# Patient Record
Sex: Female | Born: 1940 | Race: White | Hispanic: No | Smoking: Never smoker
Health system: Southern US, Community
[De-identification: ages and names within clinical notes are randomized; demographics above are authoritative.]

## PROBLEM LIST (undated history)

## (undated) DIAGNOSIS — E785 Hyperlipidemia, unspecified: Secondary | ICD-10-CM

## (undated) DIAGNOSIS — N952 Postmenopausal atrophic vaginitis: Secondary | ICD-10-CM

## (undated) DIAGNOSIS — Z86010 Personal history of colonic polyps: Secondary | ICD-10-CM

## (undated) DIAGNOSIS — D049 Carcinoma in situ of skin, unspecified: Secondary | ICD-10-CM

## (undated) DIAGNOSIS — F419 Anxiety disorder, unspecified: Secondary | ICD-10-CM

## (undated) DIAGNOSIS — M1990 Unspecified osteoarthritis, unspecified site: Secondary | ICD-10-CM

## (undated) DIAGNOSIS — M545 Low back pain: Secondary | ICD-10-CM

## (undated) DIAGNOSIS — J309 Allergic rhinitis, unspecified: Secondary | ICD-10-CM

## (undated) DIAGNOSIS — D039 Melanoma in situ, unspecified: Secondary | ICD-10-CM

## (undated) DIAGNOSIS — I10 Essential (primary) hypertension: Secondary | ICD-10-CM

## (undated) DIAGNOSIS — I639 Cerebral infarction, unspecified: Secondary | ICD-10-CM

## (undated) DIAGNOSIS — E559 Vitamin D deficiency, unspecified: Secondary | ICD-10-CM

## (undated) HISTORY — PX: BACK SURGERY: SHX140

## (undated) HISTORY — DX: Melanoma in situ, unspecified: D03.9

## (undated) HISTORY — DX: Postmenopausal atrophic vaginitis: N95.2

## (undated) HISTORY — DX: Vitamin D deficiency, unspecified: E55.9

## (undated) HISTORY — DX: Personal history of colonic polyps: Z86.010

## (undated) HISTORY — DX: Cerebral infarction, unspecified: I63.9

## (undated) HISTORY — DX: Anxiety disorder, unspecified: F41.9

## (undated) HISTORY — DX: Allergic rhinitis, unspecified: J30.9

## (undated) HISTORY — DX: Low back pain: M54.5

## (undated) HISTORY — DX: Hyperlipidemia, unspecified: E78.5

## (undated) HISTORY — DX: Carcinoma in situ of skin, unspecified: D04.9

---

## 1988-01-13 HISTORY — PX: OTHER SURGICAL HISTORY: SHX169

## 2004-01-13 HISTORY — PX: CHOLECYSTECTOMY: SHX55

## 2004-09-22 ENCOUNTER — Ambulatory Visit: Payer: Self-pay | Admitting: Family Medicine

## 2004-09-30 ENCOUNTER — Ambulatory Visit: Payer: Self-pay | Admitting: Family Medicine

## 2004-10-06 ENCOUNTER — Ambulatory Visit: Payer: Self-pay | Admitting: Internal Medicine

## 2004-11-07 ENCOUNTER — Ambulatory Visit: Payer: Self-pay | Admitting: Internal Medicine

## 2004-11-07 ENCOUNTER — Encounter (INDEPENDENT_AMBULATORY_CARE_PROVIDER_SITE_OTHER): Payer: Self-pay | Admitting: Specialist

## 2004-11-20 ENCOUNTER — Emergency Department (HOSPITAL_COMMUNITY): Admission: EM | Admit: 2004-11-20 | Discharge: 2004-11-20 | Payer: Self-pay | Admitting: Emergency Medicine

## 2004-11-24 ENCOUNTER — Ambulatory Visit: Payer: Self-pay | Admitting: Family Medicine

## 2004-12-26 ENCOUNTER — Ambulatory Visit (HOSPITAL_COMMUNITY): Admission: RE | Admit: 2004-12-26 | Discharge: 2004-12-27 | Payer: Self-pay | Admitting: General Surgery

## 2004-12-26 ENCOUNTER — Encounter (INDEPENDENT_AMBULATORY_CARE_PROVIDER_SITE_OTHER): Payer: Self-pay | Admitting: Specialist

## 2007-05-25 ENCOUNTER — Ambulatory Visit: Payer: Self-pay | Admitting: Family Medicine

## 2007-05-25 LAB — CONVERTED CEMR LAB
ALT: 23 units/L (ref 0–35)
AST: 21 units/L (ref 0–37)
Albumin: 4.6 g/dL (ref 3.5–5.2)
Alkaline Phosphatase: 92 units/L (ref 39–117)
BUN: 19 mg/dL (ref 6–23)
Basophils Absolute: 0 10*3/uL (ref 0.0–0.1)
Basophils Relative: 0.4 % (ref 0.0–1.0)
Bilirubin Urine: NEGATIVE
Bilirubin, Direct: 0.1 mg/dL (ref 0.0–0.3)
Blood in Urine, dipstick: NEGATIVE
CO2: 30 meq/L (ref 19–32)
Calcium: 9.7 mg/dL (ref 8.4–10.5)
Chloride: 109 meq/L (ref 96–112)
Cholesterol: 223 mg/dL (ref 0–200)
Creatinine, Ser: 0.7 mg/dL (ref 0.4–1.2)
Direct LDL: 150.7 mg/dL
Eosinophils Absolute: 0 10*3/uL (ref 0.0–0.7)
Eosinophils Relative: 1 % (ref 0.0–5.0)
GFR calc Af Amer: 108 mL/min
GFR calc non Af Amer: 90 mL/min
Glucose, Bld: 105 mg/dL — ABNORMAL HIGH (ref 70–99)
Glucose, Urine, Semiquant: NEGATIVE
HCT: 43.1 % (ref 36.0–46.0)
HDL: 39.5 mg/dL (ref >39.0)
Hemoglobin: 14.7 g/dL (ref 12.0–15.0)
Ketones, urine, test strip: NEGATIVE
Lymphocytes Relative: 36.4 % (ref 12.0–46.0)
MCHC: 34 g/dL (ref 30.0–36.0)
MCV: 93.7 fL (ref 78.0–100.0)
Monocytes Absolute: 0.4 10*3/uL (ref 0.1–1.0)
Monocytes Relative: 8.9 % (ref 3.0–12.0)
Neutro Abs: 2.8 10*3/uL (ref 1.4–7.7)
Neutrophils Relative %: 53.3 % (ref 43.0–77.0)
Nitrite: NEGATIVE
Platelets: 302 10*3/uL (ref 150–400)
Potassium: 4.5 meq/L (ref 3.5–5.1)
RBC: 4.6 M/uL (ref 3.87–5.11)
RDW: 12.2 % (ref 11.5–14.6)
Sodium: 144 meq/L (ref 135–145)
Specific Gravity, Urine: 1.02
TSH: 3.55 microintl units/mL (ref 0.35–5.50)
Total Bilirubin: 1.1 mg/dL (ref 0.3–1.2)
Total CHOL/HDL Ratio: 5.6
Total Protein: 8 g/dL (ref 6.0–8.3)
Triglycerides: 86 mg/dL (ref 0–149)
Urobilinogen, UA: 0.2
VLDL: 17 mg/dL (ref 0–40)
WBC: 5 10*3/uL (ref 4.5–10.5)
pH: 6.5

## 2007-06-03 ENCOUNTER — Ambulatory Visit: Payer: Self-pay | Admitting: Family Medicine

## 2007-06-03 DIAGNOSIS — J309 Allergic rhinitis, unspecified: Secondary | ICD-10-CM

## 2007-06-03 DIAGNOSIS — E782 Mixed hyperlipidemia: Secondary | ICD-10-CM

## 2007-06-03 DIAGNOSIS — N952 Postmenopausal atrophic vaginitis: Secondary | ICD-10-CM | POA: Insufficient documentation

## 2007-06-03 DIAGNOSIS — L258 Unspecified contact dermatitis due to other agents: Secondary | ICD-10-CM

## 2007-06-03 DIAGNOSIS — Z86010 Personal history of colonic polyps: Secondary | ICD-10-CM

## 2007-06-03 HISTORY — DX: Allergic rhinitis, unspecified: J30.9

## 2009-07-04 ENCOUNTER — Ambulatory Visit: Payer: Self-pay | Admitting: Family Medicine

## 2009-07-04 DIAGNOSIS — T148 Other injury of unspecified body region: Secondary | ICD-10-CM

## 2009-07-04 DIAGNOSIS — W57XXXA Bitten or stung by nonvenomous insect and other nonvenomous arthropods, initial encounter: Secondary | ICD-10-CM

## 2009-08-23 ENCOUNTER — Ambulatory Visit: Payer: Self-pay | Admitting: Family Medicine

## 2009-08-23 LAB — CONVERTED CEMR LAB
Bilirubin Urine: NEGATIVE
Blood in Urine, dipstick: NEGATIVE
Glucose, Urine, Semiquant: NEGATIVE
Nitrite: NEGATIVE
Protein, U semiquant: NEGATIVE
Specific Gravity, Urine: 1.025
Urobilinogen, UA: 0.2
WBC Urine, dipstick: NEGATIVE
pH: 6

## 2009-08-27 LAB — CONVERTED CEMR LAB
ALT: 21 units/L (ref 0–35)
AST: 23 units/L (ref 0–37)
Albumin: 4.4 g/dL (ref 3.5–5.2)
Alkaline Phosphatase: 83 units/L (ref 39–117)
BUN: 22 mg/dL (ref 6–23)
Basophils Absolute: 0 10*3/uL (ref 0.0–0.1)
Basophils Relative: 0.2 % (ref 0.0–3.0)
Bilirubin, Direct: 0.1 mg/dL (ref 0.0–0.3)
CO2: 28 meq/L (ref 19–32)
Calcium: 9.3 mg/dL (ref 8.4–10.5)
Chloride: 106 meq/L (ref 96–112)
Cholesterol: 221 mg/dL — ABNORMAL HIGH (ref 0–200)
Creatinine, Ser: 0.6 mg/dL (ref 0.4–1.2)
Direct LDL: 155.5 mg/dL
Eosinophils Absolute: 0 10*3/uL (ref 0.0–0.7)
Eosinophils Relative: 0.3 % (ref 0.0–5.0)
GFR calc non Af Amer: 98.4 mL/min (ref >60)
Glucose, Bld: 100 mg/dL — ABNORMAL HIGH (ref 70–99)
HCT: 41.6 % (ref 36.0–46.0)
HDL: 53.2 mg/dL (ref >39.00)
Hemoglobin: 14.5 g/dL (ref 12.0–15.0)
Lymphocytes Relative: 17.3 % (ref 12.0–46.0)
Lymphs Abs: 1.1 10*3/uL (ref 0.7–4.0)
MCHC: 34.9 g/dL (ref 30.0–36.0)
MCV: 94.2 fL (ref 78.0–100.0)
Monocytes Absolute: 0.4 10*3/uL (ref 0.1–1.0)
Monocytes Relative: 6.9 % (ref 3.0–12.0)
Neutro Abs: 4.7 10*3/uL (ref 1.4–7.7)
Neutrophils Relative %: 75.3 % (ref 43.0–77.0)
Platelets: 251 10*3/uL (ref 150.0–400.0)
Potassium: 4.1 meq/L (ref 3.5–5.1)
RBC: 4.41 M/uL (ref 3.87–5.11)
RDW: 13.3 % (ref 11.5–14.6)
Sodium: 143 meq/L (ref 135–145)
TSH: 3.28 microintl units/mL (ref 0.35–5.50)
Total Bilirubin: 0.9 mg/dL (ref 0.3–1.2)
Total CHOL/HDL Ratio: 4
Total Protein: 7.2 g/dL (ref 6.0–8.3)
Triglycerides: 74 mg/dL (ref 0.0–149.0)
VLDL: 14.8 mg/dL (ref 0.0–40.0)
WBC: 6.3 10*3/uL (ref 4.5–10.5)

## 2009-08-29 ENCOUNTER — Encounter: Admission: RE | Admit: 2009-08-29 | Discharge: 2009-08-29 | Payer: Self-pay | Admitting: Family Medicine

## 2009-09-09 ENCOUNTER — Ambulatory Visit: Payer: Self-pay | Admitting: Family Medicine

## 2009-09-09 DIAGNOSIS — H900 Conductive hearing loss, bilateral: Secondary | ICD-10-CM

## 2010-02-11 NOTE — Assessment & Plan Note (Signed)
Summary: Insect bite/cb   Vital Signs:  Patient profile:   70 year old female Weight:      132 pounds Temp:     98.1 degrees F oral BP sitting:   180 / 90  (left arm) Cuff size:   regular  Vitals Entered By: Kathrynn Speed CMA (July 04, 2009 1:48 PM) CC: Spider bite yester day on her chest   CC:  Spider bite yester day on her chest.  History of Present Illness: Kainat is a 70 year old female, who comes in today for evaluation of a bug bite.  She states that yesterday she was walking at her back door and noticed a area of erythema and right mid chest.  Painless.  Current Medications (verified): 1)  Premarin 0.625 Mg/gm  Crea (Estrogens, Conjugated) .... Apply  2 X Week  Allergies (verified): No Known Drug Allergies  Social History: Reviewed history from 06/03/2007 and no changes required. 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  No secondary infection   Impression & Recommendations:  Problem # 1:  INSECT BITE (ICD-919.4) Assessment New  Complete Medication List: 1)  Premarin 0.625 Mg/gm Crea (Estrogens, conjugated) .... Apply  2 x week  Patient Instructions: 1)  Please schedule a follow-up appointment as needed.

## 2010-02-11 NOTE — Assessment & Plan Note (Signed)
Summary: CPX - pt will come in fasting/cb   Vital Signs:  Patient profile:   70 year old female Height:      58.75 inches Weight:      132 pounds BMI:     26.99 Temp:     97.9 degrees F oral BP sitting:   140 / 90  (left arm) Cuff size:   regular  Vitals Entered By: Kathrynn Speed CMA (August 23, 2009 8:23 AM) CC: cpx, fasting, pap, src Is Patient Diabetic? No   CC:  cpx, fasting, pap, and src.  History of Present Illness: [name] is a 70 year old female, nonsmoker, who comes in today for general physical examination  She is always been in excellent health, except she said, chronic vaginal dryness.  She supposed to be using Premarinvaginal cream twice weekly, but she is not.  She had a skin cancer......... basal cell carcinoma.........Marland Kitchen removed from her anterior chest wall.  Last year by Dr. Irene Limbo.she gets routine eye care not to dental care.  She does not check her breasts monthly.  Last mammogram was two years ago.  Tetanus 2009 declined seasonal flu shot.  Colonoscopy 3 years ago, showed a polyp.  She is on the recall list.  Here for Medicare AWV:  1.   Risk factors based on Past M, S, F history:.Marland KitchenMarland KitchenMarland Kitchenreviewed.  No change except for skin cancer as noted above 2.   Physical Activities: walks daily 3.   Depression/mood: good mood.  No depression 4.   Hearing: normal 5.   ADL's: reviewed.  No change 6.   Fall Risk: reviewed.  No change 7.   Home Safety: reviewed.  No change 8.   Height, weight, &visual acuity:height weight same annual eye exam by ophthalmologist 9.   Counseling: continued walking and sunscreens SPF 50 10.   Labs ordered based on risk factors: done today 11.           Referral Coordination,,,,,,,none indicated 12.           Care Plan,,,,,,,,,reviewed care plan 13.            Cognitive Assessment .......normal mentation  Preventive Screening-Counseling & Management  Alcohol-Tobacco     Smoking Status: never  Current Medications (verified): 1)  Premarin  0.625 Mg/gm  Crea (Estrogens, Conjugated) .... Apply  2 X Week  Allergies (verified): No Known Drug Allergies  Past History:  Past medical, surgical, family and social histories (including risk factors) reviewed, and no changes noted (except as noted below).  Past Medical History: Reviewed history from 06/03/2007 and no changes required. Allergic rhinitis Hyperlipidemia eczema TAH for prolapse.  No cancer childbirth x 2cholecystectomy Colonic polyps, hx of  Family History: Reviewed history from 06/03/2007 and no changes required. father died of a stroke at age 51 mother died of stroke, diabetes, hypertension brother in good health Miss sisters  Social History: Reviewed history from 06/03/2007 and no changes required. 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 No apparent alopecia or balding. Eyes:  No corneal or conjunctival inflammation noted. EOMI. Perrla. Funduscopic exam benign, without hemorrhages, exudates or papilledema. Vision grossly normal. Ears:  External ear exam shows no significant lesions or deformities.  Otoscopic examination reveals clear canals, tympanic membranes are  intact bilaterally without bulging, retraction, inflammation or discharge. Hearing is grossly normal bilaterally. Nose:  External nasal examination shows no deformity or inflammation. Nasal mucosa are pink and moist without lesions or exudates. Mouth:  Oral mucosa and oropharynx without lesions or exudates.  Teeth in good repair. Neck:  No deformities, masses, or tenderness noted. Chest Wall:  No deformities, masses, or tenderness noted. Breasts:  No mass,  nodules, thickening, tenderness, bulging, retraction, inflamation, nipple discharge or skin changes noted.   Lungs:  Normal respiratory effort, chest expands symmetrically. Lungs are clear to auscultation, no crackles or wheezes. Heart:  Normal rate and regular rhythm. S1 and S2 normal without gallop, murmur, click, rub or other extra sounds. Abdomen:  Bowel sounds positive,abdomen soft and non-tender without masses, organomegaly or hernias noted. Rectal:  No external abnormalities noted. Normal sphincter tone. No rectal masses or tenderness. Genitalia:  Pelvic Exam:        External: normal female genitalia without lesions or masses        Vagina: normal without lesions or masses        Cervix: normal without lesions or masses        Adnexa: normal bimanual exam without masses or fullness        Uterus: normal by palpation        Pap smear: not performed Msk:  No deformity or scoliosis noted of thoracic or lumbar spine.   Pulses:  R and L carotid,radial,femoral,dorsalis pedis and posterior tibial pulses are full and equal bilaterally Extremities:  No clubbing, cyanosis, edema, or deformity noted with normal full range of motion of all joints.   Neurologic:  No cranial nerve deficits noted. Station and gait are normal. Plantar reflexes are down-going bilaterally. DTRs are symmetrical throughout. Sensory, motor and coordinative functions appear intact. Skin:  total body skin exam normal except for a healing scar anterior chest wall Cervical Nodes:  No lymphadenopathy noted Axillary Nodes:  No palpable lymphadenopathy Inguinal Nodes:  No significant adenopathy Psych:  Cognition and judgment appear intact. Alert and cooperative with normal attention span and concentration. No apparent delusions, illusions, hallucinations   Impression & Recommendations:  Problem # 1:  VAGINITIS, ATROPHIC (ICD-627.3) Assessment Deteriorated  Her updated medication list for this problem includes:    Premarin  0.625 Mg/gm Crea (Estrogens, conjugated) .Marland Kitchen... Apply  2 x week  Orders: Venipuncture (44010) TLB-Lipid Panel (80061-LIPID) TLB-BMP (Basic Metabolic Panel-BMET) (80048-METABOL) TLB-CBC Platelet - w/Differential (85025-CBCD) TLB-Hepatic/Liver Function Pnl (80076-HEPATIC) TLB-TSH (Thyroid Stimulating Hormone) (27253-GUY) Prescription Created Electronically [phone]) Medicare -1st Annual Wellness Visit [phone]) Urinalysis-dipstick only (Medicare patient) (63875IE)  Problem # 2:  Preventive Health Care (ICD-V70.0) Assessment: Unchanged  Complete Medication List: 1)  Premarin 0.625 Mg/gm Crea (Estrogens, conjugated) .... Apply  2 x week  Other Orders: Specimen Handling (33295) EKG w/ Interpretation (93000)  Patient Instructions: 1)  Schedule your mammogram. 2)  Schedule a colonoscopy/sigmoidoscopy to help detect colon cancer. 3)  Take calcium +Vitamin D daily. 4)  Take an Aspirin every day. 5)  remembered to check your breasts monthly given.  Annual mammogram and a flu shot.  We will give you the information on the shingles.  Vaccine. 6)  Please schedule a follow-up appointment in 1 year. 7)  be sure to wear sunscreens SPF 50+ in a baseball cap Prescriptions: PREMARIN 0.625 MG/GM  CREA (ESTROGENS, CONJUGATED) apply  2 x week  #3 tubes x 6   Entered and Authorized by:   Roderick Pee MD   Signed by:  Roderick Pee MD on 08/23/2009   Method used:   Electronically to        [facility]  [phone]* (retail)       [address] [place], [place]  [address]       Ph: [phone] or [phone]       Fax: [phone]   RxID:   [id number]    Laboratory Results   Urine Tests    Routine Urinalysis   Color: yellow Appearance: Clear Glucose: negative   (Normal Range: Negative) Bilirubin: negative   (Normal Range: Negative) Ketone: trace (5)   (Normal Range: Negative) Spec. Gravity: 1.025   (Normal Range: 1.003-1.035) Blood: negative   (Normal Range:  Negative) pH: 6.0   (Normal Range: 5.0-8.0) Protein: negative   (Normal Range: Negative) Urobilinogen: 0.2   (Normal Range: 0-1) Nitrite: negative   (Normal Range: Negative) Leukocyte Esterace: negative   (Normal Range: Negative)    Comments: Rita Ohara  August 23, 2009 11:02 AM

## 2010-02-11 NOTE — Assessment & Plan Note (Signed)
Summary: EAR CHECK//CCM   Vital Signs:  Patient profile:   70 year old female Weight:      134 pounds Temp:     98.3 degrees F oral BP sitting:   140 / 90  (left arm) Cuff size:   regular  Vitals Entered By: Kern Reap CMA Duncan Dull) (September 09, 2009 9:58 AM) CC: clogged ears Flu Vaccine Consent Questions     Do you have a history of severe allergic reactions to this vaccine? no    Any prior history of allergic reactions to egg and/or gelatin? no    Do you have a sensitivity to the preservative Thimersol? no    Do you have a past history of Guillan-Barre Syndrome? no    Do you currently have an acute febrile illness? no    Have you ever had a severe reaction to latex? no    Vaccine information given and explained to patient? yes    Are you currently pregnant? no    Lot Number:[id number]   Exp Date:07/12/2010   Site Given  Left Deltoid IM   CC:  clogged ears.  History of Present Illness: [name] is a 70 year old female, who comes in today for evaluation of bilateral hearing loss.  She's got a history of wax build up.  Allergies: No Known Drug Allergies  Past History:  Past medical, surgical, family and social histories (including risk factors) reviewed for relevance to current acute and chronic problems.  Past Medical History: Reviewed history from 06/03/2007 and no changes required. Allergic rhinitis Hyperlipidemia eczema TAH for prolapse.  No cancer childbirth x 2cholecystectomy Colonic polyps, hx of  Family History: Reviewed history from 06/03/2007 and no changes required. father died of a stroke at age 54 mother died of stroke, diabetes, hypertension brother in good health Miss sisters  Social History: Reviewed history from 06/03/2007 and no changes required. 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 Apply  2 x week  Other Orders: Flu Vaccine 59yrs + (04540) Administration Flu vaccine - MCR (J8119)  Patient Instructions: 1)  Please schedule a follow-up appointment as needed.

## 2010-05-19 ENCOUNTER — Other Ambulatory Visit: Payer: Self-pay | Admitting: Dermatology

## 2010-05-30 NOTE — Op Note (Signed)
[name], [name]                 ACCOUNT NO.:  [id number]   MEDICAL RECORD NO.:  [id number]          PATIENT TYPE:  OIB   LOCATION:  1607                         FACILITY[facility]  [facility]   PHYSICIAN:  Angelia Mould. Derrell Lolling, M.D.DATE OF BIRTH:  10/01/1940   DATE OF PROCEDURE:  DATE OF DISCHARGE:  12/27/2004                                 OPERATIVE REPORT   PREOPERATIVE DIAGNOSIS:  Chronic cholecystitis with cholelithiasis.   POSTOPERATIVE DIAGNOSIS:  Chronic cholecystitis with cholelithiasis.   OPERATION PERFORMED:  Laparoscopic cholecystectomy with intraoperative  cholangiogram.   SURGEON:  Angelia Mould. Derrell Lolling, M.D.   FIRST ASSISTANT:  Wilmon Arms. Tsuei, M.D.   OPERATIVE INDICATIONS:  This is a 70 year old white female who has had an  episode of biliary colic 6 weeks ago. She has not had a recurrence. One set  of lab work showed a mild elevation of alkaline phosphatase and SGPT. That  has normalized. Abdominal ultrasound showed multiple gallstones, common bile  duct was 9-10 mm but no stones were visualized in the common bile duct. She  had been evaluated as an outpatient. She is brought to operating room  electively.   OPERATIVE FINDINGS:  The gallbladder was chronically inflamed and had  moderately severe adhesions to it but these were chronic adhesions, nothing  acute. The common bile duct was a significantly dilated but there was no  filling defect on cholangiography. A cholangiogram showed normal  intrahepatic and extrahepatic biliary anatomy other than the extrahepatic  dilatation, no filling defect, no obstruction with good flow of contrast in  the duodenum. The small bowel, large bowel, stomach, duodenum and liver  otherwise looked normal.   OPERATIVE TECHNIQUE:  Following the induction of general endotracheal  anesthesia, the patient's abdomen was prepped and draped in a sterile  fashion. 0.5% Marcaine with epinephrine was used as a local infiltration  anesthetic. A vertically  oriented incision was made at the lower rim of the  umbilicus. The fascia was incised in the midline and the abdominal cavity  entered under direct vision. A 10 mm Hasson trocar was inserted and secured  with a pursestring suture of #0 Vicryl. Pneumoperitoneum was created. The  video camera was inserted with visualization and findings as described  above. A 10 mm trocar was placed in the subxiphoid region and two 5 mm  trocars placed in the right abdomen. The gallbladder fundus was identified.  This was identified and elevated. Adhesions were taken down until we had  completely dissected out the infundibulum. The common bile duct was  noticeably large. The hepatic artery was very prominent and seemed enlarged  as well but was in a normal location. We very carefully dissected out the  peritoneum around the infundibulum of the gallbladder. We isolated the  cystic artery, secured it with metal clips and divided it. I very carefully  dissected behind the cystic duct and created a large window so that I could  see very clearly the anatomy. I then placed a cholangiogram catheter into  the cystic duct and performed a cholangiogram using the C-arm. The  cholangiogram showed a dilated  common bile duct but otherwise normal  extrahepatic and intrahepatic biliary anatomy. The cholangiogram also showed  no filling defects and good flow of contrast into the duodenum. The  cholangiogram catheter was removed. The cystic duct was secured with  multiple metal clips and divided. The gallbladder was dissected from its bed  with electrocautery and removed through the umbilical port. The operative  field was copiously irrigated. At the completion of the case, there was no  bleeding and no bile leak whatsoever and the irrigation fluid was completely  clear. The trocars were removed under direct vision and there was no  bleeding from trocar sites. The pneumoperitoneum was released. The fascia at  the umbilicus was  closed with #0 Vicryl sutures. The skin incisions were  closed with subcuticular sutures of 4-0 Monocryl and Steri-Strips. Clean  bandages were placed and the patient taken to the recovery room in stable  condition. Estimated blood loss was about 10 mL. Complications none. Sponge,  needle and instrument counts were correct.      Angelia Mould. Derrell Lolling, M.D.  Electronically Signed     HMI/MEDQ  D:  12/26/2004  T:  12/29/2004  Job:  [id number]   cc:   Celene Kras, MD  Fax: [phone]   Eugenio Hoes. Tawanna Cooler, M.D. [facility]  [address] [place]  [place] [address]

## 2010-09-29 ENCOUNTER — Other Ambulatory Visit: Payer: Self-pay | Admitting: Family Medicine

## 2010-09-29 DIAGNOSIS — Z1231 Encounter for screening mammogram for malignant neoplasm of breast: Secondary | ICD-10-CM

## 2010-10-24 ENCOUNTER — Ambulatory Visit: Payer: Self-pay

## 2010-10-27 ENCOUNTER — Ambulatory Visit
Admission: RE | Admit: 2010-10-27 | Discharge: 2010-10-27 | Disposition: A | Discharge status: Home / Self Care | Payer: Medicare Other | Source: Ambulatory Visit | Attending: Family Medicine | Admitting: Family Medicine

## 2010-10-27 DIAGNOSIS — Z1231 Encounter for screening mammogram for malignant neoplasm of breast: Secondary | ICD-10-CM

## 2010-11-24 ENCOUNTER — Other Ambulatory Visit: Payer: Self-pay | Admitting: Dermatology

## 2011-10-16 ENCOUNTER — Other Ambulatory Visit: Payer: Self-pay | Admitting: Family Medicine

## 2011-10-16 DIAGNOSIS — Z1231 Encounter for screening mammogram for malignant neoplasm of breast: Secondary | ICD-10-CM

## 2011-11-20 ENCOUNTER — Ambulatory Visit
Admission: RE | Admit: 2011-11-20 | Discharge: 2011-11-20 | Disposition: A | Discharge status: Home / Self Care | Payer: Medicare Other | Source: Ambulatory Visit | Attending: Family Medicine | Admitting: Family Medicine

## 2011-11-20 DIAGNOSIS — Z1231 Encounter for screening mammogram for malignant neoplasm of breast: Secondary | ICD-10-CM

## 2011-11-23 ENCOUNTER — Other Ambulatory Visit: Payer: Self-pay | Admitting: Dermatology

## 2011-12-02 ENCOUNTER — Other Ambulatory Visit: Payer: Self-pay | Admitting: Dermatology

## 2012-04-18 ENCOUNTER — Other Ambulatory Visit: Payer: Self-pay | Admitting: Dermatology

## 2012-10-31 ENCOUNTER — Other Ambulatory Visit: Payer: Self-pay

## 2012-10-31 DIAGNOSIS — Z1231 Encounter for screening mammogram for malignant neoplasm of breast: Secondary | ICD-10-CM

## 2012-11-28 ENCOUNTER — Ambulatory Visit
Admission: RE | Admit: 2012-11-28 | Discharge: 2012-11-28 | Disposition: A | Discharge status: Home / Self Care | Payer: Medicare Other | Source: Ambulatory Visit

## 2012-11-28 DIAGNOSIS — Z1231 Encounter for screening mammogram for malignant neoplasm of breast: Secondary | ICD-10-CM

## 2013-11-06 ENCOUNTER — Other Ambulatory Visit: Payer: Self-pay

## 2013-11-06 DIAGNOSIS — Z1239 Encounter for other screening for malignant neoplasm of breast: Secondary | ICD-10-CM

## 2013-11-20 ENCOUNTER — Other Ambulatory Visit: Payer: Self-pay | Admitting: Dermatology

## 2013-11-30 ENCOUNTER — Other Ambulatory Visit: Payer: Self-pay

## 2013-11-30 DIAGNOSIS — Z1231 Encounter for screening mammogram for malignant neoplasm of breast: Secondary | ICD-10-CM

## 2013-12-01 ENCOUNTER — Ambulatory Visit
Admission: RE | Admit: 2013-12-01 | Discharge: 2013-12-01 | Disposition: A | Discharge status: Home / Self Care | Payer: Medicare Other | Source: Ambulatory Visit

## 2013-12-01 DIAGNOSIS — Z1231 Encounter for screening mammogram for malignant neoplasm of breast: Secondary | ICD-10-CM

## 2014-04-02 ENCOUNTER — Telehealth: Payer: Self-pay | Admitting: Family Medicine

## 2014-04-02 NOTE — Telephone Encounter (Signed)
Pt has not seen dr todd since around 2011. Pt is having pressure in vaginal area and shoulder pain. Pt is aware md out of office for 2 wks. Pt would like to re-est with md. Can I sch?

## 2014-04-02 NOTE — Telephone Encounter (Signed)
Pt will check with some one else to see what she can do

## 2014-11-09 ENCOUNTER — Other Ambulatory Visit: Payer: Self-pay

## 2014-11-09 DIAGNOSIS — Z1231 Encounter for screening mammogram for malignant neoplasm of breast: Secondary | ICD-10-CM

## 2014-12-12 ENCOUNTER — Ambulatory Visit
Admission: RE | Admit: 2014-12-12 | Discharge: 2014-12-12 | Disposition: A | Discharge status: Home / Self Care | Payer: Medicare Other | Source: Ambulatory Visit

## 2014-12-12 DIAGNOSIS — Z1231 Encounter for screening mammogram for malignant neoplasm of breast: Secondary | ICD-10-CM

## 2015-05-02 LAB — HM DEXA SCAN

## 2016-03-26 LAB — PULMONARY FUNCTION TEST

## 2016-05-15 ENCOUNTER — Other Ambulatory Visit: Payer: Self-pay | Admitting: Neurosurgery

## 2016-05-25 ENCOUNTER — Encounter (HOSPITAL_COMMUNITY): Payer: Self-pay

## 2016-05-25 ENCOUNTER — Encounter (HOSPITAL_COMMUNITY)
Admission: RE | Admit: 2016-05-25 | Discharge: 2016-05-25 | Disposition: A | Discharge status: Home / Self Care | Payer: Medicare Other | Source: Ambulatory Visit | Attending: Neurosurgery | Admitting: Neurosurgery

## 2016-05-25 DIAGNOSIS — Z0183 Encounter for blood typing: Secondary | ICD-10-CM | POA: Insufficient documentation

## 2016-05-25 DIAGNOSIS — Z01812 Encounter for preprocedural laboratory examination: Secondary | ICD-10-CM | POA: Insufficient documentation

## 2016-05-25 DIAGNOSIS — M4316 Spondylolisthesis, lumbar region: Secondary | ICD-10-CM | POA: Diagnosis not present

## 2016-05-25 HISTORY — DX: Essential (primary) hypertension: I10

## 2016-05-25 HISTORY — DX: Unspecified osteoarthritis, unspecified site: M19.90

## 2016-05-25 LAB — CBC WITH DIFFERENTIAL/PLATELET
Basophils Absolute: 0 10*3/uL (ref 0.0–0.1)
Basophils Relative: 0 %
Eosinophils Absolute: 0.1 10*3/uL (ref 0.0–0.7)
Eosinophils Relative: 1 %
HEMATOCRIT: 42.4 % (ref 36.0–46.0)
HEMOGLOBIN: 14.3 g/dL (ref 12.0–15.0)
LYMPHS ABS: 2.1 10*3/uL (ref 0.7–4.0)
Lymphocytes Relative: 26 %
MCH: 31 pg (ref 26.0–34.0)
MCHC: 33.7 g/dL (ref 30.0–36.0)
MCV: 91.8 fL (ref 78.0–100.0)
MONOS PCT: 6 %
Monocytes Absolute: 0.5 10*3/uL (ref 0.1–1.0)
NEUTROS ABS: 5.4 10*3/uL (ref 1.7–7.7)
NEUTROS PCT: 67 %
Platelets: 282 10*3/uL (ref 150–400)
RBC: 4.62 MIL/uL (ref 3.87–5.11)
RDW: 13.4 % (ref 11.5–15.5)
WBC: 8.1 10*3/uL (ref 4.0–10.5)

## 2016-05-25 LAB — BASIC METABOLIC PANEL
ANION GAP: 10 (ref 5–15)
BUN: 23 mg/dL — ABNORMAL HIGH (ref 6–20)
CHLORIDE: 102 mmol/L (ref 101–111)
CO2: 26 mmol/L (ref 22–32)
Calcium: 10 mg/dL (ref 8.9–10.3)
Creatinine, Ser: 0.82 mg/dL (ref 0.44–1.00)
GFR calc non Af Amer: >60 mL/min (ref >60)
GLUCOSE: 102 mg/dL — AB (ref 65–99)
Potassium: 3.8 mmol/L (ref 3.5–5.1)
Sodium: 138 mmol/L (ref 135–145)

## 2016-05-25 LAB — SURGICAL PCR SCREEN
MRSA, PCR: NEGATIVE
Staphylococcus aureus: NEGATIVE

## 2016-05-25 LAB — ABO/RH: ABO/RH(D): A POS

## 2016-05-25 LAB — TYPE AND SCREEN
ABO/RH(D): A POS
Antibody Screen: NEGATIVE

## 2016-05-25 NOTE — Pre-Procedure Instructions (Addendum)
Nolon Nationsnna K Slinker  05/25/2016      [facility] [phone] [place] - [address] [facility] [address] [address] & [facility] [address] [place] [id number] Phone: [phone] Fax: [phone]    Your procedure is scheduled on 06/01/16  [facility] at 600 A.M.  Call this number if you have problems the morning of surgery:  [phone]   Remember:  Do not eat food or drink liquids after midnight.  Take these medicines the morning of surgery with A SIP OF WATER      Tramadol if needed for pain  STOP all herbel meds, nsaids (aleve,naproxen,advil,ibuprofen) 7 days prior to surgery starting today 05/25/16 including all vitamins/supplements,aspirin,   Do not wear jewelry, make-up or nail polish.  Do not wear lotions, powders, or perfumes, or deoderant.  Do not shave 48 hours prior to surgery.  Men may shave face and neck.  Do not bring valuables to the hospital.  [facility] is not responsible for any belongings or valuables.  Contacts, dentures or bridgework may not be worn into surgery.  Leave your suitcase in the car.  After surgery it may be brought to your room.  For patients admitted to the hospital, discharge time will be determined by your treatment team.  Patients discharged the day of surgery will not be allowed to drive home.   Special instructions:  * Special Instructions: [place] - Preparing for Surgery  Before surgery, you can play an important role.  Because skin is not sterile, your skin needs to be as free of germs as possible.  You can reduce the number of germs on you skin by washing with CHG (chlorahexidine gluconate) soap before surgery.  CHG is an antiseptic cleaner which kills germs and bonds with the skin to continue killing germs even after washing.  Please DO NOT use if you have an allergy to CHG or antibacterial soaps.  If your skin becomes reddened/irritated stop using the CHG and inform your nurse  when you arrive at Short Stay.  Do not shave (including legs and underarms) for at least 48 hours prior to the first CHG shower.  You may shave your face.  Please follow these instructions carefully:   1.  Shower with CHG Soap the night before surgery and the morning of Surgery.  2.  If you choose to wash your hair, wash your hair first as usual with your normal shampoo.  3.  After you shampoo, rinse your hair and body thoroughly to remove the Shampoo.  4.  Use CHG as you would any other liquid soap.  You can apply chg directly  to the skin and wash gently with scrungie or a clean washcloth.  5.  Apply the CHG Soap to your body ONLY FROM THE NECK DOWN.  Do not use on open wounds or open sores.  Avoid contact with your eyes ears, mouth and genitals (private parts).  Wash genitals (private parts)       with your normal soap.  6.  Wash thoroughly, paying special attention to the area where your surgery will be performed.  7.  Thoroughly rinse your body with warm water from the neck down.  8.  DO NOT shower/wash with your normal soap after using and rinsing off the CHG Soap.  9.  Pat yourself dry with a clean towel.            10.  Wear clean  pajamas.            11.  Place clean sheets on your bed the night of your first shower and do not sleep with pets.  Day of Surgery  Do not apply any lotions/deodorants the morning of surgery.  Please wear clean clothes to the hospital/surgery center.  Please read over the  fact sheets that you were given.

## 2016-05-26 NOTE — Progress Notes (Signed)
[facility]

## 2016-06-01 ENCOUNTER — Inpatient Hospital Stay (HOSPITAL_COMMUNITY)
Admission: RE | Admit: 2016-06-01 | Discharge: 2016-06-03 | DRG: 455 | Disposition: A | Discharge status: Home / Self Care | Payer: Medicare Other | Source: Ambulatory Visit | Attending: Neurosurgery | Admitting: Neurosurgery

## 2016-06-01 ENCOUNTER — Inpatient Hospital Stay (HOSPITAL_COMMUNITY)
Admission: RE | Disposition: A | Discharge status: Home / Self Care | Payer: Self-pay | Source: Ambulatory Visit | Attending: Neurosurgery

## 2016-06-01 ENCOUNTER — Inpatient Hospital Stay (HOSPITAL_COMMUNITY): Payer: Medicare Other | Admitting: Anesthesiology

## 2016-06-01 ENCOUNTER — Inpatient Hospital Stay (HOSPITAL_COMMUNITY): Payer: Medicare Other

## 2016-06-01 ENCOUNTER — Encounter (HOSPITAL_COMMUNITY): Payer: Self-pay | Admitting: Surgery

## 2016-06-01 DIAGNOSIS — Z888 Allergy status to other drugs, medicaments and biological substances status: Secondary | ICD-10-CM

## 2016-06-01 DIAGNOSIS — M4310 Spondylolisthesis, site unspecified: Secondary | ICD-10-CM | POA: Diagnosis present

## 2016-06-01 DIAGNOSIS — M4316 Spondylolisthesis, lumbar region: Secondary | ICD-10-CM | POA: Diagnosis present

## 2016-06-01 DIAGNOSIS — M1990 Unspecified osteoarthritis, unspecified site: Secondary | ICD-10-CM | POA: Diagnosis present

## 2016-06-01 DIAGNOSIS — M5136 Other intervertebral disc degeneration, lumbar region: Secondary | ICD-10-CM | POA: Diagnosis present

## 2016-06-01 DIAGNOSIS — M4186 Other forms of scoliosis, lumbar region: Secondary | ICD-10-CM | POA: Diagnosis present

## 2016-06-01 DIAGNOSIS — I10 Essential (primary) hypertension: Secondary | ICD-10-CM | POA: Diagnosis present

## 2016-06-01 DIAGNOSIS — Z419 Encounter for procedure for purposes other than remedying health state, unspecified: Secondary | ICD-10-CM

## 2016-06-01 DIAGNOSIS — M48062 Spinal stenosis, lumbar region with neurogenic claudication: Principal | ICD-10-CM | POA: Diagnosis present

## 2016-06-01 SURGERY — POSTERIOR LUMBAR FUSION 3 LEVEL
Anesthesia: General | Site: Back

## 2016-06-01 MED ORDER — SERTRALINE HCL 50 MG PO TABS
50.0000 mg | ORAL_TABLET | Freq: Every evening | ORAL | Status: DC
  Administered 2016-06-01 – 2016-06-02 (×2): 50 mg via ORAL
  Filled 2016-06-01 (×2): qty 1

## 2016-06-01 MED ORDER — ONDANSETRON HCL 4 MG/2ML IJ SOLN
INTRAMUSCULAR | Status: DC | PRN
  Administered 2016-06-01: 4 mg via INTRAVENOUS

## 2016-06-01 MED ORDER — PHENOL 1.4 % MT LIQD: 1.0000 | OROMUCOSAL | Status: DC | PRN

## 2016-06-01 MED ORDER — PHENYLEPHRINE HCL 10 MG/ML IJ SOLN
INTRAMUSCULAR | Status: DC | PRN
  Administered 2016-06-01: 160 ug via INTRAVENOUS
  Administered 2016-06-01: 80 ug via INTRAVENOUS
  Administered 2016-06-01: 120 ug via INTRAVENOUS

## 2016-06-01 MED ORDER — CEFAZOLIN SODIUM-DEXTROSE 1-4 GM/50ML-% IV SOLN
1.0000 g | Freq: Three times a day (TID) | INTRAVENOUS | Status: AC
  Administered 2016-06-01 (×2): 1 g via INTRAVENOUS
  Filled 2016-06-01 (×2): qty 50

## 2016-06-01 MED ORDER — LACTATED RINGERS IV SOLN
INTRAVENOUS | Status: DC
  Administered 2016-06-01 (×3): via INTRAVENOUS

## 2016-06-01 MED ORDER — SODIUM CHLORIDE 0.9 % IV SOLN: 250.0000 mL | INTRAVENOUS | Status: DC

## 2016-06-01 MED ORDER — DEXAMETHASONE SODIUM PHOSPHATE 10 MG/ML IJ SOLN
10.0000 mg | INTRAMUSCULAR | Status: AC
  Administered 2016-06-01: 10 mg via INTRAVENOUS
  Filled 2016-06-01: qty 1

## 2016-06-01 MED ORDER — HYDROCODONE-ACETAMINOPHEN 10-325 MG PO TABS
1.0000 | ORAL_TABLET | ORAL | Status: DC | PRN
  Administered 2016-06-01 – 2016-06-03 (×9): 2 via ORAL
  Filled 2016-06-01 (×9): qty 2

## 2016-06-01 MED ORDER — METOCLOPRAMIDE HCL 5 MG/ML IJ SOLN: 10.0000 mg | Freq: Once | INTRAMUSCULAR | Status: DC | PRN

## 2016-06-01 MED ORDER — ROCURONIUM BROMIDE 100 MG/10ML IV SOLN
INTRAVENOUS | Status: DC | PRN
  Administered 2016-06-01: 20 mg via INTRAVENOUS
  Administered 2016-06-01: 50 mg via INTRAVENOUS

## 2016-06-01 MED ORDER — SODIUM CHLORIDE 0.9% FLUSH: 3.0000 mL | INTRAVENOUS | Status: DC | PRN

## 2016-06-01 MED ORDER — ROCURONIUM BROMIDE 10 MG/ML (PF) SYRINGE
PREFILLED_SYRINGE | INTRAVENOUS | Status: AC
  Filled 2016-06-01: qty 5

## 2016-06-01 MED ORDER — THROMBIN 20000 UNITS EX SOLR
CUTANEOUS | Status: AC
  Filled 2016-06-01: qty 20000

## 2016-06-01 MED ORDER — EPHEDRINE 5 MG/ML INJ
INTRAVENOUS | Status: AC
  Filled 2016-06-01: qty 10

## 2016-06-01 MED ORDER — VANCOMYCIN HCL 1000 MG IV SOLR
INTRAVENOUS | Status: AC
  Filled 2016-06-01: qty 1000

## 2016-06-01 MED ORDER — BUPIVACAINE HCL (PF) 0.25 % IJ SOLN
INTRAMUSCULAR | Status: DC | PRN
  Administered 2016-06-01: 20 mL

## 2016-06-01 MED ORDER — EPHEDRINE SULFATE 50 MG/ML IJ SOLN
INTRAMUSCULAR | Status: DC | PRN
  Administered 2016-06-01: 10 mg via INTRAVENOUS

## 2016-06-01 MED ORDER — MENTHOL 3 MG MT LOZG: 1.0000 | LOZENGE | OROMUCOSAL | Status: DC | PRN

## 2016-06-01 MED ORDER — SODIUM CHLORIDE 0.9 % IV SOLN
INTRAVENOUS | Status: DC | PRN
  Administered 2016-06-01: 12:00:00 via INTRAVENOUS

## 2016-06-01 MED ORDER — VITAMIN D 1000 UNITS PO TABS
5000.0000 [IU] | ORAL_TABLET | Freq: Every day | ORAL | Status: DC
  Administered 2016-06-02 – 2016-06-03 (×2): 5000 [IU] via ORAL
  Filled 2016-06-01 (×2): qty 5

## 2016-06-01 MED ORDER — SUGAMMADEX SODIUM 200 MG/2ML IV SOLN
INTRAVENOUS | Status: DC | PRN
  Administered 2016-06-01: 130 mg via INTRAVENOUS

## 2016-06-01 MED ORDER — LOSARTAN POTASSIUM 50 MG PO TABS
100.0000 mg | ORAL_TABLET | Freq: Every day | ORAL | Status: DC
  Administered 2016-06-01: 100 mg via ORAL
  Filled 2016-06-01: qty 2

## 2016-06-01 MED ORDER — CEFAZOLIN SODIUM-DEXTROSE 2-4 GM/100ML-% IV SOLN
2.0000 g | INTRAVENOUS | Status: AC
  Administered 2016-06-01: 2 g via INTRAVENOUS
  Filled 2016-06-01: qty 100

## 2016-06-01 MED ORDER — 0.9 % SODIUM CHLORIDE (POUR BTL) OPTIME
TOPICAL | Status: DC | PRN
  Administered 2016-06-01: 1000 mL

## 2016-06-01 MED ORDER — MEPERIDINE HCL 25 MG/ML IJ SOLN: 6.2500 mg | INTRAMUSCULAR | Status: DC | PRN

## 2016-06-01 MED ORDER — VITAMIN D3 125 MCG (5000 UT) PO CAPS: 5000.0000 [IU] | ORAL_CAPSULE | Freq: Every day | ORAL | Status: DC

## 2016-06-01 MED ORDER — FENTANYL CITRATE (PF) 250 MCG/5ML IJ SOLN
INTRAMUSCULAR | Status: AC
  Filled 2016-06-01: qty 5

## 2016-06-01 MED ORDER — ADULT MULTIVITAMIN W/MINERALS CH
1.0000 | ORAL_TABLET | Freq: Every day | ORAL | Status: DC
  Administered 2016-06-02 – 2016-06-03 (×2): 1 via ORAL
  Filled 2016-06-01 (×2): qty 1

## 2016-06-01 MED ORDER — FENTANYL CITRATE (PF) 100 MCG/2ML IJ SOLN
INTRAMUSCULAR | Status: AC
  Administered 2016-06-01: 25 ug via INTRAVENOUS
  Filled 2016-06-01: qty 2

## 2016-06-01 MED ORDER — ALBUMIN HUMAN 5 % IV SOLN
INTRAVENOUS | Status: DC | PRN
  Administered 2016-06-01: 10:00:00 via INTRAVENOUS

## 2016-06-01 MED ORDER — BACITRACIN 50000 UNITS IM SOLR
INTRAMUSCULAR | Status: DC | PRN
  Administered 2016-06-01: 500 mL

## 2016-06-01 MED ORDER — THROMBIN 20000 UNITS EX SOLR
CUTANEOUS | Status: DC | PRN
  Administered 2016-06-01 (×2): 20 mL via TOPICAL

## 2016-06-01 MED ORDER — PHENYLEPHRINE 40 MCG/ML (10ML) SYRINGE FOR IV PUSH (FOR BLOOD PRESSURE SUPPORT)
PREFILLED_SYRINGE | INTRAVENOUS | Status: AC
  Filled 2016-06-01: qty 10

## 2016-06-01 MED ORDER — HYDROMORPHONE HCL 1 MG/ML IJ SOLN
0.2500 mg | INTRAMUSCULAR | Status: DC | PRN
  Administered 2016-06-01 (×2): 0.25 mg via INTRAVENOUS
  Administered 2016-06-01: 0.5 mg via INTRAVENOUS

## 2016-06-01 MED ORDER — ONDANSETRON HCL 4 MG PO TABS: 4.0000 mg | ORAL_TABLET | Freq: Four times a day (QID) | ORAL | Status: DC | PRN

## 2016-06-01 MED ORDER — PROPOFOL 10 MG/ML IV BOLUS
INTRAVENOUS | Status: DC | PRN
  Administered 2016-06-01: 100 mg via INTRAVENOUS

## 2016-06-01 MED ORDER — SODIUM CHLORIDE 0.9% FLUSH
3.0000 mL | Freq: Two times a day (BID) | INTRAVENOUS | Status: DC
  Administered 2016-06-01: 3 mL via INTRAVENOUS

## 2016-06-01 MED ORDER — STERILE WATER FOR IRRIGATION IR SOLN: Status: DC | PRN

## 2016-06-01 MED ORDER — FENTANYL CITRATE (PF) 250 MCG/5ML IJ SOLN
INTRAMUSCULAR | Status: DC | PRN
Start: 2016-06-01 — End: 2016-06-01
  Administered 2016-06-01 (×2): 50 ug via INTRAVENOUS
  Administered 2016-06-01: 100 ug via INTRAVENOUS
  Administered 2016-06-01 (×6): 50 ug via INTRAVENOUS

## 2016-06-01 MED ORDER — FENTANYL CITRATE (PF) 100 MCG/2ML IJ SOLN
INTRAMUSCULAR | Status: AC
  Administered 2016-06-01: 50 ug via INTRAVENOUS
  Filled 2016-06-01: qty 2

## 2016-06-01 MED ORDER — DIAZEPAM 5 MG PO TABS
5.0000 mg | ORAL_TABLET | Freq: Four times a day (QID) | ORAL | Status: DC | PRN
  Administered 2016-06-01 – 2016-06-03 (×7): 5 mg via ORAL
  Filled 2016-06-01 (×7): qty 1

## 2016-06-01 MED ORDER — CHLORHEXIDINE GLUCONATE CLOTH 2 % EX PADS: 6.0000 | MEDICATED_PAD | Freq: Once | CUTANEOUS | Status: DC

## 2016-06-01 MED ORDER — LIDOCAINE 2% (20 MG/ML) 5 ML SYRINGE
INTRAMUSCULAR | Status: AC
  Filled 2016-06-01: qty 5

## 2016-06-01 MED ORDER — PROPOFOL 10 MG/ML IV BOLUS
INTRAVENOUS | Status: AC
  Filled 2016-06-01: qty 20

## 2016-06-01 MED ORDER — BUPIVACAINE HCL (PF) 0.25 % IJ SOLN
INTRAMUSCULAR | Status: AC
Start: 2016-06-01 — End: 2016-06-01
  Filled 2016-06-01: qty 30

## 2016-06-01 MED ORDER — HYDROMORPHONE HCL 1 MG/ML IJ SOLN
INTRAMUSCULAR | Status: AC
Start: 2016-06-01 — End: 2016-06-01
  Administered 2016-06-01: 0.25 mg via INTRAVENOUS
  Filled 2016-06-01: qty 1

## 2016-06-01 MED ORDER — HYDROMORPHONE HCL 1 MG/ML IJ SOLN: 0.5000 mg | INTRAMUSCULAR | Status: DC | PRN

## 2016-06-01 MED ORDER — FENTANYL CITRATE (PF) 100 MCG/2ML IJ SOLN
25.0000 ug | INTRAMUSCULAR | Status: DC | PRN
  Administered 2016-06-01 (×4): 25 ug via INTRAVENOUS
  Administered 2016-06-01: 50 ug via INTRAVENOUS

## 2016-06-01 MED ORDER — PHENYLEPHRINE HCL 10 MG/ML IJ SOLN
INTRAVENOUS | Status: DC | PRN
  Administered 2016-06-01: 30 ug/min via INTRAVENOUS

## 2016-06-01 MED ORDER — LOSARTAN POTASSIUM-HCTZ 100-12.5 MG PO TABS: 1.0000 | ORAL_TABLET | Freq: Every day | ORAL | Status: DC

## 2016-06-01 MED ORDER — HYDROCHLOROTHIAZIDE 12.5 MG PO CAPS
12.5000 mg | ORAL_CAPSULE | Freq: Every day | ORAL | Status: DC
  Administered 2016-06-01: 12.5 mg via ORAL
  Filled 2016-06-01: qty 1

## 2016-06-01 MED ORDER — ONDANSETRON HCL 4 MG/2ML IJ SOLN: 4.0000 mg | Freq: Four times a day (QID) | INTRAMUSCULAR | Status: DC | PRN

## 2016-06-01 MED ORDER — VANCOMYCIN HCL 1000 MG IV SOLR
INTRAVENOUS | Status: DC | PRN
  Administered 2016-06-01: 1000 mg via TOPICAL

## 2016-06-01 SURGICAL SUPPLY — 72 items
ADH SKN CLS APL DERMABOND .7 (GAUZE/BANDAGES/DRESSINGS) ×1
ADH SKN CLS LQ APL DERMABOND (GAUZE/BANDAGES/DRESSINGS) ×1
APL SKNCLS STERI-STRIP NONHPOA (GAUZE/BANDAGES/DRESSINGS) ×1
BAG DECANTER FOR FLEXI CONT (MISCELLANEOUS) ×3 IMPLANT
BENZOIN TINCTURE PRP APPL 2/3 (GAUZE/BANDAGES/DRESSINGS) ×3 IMPLANT
BLADE CLIPPER SURG (BLADE) ×2 IMPLANT
BUR CUTTER 7.0 ROUND (BURR) IMPLANT
BUR MATCHSTICK NEURO 3.0 LAGG (BURR) ×3 IMPLANT
CANISTER SUCT 3000ML PPV (MISCELLANEOUS) ×3 IMPLANT
CAP LCK SPNE (Orthopedic Implant) ×8 IMPLANT
CAP LOCK SPINE RADIUS (Orthopedic Implant) IMPLANT
CAP LOCKING (Orthopedic Implant) ×24 IMPLANT
CARTRIDGE OIL MAESTRO DRILL (MISCELLANEOUS) ×1 IMPLANT
CLOSURE WOUND 1/2 X4 (GAUZE/BANDAGES/DRESSINGS) ×1
CONT SPEC 4OZ CLIKSEAL STRL BL (MISCELLANEOUS) ×3 IMPLANT
COVER BACK TABLE 60X90IN (DRAPES) ×3 IMPLANT
DECANTER SPIKE VIAL GLASS SM (MISCELLANEOUS) ×1 IMPLANT
DERMABOND ADHESIVE PROPEN (GAUZE/BANDAGES/DRESSINGS) ×2
DERMABOND ADVANCED (GAUZE/BANDAGES/DRESSINGS) ×2
DERMABOND ADVANCED .7 DNX12 (GAUZE/BANDAGES/DRESSINGS) ×1 IMPLANT
DERMABOND ADVANCED .7 DNX6 (GAUZE/BANDAGES/DRESSINGS) IMPLANT
DEVICE INTERBODY ELEVATE 23X7 (Cage) ×8 IMPLANT
DEVICE INTERBODY ELEVATE 23X8 (Cage) ×6 IMPLANT
DIFFUSER DRILL AIR PNEUMATIC (MISCELLANEOUS) ×3 IMPLANT
DRAPE C-ARM 42X72 X-RAY (DRAPES) ×6 IMPLANT
DRAPE HALF SHEET 40X57 (DRAPES) IMPLANT
DRAPE LAPAROTOMY 100X72X124 (DRAPES) ×3 IMPLANT
DRAPE POUCH INSTRU U-SHP 10X18 (DRAPES) ×3 IMPLANT
DRAPE SURG 17X23 STRL (DRAPES) ×12 IMPLANT
DURAPREP 26ML APPLICATOR (WOUND CARE) ×3 IMPLANT
ELECT REM PT RETURN 9FT ADLT (ELECTROSURGICAL) ×3
ELECTRODE REM PT RTRN 9FT ADLT (ELECTROSURGICAL) ×1 IMPLANT
EVACUATOR 1/8 PVC DRAIN (DRAIN) ×3 IMPLANT
GAUZE SPONGE 4X4 12PLY STRL (GAUZE/BANDAGES/DRESSINGS) ×3 IMPLANT
GAUZE SPONGE 4X4 16PLY XRAY LF (GAUZE/BANDAGES/DRESSINGS) ×2 IMPLANT
GLOVE BIO SURGEON STRL SZ8 (GLOVE) ×2 IMPLANT
GLOVE BIO SURGEON STRL SZ8.5 (GLOVE) ×2 IMPLANT
GLOVE BIOGEL PI IND STRL 7.0 (GLOVE) IMPLANT
GLOVE BIOGEL PI INDICATOR 7.0 (GLOVE) ×6
GLOVE ECLIPSE 7.0 STRL STRAW (GLOVE) ×4 IMPLANT
GLOVE ECLIPSE 9.0 STRL (GLOVE) ×6 IMPLANT
GLOVE EXAM NITRILE LRG STRL (GLOVE) IMPLANT
GLOVE EXAM NITRILE XL STR (GLOVE) IMPLANT
GLOVE EXAM NITRILE XS STR PU (GLOVE) ×2 IMPLANT
GLOVE SURG SS PI 6.5 STRL IVOR (GLOVE) ×6 IMPLANT
GOWN STRL REUS W/ TWL LRG LVL3 (GOWN DISPOSABLE) IMPLANT
GOWN STRL REUS W/ TWL XL LVL3 (GOWN DISPOSABLE) ×2 IMPLANT
GOWN STRL REUS W/TWL 2XL LVL3 (GOWN DISPOSABLE) IMPLANT
GOWN STRL REUS W/TWL LRG LVL3 (GOWN DISPOSABLE)
GOWN STRL REUS W/TWL XL LVL3 (GOWN DISPOSABLE) ×15
KIT BASIN OR (CUSTOM PROCEDURE TRAY) ×3 IMPLANT
KIT ROOM TURNOVER OR (KITS) ×3 IMPLANT
NEEDLE HYPO 22GX1.5 SAFETY (NEEDLE) ×3 IMPLANT
NS IRRIG 1000ML POUR BTL (IV SOLUTION) ×3 IMPLANT
OIL CARTRIDGE MAESTRO DRILL (MISCELLANEOUS) ×3
PACK LAMINECTOMY NEURO (CUSTOM PROCEDURE TRAY) ×3 IMPLANT
ROD MAX 90MM (Rod) ×4 IMPLANT
SCREW 5.75X40M (Screw) ×16 IMPLANT
SPACER SPNL STD 23X8XSTRL (Cage) IMPLANT
SPCR SPNL STD 23X8XSTRL (Cage) ×2 IMPLANT
SPONGE LAP 4X18 X RAY DECT (DISPOSABLE) ×2 IMPLANT
SPONGE SURGIFOAM ABS GEL 100 (HEMOSTASIS) ×3 IMPLANT
STRIP CLOSURE SKIN 1/2X4 (GAUZE/BANDAGES/DRESSINGS) ×3 IMPLANT
SUT VIC AB 0 CT1 18XCR BRD8 (SUTURE) ×2 IMPLANT
SUT VIC AB 0 CT1 8-18 (SUTURE) ×6
SUT VIC AB 2-0 CT1 18 (SUTURE) ×6 IMPLANT
SUT VIC AB 3-0 SH 8-18 (SUTURE) ×6 IMPLANT
SYR BULB IRRIGATION 50ML (SYRINGE) ×2 IMPLANT
TOWEL GREEN STERILE (TOWEL DISPOSABLE) ×2 IMPLANT
TOWEL GREEN STERILE FF (TOWEL DISPOSABLE) ×3 IMPLANT
TRAY FOLEY W/METER SILVER 16FR (SET/KITS/TRAYS/PACK) ×3 IMPLANT
WATER STERILE IRR 1000ML POUR (IV SOLUTION) ×3 IMPLANT

## 2016-06-01 NOTE — Op Note (Signed)
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  L2-3, L3-4, L4-5 posterior lumbar interbody fusion utilizing interbody expandable cages and local autograft.  L2-3 45 posterior lateral arthrodesis utilizing segmental pedicle screw fixation and local autograft.  Surgeon:Lanetta Figuero A.Raphaella Larkin, M.D.  Asst. Surgeon: Lovell SheehanJenkins  Anesthesia: General  Indication: 76 year old female with severe back and bilateral lower extremity pain paresthesias and weakness consistent with severe neurogenic claudication. Workup demonstrates evidence of multilevel disc degeneration with near complete disc space collapse and associated degenerative scoliosis and degenerative lateral listhesis of L2-3 and L3-4. She has anterolisthesis of L4-5 with critical stenosis. Patient presents now for three-level lumbar decompression and fusion.  Operative note: After induction of anesthesia, patient position prone onto Wilson frame and a properly padded. Lumbar region prepped and draped sterilely. Incision overlying L2-L5 was made. Dissection performed bilaterally. Retractor placed. Fluoroscopy used. Levels confirmed. Decompressive laminotomies and foraminotomies were used using the high-speed drill, Kerrison rongeurs and Leksell rongeurs to remove the inferior two thirds of the lamina above the entire pars and inferior facet and the majority the superior facet of the level below as was the superior rim of the lamina below. Ligament flavum elevated and resected all 3 levels. Foraminotomies completed on the course the exiting nerve roots. Bilateral discectomies performed at L2-3,  L3-4 and L4-5. Disc spaces prepared for interbody fusion. With distractor placement patient's contralateral side disc spaces further scraped and cleaned. A [employer] expandable cage which was 8 mm standard lordosis was packed with locally harvested autograft and impacted in place at L4-5. This is expanded to its full extent. Distractor was removed patient's right side. Disc space prepared on the right side. Morselized autograft packed in the interspace. A second cage packed with autograft was then impacted in place and expanded to its full extent. Procedure then repeated at L2-3 and L3-4 again without complication. 7 mm extra lordotic cages were used at these levels. Pedicles of L2, L3-L4 and L5 were then verified using surface landmarks and intraoperative fluoroscopy. The pedicle on the left L2-3 was known to be hypoplastic and plans for an extra pedicular approach was made. Pilot holes were performed using high-speed drill. Each pilot was probed using pedicle awl each pedicle awl was then probed and found to be solidly within the bone. Each pedicle awl track was then tapped. Each screw tap hole was probed and found to be solidly within the bone. 5.75 mm x 40 mm radius brand screws from [employer] were placed bilaterally at L2 L3 L4 and L5. Images revealed good position of the cages and the screws at the proper upper level with normal lamina spine. Wounds and irrigated one final time with and bike solution. Gelfoam was placed topically for hemostasis. Lateral transverse processes were decorticated. Morselize autograft was packed posterior laterally. Short segment titanium rod placed over the screw heads from L2-L5. Locking caps and placed over the screws. Locking caps were then engaged with the construct under compression. Negative Meissen powder was left in the deep wound space. Wounds and close in layers of Vicryl sutures. Steri-Strips and sterile dressing were applied. No apparent complications. Patient  tolerated the procedure well and she returns to the recovery room postop.

## 2016-06-01 NOTE — Brief Op Note (Signed)
06/01/2016  12:04 PM  PATIENT:  Nolon NationsAnna K Heidler  76 y.o. female  PRE-OPERATIVE DIAGNOSIS:  Spondylolisthesis  POST-OPERATIVE DIAGNOSIS:  Spondylolisthesis  PROCEDURE:  Procedure(s): Posterior Lumbar Two-Three/Three-Four/Four-Five Interbody and Fusion (N/A)  SURGEON:  Surgeon(s) and Role:    * Julio SicksPool, Matisse Roskelley, MD - Primary    * Tressie StalkerJenkins, Jeffrey, MD - Assisting  PHYSICIAN ASSISTANT:   ASSISTANTS:    ANESTHESIA:   general  EBL:  Total I/O In: 2510 [I.V.:2100; Blood:160; IV Piggyback:250] Out: 1100 [Urine:500; Blood:600]  BLOOD ADMINISTERED:none  DRAINS: none   LOCAL MEDICATIONS USED:  MARCAINE     SPECIMEN:  No Specimen  DISPOSITION OF SPECIMEN:  N/A  COUNTS:  YES  TOURNIQUET:  * No tourniquets in log *  DICTATION: .Dragon Dictation  PLAN OF CARE: Admit to inpatient   PATIENT DISPOSITION:  PACU - hemodynamically stable.   Delay start of Pharmacological VTE agent (>24hrs) due to surgical blood loss or risk of bleeding: yes

## 2016-06-01 NOTE — Anesthesia Preprocedure Evaluation (Addendum)
Anesthesia Evaluation  Patient identified by MRN, date of birth, ID band Patient awake    Reviewed: Allergy & Precautions, NPO status , Patient's Chart, lab work & pertinent test results  Airway Mallampati: II  TM Distance: >3 FB Neck ROM: Full    Dental no notable dental hx.    Pulmonary neg pulmonary ROS,    Pulmonary exam normal breath sounds clear to auscultation       Cardiovascular hypertension, Pt. on medications Normal cardiovascular exam Rhythm:Regular Rate:Normal     Neuro/Psych negative neurological ROS  negative psych ROS   GI/Hepatic negative GI ROS, Neg liver ROS,   Endo/Other  negative endocrine ROS  Renal/GU negative Renal ROS  negative genitourinary   Musculoskeletal negative musculoskeletal ROS (+)   Abdominal   Peds negative pediatric ROS (+)  Hematology negative hematology ROS (+)   Anesthesia Other Findings   Reproductive/Obstetrics negative OB ROS                            Anesthesia Physical Anesthesia Plan  ASA: II  Anesthesia Plan: General   Post-op Pain Management:    Induction: Intravenous  Airway Management Planned: Oral ETT  Additional Equipment:   Intra-op Plan:   Post-operative Plan: Extubation in OR  Informed Consent: I have reviewed the patients History and Physical, chart, labs and discussed the procedure including the risks, benefits and alternatives for the proposed anesthesia with the patient or authorized representative who has indicated his/her understanding and acceptance.   Dental advisory given  Plan Discussed with: CRNA  Anesthesia Plan Comments:         Anesthesia Quick Evaluation  

## 2016-06-01 NOTE — Anesthesia Procedure Notes (Signed)
Procedure Name: Intubation Date/Time: 06/01/2016 8:08 AM Performed by: Rosiland OzMEYERS, [name] Pre-anesthesia Checklist: Patient identified, Emergency Drugs available, Suction available, Patient being monitored and Timeout performed Patient Re-evaluated:Patient Re-evaluated prior to inductionOxygen Delivery Method: Circle system utilized Preoxygenation: Pre-oxygenation with 100% oxygen Intubation Type: IV induction Ventilation: Mask ventilation without difficulty Laryngoscope Size: Miller and 3 Grade View: Grade I Tube type: Oral Tube size: 7.0 mm Number of attempts: 1 Airway Equipment and Method: Stylet Placement Confirmation: ETT inserted through vocal cords under direct vision,  positive ETCO2 and breath sounds checked- equal and bilateral Secured at: 21 cm Tube secured with: Tape Dental Injury: Teeth and Oropharynx as per pre-operative assessment

## 2016-06-01 NOTE — Evaluation (Signed)
Physical Therapy Evaluation Patient Details Name: [name] MRN: [id number] DOB: 03/31/1940 Today's Date: 06/01/2016   History of Present Illness  Pt is a 76 y/o female who presents s/p L2-L5 PLIF on 06/01/16.  Clinical Impression  Pt admitted with above diagnosis. Pt currently with functional limitations due to the deficits listed below (see PT Problem List). At the time of PT eval pt was able to perform transfers to/from EOB with min assist for trunk support and proper log roll technique. Pt reports extreme pain, lightheadedness, and diaphoresis upon sitting EOB. Pt was returned to supine and positioned with pillow under L side of back for pressure relief and pain control. Anticipate pt will progress well with therapy. If does not show improvement next session may benefit from HHPT. Pt will benefit from skilled PT to increase their independence and safety with mobility to allow discharge to the venue listed below.       Follow Up Recommendations No PT follow up;Supervision/Assistance - 24 hour    Equipment Recommendations  Rolling walker with 5" wheels;3in1 (PT)    Recommendations for Other Services       Precautions / Restrictions Precautions Precautions: Fall;Back Precaution Booklet Issued: Yes (comment) Precaution Comments: Reviewed handout in detail. Pt was cued for precautions during functional mobility.  Required Braces or Orthoses: Spinal Brace Spinal Brace: Lumbar corset;Applied in sitting position Restrictions Weight Bearing Restrictions: No      Mobility  Bed Mobility Overal bed mobility: Needs Assistance Bed Mobility: Rolling;Sidelying to Sit Rolling: Supervision Sidelying to sit: Min assist       General bed mobility comments: VC's for log roll technique. Pt was able to transition to EOB with support at the shoulders for trunk elevation. She demonstrated poor technique and quick transition back to sidelying due to pain.   Transfers                  General transfer comment: Pt became lightheaded and diaphoretic upon sitting EOB ~5 minutes. Upon return to supine reports feeling much better almost instantly.   Ambulation/Gait                Stairs            Wheelchair Mobility    Modified Rankin (Stroke Patients Only)       Balance Overall balance assessment:  (Unable to assess)                                           Pertinent Vitals/Pain Pain Assessment: 0-10 Pain Score: 10-Worst pain ever Pain Location: Incision site/back Pain Descriptors / Indicators: Operative site guarding;Discomfort Pain Intervention(s): Limited activity within patient's tolerance;Monitored during session;Repositioned    Home Living Family/patient expects to be discharged to:: Private residence Living Arrangements: Alone Available Help at Discharge: Family;Available 24 hours/day (Son in town for 1 week) Type of Home: House Home Access: Level entry     Home Layout: One level Home Equipment: Production assistant, radio - single point      Prior Function Level of Independence: Independent (Used AD for 1 week prior to surgery)               Hand Dominance        Extremity/Trunk Assessment   Upper Extremity Assessment Upper Extremity Assessment: Defer to OT evaluation    Lower Extremity Assessment Lower Extremity Assessment: Generalized weakness  Cervical / Trunk Assessment Cervical / Trunk Assessment: Kyphotic;Other exceptions Cervical / Trunk Exceptions: forward head/rounded shoulders posture  Communication   Communication: No difficulties  Cognition Arousal/Alertness: Awake/alert Behavior During Therapy: WFL for tasks assessed/performed Overall Cognitive Status: Within Functional Limits for tasks assessed                                        General Comments      Exercises     Assessment/Plan    PT Assessment Patient needs continued PT services  PT Problem List  Decreased strength;Decreased range of motion;Decreased activity tolerance;Decreased balance;Decreased mobility;Decreased knowledge of use of DME;Decreased safety awareness;Decreased knowledge of precautions;Pain       PT Treatment Interventions DME instruction;Gait training;Stair training;Functional mobility training;Therapeutic activities;Therapeutic exercise;Neuromuscular re-education;Patient/family education    PT Goals (Current goals can be found in the Care Plan section)  Acute Rehab PT Goals Patient Stated Goal: Feel better PT Goal Formulation: With patient Time For Goal Achievement: 06/08/16 Potential to Achieve Goals: Good    Frequency Min 5X/week   Barriers to discharge        Co-evaluation               AM-PAC PT "6 Clicks" Daily Activity  Outcome Measure Difficulty turning over in bed (including adjusting bedclothes, sheets and blankets)?: Total Difficulty moving from lying on back to sitting on the side of the bed? : Total Difficulty sitting down on and standing up from a chair with arms (e.g., wheelchair, bedside commode, etc,.)?: Total Help needed moving to and from a bed to chair (including a wheelchair)?: A Lot Help needed walking in hospital room?: A Lot Help needed climbing 3-5 steps with a railing? : Total 6 Click Score: 8    End of Session Equipment Utilized During Treatment: Oxygen Activity Tolerance: Treatment limited secondary to medical complications (Comment) (Lightheadedness and diaphoresis) Patient left: in bed;with call bell/phone within reach Nurse Communication: Mobility status PT Visit Diagnosis: Unsteadiness on feet (R26.81);Pain;Muscle weakness (generalized) (M62.81) Pain - Right/Left: Left Pain - part of body:  (back)    Time: [id number] PT Time Calculation (min) (ACUTE ONLY): 19 min   Charges:   PT Evaluation $PT Eval Moderate Complexity: 1 Procedure     PT G Codes:        Conni SlipperLaura Aunika Kirsten, PT, DPT Acute Rehabilitation  Services Pager: [phone]   Marylynn PearsonLaura D Talal Fritchman 06/01/2016, 3:02 PM

## 2016-06-01 NOTE — Transfer of Care (Signed)
Immediate Anesthesia Transfer of Care Note  Patient: [name]  Procedure(s) Performed: Procedure(s): Posterior Lumbar Two-Three/Three-Four/Four-Five Interbody and Fusion (N/A)  Patient Location: PACU  Anesthesia Type:General  Level of Consciousness: awake and patient cooperative  Airway & Oxygen Therapy: Patient Spontanous Breathing  Post-op Assessment: Report given to RN and Post -op Vital signs reviewed and stable  Post vital signs: Reviewed and stable  Last Vitals:  Vitals:   06/01/16 0634  BP: (!) 155/92  Pulse: (!) 104  Resp: 20  Temp: 36.8 C    Last Pain:  Vitals:   06/01/16 0634  TempSrc: Oral      Patients Stated Pain Goal: 6 (06/01/16 0640)  Complications: No apparent anesthesia complications

## 2016-06-01 NOTE — Progress Notes (Signed)
Orthopedic Tech Progress Note Patient Details:  [name] 02/28/1940 [id number] Patient has brace. Patient ID: [name], female   DOB: 09/20/1940, 76 y.o.   MRN: [id number]   [name], [name] 06/01/2016, 3:00 PM

## 2016-06-01 NOTE — H&P (Signed)
  [name] is an 76 y.o. female.   Chief Complaint: Pain HPI: 76 year old female with severe back pain and bilateral lower extremity symptoms consistent with neurogenic claudication. Workup demonstrates evidence of severe multilevel disc degeneration with significant lateral listhesis and stenosis at L2-3 and L3-4 and anterior listhesis of L4 and L5 with critical stenosis. Patient presents now for multilevel decompression infusion in hopes of improving her symptoms.  Past Medical History:  Diagnosis Date  . Arthritis   . Hypertension     Past Surgical History:  Procedure Laterality Date  . CHOLECYSTECTOMY  2006    History reviewed. No pertinent family history. Social History:  reports that she has never smoked. She has never used smokeless tobacco. She reports that she does not drink alcohol or use drugs.  Allergies:  Allergies  Allergen Reactions  . Lisinopril Other (See Comments)    Dizziness and felt odd    Medications Prior to Admission  Medication Sig Dispense Refill  . Cholecalciferol (VITAMIN D3) 5000 units CAPS Take 5,000 Units by mouth daily.    Marland Kitchen. losartan-hydrochlorothiazide (HYZAAR) 100-12.5 MG tablet Take 1 tablet by mouth daily.  11  . Multiple Vitamin (MULTIVITAMIN WITH MINERALS) TABS tablet Take 1 tablet by mouth daily.    . naproxen sodium (ANAPROX) 220 MG tablet Take 220 mg by mouth daily as needed (pain).    Marland Kitchen. sertraline (ZOLOFT) 50 MG tablet Take 50 mg by mouth every evening.  8  . traMADol (ULTRAM) 50 MG tablet Take 50 mg by mouth 3 (three) times daily as needed for pain.  1    No results found for this or any previous visit (from the past 48 hour(s)). No results found.  Pertinent items noted in HPI and remainder of comprehensive ROS otherwise negative.  Blood pressure (!) 155/92, pulse (!) 104, temperature 98.3 F (36.8 C), temperature source Oral, resp. rate 20, SpO2 98 %.  Patient is awake and alert. She is oriented and appropriate. Her speech is  fluent. Judgment and insight are intact. Cranial nerve function normal bilaterally. Motor and sensory function of the extremities reveals mild weakness of dorsiflexion bilaterally. She has decreased sensation to pinprick and light touch from L4 distally. Reflexes are hypoactive but symmetric. Gait is antalgic. Posture is flexed. Examination head ears eyes nose throat is unremarkable. Chest and abdomen are benign. Extremities are free from injury or deformity. 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 Plan bilateral L2-3, L3-4, L4-5 decompressive laminotomies and foraminotomies on the posterior lumbar fusion utilizing interbody cages, locally harvested autograft, and augmented with posterior lateral arthrodesis utilizing segmental pedicle screw fixation and local autografting. Risks and benefits been explained. Patient wishes to proceed.  Marinus Eicher A 06/01/2016, 7:38 AM

## 2016-06-02 NOTE — Progress Notes (Signed)
Postoperative day 1. Patient reports moderate lumbar pain. No radicular pain. Standing and walking without difficulty.  Afebrile. Blood pressure good. Heart rate elevated. Urine output good however. No orthostasis. Examination in general finds her to appear nontoxic. Color is normal. Abdomen is soft. Motor sensory examination intact.  Progressing well following three-level lumbar decompression and fusion. Slowly mobilize with therapy. Given good urine output do not think the patient has significant hypovolemic at present. Will watch for signs of symptomatology although I suspect she'll begin to mobilize thirds placed fluid and the symptoms will resolve on their own.

## 2016-06-02 NOTE — Progress Notes (Signed)
Occupational Therapy Evaluation Patient Details Name: [name] MRN: [id number] DOB: 12-Feb-1940 Today's Date: 06/02/2016    History of Present Illness Pt is a 76 y/o female who presents s/p L2-L5 PLIF on 06/01/16.   Clinical Impression   PTA, pt lived alone and was independent with ADL and mobility. Pt plans to DC home and her son will help as long as needed. Began education regarding compensatory techniques for ADL and functional mobility while adhering to back precautions. Pt complained of feeling "light headed" after walking to bathroom. BP 101/63. HR 131. Nsg made aware. Will follow acutely to maximize functional level of independence and facilitate safe DC home with assistance from son.     Follow Up Recommendations  No OT follow up;Supervision - Intermittent    Equipment Recommendations  None recommended by OT    Recommendations for Other Services       Precautions / Restrictions Precautions Precautions: Fall;Back Precaution Booklet Issued: Yes (comment) Precaution Comments: Reviewed handout .  Required Braces or Orthoses: Spinal Brace Spinal Brace: Lumbar corset;Applied in sitting position Restrictions Weight Bearing Restrictions: No      Mobility Bed Mobility  OOB in chair     Transfers Overall transfer level: Needs assistance Equipment used: Rolling walker (2 wheeled) Transfers: Sit to/from Stand Sit to Stand: Min guard         General transfer comment: cues for back precautionsn.     Balance Overall balance assessment: Needs assistance Sitting-balance support: Feet supported;No upper extremity supported Sitting balance-Leahy Scale: Fair     Standing balance support: No upper extremity supported;During functional activity Standing balance-Leahy Scale: Poor Standing balance comment: dynamic                           ADL either performed or assessed with clinical judgement   ADL Overall ADL's : Needs assistance/impaired     Grooming:  Supervision/safety;Set up;Standing   Upper Body Bathing: Set up;Supervision/ safety;Sitting   Lower Body Bathing: Minimal assistance;Sit to/from stand   Upper Body Dressing : Set up;Supervision/safety;Sitting   Lower Body Dressing: Minimal assistance;Sit to/from stand   Toilet Transfer: Min guard (HHA)           Functional mobility during ADLs: Min guard General ADL Comments: Began education regarding compensatory techniques and use of DME for ADL.      Vision         Perception     Praxis      Pertinent Vitals/Pain Pain Assessment: 0-10 Pain Score: 5  Faces Pain Scale: Hurts little more Pain Location: Incision site/back Pain Descriptors / Indicators: Operative site guarding;Discomfort Pain Intervention(s): Limited activity within patient's tolerance     Hand Dominance     Extremity/Trunk Assessment Upper Extremity Assessment Upper Extremity Assessment: Overall WFL for tasks assessed   Lower Extremity Assessment Lower Extremity Assessment: Defer to PT evaluation   Cervical / Trunk Assessment Cervical / Trunk Exceptions: back sx   Communication Communication Communication: No difficulties   Cognition Arousal/Alertness: Awake/alert Behavior During Therapy: WFL for tasks assessed/performed Overall Cognitive Status: Within Functional Limits for tasks assessed                                     General Comments       Exercises     Shoulder Instructions      Home Living Family/patient expects to be discharged  to:: Private residence Living Arrangements: Alone Available Help at Discharge: Family;Available 24 hours/day (Son in town for 1 week) Type of Home: House Home Access: Level entry     Home Layout: One level     Bathroom Shower/Tub: Tub/shower unit;Walk-in shower   Bathroom Toilet: Standard Bathroom Accessibility: Yes How Accessible: Accessible via walker Home Equipment: Shower seat;Cane - single point;Walker - 2 wheels  (has equipment she plans to borrow)   Additional Comments: son is staying with her as long as needed      Prior Functioning/Environment Level of Independence: Independent (Used AD for 1 week prior to surgery)                 OT Problem List: Decreased activity tolerance;Decreased knowledge of use of DME or AE;Decreased knowledge of precautions;Pain      OT Treatment/Interventions: Self-care/ADL training;DME and/or AE instruction;Therapeutic activities;Patient/family education    OT Goals(Current goals can be found in the care plan section) Acute Rehab OT Goals Patient Stated Goal: be independent OT Goal Formulation: With patient Time For Goal Achievement: 06/16/16 Potential to Achieve Goals: Good ADL Goals Pt Will Perform Lower Body Bathing: with supervision;sit to/from stand Pt Will Perform Lower Body Dressing: with supervision;sit to/from stand Pt Will Perform Tub/Shower Transfer: with supervision;ambulating;with caregiver independent in assisting Additional ADL Goal #1: Pt will independently demonstrate understadning of 3/3 back precautions for ADL  OT Frequency: Min 2X/week   Barriers to D/C:            Co-evaluation              AM-PAC PT "6 Clicks" Daily Activity     Outcome Measure Help from another person eating meals?: None Help from another person taking care of personal grooming?: A Little Help from another person toileting, which includes using toliet, bedpan, or urinal?: A Little Help from another person bathing (including washing, rinsing, drying)?: A Little Help from another person to put on and taking off regular upper body clothing?: None Help from another person to put on and taking off regular lower body clothing?: A Little 6 Click Score: 20   End of Session Equipment Utilized During Treatment: Back brace Nurse Communication: Other (comment) (to assist pt in bathroom)  Activity Tolerance: Patient tolerated treatment well Patient left: Other  (comment) (in bathroom. nsg aware)  OT Visit Diagnosis: Unsteadiness on feet (R26.81);Pain Pain - part of body:  (back)                Time: 0981-19140905-0928 OT Time Calculation (min): 23 min Charges:  OT General Charges $OT Visit: 1 Procedure OT Evaluation $OT Eval Moderate Complexity: 1 Procedure OT Treatments $Self Care/Home Management : 8-22 mins G-Codes:     [facility] Jacky Hartung, OT/L  [phone] 06/02/2016  Naveh Rickles,HILLARY 06/02/2016, 1:33 PM

## 2016-06-02 NOTE — Anesthesia Postprocedure Evaluation (Signed)
Anesthesia Post Note  Patient: [name]  Procedure(s) Performed: Procedure(s) (LRB): Posterior Lumbar Two-Three/Three-Four/Four-Five Interbody and Fusion (N/A)  Patient location during evaluation: PACU Anesthesia Type: General Level of consciousness: awake and alert Pain management: pain level controlled Vital Signs Assessment: post-procedure vital signs reviewed and stable Respiratory status: spontaneous breathing, nonlabored ventilation, respiratory function stable and patient connected to nasal cannula oxygen Cardiovascular status: blood pressure returned to baseline and stable Postop Assessment: no signs of nausea or vomiting Anesthetic complications: no       Last Vitals:  Vitals:   06/01/16 2345 06/02/16 0408  BP: 128/65 109/63  Pulse: (!) 113 (!) 125  Resp: 18 18  Temp: 37.1 C 37.1 C    Last Pain:  Vitals:   06/02/16 0419  TempSrc:   PainSc: 6                  Phillips Groutarignan, Liat Mayol

## 2016-06-02 NOTE — Progress Notes (Signed)
Physical Therapy Treatment Patient Details Name: [name] MRN: [id number] DOB: [birth year] Today's Date: 06/02/2016    History of Present Illness Pt is a 76 y/o female who presents s/p L2-L5 PLIF on 06/01/16.    PT Comments    Pt progressing towards physical therapy goals. Was able to perform transfers and ambulation with min guard to min assist for balance support and safety. Pt appeared more comfortable with RW use. Throughout session, BP soft (80's/50's) and HR increased (max 147 bpm after gait training). See Vitals Flowsheet for details. Pt reports minimal lightheadedness throughout session but did not appear to be limited with mobility by her symptoms. Will continue to follow and progress as able per POC.   Follow Up Recommendations  No PT follow up;Supervision/Assistance - 24 hour     Equipment Recommendations  Rolling walker with 5" wheels;3in1 (PT)    Recommendations for Other Services       Precautions / Restrictions Precautions Precautions: Fall;Back Precaution Booklet Issued: Yes (comment) Precaution Comments: Reviewed handout in detail. Pt was cued for precautions during functional mobility.  Required Braces or Orthoses: Spinal Brace Spinal Brace: Lumbar corset;Applied in sitting position Restrictions Weight Bearing Restrictions: No    Mobility  Bed Mobility Overal bed mobility: Needs Assistance Bed Mobility: Rolling;Sidelying to Sit Rolling: Supervision Sidelying to sit: Supervision       General bed mobility comments: VC's for log roll technique. Pt was able to transition to EOB with no assist from therapist.   Transfers Overall transfer level: Needs assistance Equipment used: Rolling walker (2 wheeled) Transfers: Sit to/from Stand Sit to Stand: Min guard         General transfer comment: Close guard for safety as pt powered-up to full standing position.   Ambulation/Gait Ambulation/Gait assistance: Min assist Ambulation Distance (Feet): 300  Feet Assistive device: Rolling walker (2 wheeled) Gait Pattern/deviations: Step-through pattern;Decreased stride length;Trunk flexed Gait velocity: Decreased Gait velocity interpretation: Below normal speed for age/gender General Gait Details: VC's for improved posture. Pt was able to ambulate with assist from therapist, however appeared more stable with RW. Noted pt with increased HR during session, with an increase to 147 bpm during gait training.    Stairs            Wheelchair Mobility    Modified Rankin (Stroke Patients Only)       Balance Overall balance assessment: Needs assistance Sitting-balance support: Feet supported;No upper extremity supported Sitting balance-Leahy Scale: Fair     Standing balance support: No upper extremity supported;During functional activity Standing balance-Leahy Scale: Poor Standing balance comment: dynamic                            Cognition Arousal/Alertness: Awake/alert Behavior During Therapy: WFL for tasks assessed/performed Overall Cognitive Status: Within Functional Limits for tasks assessed                                        Exercises      General Comments        Pertinent Vitals/Pain Pain Assessment: Faces Faces Pain Scale: Hurts little more Pain Location: Incision site/back Pain Descriptors / Indicators: Operative site guarding;Discomfort Pain Intervention(s): Limited activity within patient's tolerance;Monitored during session;Repositioned    Home Living  Prior Function            PT Goals (current goals can now be found in the care plan section) Acute Rehab PT Goals Patient Stated Goal: Feel better PT Goal Formulation: With patient Time For Goal Achievement: 06/08/16 Potential to Achieve Goals: Good Progress towards PT goals: Progressing toward goals    Frequency    Min 5X/week      PT Plan Current plan remains appropriate     Co-evaluation              AM-PAC PT "6 Clicks" Daily Activity  Outcome Measure  Difficulty turning over in bed (including adjusting bedclothes, sheets and blankets)?: Total Difficulty moving from lying on back to sitting on the side of the bed? : Total Difficulty sitting down on and standing up from a chair with arms (e.g., wheelchair, bedside commode, etc,.)?: Total Help needed moving to and from a bed to chair (including a wheelchair)?: A Lot Help needed walking in hospital room?: A Lot Help needed climbing 3-5 steps with a railing? : Total 6 Click Score: 8    End of Session Equipment Utilized During Treatment: Oxygen Activity Tolerance: Patient tolerated treatment well Patient left: in bed;with call bell/phone within reach Nurse Communication: Mobility status PT Visit Diagnosis: Unsteadiness on feet (R26.81);Pain;Muscle weakness (generalized) (M62.81) Pain - Right/Left: Left Pain - part of body:  (back)     Time: [id number] PT Time Calculation (min) (ACUTE ONLY): 32 min  Charges:  $Gait Training: 23-37 mins                    G Codes:       Conni SlipperLaura Koltan Portocarrero, PT, DPT Acute Rehabilitation Services Pager: [phone]    Marylynn PearsonLaura D [place] Dalporto 06/02/2016, 1:04 PM

## 2016-06-03 MED ORDER — HYDROCODONE-ACETAMINOPHEN 10-325 MG PO TABS: 1.0000 | ORAL_TABLET | ORAL | 0 refills | Status: DC | PRN

## 2016-06-03 MED ORDER — DIAZEPAM 5 MG PO TABS: 5.0000 mg | ORAL_TABLET | Freq: Four times a day (QID) | ORAL | 0 refills | Status: DC | PRN

## 2016-06-03 MED FILL — Sodium Chloride IV Soln 0.9%: INTRAVENOUS | Qty: 2000 | Status: AC

## 2016-06-03 MED FILL — Heparin Sodium (Porcine) Inj 1000 Unit/ML: INTRAMUSCULAR | Qty: 30 | Status: AC

## 2016-06-03 NOTE — Discharge Summary (Signed)
Physician Discharge Summary  Patient ID: [name] MRN: [id number] DOB/AGE: 74/04/1940 74 y.o.  Admit date: 06/01/2016 Discharge date: 06/03/2016  Admission Diagnoses:  Discharge Diagnoses:  Active Problems:   Degenerative spondylolisthesis   Discharged Condition: good  Hospital Course: Patient admitted to hospital where she underwent uncomplicated three-level lumbar decompression and fusion. Postoperatively she is doing well. Preoperative back and lower extremity pain much improved. Standing and walking better. Ready for discharge home.  Consults:   Significant Diagnostic Studies:   Treatments:   Discharge Exam: Blood pressure 114/62, pulse (!) 113, temperature 98.9 F (37.2 C), temperature source Oral, resp. rate 18, SpO2 98 %. Awake and alert. Oriented and appropriate. Cranial nerve function intact. Motor and sensory function extremities normal. Wounds clean and dry. Chest and abdomen benign.  Disposition:    Allergies as of 06/03/2016      Reactions   Lisinopril Other (See Comments)   Dizziness and felt odd      Medication List    TAKE these medications   diazepam 5 MG tablet Commonly known as:  VALIUM Take 1-2 tablets (5-10 mg total) by mouth every 6 (six) hours as needed for muscle spasms.   HYDROcodone-acetaminophen 10-325 MG tablet Commonly known as:  NORCO Take 1-2 tablets by mouth every 4 (four) hours as needed (breakthrough pain).   losartan-hydrochlorothiazide 100-12.5 MG tablet Commonly known as:  HYZAAR Take 1 tablet by mouth daily.   multivitamin with minerals Tabs tablet Take 1 tablet by mouth daily.   naproxen sodium 220 MG tablet Commonly known as:  ANAPROX Take 220 mg by mouth daily as needed (pain).   sertraline 50 MG tablet Commonly known as:  ZOLOFT Take 50 mg by mouth every evening.   traMADol 50 MG tablet Commonly known as:  ULTRAM Take 50 mg by mouth 3 (three) times daily as needed for pain.   Vitamin D3 5000 units  Caps Take 5,000 Units by mouth daily.            Durable Medical Equipment        Start     Ordered   06/01/16 1329  DME Walker rolling  Once    Question:  Patient needs a walker to treat with the following condition  Answer:  Degenerative spondylolisthesis   06/01/16 1328   06/01/16 1329  DME 3 n 1  Once     06/01/16 1328       Signed: Curtis Cain A 06/03/2016, 7:56 AM

## 2016-06-03 NOTE — Progress Notes (Signed)
Pt received RW from [facility] per MD order. Hulan Szumski, RN  

## 2016-06-03 NOTE — Progress Notes (Addendum)
Occupational Therapy Treatment Patient Details Name: [name] MRN: [id number] DOB: 1940/07/04 Today's Date: 06/03/2016    History of present illness Pt is a 76 y/o female who presents s/p L2-L5 PLIF on 06/01/16.   OT comments  Completed education regarding compensatory strategies for ADL while adhering to back precautions, home safety and reducing risk of falls. Pt safe to DC home with intermittent S when medically stable.   Follow Up Recommendations  No OT follow up;Supervision - Intermittent    Equipment Recommendations  None recommended by OT    Recommendations for Other Services      Precautions / Restrictions Precautions Precautions: Fall;Back Required Braces or Orthoses: Spinal Brace Spinal Brace: Lumbar corset;Applied in sitting position       Mobility Bed Mobility               General bed mobility comments: OOB in chair. Reports getting up independently  Transfers                 General transfer comment: Pt reports walking to the bathroom independently    Balance                                           ADL either performed or assessed with clinical judgement   ADL                                       Functional mobility during ADLs: Supervision/safety General ADL Comments: Reviewed availability of AE to assist with ADL if needed. Reviewed back precautions for ADL and recommendations of use of showerchair. Pt able to return demonstrate.      Vision       Perception     Praxis      Cognition Arousal/Alertness: Awake/alert Behavior During Therapy: WFL for tasks assessed/performed Overall Cognitive Status: Within Functional Limits for tasks assessed                                          Exercises     Shoulder Instructions       General Comments      Pertinent Vitals/ Pain       Pain Assessment: 0-10 Pain Score: 4  Pain Location: Incision site/back Pain Descriptors  / Indicators: Operative site guarding;Discomfort Pain Intervention(s): Limited activity within patient's tolerance  Home Living                                          Prior Functioning/Environment              Frequency           Progress Toward Goals  OT Goals(current goals can now be found in the care plan section)  Progress towards OT goals: Goals met/education completed, patient discharged from OT  Acute Rehab OT Goals Patient Stated Goal: be independent OT Goal Formulation: With patient Time For Goal Achievement: 06/16/16 Potential to Achieve Goals: Good ADL Goals Pt Will Perform Lower Body Bathing: with supervision;sit to/from stand Pt Will Perform Lower Body Dressing: with supervision;sit to/from stand Pt Will  Perform Tub/Shower Transfer: with supervision;ambulating;with caregiver independent in assisting Additional ADL Goal #1: Pt will independently demonstrate understadning of 3/3 back precautions for ADL  Plan Discharge plan remains appropriate;All goals met and education completed, patient discharged from OT services    Co-evaluation                 AM-PAC PT "6 Clicks" Daily Activity     Outcome Measure   Help from another person eating meals?: None Help from another person taking care of personal grooming?: None Help from another person toileting, which includes using toliet, bedpan, or urinal?: None Help from another person bathing (including washing, rinsing, drying)?: A Little Help from another person to put on and taking off regular upper body clothing?: None Help from another person to put on and taking off regular lower body clothing?: A Little 6 Click Score: 22    End of Session Equipment Utilized During Treatment: Back brace  OT Visit Diagnosis: Unsteadiness on feet (R26.81);Pain Pain - part of body:  (back)   Activity Tolerance Patient tolerated treatment well   Patient Left in chair   Nurse Communication  Other (comment) (appropriate for DC home)        Time: 3014-9969 OT Time Calculation (min): 15 min  Charges: OT General Charges $OT Visit: 1 Procedure OT Treatments $Self Care/Home Management : 8-22 mins  [facility], OT/L  [phone] 06/03/2016   Takeru Bose,HILLARY 06/03/2016, 9:04 AM

## 2016-06-03 NOTE — Progress Notes (Signed)
Pt doing well. Pt and sons given D/C instructions with Rx's, verbal understanding was provided. Pt's incision is clean and dry with no sign of infection. Pt's IV was removed prior to D/C. Pt D/C'd home via wheelchair @ 1010 per MD order. Pt is stable @ D/C and has no other needs at this time. Rema FendtAshley Lorenz Donley, RN

## 2016-06-03 NOTE — Progress Notes (Signed)
Physical Therapy Treatment Patient Details Name: [name] MRN: [id number] DOB: 10/10/1940 Today's Date: 06/03/2016    History of Present Illness Pt is a 76 y/o female who presents s/p L2-L5 PLIF on 06/01/16.    PT Comments    Pt progressing towards physical therapy goals. Was able to perform transfers and ambulation with gross min guard assist for safety. Pt used RW in hall and held to furniture in room for support without RW when walking around the bed to chair. Pt anticipates d/c home today. Will continue to follow and progress as able per POC.   Follow Up Recommendations  No PT follow up;Supervision/Assistance - 24 hour     Equipment Recommendations  Rolling walker with 5" wheels;3in1 (PT)    Recommendations for Other Services       Precautions / Restrictions Precautions Precautions: Fall;Back Precaution Booklet Issued: Yes (comment) Precaution Comments: Reviewed handout in detail. Pt was cued for precautions during functional mobility.  Required Braces or Orthoses: Spinal Brace Spinal Brace: Lumbar corset;Applied in sitting position Restrictions Weight Bearing Restrictions: No    Mobility  Bed Mobility Overal bed mobility: Needs Assistance Bed Mobility: Rolling;Sidelying to Sit Rolling: Supervision Sidelying to sit: Supervision       General bed mobility comments: OOB in chair upon PT arrival.  Transfers Overall transfer level: Needs assistance Equipment used: Rolling walker (2 wheeled) Transfers: Sit to/from Stand Sit to Stand: Min guard         General transfer comment: Close guard for safety. No assist required.   Ambulation/Gait Ambulation/Gait assistance: Min guard Ambulation Distance (Feet): 300 Feet Assistive device: Rolling walker (2 wheeled) Gait Pattern/deviations: Step-through pattern;Decreased stride length;Trunk flexed Gait velocity: Decreased Gait velocity interpretation: Below normal speed for age/gender General Gait Details: VC's for  improved posture. Pt was able to ambulate with assist from therapist, however appeared more stable with RW. Noted pt with increased HR during session, with an increase to 147 bpm during gait training.    Stairs            Wheelchair Mobility    Modified Rankin (Stroke Patients Only)       Balance Overall balance assessment: Needs assistance Sitting-balance support: Feet supported;No upper extremity supported Sitting balance-Leahy Scale: Fair     Standing balance support: No upper extremity supported;During functional activity Standing balance-Leahy Scale: Poor Standing balance comment: dynamic                            Cognition Arousal/Alertness: Awake/alert Behavior During Therapy: WFL for tasks assessed/performed Overall Cognitive Status: Within Functional Limits for tasks assessed                                        Exercises      General Comments        Pertinent Vitals/Pain Pain Assessment: 0-10 Pain Score: 4  Faces Pain Scale: Hurts little more Pain Location: Incision site/back Pain Descriptors / Indicators: Operative site guarding;Discomfort Pain Intervention(s): Limited activity within patient's tolerance;Monitored during session;Repositioned    Home Living                      Prior Function            PT Goals (current goals can now be found in the care plan section) Acute Rehab PT Goals Patient Stated  Goal: be independent PT Goal Formulation: With patient Time For Goal Achievement: 06/08/16 Potential to Achieve Goals: Good Progress towards PT goals: Progressing toward goals    Frequency    Min 5X/week      PT Plan Current plan remains appropriate    Co-evaluation              AM-PAC PT "6 Clicks" Daily Activity  Outcome Measure  Difficulty turning over in bed (including adjusting bedclothes, sheets and blankets)?: A Little Difficulty moving from lying on back to sitting on the  side of the bed? : A Little Difficulty sitting down on and standing up from a chair with arms (e.g., wheelchair, bedside commode, etc,.)?: A Little Help needed moving to and from a bed to chair (including a wheelchair)?: A Little Help needed walking in hospital room?: A Little Help needed climbing 3-5 steps with a railing? : A Lot 6 Click Score: 17    End of Session Equipment Utilized During Treatment: Back brace Activity Tolerance: Patient tolerated treatment well Patient left: in bed;with call bell/phone within reach Nurse Communication: Mobility status PT Visit Diagnosis: Unsteadiness on feet (R26.81);Pain;Muscle weakness (generalized) (M62.81) Pain - Right/Left: Left Pain - part of body:  (back)     Time: 1610-9604 PT Time Calculation (min) (ACUTE ONLY): 18 min  Charges:  $Gait Training: 8-22 mins                    G Codes:       Conni Slipper, PT, DPT Acute Rehabilitation Services Pager: [phone]    [name] 06/03/2016, 11:48 AM

## 2016-06-03 NOTE — Discharge Instructions (Signed)

## 2016-10-28 ENCOUNTER — Emergency Department (HOSPITAL_COMMUNITY): Payer: Medicare Other

## 2016-10-28 ENCOUNTER — Inpatient Hospital Stay (HOSPITAL_COMMUNITY): Payer: Medicare Other

## 2016-10-28 ENCOUNTER — Encounter (HOSPITAL_COMMUNITY): Payer: Self-pay | Admitting: *Deleted

## 2016-10-28 ENCOUNTER — Inpatient Hospital Stay (HOSPITAL_COMMUNITY)
Admission: EM | Admit: 2016-10-28 | Discharge: 2016-11-03 | DRG: 064 | Disposition: A | Discharge status: Rehabilitation Facility | Payer: Medicare Other | Attending: Internal Medicine | Admitting: Internal Medicine

## 2016-10-28 DIAGNOSIS — R109 Unspecified abdominal pain: Secondary | ICD-10-CM

## 2016-10-28 DIAGNOSIS — E876 Hypokalemia: Secondary | ICD-10-CM | POA: Diagnosis present

## 2016-10-28 DIAGNOSIS — I63112 Cerebral infarction due to embolism of left vertebral artery: Secondary | ICD-10-CM | POA: Diagnosis not present

## 2016-10-28 DIAGNOSIS — Z9889 Other specified postprocedural states: Secondary | ICD-10-CM | POA: Diagnosis not present

## 2016-10-28 DIAGNOSIS — I6330 Cerebral infarction due to thrombosis of unspecified cerebral artery: Secondary | ICD-10-CM | POA: Insufficient documentation

## 2016-10-28 DIAGNOSIS — I69398 Other sequelae of cerebral infarction: Secondary | ICD-10-CM | POA: Diagnosis not present

## 2016-10-28 DIAGNOSIS — R1032 Left lower quadrant pain: Secondary | ICD-10-CM | POA: Diagnosis not present

## 2016-10-28 DIAGNOSIS — R0689 Other abnormalities of breathing: Secondary | ICD-10-CM | POA: Diagnosis present

## 2016-10-28 DIAGNOSIS — D62 Acute posthemorrhagic anemia: Secondary | ICD-10-CM | POA: Diagnosis present

## 2016-10-28 DIAGNOSIS — G9340 Encephalopathy, unspecified: Secondary | ICD-10-CM | POA: Diagnosis present

## 2016-10-28 DIAGNOSIS — I639 Cerebral infarction, unspecified: Secondary | ICD-10-CM | POA: Diagnosis present

## 2016-10-28 DIAGNOSIS — J9601 Acute respiratory failure with hypoxia: Secondary | ICD-10-CM | POA: Diagnosis present

## 2016-10-28 DIAGNOSIS — E785 Hyperlipidemia, unspecified: Secondary | ICD-10-CM | POA: Diagnosis present

## 2016-10-28 DIAGNOSIS — K5900 Constipation, unspecified: Secondary | ICD-10-CM | POA: Diagnosis present

## 2016-10-28 DIAGNOSIS — I6502 Occlusion and stenosis of left vertebral artery: Secondary | ICD-10-CM | POA: Diagnosis present

## 2016-10-28 DIAGNOSIS — R471 Dysarthria and anarthria: Secondary | ICD-10-CM | POA: Diagnosis present

## 2016-10-28 DIAGNOSIS — I6501 Occlusion and stenosis of right vertebral artery: Secondary | ICD-10-CM | POA: Diagnosis not present

## 2016-10-28 DIAGNOSIS — I10 Essential (primary) hypertension: Secondary | ICD-10-CM | POA: Diagnosis present

## 2016-10-28 DIAGNOSIS — D72829 Elevated white blood cell count, unspecified: Secondary | ICD-10-CM

## 2016-10-28 DIAGNOSIS — J9600 Acute respiratory failure, unspecified whether with hypoxia or hypercapnia: Secondary | ICD-10-CM | POA: Diagnosis not present

## 2016-10-28 DIAGNOSIS — R1314 Dysphagia, pharyngoesophageal phase: Secondary | ICD-10-CM | POA: Diagnosis present

## 2016-10-28 DIAGNOSIS — R2689 Other abnormalities of gait and mobility: Secondary | ICD-10-CM | POA: Diagnosis not present

## 2016-10-28 DIAGNOSIS — R739 Hyperglycemia, unspecified: Secondary | ICD-10-CM | POA: Diagnosis present

## 2016-10-28 DIAGNOSIS — R1312 Dysphagia, oropharyngeal phase: Secondary | ICD-10-CM | POA: Diagnosis not present

## 2016-10-28 DIAGNOSIS — I69322 Dysarthria following cerebral infarction: Secondary | ICD-10-CM

## 2016-10-28 DIAGNOSIS — R269 Unspecified abnormalities of gait and mobility: Secondary | ICD-10-CM | POA: Diagnosis not present

## 2016-10-28 DIAGNOSIS — I700 Atherosclerosis of aorta: Secondary | ICD-10-CM | POA: Diagnosis present

## 2016-10-28 DIAGNOSIS — I69391 Dysphagia following cerebral infarction: Secondary | ICD-10-CM | POA: Diagnosis not present

## 2016-10-28 DIAGNOSIS — R4182 Altered mental status, unspecified: Secondary | ICD-10-CM | POA: Diagnosis not present

## 2016-10-28 LAB — COMPREHENSIVE METABOLIC PANEL
ALT: 21 U/L (ref 14–54)
AST: 32 U/L (ref 15–41)
Albumin: 4.4 g/dL (ref 3.5–5.0)
Alkaline Phosphatase: 116 U/L (ref 38–126)
Anion gap: 16 — ABNORMAL HIGH (ref 5–15)
BILIRUBIN TOTAL: 0.8 mg/dL (ref 0.3–1.2)
BUN: 24 mg/dL — AB (ref 6–20)
CO2: 18 mmol/L — ABNORMAL LOW (ref 22–32)
CREATININE: 0.94 mg/dL (ref 0.44–1.00)
Calcium: 9.6 mg/dL (ref 8.9–10.3)
Chloride: 101 mmol/L (ref 101–111)
GFR, EST NON AFRICAN AMERICAN: 58 mL/min — AB (ref >60)
Glucose, Bld: 166 mg/dL — ABNORMAL HIGH (ref 65–99)
POTASSIUM: 2.6 mmol/L — AB (ref 3.5–5.1)
Sodium: 135 mmol/L (ref 135–145)
TOTAL PROTEIN: 7.8 g/dL (ref 6.5–8.1)

## 2016-10-28 LAB — I-STAT CHEM 8, ED
BUN: 23 mg/dL — AB (ref 6–20)
CALCIUM ION: 1.11 mmol/L — AB (ref 1.15–1.40)
CHLORIDE: 106 mmol/L (ref 101–111)
Creatinine, Ser: 0.8 mg/dL (ref 0.44–1.00)
Glucose, Bld: 164 mg/dL — ABNORMAL HIGH (ref 65–99)
HEMATOCRIT: 33 % — AB (ref 36.0–46.0)
Hemoglobin: 11.2 g/dL — ABNORMAL LOW (ref 12.0–15.0)
POTASSIUM: 2.6 mmol/L — AB (ref 3.5–5.1)
SODIUM: 141 mmol/L (ref 135–145)
TCO2: 19 mmol/L — ABNORMAL LOW (ref 22–32)

## 2016-10-28 LAB — DIFFERENTIAL
BASOS PCT: 0 %
Basophils Absolute: 0 10*3/uL (ref 0.0–0.1)
EOS ABS: 0 10*3/uL (ref 0.0–0.7)
EOS PCT: 0 %
Lymphocytes Relative: 44 %
Lymphs Abs: 5.4 10*3/uL — ABNORMAL HIGH (ref 0.7–4.0)
MONO ABS: 0.9 10*3/uL (ref 0.1–1.0)
MONOS PCT: 7 %
Neutro Abs: 6 10*3/uL (ref 1.7–7.7)
Neutrophils Relative %: 49 %

## 2016-10-28 LAB — CBC
HEMATOCRIT: 36.7 % (ref 36.0–46.0)
Hemoglobin: 12.7 g/dL (ref 12.0–15.0)
MCH: 30.1 pg (ref 26.0–34.0)
MCHC: 34.6 g/dL (ref 30.0–36.0)
MCV: 87 fL (ref 78.0–100.0)
Platelets: 344 10*3/uL (ref 150–400)
RBC: 4.22 MIL/uL (ref 3.87–5.11)
RDW: 14.8 % (ref 11.5–15.5)
WBC: 12.3 10*3/uL — ABNORMAL HIGH (ref 4.0–10.5)

## 2016-10-28 LAB — I-STAT ARTERIAL BLOOD GAS, ED
Acid-Base Excess: 2 mmol/L (ref 0.0–2.0)
BICARBONATE: 28.3 mmol/L — AB (ref 20.0–28.0)
O2 Saturation: 100 %
PCO2 ART: 48.4 mmHg — AB (ref 32.0–48.0)
PH ART: 7.37 (ref 7.350–7.450)
PO2 ART: 204 mmHg — AB (ref 83.0–108.0)
Patient temperature: 96.4
TCO2: 30 mmol/L (ref 22–32)

## 2016-10-28 LAB — PROTIME-INR
INR: 0.97
PROTHROMBIN TIME: 12.8 s (ref 11.4–15.2)

## 2016-10-28 LAB — CBG MONITORING, ED: GLUCOSE-CAPILLARY: 187 mg/dL — AB (ref 65–99)

## 2016-10-28 LAB — APTT: aPTT: 25 seconds (ref 24–36)

## 2016-10-28 LAB — I-STAT TROPONIN, ED: Troponin i, poc: 0 ng/mL (ref 0.00–0.08)

## 2016-10-28 MED ORDER — FENTANYL CITRATE (PF) 100 MCG/2ML IJ SOLN
INTRAMUSCULAR | Status: AC
  Filled 2016-10-28: qty 2

## 2016-10-28 MED ORDER — LABETALOL HCL 5 MG/ML IV SOLN: 10.0000 mg | Freq: Once | INTRAVENOUS | Status: DC

## 2016-10-28 MED ORDER — PROPOFOL 1000 MG/100ML IV EMUL
INTRAVENOUS | Status: DC | PRN
  Administered 2016-10-28: 10 ug/kg/min via INTRAVENOUS

## 2016-10-28 MED ORDER — SODIUM CHLORIDE 0.9 % IV SOLN
1.0000 g | Freq: Once | INTRAVENOUS | Status: AC
  Administered 2016-10-29: 1 g via INTRAVENOUS
  Filled 2016-10-28: qty 10

## 2016-10-28 MED ORDER — SODIUM CHLORIDE 0.9 % IV SOLN
INTRAVENOUS | Status: DC
  Administered 2016-10-28 – 2016-10-30 (×4): via INTRAVENOUS

## 2016-10-28 MED ORDER — FENTANYL CITRATE (PF) 100 MCG/2ML IJ SOLN: 50.0000 ug | INTRAMUSCULAR | Status: DC | PRN

## 2016-10-28 MED ORDER — FENTANYL CITRATE (PF) 100 MCG/2ML IJ SOLN
INTRAMUSCULAR | Status: DC | PRN
  Administered 2016-10-28: 50 ug via INTRAVENOUS

## 2016-10-28 MED ORDER — INSULIN ASPART 100 UNIT/ML ~~LOC~~ SOLN
0.0000 [IU] | SUBCUTANEOUS | Status: DC
  Administered 2016-10-29 – 2016-11-03 (×9): 2 [IU] via SUBCUTANEOUS
  Administered 2016-11-03: 3 [IU] via SUBCUTANEOUS
  Administered 2016-11-03: 2 [IU] via SUBCUTANEOUS

## 2016-10-28 MED ORDER — ROCURONIUM BROMIDE 50 MG/5ML IV SOLN
INTRAVENOUS | Status: DC | PRN
  Administered 2016-10-28: 70 mg via INTRAVENOUS

## 2016-10-28 MED ORDER — SODIUM CHLORIDE 0.9 % IV SOLN: 250.0000 mL | INTRAVENOUS | Status: DC | PRN

## 2016-10-28 MED ORDER — LORAZEPAM 2 MG/ML IJ SOLN
INTRAMUSCULAR | Status: AC
  Filled 2016-10-28: qty 1

## 2016-10-28 MED ORDER — PANTOPRAZOLE SODIUM 40 MG IV SOLR: 40.0000 mg | Freq: Every day | INTRAVENOUS | Status: DC

## 2016-10-28 MED ORDER — POTASSIUM CHLORIDE 20 MEQ/15ML (10%) PO SOLN: 40.0000 meq | Freq: Once | ORAL | Status: DC

## 2016-10-28 MED ORDER — PROPOFOL 1000 MG/100ML IV EMUL
0.0000 ug/kg/min | INTRAVENOUS | Status: DC
  Administered 2016-10-29: 40 ug/kg/min via INTRAVENOUS
  Administered 2016-10-29: 50 ug/kg/min via INTRAVENOUS
  Filled 2016-10-28 (×2): qty 100

## 2016-10-28 MED ORDER — CLEVIDIPINE BUTYRATE 0.5 MG/ML IV EMUL
INTRAVENOUS | Status: AC
  Filled 2016-10-28: qty 50

## 2016-10-28 MED ORDER — HEPARIN SODIUM (PORCINE) 5000 UNIT/ML IJ SOLN
5000.0000 [IU] | Freq: Three times a day (TID) | INTRAMUSCULAR | Status: DC
  Administered 2016-10-29 – 2016-11-03 (×17): 5000 [IU] via SUBCUTANEOUS
  Filled 2016-10-28 (×20): qty 1

## 2016-10-28 MED ORDER — POTASSIUM CHLORIDE 20 MEQ/15ML (10%) PO SOLN
40.0000 meq | ORAL | Status: AC
  Administered 2016-10-29: 40 meq
  Filled 2016-10-28: qty 30

## 2016-10-28 MED ORDER — LORAZEPAM 2 MG/ML IJ SOLN
1.0000 mg | Freq: Once | INTRAMUSCULAR | Status: AC
  Administered 2016-10-28: 1 mg via INTRAVENOUS

## 2016-10-28 MED ORDER — PROPOFOL 1000 MG/100ML IV EMUL
INTRAVENOUS | Status: AC
  Administered 2016-10-29: 50 ug/kg/min via INTRAVENOUS
  Filled 2016-10-28: qty 100

## 2016-10-28 MED ORDER — CLEVIDIPINE BUTYRATE 0.5 MG/ML IV EMUL: 0.0000 mg/h | INTRAVENOUS | Status: DC

## 2016-10-28 MED ORDER — ETOMIDATE 2 MG/ML IV SOLN
INTRAVENOUS | Status: DC | PRN
  Administered 2016-10-28: 20 mg via INTRAVENOUS

## 2016-10-28 MED ORDER — LIDOCAINE HCL (PF) 1 % IJ SOLN: 5.0000 mL | Freq: Once | INTRAMUSCULAR | Status: DC

## 2016-10-28 MED ORDER — CLEVIDIPINE BUTYRATE 0.5 MG/ML IV EMUL
0.0000 mg/h | INTRAVENOUS | Status: DC
  Administered 2016-10-28: 1 mg/h via INTRAVENOUS

## 2016-10-28 MED ORDER — IOPAMIDOL (ISOVUE-370) INJECTION 76%
INTRAVENOUS | Status: AC
  Administered 2016-10-28: 50 mL
  Filled 2016-10-28: qty 50

## 2016-10-28 MED ORDER — LIDOCAINE HCL (PF) 1 % IJ SOLN
INTRAMUSCULAR | Status: AC
  Filled 2016-10-28: qty 5

## 2016-10-28 NOTE — ED Notes (Signed)
CCM at bedside 

## 2016-10-28 NOTE — Progress Notes (Signed)
Patient intubated at 2025 by ED physician. 7.5 ETT, 24 at lip.

## 2016-10-28 NOTE — ED Notes (Signed)
Pt difficult to perform NIH, has good strength in bilateral arms, although drifts to bed immediately, unable to hold legs up from bed

## 2016-10-28 NOTE — Progress Notes (Signed)
Pharmacy Antibiotic Note  [name] is a 76 y.o. female admitted on 10/28/2016 with suspected meningitis.  Pharmacy has been consulted for vancomycin, ceftriaxone, ampicillin, and acyclovir dosing. Original consult to wait for LP before starting abx, however, unable to get LP at this time due to previous back surgery. Per EDP/Dr. Otelia LimesLindzen, to begin abx now.   Temp 96.6 and WBC 12.3. SCr 0.8 for estimated CrCl ~ 50-55 mL/min.   Plan: Vancomycin 1250 mg IV x1, then 500mg  IV q12hr  Ceftriaxone 2g IV q12hr Ampicillin 2g IV q4hr Acyclovir 10 mg/kg IV q8hr Vancomycin trough at SS and as needed (goal 15-20 mcg/mL) Ensure adequate hydration while receiving acyclovir Monitor renal function closely and adjust abx as needed F/u culture data and clinical picture, de-escalate as needed   Weight: 147 lb 14.9 oz (67.1 kg)  Temp (24hrs), Avg:96.6 F (35.9 C), Min:96.3 F (35.7 C), Max:97.5 F (36.4 C)   Recent Labs Lab 10/28/16 1930 10/28/16 2003  WBC 12.3*  --   CREATININE 0.94 0.80    CrCl cannot be calculated (Unknown ideal weight.).    No Known Allergies  Antimicrobials this admission: 10/18 Vanc >>  10/18 Amp >> 10/18 CTX >> 10/18 Acyclovir >>   Microbiology results: pending   Einar CrowKatherine Mertis Mosher, PharmD Clinical Pharmacist 10/29/16 12:08 AM

## 2016-10-28 NOTE — ED Notes (Signed)
Dr.Tegeler made aware of hypertension

## 2016-10-28 NOTE — ED Provider Notes (Signed)
[facility] EMERGENCY DEPARTMENT Provider Note   CSN: [id number] Arrival date & time: 10/28/16  1907     History   Chief Complaint Chief Complaint  Patient presents with  . Altered Mental Status  . Code Stroke    HPI [name] is a 76 y.o. female.  The history is provided by the EMS personnel, the patient, a friend, a relative and medical records. The history is limited by the condition of the patient.  Altered Mental Status   This is a new problem. The current episode started 1 to 2 hours ago. The problem has been gradually worsening. Associated symptoms include confusion. Pertinent negatives include no seizures, no unresponsiveness, no weakness, no agitation and no delusions. Her past medical history is significant for hypertension. Her past medical history does not include diabetes, seizures, CVA, TIA, COPD, dementia or head trauma.  Neurologic Problem  This is a new problem. The current episode started 1 to 2 hours ago. The problem occurs constantly. The problem has been gradually worsening. Pertinent negatives include no chest pain and no shortness of breath. Nothing aggravates the symptoms. Nothing relieves the symptoms. She has tried nothing for the symptoms.    No past medical history on file.  Per fam, HTN   There are no active problems to display for this patient.   Past Surgical History:  Procedure Laterality Date  . BACK SURGERY      OB History    No data available       Home Medications    Prior to Admission medications   Not on File    Family History No family history on file.  Social History Social History  Substance Use Topics  . Smoking status: Not on file  . Smokeless tobacco: Not on file  . Alcohol use Not on file     Allergies   Patient has no allergy information on record.   Review of Systems Review of Systems  Unable to perform ROS: Mental status change  Constitutional: Negative for fever.    Respiratory: Negative for cough, chest tightness and shortness of breath.   Cardiovascular: Negative for chest pain.  Neurological: Negative for seizures and weakness.  Psychiatric/Behavioral: Positive for confusion. Negative for agitation.     Physical Exam Updated Vital Signs BP (!) 178/107   Pulse (!) 106   Temp (!) 97.5 F (36.4 C)   Resp 14   Wt 67.1 kg (147 lb 14.9 oz)   SpO2 96%   Physical Exam  Constitutional: She appears well-developed and well-nourished. She appears distressed.  HENT:  Head: Normocephalic and atraumatic.  Mouth/Throat: Oropharynx is clear and moist. No oropharyngeal exudate.  Eyes: Pupils are equal, round, and reactive to light. Conjunctivae and EOM are normal.  Neck: Normal range of motion.  Cardiovascular: Normal rate and intact distal pulses.   No murmur heard. Pulmonary/Chest: Effort normal. No stridor. No respiratory distress. She exhibits no tenderness.  Abdominal: Soft. Bowel sounds are normal. She exhibits no distension. There is no tenderness.  Musculoskeletal: She exhibits no edema or tenderness.  Neurological: She is alert. She is disoriented. No cranial nerve deficit or sensory deficit. She exhibits normal muscle tone. GCS eye subscore is 4. GCS verbal subscore is 4. GCS motor subscore is 5.  Coordination difficult to assess as patient is alert but not following commands. Patient is disoriented and perseverate on needing help. No dysarthria but partial aphasia. Patient had no tremor. Patient moving all extremities. Patient will  move all extremities with pain, suspect sensation intact.  Skin: Capillary refill takes less than 2 seconds. No rash noted. She is not diaphoretic. No erythema.  Nursing note and vitals reviewed.    ED Treatments / Results  Labs (all labs ordered are listed, but only abnormal results are displayed) Labs Reviewed  COMPREHENSIVE METABOLIC PANEL - Abnormal; Notable for the following:       Result Value   Potassium  2.6 (*)    CO2 18 (*)    Glucose, Bld 166 (*)    BUN 24 (*)    GFR calc non Af Amer 58 (*)    Anion gap 16 (*)    All other components within normal limits  CBC - Abnormal; Notable for the following:    WBC 12.3 (*)    All other components within normal limits  DIFFERENTIAL - Abnormal; Notable for the following:    Lymphs Abs 5.4 (*)    All other components within normal limits  CBC - Abnormal; Notable for the following:    WBC 15.6 (*)    All other components within normal limits  BASIC METABOLIC PANEL - Abnormal; Notable for the following:    Sodium 134 (*)    Potassium 3.3 (*)    Chloride 97 (*)    Glucose, Bld 164 (*)    BUN 23 (*)    All other components within normal limits  BLOOD GAS, ARTERIAL - Abnormal; Notable for the following:    pO2, Arterial 220 (*)    Bicarbonate 28.7 (*)    Acid-Base Excess 4.3 (*)    All other components within normal limits  TRIGLYCERIDES - Abnormal; Notable for the following:    Triglycerides 267 (*)    All other components within normal limits  HEMOGLOBIN A1C - Abnormal; Notable for the following:    Hgb A1c MFr Bld 5.8 (*)    All other components within normal limits  GLUCOSE, CAPILLARY - Abnormal; Notable for the following:    Glucose-Capillary 114 (*)    All other components within normal limits  GLUCOSE, CAPILLARY - Abnormal; Notable for the following:    Glucose-Capillary 100 (*)    All other components within normal limits  CBG MONITORING, ED - Abnormal; Notable for the following:    Glucose-Capillary 187 (*)    All other components within normal limits  I-STAT CHEM 8, ED - Abnormal; Notable for the following:    Potassium 2.6 (*)    BUN 23 (*)    Glucose, Bld 164 (*)    Calcium, Ion 1.11 (*)    TCO2 19 (*)    Hemoglobin 11.2 (*)    HCT 33.0 (*)    All other components within normal limits  I-STAT ARTERIAL BLOOD GAS, ED - Abnormal; Notable for the following:    pCO2 arterial 48.4 (*)    pO2, Arterial 204.0 (*)     Bicarbonate 28.3 (*)    All other components within normal limits  MRSA PCR SCREENING  CULTURE, BLOOD (ROUTINE X 2)  CULTURE, BLOOD (ROUTINE X 2)  PROTIME-INR  APTT  MAGNESIUM  PHOSPHORUS  PROCALCITONIN  I-STAT TROPONIN, ED  CBG MONITORING, ED    EKG  EKG Interpretation  Date/Time:  Wednesday October 28 2016 19:14:05 EDT Ventricular Rate:  96 PR Interval:    QRS Duration: 92 QT Interval:  403 QTC Calculation: 510 R Axis:   59 Text Interpretation:  Sinus rhythm Repolarization abnormality, prob rate related Prolonged QT interval No prior  ECG for comparison.  No STEMI Confirmed by Theda Belfast ([id number]) on 10/28/2016 7:17:49 PM       Radiology Ct Angio Head W Or Wo Contrast  Result Date: 10/28/2016 CLINICAL DATA:  Altered mental status.  Possible nystagmus. EXAM: CT ANGIOGRAPHY HEAD AND NECK TECHNIQUE: Multidetector CT imaging of the head and neck was performed using the standard protocol during bolus administration of intravenous contrast. Multiplanar CT image reconstructions and MIPs were obtained to evaluate the vascular anatomy. Carotid stenosis measurements (when applicable) are obtained utilizing NASCET criteria, using the distal internal carotid diameter as the denominator. CONTRAST:  50 mL Isovue 370 COMPARISON:  Head CT earlier the same day FINDINGS: CTA NECK FINDINGS Aortic arch: There is mild calcific atherosclerosis of the aortic arch. There is no aneurysm, dissection or hemodynamically significant stenosis of the visualized ascending aorta and aortic arch. Conventional 3 vessel aortic branching pattern. The visualized proximal subclavian arteries are widely patent. Right carotid system: The right common carotid origin is widely patent. There is no common carotid or internal carotid artery dissection or aneurysm. No hemodynamically significant stenosis. Left carotid system: The left common carotid origin is widely patent. There is no common carotid or internal carotid artery  dissection or aneurysm. No hemodynamically significant stenosis. Vertebral arteries: The vertebral system is right dominant. Both vertebral artery origins are patent. 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 There is no distal opacification. The entire right vertebral artery is normal. Skeleton: There is no bony spinal canal stenosis. No lytic or blastic lesions. Other neck: The nasopharynx is clear. The oropharynx and hypopharynx are normal. The epiglottis is normal. The supraglottic larynx, glottis and subglottic larynx are normal. No retropharyngeal collection. The parapharyngeal spaces are preserved. The parotid and submandibular glands are normal. No sialolithiasis or salivary ductal dilatation. The thyroid gland is normal. There is no cervical lymphadenopathy. Upper chest: Biapical septal thickening, possibly mild pulmonary edema. Review of the MIP images confirms the above findings CTA HEAD FINDINGS Anterior circulation: --Intracranial internal carotid arteries: Mild atherosclerotic calcification of the internal carotid arteries at the skullbase without significant stenosis. --Anterior cerebral arteries: Normal. --Middle cerebral arteries: Normal. --Posterior communicating arteries: Present on the left, absent on the right. Posterior circulation: --Posterior cerebral arteries: Normal. --Superior cerebellar arteries: Normal. --Basilar artery: Normal. --Anterior inferior cerebellar arteries: Normal. --Posterior inferior cerebellar arteries: Normal right PICA. Slight opacification of left PICA. Venous sinuses: As permitted by contrast timing, patent. Anatomic variants: None Delayed phase: Not performed. Review of the MIP images confirms the above findings IMPRESSION: 1. 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 Otherwise, no large vessel occlusion or high-grade stenosis. 2. Attenuated opacification of the left PICA. Otherwise, the intracranial posterior  circulation is normal. 3.  Aortic Atherosclerosis (ICD10-I70.0). These results were called by telephone at the time of interpretation on 10/28/2016 at 8:36 pm to Dr. Caryl Pina , who verbally acknowledged these results. Electronically Signed   By: Deatra Robinson M.D.   On: 10/28/2016 20:44   Ct Angio Neck W And/or Wo Contrast  Result Date: 10/28/2016 CLINICAL DATA:  Altered mental status.  Possible nystagmus. EXAM: CT ANGIOGRAPHY HEAD AND NECK TECHNIQUE: Multidetector CT imaging of the head and neck was performed using the standard protocol during bolus administration of intravenous contrast. Multiplanar CT image reconstructions and MIPs were obtained to evaluate the vascular anatomy. Carotid stenosis measurements (when applicable) are obtained utilizing NASCET criteria, using the distal internal carotid diameter as the denominator. CONTRAST:  50 mL Isovue 370 COMPARISON:  Head CT earlier the same day FINDINGS: CTA NECK FINDINGS Aortic arch: There is mild calcific atherosclerosis of the aortic arch. There is no aneurysm, dissection or hemodynamically significant stenosis of the visualized ascending aorta and aortic arch. Conventional 3 vessel aortic branching pattern. The visualized proximal subclavian arteries are widely patent. Right carotid system: The right common carotid origin is widely patent. There is no common carotid or internal carotid artery dissection or aneurysm. No hemodynamically significant stenosis. Left carotid system: The left common carotid origin is widely patent. There is no common carotid or internal carotid artery dissection or aneurysm. No hemodynamically significant stenosis. Vertebral arteries: The vertebral system is right dominant. Both vertebral artery origins are patent. 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 There is no distal opacification. The entire right vertebral artery is normal. Skeleton: There is no bony spinal canal stenosis. No lytic or blastic  lesions. Other neck: The nasopharynx is clear. The oropharynx and hypopharynx are normal. The epiglottis is normal. The supraglottic larynx, glottis and subglottic larynx are normal. No retropharyngeal collection. The parapharyngeal spaces are preserved. The parotid and submandibular glands are normal. No sialolithiasis or salivary ductal dilatation. The thyroid gland is normal. There is no cervical lymphadenopathy. Upper chest: Biapical septal thickening, possibly mild pulmonary edema. Review of the MIP images confirms the above findings CTA HEAD FINDINGS Anterior circulation: --Intracranial internal carotid arteries: Mild atherosclerotic calcification of the internal carotid arteries at the skullbase without significant stenosis. --Anterior cerebral arteries: Normal. --Middle cerebral arteries: Normal. --Posterior communicating arteries: Present on the left, absent on the right. Posterior circulation: --Posterior cerebral arteries: Normal. --Superior cerebellar arteries: Normal. --Basilar artery: Normal. --Anterior inferior cerebellar arteries: Normal. --Posterior inferior cerebellar arteries: Normal right PICA. Slight opacification of left PICA. Venous sinuses: As permitted by contrast timing, patent. Anatomic variants: None Delayed phase: Not performed. Review of the MIP images confirms the above findings IMPRESSION: 1. 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 Otherwise, no large vessel occlusion or high-grade stenosis. 2. Attenuated opacification of the left PICA. Otherwise, the intracranial posterior circulation is normal. 3.  Aortic Atherosclerosis (ICD10-I70.0). These results were called by telephone at the time of interpretation on 10/28/2016 at 8:36 pm to Dr. Caryl Pina , who verbally acknowledged these results. Electronically Signed   By: Deatra Robinson M.D.   On: 10/28/2016 20:44   Dg Chest Portable 1 View  Result Date: 10/28/2016 CLINICAL DATA:  Endotracheal  tube adjustment EXAM: PORTABLE CHEST 1 VIEW COMPARISON:  Chest radiograph 10/28/2016 at 9:01 p.m. FINDINGS: Endotracheal tube has been retracted and now ends 2 cm above the carina. The left lung is now aerated. Persistent atelectasis at the left lung base. The endotracheal tube could be retracted another 2 cm for optimal positioning. Orogastric tube side port overlies the stomach. IMPRESSION: Endotracheal tube tip 2 cm above the carina with aeration of the left lung. The tube could be retracted another 2 cm for optimal positioning. Electronically Signed   By: Deatra Robinson M.D.   On: 10/28/2016 21:33   Dg Chest Portable 1 View  Result Date: 10/28/2016 CLINICAL DATA:  Endotracheal and orogastric tube placement EXAM: PORTABLE CHEST 1 VIEW COMPARISON:  None. FINDINGS: There is whiteout of the left lung limiting assessment. An endotracheal tube tip appears project within the proximal right mainstem bronchus and withdrawal approximately 4.3 cm is suggested. Right lung remains clear. The patient is slightly tilted to the left and  rotated on current exam. A gastric tube is seen coiled in the expected location of the stomach. The tip is excluded. IMPRESSION: 1. The tip of an endotracheal tube appears to be in the proximal right mainstem bronchus. Pullback of the tube approximately 4.3 cm suggested. 2. Complete whiteout of the left hemithorax. 3. Gastric tube extends into the expected location the stomach. Electronically Signed   By: Tollie Eth M.D.   On: 10/28/2016 21:32   Dg Abd Portable 1 View  Result Date: 10/28/2016 CLINICAL DATA:  Gastric tube placement. EXAM: PORTABLE ABDOMEN - 1 VIEW COMPARISON:  None. FINDINGS: The tip of a gastric tube is seen projecting in the left lower quadrant of the abdomen with side-port in the left mid abdomen. The patient may have a J-shaped stomach accounting for this appearance, an anatomic variant. No free air. Posterior lumbar fusion hardware from L2 through L5. Surgical  clips are seen in the right upper quadrant. IMPRESSION: 1. The tip and side port of a gastric tube are seen in the left hemiabdomen. 2. L2 through L5 posterior lumbar fusion. Electronically Signed   By: Tollie Eth M.D.   On: 10/28/2016 21:36   Ct Head Code Stroke Wo Contrast  Result Date: 10/28/2016 CLINICAL DATA:  Code stroke.  Altered mental status EXAM: CT HEAD WITHOUT CONTRAST TECHNIQUE: Contiguous axial images were obtained from the base of the skull through the vertex without intravenous contrast. COMPARISON:  None. FINDINGS: Brain: No mass lesion or acute hemorrhage. No focal hypoattenuation of the basal ganglia or cortex to indicate infarcted tissue. No hydrocephalus or age advanced atrophy. There is periventricular hypoattenuation compatible with chronic microvascular disease. Vascular: No hyperdense vessel. No advanced atherosclerotic calcification of the arteries at the skull base. Skull: Normal visualized skull base, calvarium and extracranial soft tissues. Sinuses/Orbits: No sinus fluid levels or advanced mucosal thickening. No mastoid effusion. Normal orbits. ASPECTS [facility] Stroke Program Early CT Score) - Ganglionic level infarction (caudate, lentiform nuclei, internal capsule, insula, M1-M3 cortex): 7 - Supraganglionic infarction (M4-M6 cortex): 3 Total score (0-10 with 10 being normal): 10 IMPRESSION: 1. No acute hemorrhage or mass lesion. 2. Findings of chronic microvascular disease. 3. ASPECTS is 10. Dr. Caryl Pina was paged at 7:58 p.m. on 10/28/2016. Electronically Signed   By: Deatra Robinson M.D.   On: 10/28/2016 19:59    Procedures Procedure Name: Intubation Date/Time: 10/29/2016 11:34 AM Performed by: Heide Scales Pre-anesthesia Checklist: Emergency Drugs available, Patient identified, Patient being monitored and Timeout performed Oxygen Delivery Method: Ambu bag Preoxygenation: Pre-oxygenation with 100% oxygen Induction Type: Rapid sequence Laryngoscope Size:  Glidescope Grade View: Grade I Tube size: 7.5 mm Number of attempts: 1 (glidescope light out initially) Placement Confirmation: ETT inserted through vocal cords under direct vision,  Breath sounds checked- equal and bilateral and Positive ETCO2 Secured at: 24 cm Tube secured with: ETT holder Dental Injury: Teeth and Oropharynx as per pre-operative assessment  Comments: On CXR, Tube in in R mainstem, tube retracted and XR improved.       (including critical care time)  CRITICAL CARE Performed by: Canary Brim Mclane Arora Total critical care time: 60 minutes Critical care time was exclusive of separately billable procedures and treating other patients. Critical care was necessary to treat or prevent imminent or life-threatening deterioration. Critical care was time spent personally by me on the following activities: development of treatment plan with patient and/or surrogate as well as nursing, discussions with consultants, evaluation of patient's response to treatment, examination of patient, obtaining  history from patient or surrogate, ordering and performing treatments and interventions, ordering and review of laboratory studies, ordering and review of radiographic studies, pulse oximetry and re-evaluation of patient's condition.  Medications Ordered in ED Medications  propofol (DIPRIVAN) 1000 MG/100ML infusion (not administered)  propofol (DIPRIVAN) 1000 MG/100ML infusion (10 mcg/kg/min  67.1 kg Intravenous New Bag/Given 10/28/16 2034)  etomidate (AMIDATE) injection (20 mg Intravenous Given 10/28/16 2023)  rocuronium [facility]) injection (70 mg Intravenous Given 10/28/16 2023)  clevidipine (CLEVIPREX) infusion 0.5 mg/mL (not administered)  iopamidol (ISOVUE-370) 76 % injection (50 mLs  Contrast Given 10/28/16 2000)  LORazepam (ATIVAN) injection 1 mg (1 mg Intravenous Given 10/28/16 1957)     Initial Impression / Assessment and Plan / ED Course  I have reviewed the triage vital signs  and the nursing notes.  Pertinent labs & imaging results that were available during my care of the patient were reviewed by me and considered in my medical decision making (see chart for details).     [name] is a 76 y.o. female with a past medical history significant for back surgery 4 months ago presents with altered mental status and a syncopal episode. Patient is brought in by EMS. Patient's friends were at the bedside and they report that at 6 PM, patient was last normal. Patient was at church and then started feeling lightheaded. Patient then staking and hada syncopal episode. Patient was out for less than 1 minute. Patient then had EMS called and she was transported. Patient had some nausea and vomiting. Patient was perseverating asking for help and was disoriented. Patient was not answering questions properly or following commands appropriately. On initial evaluation, due to the patient's disorientation and altered mental status, a code stroke was called.  Patient quickly taken to CT scanner where she had a noncontrast CT. Neurology evaluated her the scanner and agreed with concern for neurologic cause of her symptoms. Patient was getting agitated and unable to obtain imaging. Patient was given 1 mg of Ativan which subsequently her to get the CT scan. Patient had a CTA which did not show evidence of acute large vessel occlusion or dissection.  Upon return from the scanner, patient had worsening mental status. Patient had gurgling with breathing and there is concern for developing aspiration with her worsening mental status. Patient also started having hypoxia. Decision made to intubate patient for airway protection and hypoxia.   Patient intubated with RSI. Post intubation x-ray showed whiteout of left lung. Tube was repositioned and aeration improved. Patient had equal breath sounds bilaterally both before and after retraction.   Neurology recommended patient be admitted for MRI,  EEG, and lumbar puncture. Was going to do a lumbar puncture in the emergency part however, all obtaining consent with family, they reported that patient had lumbar surgery several months ago and had hardware in place. With this discovered, do not feel comfortable doing lumbar puncture in the ED. Patient may need fluoroscopy guided LP if neurology feels this is necessary.  Neurology recommended antibiotics and MRI and admission to critical care service.   Patient will be admitted to critical care service for further management of her altered mental status, encephalopathy, and respiratory failure.  MRI was obtained showing acute strokes. Antibiotics will be discontinued, LP will be cancelled, and pt will be admitted for further management.    Final Clinical Impressions(s) / ED Diagnoses   Final diagnoses:  Altered mental status, unspecified altered mental status type  Acute respiratory failure with hypoxia (HCC)  Acute ischemic stroke (HCC)    Clinical Impression: 1. Altered mental status, unspecified altered mental status type   2. Acute respiratory failure with hypoxia (HCC)   3. Acute ischemic stroke [facility])     Disposition: Admit to Critical care    Laneice Meneely, Canary Brim, MD 10/29/16 1137

## 2016-10-28 NOTE — ED Notes (Signed)
Dr.Tegeler spoke with Barbara CowerJason, pt's son, regarding LP. Due to previous back surgery, LP to be completed under fluoroscopy

## 2016-10-28 NOTE — ED Notes (Signed)
ACTIVATED CODE STROKE WITH CARELINK-TARA  

## 2016-10-28 NOTE — ED Notes (Signed)
Pt in CT with RN very restless, attempting to climb out of bed, not following commands and has incomprehensible speech. Dr.Tegeler in CT with pt, verbal order for ativan obtained.

## 2016-10-28 NOTE — Consult Note (Addendum)
Referring Physician: Dr. Rush Landmark    Chief Complaint: Acute onset of dizziness, N/V and AMS  HPI: [name] is an 76 y.o. female presenting to the [facility] ED via EMS after a short syncopal spell at church, which was followed by gradually worsening confusion and agitation. She had been walking out of church, when the symptoms began. Prior to her syncopal episode, she stated that she felt dizzy. She was noted to be hot and clammy, then vomited. Zofran 4 mg was administered en route.    In the ED a Code Stroke was called for acute confusion. No facial droop or limb weakness was noted.   CT head: No acute hemorrhage or mass lesion. Findings of chronic microvascular disease. ASPECTS is 10.  CTA head and neck:  1. 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 Otherwise, no large vessel occlusion or high-grade stenosis. 2. Attenuated opacification of the left PICA. Otherwise, the intracranial posterior circulation is normal. 3.  Aortic Atherosclerosis  EKG: Sinus rhythm with prolonged QT interval  Labs: Potassium low at 2.6 Glucose 164 Calcium 9.6 BUN 23 Cr 0.8  LSN: 6:00 PM tPA Given: No: Exam findings most consistent with diffuse encephalopathy without lateralized weakness or sensory loss. Wide differential diagnosis for potential etiologies, including meningitis/encephalitis, seizure with postictal state, toxic/metabolic. Stroke felt to be significantly lower on the DDx. Risks of tPA administration outweigh benefits.   No past medical history on file.  Past Surgical History:  Procedure Laterality Date  . BACK SURGERY      No family history on file. Social History:  has no tobacco, alcohol, and drug history on file.  Allergies: Allergies not on file  Home Medications:    ROS: Unable to obtain due to AMS  Physical Examination: Blood pressure (!) 147/64, pulse (!) 104, temperature (!) 97.5 F (36.4 C), resp. rate 16, weight 67.1 kg (147  lb 14.9 oz), SpO2 100 %.  HEENT: Nuchal rigidity. [place]/AT Ext: Warm and well perfused Skin: No rash to face or arms.   Neurologic Examination: Mental Status: Agitated and confused. Not responding to questions except for short, unintelligible, severely dysarthric and hypophonic responses. Moving all 4 extremities with flailing of legs and arms noted at times. Grips rails of bed tightly with both arms. Appears anxious. Does not follow any commands.  Cranial Nerves: II:  No blink to threat. PERRL.  III,IV, VI: Eyes intermittently open. Frequent irregular blinking noted. Occasionally conjugately deviates eyes downwards and horizontally but does not appear to be fixating on visual stimuli and does not track. Eyes at midline, does not fixate or deviate eyes towards stimuli. Unable to overcome with doll's eye maneuver.  V,VII: Decreased tone lower quadrants of face bilaterally. Severe dysarthria. Reacts to brow ridge pressure bilaterally.   VIII: Responds to voice with incomprehensible speech IX,X: Hypophonia and occasional hiccups noted XI: Head turned slightly to right XII: Does not protrude tongue to command Motor: RUE: 5/5 strength while resisting examiner's attempts to passively move limb. Increased tone.  LUE: 5/5 strength while resisting examiner's attempts to passively move limb. Increased tone.  RLE: 5/5 strength while resisting examiner's attempts to passively move limb. Increased tone.  LLE: 5/5 strength while resisting examiner's attempts to passively move limb. Increased tone.  No jerking or twitching noted.  Sensory:  Moves upper extremities noxious.  Withdraws lower extremities briskly to noxious.  Deep Tendon Reflexes:  3+ bilateral upper and lower extremities. Toes upgoing bilaterally.  Cerebellar/Gait: Unable to assess  Results for orders placed or performed during the hospital encounter of 10/28/16 (from the past 48 hour(s))  CBC     Status: Abnormal   Collection Time:  10/28/16  7:30 PM  Result Value Ref Range   WBC 12.3 (H) 4.0 - 10.5 K/uL   RBC 4.22 3.87 - 5.11 MIL/uL   Hemoglobin 12.7 12.0 - 15.0 g/dL   HCT 16.1 09.6 - 04.5 %   MCV 87.0 78.0 - 100.0 fL   MCH 30.1 26.0 - 34.0 pg   MCHC 34.6 30.0 - 36.0 g/dL   RDW 40.9 81.1 - 91.4 %   Platelets 344 150 - 400 K/uL  Differential     Status: Abnormal   Collection Time: 10/28/16  7:30 PM  Result Value Ref Range   Neutrophils Relative % 49 %   Neutro Abs 6.0 1.7 - 7.7 K/uL   Lymphocytes Relative 44 %   Lymphs Abs 5.4 (H) 0.7 - 4.0 K/uL   Monocytes Relative 7 %   Monocytes Absolute 0.9 0.1 - 1.0 K/uL   Eosinophils Relative 0 %   Eosinophils Absolute 0.0 0.0 - 0.7 K/uL   Basophils Relative 0 %   Basophils Absolute 0.0 0.0 - 0.1 K/uL  I-Stat Chem 8, ED     Status: Abnormal   Collection Time: 10/28/16  8:03 PM  Result Value Ref Range   Sodium 141 135 - 145 mmol/L   Potassium 2.6 (LL) 3.5 - 5.1 mmol/L   Chloride 106 101 - 111 mmol/L   BUN 23 (H) 6 - 20 mg/dL   Creatinine, Ser 7.82 0.44 - 1.00 mg/dL   Glucose, Bld 956 (H) 65 - 99 mg/dL   Calcium, Ion 2.13 (L) 1.15 - 1.40 mmol/L   TCO2 19 (L) 22 - 32 mmol/L   Hemoglobin 11.2 (L) 12.0 - 15.0 g/dL   HCT 08.6 (L) 57.8 - 46.9 %   Comment NOTIFIED PHYSICIAN    Ct Head Code Stroke Wo Contrast  Result Date: 10/28/2016 CLINICAL DATA:  Code stroke.  Altered mental status EXAM: CT HEAD WITHOUT CONTRAST TECHNIQUE: Contiguous axial images were obtained from the base of the skull through the vertex without intravenous contrast. COMPARISON:  None. FINDINGS: Brain: No mass lesion or acute hemorrhage. No focal hypoattenuation of the basal ganglia or cortex to indicate infarcted tissue. No hydrocephalus or age advanced atrophy. There is periventricular hypoattenuation compatible with chronic microvascular disease. Vascular: No hyperdense vessel. No advanced atherosclerotic calcification of the arteries at the skull base. Skull: Normal visualized skull base, calvarium  and extracranial soft tissues. Sinuses/Orbits: No sinus fluid levels or advanced mucosal thickening. No mastoid effusion. Normal orbits. ASPECTS [facility] Stroke Program Early CT Score) - Ganglionic level infarction (caudate, lentiform nuclei, internal capsule, insula, M1-M3 cortex): 7 - Supraganglionic infarction (M4-M6 cortex): 3 Total score (0-10 with 10 being normal): 10 IMPRESSION: 1. No acute hemorrhage or mass lesion. 2. Findings of chronic microvascular disease. 3. ASPECTS is 10. Dr. Caryl Pina was paged at 7:58 p.m. on 10/28/2016. Electronically Signed   By: Deatra Robinson M.D.   On: 10/28/2016 19:59    Assessment: 76 y.o. female presenting with acute onset of dizziness, N/V and AMS 1. Exam findings most consistent with diffuse encephalopathy without lateralized weakness or sensory loss. Wide differential diagnosis for potential etiologies, including meningitis/encephalitis, seizure with postictal state, toxic/metabolic. Stroke felt to be significantly lower on the DDx. 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 2. Risks of tPA administration outweigh benefits.  3. Elevated BUN/Cr ratio, suggestive of volume depletion 4. Leukocytosis 5. Hypokalemia 6. Mild hyperglycemia 7. Stroke Risk Factors - None  Plan: 1. Given wide differential diagnosis for her presentation with several potential etiologies being of significantly higher likelihood than stroke, risks of tPA administration are felt to significantly outweigh benefits.  2. Lumbar puncture. Obtain cell count with differential, protein, glucose, HSV PCR, EBV PCR, VZV PCR, gram stain, fungal stain, cryptococcal antigen, bacterial culture, fungal culture. 3. MRI of the brain without contrast 4. EEG 5. Empiric antibiotics for possible meningitis/encephalitis: Vancomycin, ceftriaxone, ampicillin, acyclovir 6. Has been  intubated for airway protection 7. Telemetry monitoring 8. Frequent neuro checks 9. Discussed with Dr. Rush Landmarkegeler  50 minutes spent in the emergent Neurological evaluation and management of this critically ill patient  @Electronically  signed: Dr. Caryl PinaEric Jenise Iannelli@  10/28/2016, 8:09 PM

## 2016-10-28 NOTE — H&P (Addendum)
PULMONARY / CRITICAL CARE MEDICINE   Name: [name] MRN: [id number] DOB: 04/18/1940    ADMISSION DATE:  10/28/2016 CONSULTATION DATE:  10/28/16  REFERRING MD:  Tegeler  CHIEF COMPLAINT:  AMS  HISTORY OF PRESENT ILLNESS:  Pt is encephelopathic; therefore, this HPI is obtained from chart review. [name] is a 76 y.o. female with PMH of HTN.  She was brought to [facility] ED 10/17 after she had a syncopal episode after leaving church.  She apparently was in her usual state of health prior to and during church.  When she walked out, she felt dizzy so tried to sit in a chair.  She then had a vomiting episode followed by syncope and was subsequently sent to the ED for further evaluation.  In ED, code stroke was activated.  She was taken to CT where she became restless with gargled speech and inability to follow commands.  She was subsequently intubated.  CT of the head was negative for acute CVA.  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  PCCM was asked to assist with vent management.   PAST MEDICAL HISTORY :  She  has no past medical history on file.  PAST SURGICAL HISTORY: She  has a past surgical history that includes Back surgery.  Allergies not on file  No current facility-administered medications on file prior to encounter.    No current outpatient prescriptions on file prior to encounter.    FAMILY HISTORY:  Her has no family status information on file.    SOCIAL HISTORY: She    REVIEW OF SYSTEMS:   Unable to obtain as pt is encephalopathi  SUBJECTIVE:  On vent, not following commands.  VITAL SIGNS: BP (!) 169/89   Pulse 91   Temp (!) 96.6 F (35.9 C)   Resp 16   Wt 67.1 kg (147 lb 14.9 oz)   SpO2 100%   HEMODYNAMICS:    VENTILATOR SETTINGS: Vent Mode: PRVC FiO2 (%):  [100 %] 100 % Set Rate:  [14 bmp] 14 bmp Vt Set:  [400 mL] 400 mL PEEP:  [5  cmH20] 5 cmH20 Plateau Pressure:  [16 cmH20] 16 cmH20  INTAKE / OUTPUT: No intake/output data recorded.   PHYSICAL EXAMINATION: General: Elderly female, in NAD. Neuro:Sedated, not following commands. HEENT: [place]/AT. PERRL, sclerae anicteric.  No meningismus. Cardiovascular: RRR, no M/R/G.  Lungs: Respirations even and unlabored.  CTA bilaterally, No W/R/R.  Abdomen: BS x 4, soft, NT/ND.  Musculoskeletal: No gross deformities, no edema.  Skin: Intact, warm, no rashes.  LABS:  BMET  Recent Labs Lab 10/28/16 1930 10/28/16 2003  NA 135 141  K 2.6* 2.6*  CL 101 106  CO2 18*  --   BUN 24* 23*  CREATININE 0.94 0.80  GLUCOSE 166* 164*    Electrolytes  Recent Labs Lab 10/28/16 1930  CALCIUM 9.6    CBC  Recent Labs Lab 10/28/16 1930 10/28/16 2003  WBC 12.3*  --   HGB 12.7 11.2*  HCT 36.7 33.0*  PLT 344  --     Coag's  Recent Labs Lab 10/28/16 1930  APTT 25  INR 0.97    Sepsis Markers No results for input(s): LATICACIDVEN, PROCALCITON, O2SATVEN in the last 168 hours.  ABG  Recent Labs Lab 10/28/16 2135  PHART 7.370  PCO2ART 48.4*  PO2ART 204.0*    Liver Enzymes  Recent Labs Lab 10/28/16 1930  AST 32  ALT 21  ALKPHOS 116  BILITOT 0.8  ALBUMIN 4.4    Cardiac Enzymes No results for input(s): TROPONINI, PROBNP in the last 168 hours.  Glucose  Recent Labs Lab 10/28/16 2036  GLUCAP 187*    Imaging Ct Angio Head W Or Wo Contrast  Result Date: 10/28/2016 CLINICAL DATA:  Altered mental status.  Possible nystagmus. EXAM: CT ANGIOGRAPHY HEAD AND NECK TECHNIQUE: Multidetector CT imaging of the head and neck was performed using the standard protocol during bolus administration of intravenous contrast. Multiplanar CT image reconstructions and MIPs were obtained to evaluate the vascular anatomy. Carotid stenosis measurements (when applicable) are obtained utilizing NASCET criteria, using the distal internal carotid diameter as the  denominator. CONTRAST:  50 mL Isovue 370 COMPARISON:  Head CT earlier the same day FINDINGS: CTA NECK FINDINGS Aortic arch: There is mild calcific atherosclerosis of the aortic arch. There is no aneurysm, dissection or hemodynamically significant stenosis of the visualized ascending aorta and aortic arch. Conventional 3 vessel aortic branching pattern. The visualized proximal subclavian arteries are widely patent. Right carotid system: The right common carotid origin is widely patent. There is no common carotid or internal carotid artery dissection or aneurysm. No hemodynamically significant stenosis. Left carotid system: The left common carotid origin is widely patent. There is no common carotid or internal carotid artery dissection or aneurysm. No hemodynamically significant stenosis. Vertebral arteries: The vertebral system is right dominant. Both vertebral artery origins are patent. 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 There is no distal opacification. The entire right vertebral artery is normal. Skeleton: There is no bony spinal canal stenosis. No lytic or blastic lesions. Other neck: The nasopharynx is clear. The oropharynx and hypopharynx are normal. The epiglottis is normal. The supraglottic larynx, glottis and subglottic larynx are normal. No retropharyngeal collection. The parapharyngeal spaces are preserved. The parotid and submandibular glands are normal. No sialolithiasis or salivary ductal dilatation. The thyroid gland is normal. There is no cervical lymphadenopathy. Upper chest: Biapical septal thickening, possibly mild pulmonary edema. Review of the MIP images confirms the above findings CTA HEAD FINDINGS Anterior circulation: --Intracranial internal carotid arteries: Mild atherosclerotic calcification of the internal carotid arteries at the skullbase without significant stenosis. --Anterior cerebral arteries: Normal. --Middle cerebral arteries: Normal. --Posterior  communicating arteries: Present on the left, absent on the right. Posterior circulation: --Posterior cerebral arteries: Normal. --Superior cerebellar arteries: Normal. --Basilar artery: Normal. --Anterior inferior cerebellar arteries: Normal. --Posterior inferior cerebellar arteries: Normal right PICA. Slight opacification of left PICA. Venous sinuses: As permitted by contrast timing, patent. Anatomic variants: None Delayed phase: Not performed. Review of the MIP images confirms the above findings IMPRESSION: 1. 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 Otherwise, no large vessel occlusion or high-grade stenosis. 2. Attenuated opacification of the left PICA. Otherwise, the intracranial posterior circulation is normal. 3.  Aortic Atherosclerosis (ICD10-I70.0). These results were called by telephone at the time of interpretation on 10/28/2016 at 8:36 pm to Dr. Caryl Pina , who verbally acknowledged these results. Electronically Signed   By: Deatra Robinson M.D.   On: 10/28/2016 20:44   Ct Angio Neck W And/or Wo Contrast  Result Date: 10/28/2016 CLINICAL DATA:  Altered mental status.  Possible nystagmus. EXAM: CT ANGIOGRAPHY HEAD AND NECK TECHNIQUE: Multidetector CT imaging of the head and neck was performed using the standard protocol during bolus administration of intravenous contrast. Multiplanar CT image reconstructions and MIPs were obtained to evaluate the vascular anatomy.  Carotid stenosis measurements (when applicable) are obtained utilizing NASCET criteria, using the distal internal carotid diameter as the denominator. CONTRAST:  50 mL Isovue 370 COMPARISON:  Head CT earlier the same day FINDINGS: CTA NECK FINDINGS Aortic arch: There is mild calcific atherosclerosis of the aortic arch. There is no aneurysm, dissection or hemodynamically significant stenosis of the visualized ascending aorta and aortic arch. Conventional 3 vessel aortic branching pattern. The  visualized proximal subclavian arteries are widely patent. Right carotid system: The right common carotid origin is widely patent. There is no common carotid or internal carotid artery dissection or aneurysm. No hemodynamically significant stenosis. Left carotid system: The left common carotid origin is widely patent. There is no common carotid or internal carotid artery dissection or aneurysm. No hemodynamically significant stenosis. Vertebral arteries: The vertebral system is right dominant. Both vertebral artery origins are patent. 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 There is no distal opacification. The entire right vertebral artery is normal. Skeleton: There is no bony spinal canal stenosis. No lytic or blastic lesions. Other neck: The nasopharynx is clear. The oropharynx and hypopharynx are normal. The epiglottis is normal. The supraglottic larynx, glottis and subglottic larynx are normal. No retropharyngeal collection. The parapharyngeal spaces are preserved. The parotid and submandibular glands are normal. No sialolithiasis or salivary ductal dilatation. The thyroid gland is normal. There is no cervical lymphadenopathy. Upper chest: Biapical septal thickening, possibly mild pulmonary edema. Review of the MIP images confirms the above findings CTA HEAD FINDINGS Anterior circulation: --Intracranial internal carotid arteries: Mild atherosclerotic calcification of the internal carotid arteries at the skullbase without significant stenosis. --Anterior cerebral arteries: Normal. --Middle cerebral arteries: Normal. --Posterior communicating arteries: Present on the left, absent on the right. Posterior circulation: --Posterior cerebral arteries: Normal. --Superior cerebellar arteries: Normal. --Basilar artery: Normal. --Anterior inferior cerebellar arteries: Normal. --Posterior inferior cerebellar arteries: Normal right PICA. Slight opacification of left PICA. Venous sinuses: As permitted by  contrast timing, patent. Anatomic variants: None Delayed phase: Not performed. Review of the MIP images confirms the above findings IMPRESSION: 1. 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 Otherwise, no large vessel occlusion or high-grade stenosis. 2. Attenuated opacification of the left PICA. Otherwise, the intracranial posterior circulation is normal. 3.  Aortic Atherosclerosis (ICD10-I70.0). These results were called by telephone at the time of interpretation on 10/28/2016 at 8:36 pm to Dr. Caryl Pina , who verbally acknowledged these results. Electronically Signed   By: Deatra Robinson M.D.   On: 10/28/2016 20:44   Dg Chest Portable 1 View  Result Date: 10/28/2016 CLINICAL DATA:  Endotracheal tube adjustment EXAM: PORTABLE CHEST 1 VIEW COMPARISON:  Chest radiograph 10/28/2016 at 9:01 p.m. FINDINGS: Endotracheal tube has been retracted and now ends 2 cm above the carina. The left lung is now aerated. Persistent atelectasis at the left lung base. The endotracheal tube could be retracted another 2 cm for optimal positioning. Orogastric tube side port overlies the stomach. IMPRESSION: Endotracheal tube tip 2 cm above the carina with aeration of the left lung. The tube could be retracted another 2 cm for optimal positioning. Electronically Signed   By: Deatra Robinson M.D.   On: 10/28/2016 21:33   Dg Chest Portable 1 View  Result Date: 10/28/2016 CLINICAL DATA:  Endotracheal and orogastric tube placement EXAM: PORTABLE CHEST 1 VIEW COMPARISON:  None. FINDINGS: There is whiteout of the left lung limiting assessment. An endotracheal tube tip appears project within the proximal right mainstem  bronchus and withdrawal approximately 4.3 cm is suggested. Right lung remains clear. The patient is slightly tilted to the left and rotated on current exam. A gastric tube is seen coiled in the expected location of the stomach. The tip is excluded. IMPRESSION: 1. The tip of an  endotracheal tube appears to be in the proximal right mainstem bronchus. Pullback of the tube approximately 4.3 cm suggested. 2. Complete whiteout of the left hemithorax. 3. Gastric tube extends into the expected location the stomach. Electronically Signed   By: Tollie Eth M.D.   On: 10/28/2016 21:32   Dg Abd Portable 1 View  Result Date: 10/28/2016 CLINICAL DATA:  Gastric tube placement. EXAM: PORTABLE ABDOMEN - 1 VIEW COMPARISON:  None. FINDINGS: The tip of a gastric tube is seen projecting in the left lower quadrant of the abdomen with side-port in the left mid abdomen. The patient may have a J-shaped stomach accounting for this appearance, an anatomic variant. No free air. Posterior lumbar fusion hardware from L2 through L5. Surgical clips are seen in the right upper quadrant. IMPRESSION: 1. The tip and side port of a gastric tube are seen in the left hemiabdomen. 2. L2 through L5 posterior lumbar fusion. Electronically Signed   By: Tollie Eth M.D.   On: 10/28/2016 21:36   Ct Head Code Stroke Wo Contrast  Result Date: 10/28/2016 CLINICAL DATA:  Code stroke.  Altered mental status EXAM: CT HEAD WITHOUT CONTRAST TECHNIQUE: Contiguous axial images were obtained from the base of the skull through the vertex without intravenous contrast. COMPARISON:  None. FINDINGS: Brain: No mass lesion or acute hemorrhage. No focal hypoattenuation of the basal ganglia or cortex to indicate infarcted tissue. No hydrocephalus or age advanced atrophy. There is periventricular hypoattenuation compatible with chronic microvascular disease. Vascular: No hyperdense vessel. No advanced atherosclerotic calcification of the arteries at the skull base. Skull: Normal visualized skull base, calvarium and extracranial soft tissues. Sinuses/Orbits: No sinus fluid levels or advanced mucosal thickening. No mastoid effusion. Normal orbits. ASPECTS [facility] Stroke Program Early CT Score) - Ganglionic level infarction (caudate, lentiform  nuclei, internal capsule, insula, M1-M3 cortex): 7 - Supraganglionic infarction (M4-M6 cortex): 3 Total score (0-10 with 10 being normal): 10 IMPRESSION: 1. No acute hemorrhage or mass lesion. 2. Findings of chronic microvascular disease. 3. ASPECTS is 10. Dr. Caryl Pina was paged at 7:58 p.m. on 10/28/2016. Electronically Signed   By: Deatra Robinson M.D.   On: 10/28/2016 19:59     STUDIES:  CT head 10/17 > negative for acute CVA. 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 MRI brain 10/17 >   CULTURES: Blood 10/17 >  CSF 10/17 >   ANTIBIOTICS: Vanc 10/17 >  Ceftriaxone 10/17 >  Ampicillin 10/17 >  Acyclovir 10/17 >   SIGNIFICANT EVENTS: 10/17 > admit.  LINES/TUBES: ETT 10/18 >  DISCUSSION: 76 y.o. female admitted 10/17 after syncopal episode at church and stroke like symptoms.  Intubated in ED.  Initial head CT negative, MRI pending.  Neuro asked EDP for LP and empiric meningitis coverage.  ASSESSMENT / PLAN:  PULMONARY A: Respiratory insufficiency - s/p intubation in ED. P:   Full vent support. Wean as able. VAP prevention measures. SBT in AM if neuro status allows. CXR in AM.  CARDIOVASCULAR A:  Hx HTN. P:  Monitor hemodynamics. Cleviprex, goal SBP < 160. Hold preadmission hyzaar.  RENAL A:   Hypokalemia. Hypocalcemia. P:  K per tube. 1g Ca gluconate. NS @ 75. BMP in AM.  GASTROINTESTINAL A:   GI prophylaxis. Nutrition. P:   SUP: Pantoprazole. NPO.  HEMATOLOGIC A:   VTE Prophylaxis. P:  SCD's / heparin. CBC in AM.  INFECTIOUS A:   ? Meningitis - low suspicion given history. P:   Abx as above (vanc / ceftriaxone / ampicillin / acyclovir).  Follow cultures as above. PCT algorithm to limit abx exposure. Would favor early d/c abx if PCT normal / cultures remain neg given low suspicion for meningitis.  ENDOCRINE A:   Hyperglycemia  - no hx DM.   P:   SSI. Assess Hgb A1c.  NEUROLOGIC A:   Acute encephalopathy - concern for CVA.  Initial head CT negative, MRI pending.  Neuro considering meningitis, LP pending.  Low suspicion given no history to support. P:   Sedation:  Propofol gtt / Fentanyl PRN. RASS goal: 0 to -1. Daily WUA. F/u brain MRI. Neuro following. Hold preadmission valium, sertraline, norco.  Family updated: Son and daughter in law updated at bedside.  Interdisciplinary Family Meeting v Palliative Care Meeting:  Due by: 11/03/16.  CC time: 35 min.   Rutherford Guys, [place] - C  Pulmonary & Critical Care Medicine Pager: [phone]  or [phone] 10/28/2016, 10:16 PM  STAFF NOTE  I, Dr Newell Coral have personally reviewed patient's available data, including medical history, events of note, physical examination and test results as part of my evaluation. I have discussed with PA and other care providers such as pharmacist, RN and RRT.  In addition,  I personally evaluated patient  76 yr old female with a PMHx of HTN presented on 10/17 s/p syncopal event at church. 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 Otherwise, no large vessel occlusion or high-grade stenosis. Attenuated opacification of the left PICA. Otherwise, the intracranial posterior circulation is normal. Aortic Atherosclerosis. Pt is currently intubated on Clevipdex and Propofol ggt On my exam she is arousable pupils are reactive + gag and withdraws from pain. I discussed with family sitting at bedside regarding clinical condition, prognosis and answered any questions they may have. Plan: continue on sedation with RASS goal 0 to -1 f/u Neuro recs. Insulin sliding scale for glycemic control. Repletion of K+ per tube, unble to appreciate nuchal rigidity low suspicion for meningitis. Started on empiric  abx and awaiting LP. Cont Cleviprex with goal SBP<160. Assess in AM with sedation vacation and if ok with Neuro and pt meets requirements SBT.  Rest per PA whose note is outlined above and that I agree with  The patient is critically ill with multiple organ systems failure and requires high complexity decision making for assessment and support, frequent evaluation and titration of therapies, application of advanced monitoring technologies and extensive interpretation of multiple databases.  Critical Care Time devoted to patient care services described in this note is 35 Minutes. This time reflects time of care of this signee Dr Newell Coral. This critical care time does not reflect procedure time, or teaching time or supervisory time of PA/NP/Med student/Med Resident etc but could involve care discussion time   DISPOSITION: ICU CODE STATUS: FULL FAMILY: Son at bedside, had an extensive conversation with him PROGNOSIS: Guarded   Dr. Newell Coral Pulmonary Critical Care Medicine Locums  10/28/2016 11:16 PM

## 2016-10-28 NOTE — ED Notes (Signed)
Dr.Tegeler called to bedside

## 2016-10-28 NOTE — ED Triage Notes (Addendum)
Pt to ED with EMS after a syncopal episode at church. Pt was walking out of church this afternoon, felt dizzy, sat down in a chair, then had a syncopal episode. Pt was hot and clammy on EMS arrival, then vomited. 4mg  zofran given enroute.

## 2016-10-29 ENCOUNTER — Inpatient Hospital Stay (HOSPITAL_COMMUNITY): Payer: Medicare Other

## 2016-10-29 ENCOUNTER — Other Ambulatory Visit (HOSPITAL_COMMUNITY): Payer: Medicare Other

## 2016-10-29 DIAGNOSIS — I6330 Cerebral infarction due to thrombosis of unspecified cerebral artery: Secondary | ICD-10-CM | POA: Insufficient documentation

## 2016-10-29 DIAGNOSIS — I6502 Occlusion and stenosis of left vertebral artery: Secondary | ICD-10-CM

## 2016-10-29 DIAGNOSIS — J9600 Acute respiratory failure, unspecified whether with hypoxia or hypercapnia: Secondary | ICD-10-CM

## 2016-10-29 LAB — ECHOCARDIOGRAM COMPLETE
EERAT: 11.37
FS: 38 % (ref 28–44)
HEIGHTINCHES: 62 in
IVS/LV PW RATIO, ED: 0.89
LA diam end sys: 38 mm
LA diam index: 2.35 cm/m2
LA vol index: 27.8 mL/m2
LA vol: 45.1 mL
LASIZE: 38 mm
LAVOLA4C: 46.3 mL
LV E/e' medial: 11.37
LV TDI E'LATERAL: 8.18
LVEEAVG: 11.37
LVELAT: 8.18 cm/s
LVOT SV: 81 mL
LVOT VTI: 25.9 cm
LVOT area: 3.14 cm2
LVOT diameter: 20 mm
LVOT peak grad rest: 5 mmHg
LVOT peak vel: 112 cm/s
Lateral S' vel: 16.7 cm/s
MV pk E vel: 93 m/s
MVPG: 3 mmHg
MVPKAVEL: 129 m/s
PW: 9 mm — AB (ref 0.6–1.1)
RV sys press: 29 mmHg
Reg peak vel: 253 cm/s
TAPSE: 26.1 mm
TDI e' medial: 5.51
TR max vel: 253 cm/s
WEIGHTICAEL: 2169.33 [oz_av]

## 2016-10-29 LAB — BLOOD GAS, ARTERIAL
ACID-BASE EXCESS: 4.3 mmol/L — AB (ref 0.0–2.0)
Bicarbonate: 28.7 mmol/L — ABNORMAL HIGH (ref 20.0–28.0)
DRAWN BY: 414221
FIO2: 60
LHR: 14 {breaths}/min
MECHVT: 400 mL
O2 Saturation: 99.4 %
PATIENT TEMPERATURE: 98.6
PCO2 ART: 45.9 mmHg (ref 32.0–48.0)
PEEP/CPAP: 5 cmH2O
PH ART: 7.412 (ref 7.350–7.450)
pO2, Arterial: 220 mmHg — ABNORMAL HIGH (ref 83.0–108.0)

## 2016-10-29 LAB — GLUCOSE, CAPILLARY
GLUCOSE-CAPILLARY: 114 mg/dL — AB (ref 65–99)
GLUCOSE-CAPILLARY: 100 mg/dL — AB (ref 65–99)
Glucose-Capillary: 108 mg/dL — ABNORMAL HIGH (ref 65–99)
Glucose-Capillary: 114 mg/dL — ABNORMAL HIGH (ref 65–99)
Glucose-Capillary: 115 mg/dL — ABNORMAL HIGH (ref 65–99)
Glucose-Capillary: 122 mg/dL — ABNORMAL HIGH (ref 65–99)

## 2016-10-29 LAB — CBC
HEMATOCRIT: 38.1 % (ref 36.0–46.0)
Hemoglobin: 12.9 g/dL (ref 12.0–15.0)
MCH: 30.1 pg (ref 26.0–34.0)
MCHC: 33.9 g/dL (ref 30.0–36.0)
MCV: 89 fL (ref 78.0–100.0)
Platelets: 297 10*3/uL (ref 150–400)
RBC: 4.28 MIL/uL (ref 3.87–5.11)
RDW: 15 % (ref 11.5–15.5)
WBC: 15.6 10*3/uL — AB (ref 4.0–10.5)

## 2016-10-29 LAB — PROCALCITONIN: PROCALCITONIN: 0.14 ng/mL

## 2016-10-29 LAB — BASIC METABOLIC PANEL
ANION GAP: 12 (ref 5–15)
BUN: 23 mg/dL — ABNORMAL HIGH (ref 6–20)
CALCIUM: 9.2 mg/dL (ref 8.9–10.3)
CHLORIDE: 97 mmol/L — AB (ref 101–111)
CO2: 25 mmol/L (ref 22–32)
Creatinine, Ser: 0.86 mg/dL (ref 0.44–1.00)
GFR calc Af Amer: >60 mL/min (ref >60)
GFR calc non Af Amer: >60 mL/min (ref >60)
GLUCOSE: 164 mg/dL — AB (ref 65–99)
Potassium: 3.3 mmol/L — ABNORMAL LOW (ref 3.5–5.1)
Sodium: 134 mmol/L — ABNORMAL LOW (ref 135–145)

## 2016-10-29 LAB — LIPID PANEL
Cholesterol: 206 mg/dL — ABNORMAL HIGH (ref 0–200)
HDL: 49 mg/dL (ref >40)
LDL CALC: 134 mg/dL — AB (ref 0–99)
TRIGLYCERIDES: 117 mg/dL (ref <150)
Total CHOL/HDL Ratio: 4.2 RATIO
VLDL: 23 mg/dL (ref 0–40)

## 2016-10-29 LAB — TRIGLYCERIDES: Triglycerides: 267 mg/dL — ABNORMAL HIGH (ref <150)

## 2016-10-29 LAB — MRSA PCR SCREENING: MRSA BY PCR: NEGATIVE

## 2016-10-29 LAB — HEMOGLOBIN A1C
Hgb A1c MFr Bld: 5.8 % — ABNORMAL HIGH (ref 4.8–5.6)
Mean Plasma Glucose: 119.76 mg/dL

## 2016-10-29 LAB — PHOSPHORUS: Phosphorus: 4.4 mg/dL (ref 2.5–4.6)

## 2016-10-29 LAB — MAGNESIUM: MAGNESIUM: 1.9 mg/dL (ref 1.7–2.4)

## 2016-10-29 MED ORDER — ORAL CARE MOUTH RINSE
15.0000 mL | Freq: Four times a day (QID) | OROMUCOSAL | Status: DC
  Administered 2016-10-29: 15 mL via OROMUCOSAL

## 2016-10-29 MED ORDER — VANCOMYCIN HCL 10 G IV SOLR
1250.0000 mg | Freq: Once | INTRAVENOUS | Status: DC
  Filled 2016-10-29: qty 1250

## 2016-10-29 MED ORDER — ASPIRIN 300 MG RE SUPP
300.0000 mg | Freq: Every day | RECTAL | Status: DC
  Administered 2016-10-29 – 2016-11-03 (×6): 300 mg via RECTAL
  Filled 2016-10-29 (×7): qty 1

## 2016-10-29 MED ORDER — ASPIRIN 600 MG RE SUPP
600.0000 mg | Freq: Once | RECTAL | Status: AC
  Administered 2016-10-29: 600 mg via RECTAL
  Filled 2016-10-29: qty 1

## 2016-10-29 MED ORDER — VANCOMYCIN HCL 500 MG IV SOLR: 500.0000 mg | Freq: Two times a day (BID) | INTRAVENOUS | Status: DC

## 2016-10-29 MED ORDER — CLOPIDOGREL BISULFATE 75 MG PO TABS
75.0000 mg | ORAL_TABLET | Freq: Every day | ORAL | Status: DC
  Administered 2016-11-01 – 2016-11-03 (×3): 75 mg via ORAL
  Filled 2016-10-29 (×5): qty 1

## 2016-10-29 MED ORDER — CHLORHEXIDINE GLUCONATE 0.12% ORAL RINSE (MEDLINE KIT)
15.0000 mL | Freq: Two times a day (BID) | OROMUCOSAL | Status: DC
  Administered 2016-10-29: 15 mL via OROMUCOSAL

## 2016-10-29 MED ORDER — ASPIRIN EC 81 MG PO TBEC: 81.0000 mg | DELAYED_RELEASE_TABLET | Freq: Every day | ORAL | Status: DC

## 2016-10-29 MED ORDER — DEXTROSE 5 % IV SOLN: 2.0000 g | Freq: Two times a day (BID) | INTRAVENOUS | Status: DC

## 2016-10-29 MED ORDER — ONDANSETRON HCL 4 MG/2ML IJ SOLN
4.0000 mg | Freq: Once | INTRAMUSCULAR | Status: AC
  Administered 2016-10-29: 4 mg via INTRAVENOUS
  Filled 2016-10-29: qty 2

## 2016-10-29 MED ORDER — LABETALOL HCL 5 MG/ML IV SOLN: 10.0000 mg | INTRAVENOUS | Status: DC | PRN

## 2016-10-29 MED ORDER — DEXTROSE 5 % IV SOLN
10.0000 mg/kg | Freq: Three times a day (TID) | INTRAVENOUS | Status: DC
  Filled 2016-10-29 (×2): qty 13.4

## 2016-10-29 MED ORDER — AMPICILLIN SODIUM 2 G IJ SOLR
2.0000 g | INTRAMUSCULAR | Status: DC
  Filled 2016-10-29 (×3): qty 2000

## 2016-10-29 MED ORDER — ASPIRIN EC 81 MG PO TBEC
81.0000 mg | DELAYED_RELEASE_TABLET | Freq: Every day | ORAL | Status: DC
  Filled 2016-10-29: qty 1

## 2016-10-29 MED ORDER — ONDANSETRON HCL 4 MG/2ML IJ SOLN: 4.0000 mg | Freq: Four times a day (QID) | INTRAMUSCULAR | Status: DC | PRN

## 2016-10-29 MED ORDER — WHITE PETROLATUM EX OINT
TOPICAL_OINTMENT | CUTANEOUS | Status: DC | PRN
  Administered 2016-10-29: 1 via TOPICAL
  Filled 2016-10-29: qty 28.35

## 2016-10-29 MED ORDER — ASPIRIN 300 MG RE SUPP
300.0000 mg | Freq: Every day | RECTAL | Status: DC
  Filled 2016-10-29: qty 1

## 2016-10-29 MED ORDER — ORAL CARE MOUTH RINSE
15.0000 mL | Freq: Two times a day (BID) | OROMUCOSAL | Status: DC
  Administered 2016-10-29 – 2016-11-02 (×10): 15 mL via OROMUCOSAL

## 2016-10-29 NOTE — Evaluation (Signed)
Clinical/Bedside Swallow Evaluation Patient Details  Name: [name] MRN: [id number] Date of Birth: 03/31/1940  Today's Date: 10/29/2016 Time: SLP Start Time (ACUTE ONLY): 1616 SLP Stop Time (ACUTE ONLY): 1633 SLP Time Calculation (min) (ACUTE ONLY): 17 min  Past Medical History: No past medical history on file. Past Surgical History:  Past Surgical History:  Procedure Laterality Date  . BACK SURGERY     HPI:  Pt is a 76 y.o.femalewith PMH of HTN admitted 10/17 after syncopal episode at church. 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 She was intubated 10/17-10/18.   Assessment / Plan / Recommendation Clinical Impression  At baseline pt is coughing and expectorating her secretions. She said that she has been having trouble "getting them down" since extubation. SLP provided cueing for more effortful cough and use of oral suction. Pt attempted small, single ice chips with minimal hyolaryngeal movement noted upon palpation and strong coughing with expectoration. Pt was only briefly intubated, although she and her son both endorse vocal quality changes, which may indicate some level of acute, reversible dysphagia s/p extubation; however, given the location of her infarcts, I am also concerned about a neurologically-based dysphagia. Recommend that she remain NPO today with emphasis on frequent oral care and secretion management. Will f/u on next date to assess for PO readiness versus need for instrumental testing.  SLP Visit Diagnosis: Dysphagia, unspecified (R13.10)    Aspiration Risk  Severe aspiration risk    Diet Recommendation NPO   Medication Administration: Via alternative means    Other  Recommendations Oral Care Recommendations: Oral care QID Other Recommendations: Have oral suction available   Follow up Recommendations  (tba)      Frequency and Duration min 3x week   2 weeks       Prognosis Prognosis for Safe Diet Advancement: Good      Swallow Study   General HPI: Pt is a 76 y.o.femalewith PMH of HTN admitted 10/17 after syncopal episode at church. 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 She was intubated 10/17-10/18. Type of Study: Bedside Swallow Evaluation Previous Swallow Assessment: none in chart Diet Prior to this Study: NPO Temperature Spikes Noted: No Respiratory Status: Nasal cannula History of Recent Intubation: Yes Length of Intubations (days): 2 days Date extubated: 10/29/16 Behavior/Cognition: Alert;Cooperative;Pleasant mood Oral Cavity Assessment: Within Functional Limits Oral Care Completed by SLP: Recent completion by staff Oral Cavity - Dentition: Adequate natural dentition Vision: Functional for self-feeding Self-Feeding Abilities: Able to feed self Patient Positioning: Upright in bed Baseline Vocal Quality: Other (comment) (deep, raspy per pt/son) Volitional Cough: Strong Volitional Swallow: Unable to elicit    Oral/Motor/Sensory Function Overall Oral Motor/Sensory Function: Within functional limits   Ice Chips Ice chips: Impaired Presentation: Self Fed;Spoon Pharyngeal Phase Impairments: Decreased hyoid-laryngeal movement;Cough - Immediate;Other (comments) ("can't get it down")   Thin Liquid Thin Liquid: Not tested    Nectar Thick Nectar Thick Liquid: Not tested   Honey Thick Honey Thick Liquid: Not tested   Puree Puree: Not tested   Solid   GO   Solid: Not tested        Maxcine Hamaiewonsky, Katharina Jehle 10/29/2016,4:48 PM  Maxcine HamLaura Paiewonsky, M.A. CCC-SLP [phone]

## 2016-10-29 NOTE — Progress Notes (Signed)
SLP Cancellation Note  Patient Details Name: [name] MRN: [id number] DOB: 05/02/1940   Cancelled treatment:       Reason Eval/Treat Not Completed: Other (comment) Pt currently with MD. Other providers waiting to see her. Will f/u as able for swallow eval.   Maxcine Hamaiewonsky, Mccayla Shimada 10/29/2016, 3:30 PM  Maxcine HamLaura Paiewonsky, M.A. CCC-SLP [phone]

## 2016-10-29 NOTE — Progress Notes (Signed)
PULMONARY / CRITICAL CARE MEDICINE   Name: [name] MRN: [id number] DOB: 1940/08/25    ADMISSION DATE:  10/28/2016 CONSULTATION DATE:  10/28/16  REFERRING MD:  Tegeler  CHIEF COMPLAINT:  AMS  HISTORY OF PRESENT ILLNESS:   [name] is a 76 y.o. female with PMH of HTN.  She was brought to [facility] ED 10/17 after she had a syncopal episode after leaving church.  She apparently was in her usual state of health prior to and during church.  When she walked out, she felt dizzy so tried to sit in a chair.  She then had a vomiting episode followed by syncope and was subsequently sent to the ED for further evaluation. In ED, code stroke was activated.  She was taken to CT where she became restless with gargled speech and inability to follow commands.  She was subsequently intubated. CT of the head was negative for acute CVA.  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 PCCM was asked to assist with vent management.    SUBJECTIVE:  No acute events overnight  VITAL SIGNS: BP 130/68 (BP Location: Right Arm)   Pulse 80   Temp (!) 97.4 F (36.3 C) (Axillary)   Resp 14   Ht 5\' 2"  (1.575 m)   Wt 61.5 kg (135 lb 9.3 oz)   SpO2 100%   BMI 24.80 kg/m   HEMODYNAMICS:    VENTILATOR SETTINGS: Vent Mode: PRVC FiO2 (%):  [60 %-100 %] 60 % Set Rate:  [14 bmp] 14 bmp Vt Set:  [400 mL] 400 mL PEEP:  [5 cmH20] 5 cmH20 Plateau Pressure:  [12 cmH20-16 cmH20] 12 cmH20  INTAKE / OUTPUT: I/O last 3 completed shifts: In: 577 [I.V.:467; IV Piggyback:110] Out: 1290 [Urine:1290]   PHYSICAL EXAMINATION:   General: Elderly female of normal body habitus on vent in NAD Neuro: Sedated, does wake up and follow commands.  HEENT: [place]/AT. PERRL, no JVD Cardiovascular: RRR, no M/R/G.  Lungs: Clear bilateral breath sounds Abdomen: BS x 4, soft, NT/ND.  Musculoskeletal: No gross deformities, no edema.   Skin: Intact, warm, no rashes.  LABS:  BMET  Recent Labs Lab 10/28/16 1930 10/28/16 2003 10/29/16 0110  NA 135 141 134*  K 2.6* 2.6* 3.3*  CL 101 106 97*  CO2 18*  --  25  BUN 24* 23* 23*  CREATININE 0.94 0.80 0.86  GLUCOSE 166* 164* 164*    Electrolytes  Recent Labs Lab 10/28/16 1930 10/29/16 0110  CALCIUM 9.6 9.2  MG  --  1.9  PHOS  --  4.4    CBC  Recent Labs Lab 10/28/16 1930 10/28/16 2003 10/29/16 0110  WBC 12.3*  --  15.6*  HGB 12.7 11.2* 12.9  HCT 36.7 33.0* 38.1  PLT 344  --  297    Coag's  Recent Labs Lab 10/28/16 1930  APTT 25  INR 0.97    Sepsis Markers  Recent Labs Lab 10/29/16 0110  PROCALCITON 0.14    ABG  Recent Labs Lab 10/28/16 2135 10/29/16 0530  PHART 7.370 7.412  PCO2ART 48.4* 45.9  PO2ART 204.0* 220*    Liver Enzymes  Recent Labs Lab 10/28/16 1930  AST 32  ALT 21  ALKPHOS 116  BILITOT 0.8  ALBUMIN 4.4    Cardiac Enzymes No results for input(s): TROPONINI, PROBNP in the last 168 hours.  Glucose  Recent Labs Lab 10/28/16 2036 10/29/16 0245 10/29/16 0800  GLUCAP 187* 114* 100*    Imaging Ct Angio Head W Or Wo Contrast  Result Date: 10/28/2016 CLINICAL DATA:  Altered mental status.  Possible nystagmus. EXAM: CT ANGIOGRAPHY HEAD AND NECK TECHNIQUE: Multidetector CT imaging of the head and neck was performed using the standard protocol during bolus administration of intravenous contrast. Multiplanar CT image reconstructions and MIPs were obtained to evaluate the vascular anatomy. Carotid stenosis measurements (when applicable) are obtained utilizing NASCET criteria, using the distal internal carotid diameter as the denominator. CONTRAST:  50 mL Isovue 370 COMPARISON:  Head CT earlier the same day FINDINGS: CTA NECK FINDINGS Aortic arch: There is mild calcific atherosclerosis of the aortic arch. There is no aneurysm, dissection or hemodynamically significant stenosis of the visualized ascending aorta  and aortic arch. Conventional 3 vessel aortic branching pattern. The visualized proximal subclavian arteries are widely patent. Right carotid system: The right common carotid origin is widely patent. There is no common carotid or internal carotid artery dissection or aneurysm. No hemodynamically significant stenosis. Left carotid system: The left common carotid origin is widely patent. There is no common carotid or internal carotid artery dissection or aneurysm. No hemodynamically significant stenosis. Vertebral arteries: The vertebral system is right dominant. Both vertebral artery origins are patent. 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 There is no distal opacification. The entire right vertebral artery is normal. Skeleton: There is no bony spinal canal stenosis. No lytic or blastic lesions. Other neck: The nasopharynx is clear. The oropharynx and hypopharynx are normal. The epiglottis is normal. The supraglottic larynx, glottis and subglottic larynx are normal. No retropharyngeal collection. The parapharyngeal spaces are preserved. The parotid and submandibular glands are normal. No sialolithiasis or salivary ductal dilatation. The thyroid gland is normal. There is no cervical lymphadenopathy. Upper chest: Biapical septal thickening, possibly mild pulmonary edema. Review of the MIP images confirms the above findings CTA HEAD FINDINGS Anterior circulation: --Intracranial internal carotid arteries: Mild atherosclerotic calcification of the internal carotid arteries at the skullbase without significant stenosis. --Anterior cerebral arteries: Normal. --Middle cerebral arteries: Normal. --Posterior communicating arteries: Present on the left, absent on the right. Posterior circulation: --Posterior cerebral arteries: Normal. --Superior cerebellar arteries: Normal. --Basilar artery: Normal. --Anterior inferior cerebellar arteries: Normal. --Posterior inferior cerebellar arteries: Normal right  PICA. Slight opacification of left PICA. Venous sinuses: As permitted by contrast timing, patent. Anatomic variants: None Delayed phase: Not performed. Review of the MIP images confirms the above findings IMPRESSION: 1. 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 Otherwise, no large vessel occlusion or high-grade stenosis. 2. Attenuated opacification of the left PICA. Otherwise, the intracranial posterior circulation is normal. 3.  Aortic Atherosclerosis (ICD10-I70.0). These results were called by telephone at the time of interpretation on 10/28/2016 at 8:36 pm to Dr. Caryl Pina , who verbally acknowledged these results. Electronically Signed   By: Deatra Robinson M.D.   On: 10/28/2016 20:44   Ct Angio Neck W And/or Wo Contrast  Result Date: 10/28/2016 CLINICAL DATA:  Altered mental status.  Possible nystagmus. EXAM: CT ANGIOGRAPHY HEAD AND NECK TECHNIQUE: Multidetector CT imaging of the head and neck was performed using the standard protocol during bolus administration of intravenous contrast. Multiplanar CT image reconstructions and MIPs were obtained to evaluate the vascular anatomy. Carotid stenosis measurements (when applicable) are obtained utilizing NASCET criteria, using the distal internal carotid diameter as the denominator. CONTRAST:  50 mL Isovue 370 COMPARISON:  Head CT earlier the same day FINDINGS:  CTA NECK FINDINGS Aortic arch: There is mild calcific atherosclerosis of the aortic arch. There is no aneurysm, dissection or hemodynamically significant stenosis of the visualized ascending aorta and aortic arch. Conventional 3 vessel aortic branching pattern. The visualized proximal subclavian arteries are widely patent. Right carotid system: The right common carotid origin is widely patent. There is no common carotid or internal carotid artery dissection or aneurysm. No hemodynamically significant stenosis. Left carotid system: The left common carotid origin is  widely patent. There is no common carotid or internal carotid artery dissection or aneurysm. No hemodynamically significant stenosis. Vertebral arteries: The vertebral system is right dominant. Both vertebral artery origins are patent. 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 There is no distal opacification. The entire right vertebral artery is normal. Skeleton: There is no bony spinal canal stenosis. No lytic or blastic lesions. Other neck: The nasopharynx is clear. The oropharynx and hypopharynx are normal. The epiglottis is normal. The supraglottic larynx, glottis and subglottic larynx are normal. No retropharyngeal collection. The parapharyngeal spaces are preserved. The parotid and submandibular glands are normal. No sialolithiasis or salivary ductal dilatation. The thyroid gland is normal. There is no cervical lymphadenopathy. Upper chest: Biapical septal thickening, possibly mild pulmonary edema. Review of the MIP images confirms the above findings CTA HEAD FINDINGS Anterior circulation: --Intracranial internal carotid arteries: Mild atherosclerotic calcification of the internal carotid arteries at the skullbase without significant stenosis. --Anterior cerebral arteries: Normal. --Middle cerebral arteries: Normal. --Posterior communicating arteries: Present on the left, absent on the right. Posterior circulation: --Posterior cerebral arteries: Normal. --Superior cerebellar arteries: Normal. --Basilar artery: Normal. --Anterior inferior cerebellar arteries: Normal. --Posterior inferior cerebellar arteries: Normal right PICA. Slight opacification of left PICA. Venous sinuses: As permitted by contrast timing, patent. Anatomic variants: None Delayed phase: Not performed. Review of the MIP images confirms the above findings IMPRESSION: 1. 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 Otherwise, no large vessel occlusion or high-grade stenosis. 2. Attenuated  opacification of the left PICA. Otherwise, the intracranial posterior circulation is normal. 3.  Aortic Atherosclerosis (ICD10-I70.0). These results were called by telephone at the time of interpretation on 10/28/2016 at 8:36 pm to Dr. Caryl Pina , who verbally acknowledged these results. Electronically Signed   By: Deatra Robinson M.D.   On: 10/28/2016 20:44   Mr Brain Wo Contrast  Result Date: 10/29/2016 CLINICAL DATA:  76 y/o  F; stroke. EXAM: MRI HEAD WITHOUT CONTRAST TECHNIQUE: Multiplanar, multiecho pulse sequences of the brain and surrounding structures were obtained without intravenous contrast. COMPARISON:  10/28/2016 CT angiogram of head and neck. FINDINGS: Brain: Reduced diffusion within the left aspect of medulla and throughout the left greater than right mid to inferior cerebellar hemispheres and vermis compatible with acute/ early subacute infarction. No abnormal susceptibility hypointensity to indicate hemorrhage. No significant mass effect. No hydrocephalus, extra-axial collection, effacement of basilar cisterns, or focal mass effect of the brain. Mild last T2 FLAIR hyperintense signal abnormality in periventricular white matter likely represents chronic microvascular ischemic changes. Mild brain parenchymal volume loss. Vascular: Abnormal flow void within the left intracranial vertebral artery corresponding to occlusion on prior CT angiogram. Skull and upper cervical spine: Normal marrow signal. Sinuses/Orbits: Negative. Other: Fluid within the nasopharynx. IMPRESSION: 1. Several small areas of acute/early subacute infarction involving left medulla and scattered throughout the left-greater-than-right mid to inferior cerebellar hemispheres as well as vermis. No acute hemorrhage or significant mass effect. 2. 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 3. Mild chronic microvascular ischemic changes and mild parenchymal volume loss of the brain. These  results were called by telephone at the time of interpretation on 10/29/2016 at 1:01 am to Dr. Caryl Pina , who verbally acknowledged these results. Electronically Signed   By: Mitzi Hansen M.D.   On: 10/29/2016 01:09   Dg Chest Port 1 View  Result Date: 10/29/2016 CLINICAL DATA:  Respiratory failure short of breath EXAM: PORTABLE CHEST 1 VIEW COMPARISON:  10/28/2016 FINDINGS: Endotracheal tube remains in good position. Gastric tube in the stomach. Left lower lobe airspace disease is improved. Minimal airspace disease in the right lower lobe. Negative for heart failure and edema or effusion IMPRESSION: Endotracheal tube remains in good position. Improved aeration left lung base. Electronically Signed   By: Marlan Palau M.D.   On: 10/29/2016 06:57   Dg Chest Portable 1 View  Result Date: 10/28/2016 CLINICAL DATA:  Endotracheal tube adjustment EXAM: PORTABLE CHEST 1 VIEW COMPARISON:  Chest radiograph 10/28/2016 at 9:01 p.m. FINDINGS: Endotracheal tube has been retracted and now ends 2 cm above the carina. The left lung is now aerated. Persistent atelectasis at the left lung base. The endotracheal tube could be retracted another 2 cm for optimal positioning. Orogastric tube side port overlies the stomach. IMPRESSION: Endotracheal tube tip 2 cm above the carina with aeration of the left lung. The tube could be retracted another 2 cm for optimal positioning. Electronically Signed   By: Deatra Robinson M.D.   On: 10/28/2016 21:33   Dg Chest Portable 1 View  Result Date: 10/28/2016 CLINICAL DATA:  Endotracheal and orogastric tube placement EXAM: PORTABLE CHEST 1 VIEW COMPARISON:  None. FINDINGS: There is whiteout of the left lung limiting assessment. An endotracheal tube tip appears project within the proximal right mainstem bronchus and withdrawal approximately 4.3 cm is suggested. Right lung remains clear. The patient is slightly tilted to the left and rotated on current exam. A gastric tube  is seen coiled in the expected location of the stomach. The tip is excluded. IMPRESSION: 1. The tip of an endotracheal tube appears to be in the proximal right mainstem bronchus. Pullback of the tube approximately 4.3 cm suggested. 2. Complete whiteout of the left hemithorax. 3. Gastric tube extends into the expected location the stomach. Electronically Signed   By: Tollie Eth M.D.   On: 10/28/2016 21:32   Dg Abd Portable 1 View  Result Date: 10/28/2016 CLINICAL DATA:  Gastric tube placement. EXAM: PORTABLE ABDOMEN - 1 VIEW COMPARISON:  None. FINDINGS: The tip of a gastric tube is seen projecting in the left lower quadrant of the abdomen with side-port in the left mid abdomen. The patient may have a J-shaped stomach accounting for this appearance, an anatomic variant. No free air. Posterior lumbar fusion hardware from L2 through L5. Surgical clips are seen in the right upper quadrant. IMPRESSION: 1. The tip and side port of a gastric tube are seen in the left hemiabdomen. 2. L2 through L5 posterior lumbar fusion. Electronically Signed   By: Tollie Eth M.D.   On: 10/28/2016 21:36   Ct Head Code Stroke Wo Contrast  Result Date: 10/28/2016 CLINICAL DATA:  Code stroke.  Altered mental status EXAM: CT HEAD WITHOUT CONTRAST TECHNIQUE: Contiguous axial images were obtained from the base of the skull through the vertex without intravenous contrast. COMPARISON:  None. FINDINGS: Brain: No mass lesion or acute hemorrhage. No focal hypoattenuation of the basal ganglia or cortex to indicate  infarcted tissue. No hydrocephalus or age advanced atrophy. There is periventricular hypoattenuation compatible with chronic microvascular disease. Vascular: No hyperdense vessel. No advanced atherosclerotic calcification of the arteries at the skull base. Skull: Normal visualized skull base, calvarium and extracranial soft tissues. Sinuses/Orbits: No sinus fluid levels or advanced mucosal thickening. No mastoid effusion. Normal  orbits. ASPECTS [facility]([place] Stroke Program Early CT Score) - Ganglionic level infarction (caudate, lentiform nuclei, internal capsule, insula, M1-M3 cortex): 7 - Supraganglionic infarction (M4-M6 cortex): 3 Total score (0-10 with 10 being normal): 10 IMPRESSION: 1. No acute hemorrhage or mass lesion. 2. Findings of chronic microvascular disease. 3. ASPECTS is 10. Dr. Caryl PinaEric Lindzen was paged at 7:58 p.m. on 10/28/2016. Electronically Signed   By: Deatra RobinsonKevin  Herman M.D.   On: 10/28/2016 19:59     STUDIES:  CT head 10/17 > negative for acute CVA. 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   CULTURES: Blood 10/17 >  CSF 10/17 >   ANTIBIOTICS: Vanc 10/17 > 10/18 Ceftriaxone 10/17 > 10/18 Ampicillin 10/17 > 10/18 Acyclovir 10/17 > 10/18  SIGNIFICANT EVENTS: 10/17 > admit.  LINES/TUBES: ETT 10/18 >  DISCUSSION: 76 y.o. female admitted 10/17 after syncopal episode at church and stroke like symptoms.  Intubated in ED.  Initial head CT negative, MRI demonstrated cerebellar strokes and medullary infarct.   ASSESSMENT / PLAN:  PULMONARY A: Respiratory insufficiency - s/p intubation in ED. P:   Full vent support. Will wean this AM with hopes for extubation.  VAP prevention measures.  CARDIOVASCULAR A:  Hx HTN. P:  Monitor hemodynamics. Cleviprex, goal SBP < 160. Hold preadmission hyzaar.  RENAL A:   Hypokalemia. Hypocalcemia. P:   K replaced by ELINK NS @ 75. BMP in AM.  GASTROINTESTINAL A:   GI prophylaxis. Nutrition. P:   SUP: Pantoprazole. NPO.- SLP eval once extubated  HEMATOLOGIC A:   VTE Prophylaxis. P:  SCD's / heparin. CBC in AM.  INFECTIOUS A:   ? Meningitis - low suspicion given history - less likely given new findings of CVA P:   Follow cultures PCT algorithm to limit abx  exposure. ABX/antivirals DC'd  ENDOCRINE A:   Hyperglycemia - no hx DM.   P:   SSI.  NEUROLOGIC A:   Acute encephalopathy - concern for CVA.  Initial head CT negative, MRI pending.  Neuro considering meningitis, LP pending.  Low suspicion given no history to support. P:   Sedation:  Propofol gtt / Fentanyl PRN. RASS goal: 0 to -1. Neuro following. Hold preadmission valium, sertraline, norco.  Family updated: Family updated bedside in ICU  Interdisciplinary Family Meeting v Palliative Care Meeting:  Due by: 11/03/16.  Joneen RoachPaul Dearion Huot, AGACNP-BC [facility] Pager [phone] or [phone]  10/29/2016 9:22 AM

## 2016-10-29 NOTE — Progress Notes (Signed)
Discussed MRI with Radiology in conjunction with CTA findings. MRI reveals that the indeterminate age left vertebral artery occlusion is most likely acute given increased luminal signal on DWI and T2 weighted images. Multifocal acute cerebellar infarctions, left greater than right are noted. There is an acute left lateral medullary infarction.   Official Radiology report:  1. Several small areas of acute/early subacute infarction involving left medulla and scattered throughout the left-greater-than-right mid to inferior cerebellar hemispheres as well as vermis. No acute hemorrhage or significant mass effect. 2. 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 3. Mild chronic microvascular ischemic changes and mild parenchymal volume loss of the brain.  Assessment/Recommendations: 1. I have discussed with family the MRI findings of acute cerebellar strokes and left lateral medullary infarction and have reviewed the images with them as well. Given the completed infarctions, I have expressed my medical opinion that endovascular treatment risks would outweigh potential benefits. Risks include vertebral artery perforation with hemorrhagic complications, luxury perfusion with hemorrhagic conversion of the acute infarcts, and distal embolization from perturbation of the clot with possible additional strokes. Discussed that there is risk of further infarction without endovascular treatment, but that this is felt to be exceeded by the risks of the procedure described above. Family expressed understanding and agreement with the plan.  2. IV Heparin to decrease risk of stump embolization has also been considered, but risks of hemorrhagic conversion of the acute strokes with potential for life threatening mass effect are felt to outweigh benefits of emergent anticoagulaton. Continue with ASA 300 mg rectally qd until able to take PO.  3. Switch BP management to permissive HTN.  Would treat if SBP > 180 given patient age/fragility and increased risk of hemorrhagic conversion of posterior circulation infarcts if SBP exceeds 180.  4. Wean off ventilator and attempt extubation in the morning. 5. Repeat CT head in 12 hours 6. Discussed with Dr. Rush Landmarkegeler and CCM.   Electronically signed: Dr. Caryl PinaEric Kawena Lyday

## 2016-10-29 NOTE — ED Notes (Signed)
Per Dr.Tegeler, LP to be completed then to MRI.

## 2016-10-29 NOTE — ED Notes (Signed)
Oxygen sats 70s% in CT; Nasal Cannula applied; pt drooling, mouth suctioned

## 2016-10-29 NOTE — Progress Notes (Signed)
RT found patient on PS/CPAP.

## 2016-10-29 NOTE — ED Notes (Signed)
Patient transported to MRI 

## 2016-10-29 NOTE — Procedures (Signed)
Extubation Procedure Note  Patient Details:   Name: [name] DOB: 06/04/1940 MRN: [id number]   Airway Documentation:  Airway 7.5 mm (Active)  Secured at (cm) 20 cm 10/29/2016  9:35 AM  Measured From Lips 10/29/2016  9:35 AM  Secured Location Left 10/29/2016  9:35 AM  Secured By [employer] Tube Holder 10/29/2016  9:35 AM  Tube Holder Repositioned Yes 10/29/2016  9:35 AM  Cuff Pressure (cm H2O) 28 cm H2O 10/29/2016  9:35 AM  Site Condition Dry 10/29/2016  9:35 AM    Evaluation  O2 sats: stable throughout Complications: No apparent complications Patient did tolerate procedure well. Bilateral Breath Sounds: Clear, Diminished   Yes   Patient extubated per order to 4L [place] with no complications. Cuff leak was noted prior to extubation. Patient is able to speak and is alert and oriented. Vitals are stable and sats are 100%. RT will continue to monitor.   [name] 10/29/2016, 10:40 AM

## 2016-10-29 NOTE — Progress Notes (Signed)
MRI brain completed. Images reviewed prior to official Radiology report show multifocal bilateral cerebellar strokes and a left lateral medullary ischemic infarction.  A/R 1. Load with 600 mg rectal ASA (ordered) 2. TTE to assess for possible mural thrombosis 3. Cardiac telemetry 4. Will discuss images with Radiology 5. Cancel LP and hold antibiotics 6. NPO with formal swallow evaluation after extubation  Electronically signed: Dr. Caryl PinaEric Wrenly Lauritsen

## 2016-10-29 NOTE — Progress Notes (Signed)
  Echocardiogram 2D Echocardiogram has been performed.  Delcie RochENNINGTON, Johnnye Sandford 10/29/2016, 4:36 PM

## 2016-10-29 NOTE — Progress Notes (Signed)
eLink Physician-Brief Progress Note Patient Name: [name] Bia DOB: 04/10/1940 MRN: [id number]   Date of Service  10/29/2016  HPI/Events of Note  D/w neuro; cerebellar stroke.   eICU Interventions  DC abx, anti-htn meds for SBP>180, rectal asa.         Shane Crutchradeep Saw Mendenhall 10/29/2016, 3:57 AM

## 2016-10-29 NOTE — ED Notes (Signed)
Dr.Lindzen at bedside updating family

## 2016-10-29 NOTE — Progress Notes (Signed)
STROKE TEAM PROGRESS NOTE   HISTORY OF PRESENT ILLNESS (per record) [name] is an 76 y.o. female presenting to the [facility] ED via EMS after a short syncopal spell at church, which was followed by gradually worsening confusion and agitation. She had been walking out of church, when the symptoms began. Prior to her syncopal episode, she stated that she felt dizzy. She was noted to be hot and clammy, then vomited. Zofran 4 mg was administered en route.    In the ED a Code Stroke was called for acute confusion. No facial droop or limb weakness was noted.   Patient was not administered IV t-PA secondary to initial presentation was more concerning for encephalopathy than acute infarct.   SUBJECTIVE (INTERVAL HISTORY) Her daughter, son, and granddaughter are at the bedside. She is awake and interactive seen while breathing spontaneously on ventilator. She has continued vomiting. Initial antibiotics were discontinued. Family was updated on the nature of her infarction and possible residual deficits. Discussion also indicated she has been suffering pain in the neck at least a few days to weeks. She denies any recent trauma or manipulation.   OBJECTIVE CBC:   Recent Labs Lab 10/28/16 1930 10/28/16 2003 10/29/16 0110  WBC 12.3*  --  15.6*  NEUTROABS 6.0  --   --   HGB 12.7 11.2* 12.9  HCT 36.7 33.0* 38.1  MCV 87.0  --  89.0  PLT 344  --  297    Basic Metabolic Panel:   Recent Labs Lab 10/28/16 1930 10/28/16 2003 10/29/16 0110  NA 135 141 134*  K 2.6* 2.6* 3.3*  CL 101 106 97*  CO2 18*  --  25  GLUCOSE 166* 164* 164*  BUN 24* 23* 23*  CREATININE 0.94 0.80 0.86  CALCIUM 9.6  --  9.2  MG  --   --  1.9  PHOS  --   --  4.4    Lipid Panel:     Component Value Date/Time   TRIG 267 (H) 10/29/2016 0110   HgbA1c:  Lab Results  Component Value Date   HGBA1C 5.8 (H) 10/29/2016   Urine Drug Screen: No results found for: LABOPIA, COCAINSCRNUR, LABBENZ, AMPHETMU, THCU,  LABBARB  Alcohol Level No results found for: ETH  IMAGING Ct Head Code Stroke Wo Contrast 10/28/2016 1. No acute hemorrhage or mass lesion. 2. Findings of chronic microvascular disease. 3. ASPECTS is 10.  Ct Angio Head W Or Wo Contrast Ct Angio Neck W And/or Wo Contrast 10/28/2016 1. 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 Otherwise, no large vessel occlusion or high-grade stenosis. 2. Attenuated opacification of the left PICA. Otherwise, the intracranial posterior circulation is normal. 3.  Aortic Atherosclerosis (ICD10-I70.0).  Mr Brain Wo Contrast 10/29/2016 1. Several small areas of acute/early subacute infarction involving left medulla and scattered throughout the left-greater-than-right mid to inferior cerebellar hemispheres as well as vermis. No acute hemorrhage or significant mass effect. 2. 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 3. Mild chronic microvascular ischemic changes and mild parenchymal volume loss of the brain.   Dg Chest Portable 1 View 10/28/2016 1. The tip of an endotracheal tube appears to be in the proximal right mainstem bronchus. Pullback of the tube approximately 4.3 cm suggested. 2. Complete whiteout of the left hemithorax. 3. Gastric tube extends into the expected location the stomach. 10/28/2016 Endotracheal tube tip 2 cm above the carina with aeration of the left lung. The  tube could be retracted another 2 cm for optimal positioning. 10/29/2016 Endotracheal tube remains in good position. Improved aeration left lung base.  Dg Abd Portable 1 View 10/28/2016 1. The tip and side port of a gastric tube are seen in the left hemiabdomen. 2. L2 through L5 posterior lumbar fusion.   PHYSICAL EXAM  Temp:  [96.3 F (35.7 C)-97.5 F (36.4 C)] 97.4 F (36.3 C) (10/18 0405) Pulse Rate:  [65-107] 79 (10/18 1100) Resp:  [12-24] 14 (10/18 1100) BP: (93-197)/(52-118)  113/91 (10/18 1100) SpO2:  [92 %-100 %] 100 % (10/18 1100) FiO2 (%):  [36 %-100 %] 36 % (10/18 1039) Weight:  [135 lb 9.3 oz (61.5 kg)-147 lb 14.9 oz (67.1 kg)] 135 lb 9.3 oz (61.5 kg) (10/18 0207)  General - Intubated, alert, communicating nonverbally.  Cardiovascular - Regular rate and rhythm.  Mental Status -  Language assessment limited by intubation  Cranial Nerves II - XII - II - Visual field intact OU. III, IV, VI - Extraocular movements intact. V - Facial sensation intact bilaterally. VII - Facial movement intact bilaterally. VIII - Hearing & vestibular intact bilaterally. X - Palate elevates symmetrically. XI - Chin turning & shoulder shrug intact bilaterally. XII - Tongue protrusion intact.  Motor Strength - The patient's strength was normal in all extremities and pronator drift was absent.  .   Motor Tone - Muscle tone was normal throughout  Reflexes - The patient's reflexes were 1+ in all extremities and she had no pathological reflexes.  Sensory - Light touch was symmetrical.    Coordination - The patient had normal movements in the hands and feet with no ataxia or dysmetria.  Tremor was absent.  Gait and Station - Not assessed   ASSESSMENT/PLAN [name] is a 76 y.o. female with history of hypertension presenting with dizziness, vomiting, syncope. She did not receive IV t-PA due to initial presentation not focal indicating obvious CVA, indeterminate age lesion on initial imaging.   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 Possibly secondary to dissection.  Resultant   Dizziness and gait ataxia  CT head No acute lesion  MRI head Several small areas of acute/early subacute infarction involving left medulla and scattered throughout the left-greater-than-right mid to inferior cerebellar hemispheres as well as vermis  CTA Neck Age-indeterminate occlusion of the left vertebral artery at the  V1 V2 junction  2D Echo  Pending  LDL pending  HgbA1c 5.8%  Heparin for VTE prophylaxis Diet NPO time specified  No antithrombotic prior to admission, now on aspirin 81 mg and clopidogrel 75mg  daily  Patient counseled to be compliant with her antithrombotic medications  Ongoing aggressive stroke risk factor management  Therapy recommendations:  Pending extubation  Disposition:  Pending  Hypertension  Stable Permissive hypertension (OK if < 220/120) but gradually normalize in 5-7 days Long-term BP goal normotensive  Hyperlipidemia  Home meds:  none  LDL pending, goal < 70  Diabetes  HgbA1c 5.8%, goal < 7.0  Controlled  Other Stroke Risk Factors  Advanced age  Other Active Problems  Intubation for respiratory insufficiency appropriate for extubation, per [facility] day # 1  I have personally examined this patient, reviewed notes, independently viewed imaging studies, participated in medical decision making and plan of care.ROS completed by me personally and pertinent positives fully documented  I have made any additions or clarifications directly to the above note.  She presented with syncopal episode followed by  dizziness and ataxia secondary to left vertebral artery occlusion etiology unclear as to dissection versus atherosclerotic occlusion. Middle, and extubate as tolerated. Aspirin and Plavix dual antiplatelet therapy for 3 months followed by aspirin alone. Continue ongoing stroke workup. Long discussion with the patient, son and multiple family members as well as pulmonary critical care team and answered questions. The patient may also consider possible participation in the stroke atrial fibrillation trial if interested. They will be given information to review and decide.I spent 35  minutes in total face-to-face time with the patient, more than 50% of which was spent in counseling and coordination of care, reviewing test results, reviewing medication and  discussing or reviewing the diagnosis of vertebral artery occlusion, brainstem and cerebellar strokes   , the prognosis and treatment options.   Delia HeadyPramod Sethi, MD Medical Director [facility] Pager: [phone] 10/29/2016 3:37 PM   To contact Stroke Continuity provider, please refer to [email or web address]. After hours, contact General Neurology

## 2016-10-29 NOTE — ED Notes (Signed)
Attempted report x1. 

## 2016-10-30 ENCOUNTER — Inpatient Hospital Stay (HOSPITAL_COMMUNITY): Payer: Medicare Other

## 2016-10-30 DIAGNOSIS — I639 Cerebral infarction, unspecified: Principal | ICD-10-CM

## 2016-10-30 DIAGNOSIS — J9601 Acute respiratory failure with hypoxia: Secondary | ICD-10-CM

## 2016-10-30 DIAGNOSIS — R4182 Altered mental status, unspecified: Secondary | ICD-10-CM

## 2016-10-30 LAB — BASIC METABOLIC PANEL
Anion gap: 8 (ref 5–15)
BUN: 20 mg/dL (ref 6–20)
CO2: 26 mmol/L (ref 22–32)
Calcium: 8.8 mg/dL — ABNORMAL LOW (ref 8.9–10.3)
Chloride: 107 mmol/L (ref 101–111)
Creatinine, Ser: 0.75 mg/dL (ref 0.44–1.00)
GFR calc Af Amer: >60 mL/min (ref >60)
GLUCOSE: 102 mg/dL — AB (ref 65–99)
POTASSIUM: 3.8 mmol/L (ref 3.5–5.1)
Sodium: 141 mmol/L (ref 135–145)

## 2016-10-30 LAB — GLUCOSE, CAPILLARY
GLUCOSE-CAPILLARY: 99 mg/dL (ref 65–99)
Glucose-Capillary: 101 mg/dL — ABNORMAL HIGH (ref 65–99)
Glucose-Capillary: 93 mg/dL (ref 65–99)
Glucose-Capillary: 85 mg/dL (ref 65–99)
Glucose-Capillary: 74 mg/dL (ref 65–99)
Glucose-Capillary: 122 mg/dL — ABNORMAL HIGH (ref 65–99)
Glucose-Capillary: 91 mg/dL (ref 65–99)

## 2016-10-30 LAB — CBC
HEMATOCRIT: 33.5 % — AB (ref 36.0–46.0)
Hemoglobin: 10.9 g/dL — ABNORMAL LOW (ref 12.0–15.0)
MCH: 29.8 pg (ref 26.0–34.0)
MCHC: 32.5 g/dL (ref 30.0–36.0)
MCV: 91.5 fL (ref 78.0–100.0)
Platelets: 249 10*3/uL (ref 150–400)
RBC: 3.66 MIL/uL — ABNORMAL LOW (ref 3.87–5.11)
RDW: 15.4 % (ref 11.5–15.5)
WBC: 11 10*3/uL — ABNORMAL HIGH (ref 4.0–10.5)

## 2016-10-30 LAB — PROCALCITONIN: PROCALCITONIN: 0.16 ng/mL

## 2016-10-30 MED ORDER — DEXTROSE-NACL 5-0.45 % IV SOLN
INTRAVENOUS | Status: DC
  Administered 2016-10-30 – 2016-10-31 (×2): via INTRAVENOUS

## 2016-10-30 MED ORDER — DEXTROSE 50 % IV SOLN
25.0000 mL | Freq: Once | INTRAVENOUS | Status: AC
  Administered 2016-10-30: 25 mL via INTRAVENOUS
  Filled 2016-10-30: qty 50

## 2016-10-30 MED ORDER — DEXTROSE 50 % IV SOLN
INTRAVENOUS | Status: AC
  Filled 2016-10-30: qty 50

## 2016-10-30 NOTE — Progress Notes (Addendum)
PROGRESS NOTE  [name] ZOX:[id number] DOB: 1940-07-05 DOA: 10/28/2016 PCP: Patient, No Pcp Per  HPI/Recap of past 24 hours: [name] is a 76 year old female medical history significant for hypertension presented on October 28, 2016 with syncopal episode followed by increased confusion and disorientation.  Code stroke was called however CT scan and CTA showed no evidence of any acute large vessel occlusion or dissection.  However patient was intubated for worsening mental status/somnolence and inability to protect airway in addition to hypoxia. Initiation of empiric IV vancomycin, ceftriaxone, ampicillin, and acyclovir was considered for concerns of meningitis.  Per chart review LP was unable to be obtained due to patient's recent back surgery. However antibiotics were discontinued when MRI brain showed multifocal bilateral cerebellar strokes and a left lateral medullary ischemic infarction.  MRI revealed that the indeterminate age left vertebral artery occlusion found on CTA was most likely acute given increased luminal signal on DWI and T2 weighted images.  (Per neurology plus) Given the completed nature of the infarction it was expressed to the family that endovascular treatment risk outweigh potential benefitsBy neurology. Additionally IV heparin was not started given concern for possible hemorrhagic conversion of the acute strokes patient was continued on aspirin Assessment/Plan: Active Problems:   Acute encephalopathy   Cerebral thrombosis with cerebral infarction Acute respiratory failure, likely related to syncope and acute strokes, resolved Status post extubation on 10/18.  Currently on room air  Left medullary and bilateral cerebellar infarct,present on admission No cardiac source of emboli on TTE.  LDL 134.  Completed infarction on imaging with brain MRI.  Neurologic medical team's discussion with family decided on no intravascular intervention given likely negative risk in  comparison to any potential benefit -Aspirin 81 mg, clopidogrel 75 mg to continue for 3 months followed by aspirin alone -Neurology following -PT  Dysphagia, moderate to severe pharyngeal and cervical esophageal Status post modified barium swallow.  Likely secondary effect of recent CVA. - Maintain n.p.o., ice chips as needed -IV fluids timed for now, we will add D5 half-normal saline  Hypertension, currently at goal Allow permissive hypertension in setting of recent stroke - continue to monitor, labetalol as needed  Hyperlipidemia LDL 134 -Statin once able to swallow       Code Status: Full code  Family Communication: will discuss with son by phone  Disposition Plan: Working on obtaining nutrition, PT recommendations   Consultants:  Neurology  Procedures:  TTE on October 17:EF 55-60%.  Wall motion normal.  Grade 1 diastolic dysfunction.  Pulmonary artery systolic pressure mildly increased (PA peak pressure 34 mmHg)  Antimicrobials:  None  DVT prophylaxis:  SCDs   Objective: Vitals:   10/30/16 1000 10/30/16 1100 10/30/16 1126 10/30/16 1200  BP: 134/73 133/70    Pulse: 85 77  80  Resp: (!) 24 19  15   Temp:   97.6 F (36.4 C)   TempSrc:   Oral   SpO2: 98% 96%  99%  Weight:      Height:        Intake/Output Summary (Last 24 hours) at 10/30/16 1536 Last data filed at 10/30/16 1200  Gross per 24 hour  Intake             1575 ml  Output              575 ml  Net             1000 ml   Filed Weights   10/28/16 2007 10/29/16  0207 10/30/16 0500  Weight: 67.1 kg (147 lb 14.9 oz) 61.5 kg (135 lb 9.3 oz) 62.3 kg (137 lb 5.6 oz)    Exam:   General: Lying in bed, in no apparent distress, pleasant in conversation  Cardiovascular: Regular rate and rhythm, no murmurs rubs or gallops, no JVD, no peripheral edema  Respiratory: Lungs clear to auscultation, no rales, no wheezing, no rhonchi  Abdomen: Soft, nondistended, nontender normoactive bowel  sounds  Neurologic: Alert and oriented x4 able to follow commands, 5 out of 5 strength in upper and lower extremities bilaterally,Normal tone throughout  Skin: Dry and intact  Psychiatry: Normal affect and mood   Data Reviewed: CBC:  Recent Labs Lab 10/28/16 1930 10/28/16 2003 10/29/16 0110 10/30/16 0420  WBC 12.3*  --  15.6* 11.0*  NEUTROABS 6.0  --   --   --   HGB 12.7 11.2* 12.9 10.9*  HCT 36.7 33.0* 38.1 33.5*  MCV 87.0  --  89.0 91.5  PLT 344  --  297 249   Basic Metabolic Panel:  Recent Labs Lab 10/28/16 1930 10/28/16 2003 10/29/16 0110 10/30/16 0420  NA 135 141 134* 141  K 2.6* 2.6* 3.3* 3.8  CL 101 106 97* 107  CO2 18*  --  25 26  GLUCOSE 166* 164* 164* 102*  BUN 24* 23* 23* 20  CREATININE 0.94 0.80 0.86 0.75  CALCIUM 9.6  --  9.2 8.8*  MG  --   --  1.9  --   PHOS  --   --  4.4  --    GFR: Estimated Creatinine Clearance: 53.6 mL/min (by C-G formula based on SCr of 0.75 mg/dL). Liver Function Tests:  Recent Labs Lab 10/28/16 1930  AST 32  ALT 21  ALKPHOS 116  BILITOT 0.8  PROT 7.8  ALBUMIN 4.4   No results for input(s): LIPASE, AMYLASE in the last 168 hours. No results for input(s): AMMONIA in the last 168 hours. Coagulation Profile:  Recent Labs Lab 10/28/16 1930  INR 0.97   Cardiac Enzymes: No results for input(s): CKTOTAL, CKMB, CKMBINDEX, TROPONINI in the last 168 hours. BNP (last 3 results) No results for input(s): PROBNP in the last 8760 hours. HbA1C:  Recent Labs  10/29/16 0106  HGBA1C 5.8*   CBG:  Recent Labs Lab 10/29/16 2016 10/29/16 2346 10/30/16 0317 10/30/16 0806 10/30/16 1128  GLUCAP 114* 108* 101* 93 85   Lipid Profile:  Recent Labs  10/29/16 0110 10/29/16 1206  CHOL  --  206*  HDL  --  49  LDLCALC  --  161134*  TRIG 267* 117  CHOLHDL  --  4.2   Thyroid Function Tests: No results for input(s): TSH, T4TOTAL, FREET4, T3FREE, THYROIDAB in the last 72 hours. Anemia Panel: No results for input(s):  VITAMINB12, FOLATE, FERRITIN, TIBC, IRON, RETICCTPCT in the last 72 hours. Urine analysis: No results found for: COLORURINE, APPEARANCEUR, LABSPEC, PHURINE, GLUCOSEU, HGBUR, BILIRUBINUR, KETONESUR, PROTEINUR, UROBILINOGEN, NITRITE, LEUKOCYTESUR Sepsis Labs: @LABRCNTIP (procalcitonin:4,lacticidven:4)  ) Recent Results (from the past 240 hour(s))  MRSA PCR Screening     Status: None   Collection Time: 10/29/16  2:11 AM  Result Value Ref Range Status   MRSA by PCR NEGATIVE NEGATIVE Final    Comment:        The GeneXpert MRSA Assay (FDA approved for NASAL specimens only), is one component of a comprehensive MRSA colonization surveillance program. It is not intended to diagnose MRSA infection nor to guide or monitor treatment for MRSA infections.  Studies: Ct Head Wo Contrast  Result Date: 10/29/2016 CLINICAL DATA:  Followup stroke. EXAM: CT HEAD WITHOUT CONTRAST TECHNIQUE: Contiguous axial images were obtained from the base of the skull through the vertex without intravenous contrast. COMPARISON:  CT HEAD October 28, 2016 and MRI of the head October 29, 2016 at 0006 hours FINDINGS: BRAIN: Multiple wedge-like hypodensities LEFT cerebellum corresponding to known infarcts similar in distribution given differences in imaging technique. Acute small RIGHT cerebellar infarcts. No intraparenchymal hemorrhage, mass effect nor midline shift. Narrowed though patent fourth ventricle. The ventricles and sulci are normal for age. No acute large vascular territory infarcts. No abnormal extra-axial fluid collections. Basal cisterns are patent. VASCULAR: Moderate calcific atherosclerosis of the carotid siphons. SKULL: No skull fracture. No significant scalp soft tissue swelling. SINUSES/ORBITS: The mastoid air-cells and included paranasal sinuses are well-aerated.The included ocular globes and orbital contents are non-suspicious. OTHER: None. IMPRESSION: 1. Evolving acute cerebellar infarcts.  No  hemorrhagic conversion. 2. Mild mass effect on fourth ventricle.  No hydrocephalus. Electronically Signed   By: Awilda Metro M.D.   On: 10/29/2016 18:24   Dg Swallowing Func-speech Pathology  Result Date: 10/30/2016 Please refer to "Notes" tab for Speech Pathology notes.   Scheduled Meds: . aspirin  300 mg Rectal Daily  . clopidogrel  75 mg Oral Daily  . heparin  5,000 Units Subcutaneous Q8H  . insulin aspart  0-15 Units Subcutaneous Q4H  . lidocaine (PF)  5 mL Infiltration Once  . mouth rinse  15 mL Mouth Rinse BID    Continuous Infusions: . sodium chloride 75 mL/hr at 10/30/16 0952  . sodium chloride       LOS: 2 days     Laverna Peace, MD [facility] Pager [phone]  If 7PM-7AM, please contact night-coverage [email or web address] Password [email or web address] 10/30/2016, 3:36 PM

## 2016-10-30 NOTE — Progress Notes (Signed)
Modified Barium Swallow Progress Note  Patient Details  Name: [name] MRN: [id number] Date of Birth: 07/24/1940  Today's Date: 10/30/2016  Modified Barium Swallow completed.  Full report located under Chart Review in the Imaging Section.  Brief recommendations include the following:  Clinical Impression  Pt has a moderate-severe pharyngeal and cervical esophageal dysphagia, with minimal passive of barium into the esophagus. [facility] and her swallow trigger is timely. Unfortunately she has limited anterior excursion of her hyoid, minimal to no epiglottic inversion, and reduced pharyngeal and base of tongue movement, all of which allows for incomplete airway closure and ineffective clearance of barium through the pharynx. Thin and nectar thick liquids spill into the laryngeal vestibule before, during, and even after the swallow. She has spontaneous coughing but needs Min-Mod cues from SLP to completely clear her larynx/pharynx. A chin tuck was attempted but what seemed more helpful was cues for effortful swallows and/or swallow holds, which allowed for very small amounts of liquids - especially nectar thick liquids - to enter into the esophagus. Unfortunately, this is not enough to maintain her nutritional status and she still remains at risk for aspiration with any consistency. Suspected osteophytes most prominent at C5-C6 could potentialy be further contributing to reduced UES opening, although I think that her pharyngeal deficits are playing a bigger role right now. Recommend to remain NPO except for a few ice chips after oral care to help facilitate use of the hyolaryngeal musculature. Recommend intensive SLP f/u for swallowing exercises and therapeutic trials. With approval from MD given acute CVA, pt would benefit from EMST as well.   Swallow Evaluation Recommendations       SLP Diet Recommendations: NPO;Ice chips PRN after oral care;Alternative means - temporary        Medication Administration: Via alternative means                   Other Recommendations: Have oral suction available    [name], Mort Smelser 10/30/2016,2:22 PM   [name], M.A. CCC-SLP [phone]

## 2016-10-30 NOTE — Research (Signed)
STROKE-AF Research study protocol reviewed with patient. Questions encouraged and answered. ICF given to patient for review. She would like to talk with son about study seems interested. Contact information left with ICF.

## 2016-10-30 NOTE — Progress Notes (Signed)
  Speech Language Pathology Treatment: Dysphagia  Patient Details Name: [name] MRN: [id number] DOB: 02/21/1940 Today's Date: 10/30/2016 Time: 0902-0919 SLP Time Calculation (min) (ACUTE ONLY): 17 min  Assessment / Plan / Recommendation Clinical Impression  [name] continues to have immediate coughing and audible wetness with single ice chips, with coughing to orally expectorate secretions. Min cues were provided to attempt postural strategies with subtle decrease in coughing noted with chin tuck. Suspect a more neurologically-based dysphagia. Recommend to remain NPO but to proceed with MBS to gather more diagnostic information.   HPI HPI: [name] is a 76 y.o.femalewith PMH of HTN admitted 10/17 after syncopal episode at church. 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 She was intubated 10/17-10/18.      SLP Plan  MBS       Recommendations  Diet recommendations: NPO Medication Administration: Via alternative means                Oral Care Recommendations: Oral care QID Follow up Recommendations:  (tba) SLP Visit Diagnosis: Dysphagia, unspecified (R13.10) Plan: MBS       GO                Maxcine Hamaiewonsky, Darek Eifler 10/30/2016, 9:26 AM  Maxcine HamLaura Paiewonsky, M.A. CCC-SLP [phone]

## 2016-10-30 NOTE — Progress Notes (Signed)
STROKE TEAM PROGRESS NOTE   HISTORY OF PRESENT ILLNESS (per record) [name] is an 76 y.o. female presenting to the [facility] ED via EMS after a short syncopal spell at church, which was followed by gradually worsening confusion and agitation. She had been walking out of church, when the symptoms began. Prior to her syncopal episode, she stated that she felt dizzy. She was noted to be hot and clammy, then vomited. Zofran 4 mg was administered en route.    In the ED a Code Stroke was called for acute confusion. No facial droop or limb weakness was noted.   Patient was not administered IV t-PA secondary to initial presentation was more concerning for encephalopathy than acute infarct.   SUBJECTIVE (INTERVAL HISTORY) No events overnight   OBJECTIVE   CBC:   Recent Labs Lab 10/28/16 1930  10/29/16 0110 10/30/16 0420  WBC 12.3*  --  15.6* 11.0*  NEUTROABS 6.0  --   --   --   HGB 12.7  < > 12.9 10.9*  HCT 36.7  < > 38.1 33.5*  MCV 87.0  --  89.0 91.5  PLT 344  --  297 249  < > = values in this interval not displayed.  Basic Metabolic Panel:   Recent Labs Lab 10/29/16 0110 10/30/16 0420  NA 134* 141  K 3.3* 3.8  CL 97* 107  CO2 25 26  GLUCOSE 164* 102*  BUN 23* 20  CREATININE 0.86 0.75  CALCIUM 9.2 8.8*  MG 1.9  --   PHOS 4.4  --     Lipid Panel:     Component Value Date/Time   CHOL 206 (H) 10/29/2016 1206   TRIG 117 10/29/2016 1206   HDL 49 10/29/2016 1206   CHOLHDL 4.2 10/29/2016 1206   VLDL 23 10/29/2016 1206   LDLCALC 134 (H) 10/29/2016 1206   HgbA1c:  Lab Results  Component Value Date   HGBA1C 5.8 (H) 10/29/2016   Urine Drug Screen: No results found for: LABOPIA, COCAINSCRNUR, LABBENZ, AMPHETMU, THCU, LABBARB  Alcohol Level No results found for: ETH   IMAGING  Ct Head Code Stroke Wo Contrast 10/28/2016 1. No acute hemorrhage or mass lesion. 2. Findings of chronic microvascular disease. 3. ASPECTS is 10.  Ct Angio Head W Or Wo  Contrast Ct Angio Neck W And/or Wo Contrast 10/28/2016 1. 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 Otherwise, no large vessel occlusion or high-grade stenosis. 2. Attenuated opacification of the left PICA. Otherwise, the intracranial posterior circulation is normal. 3.  Aortic Atherosclerosis (ICD10-I70.0).  Mr Brain Wo Contrast 10/29/2016 1. Several small areas of acute/early subacute infarction involving left medulla and scattered throughout the left-greater-than-right mid to inferior cerebellar hemispheres as well as vermis. No acute hemorrhage or significant mass effect. 2. 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 3. Mild chronic microvascular ischemic changes and mild parenchymal volume loss of the brain.   Dg Chest Portable 1 View 10/28/2016 1. The tip of an endotracheal tube appears to be in the proximal right mainstem bronchus. Pullback of the tube approximately 4.3 cm suggested. 2. Complete whiteout of the left hemithorax. 3. Gastric tube extends into the expected location the stomach. 10/28/2016 Endotracheal tube tip 2 cm above the carina with aeration of the left lung. The tube could be retracted another 2 cm for optimal positioning. 10/29/2016 Endotracheal tube remains in good position. Improved aeration left lung base.  Dg Abd Portable 1  View 10/28/2016 1. The tip and side port of a gastric tube are seen in the left hemiabdomen. 2. L2 through L5 posterior lumbar fusion.    Transthoracic Echocardiogram  10/29/2016 Study Conclusions - Left ventricle: The cavity size was normal. Systolic function was   normal. The estimated ejection fraction was in the range of 55%   to 60%. Wall motion was normal; there were no regional wall   motion abnormalities. Doppler parameters are consistent with   abnormal left ventricular relaxation (grade 1 diastolic   dysfunction). - Pulmonary arteries:  Systolic pressure was mildly increased. PA   peak pressure: 34 mm Hg (S). Impressions: - No cardiac source of emboli was indentified.     PHYSICAL EXAM  Temp:  [98.2 F (36.8 C)-98.8 F (37.1 C)] 98.8 F (37.1 C) (10/19 0343) Pulse Rate:  [66-89] 84 (10/19 0700) Resp:  [12-20] 15 (10/19 0700) BP: (98-145)/(52-96) 126/63 (10/19 0700) SpO2:  [93 %-100 %] 94 % (10/19 0700) FiO2 (%):  [36 %-60 %] 36 % (10/18 1039) Weight:  [137 lb 5.6 oz (62.3 kg)] 137 lb 5.6 oz (62.3 kg) (10/19 0500)   Vitals:   10/30/16 0500 10/30/16 0600 10/30/16 0700 10/30/16 0804  BP: 119/65 116/69 126/63   Pulse: 73 86 84   Resp: 14 18 15    Temp:    98.2 F (36.8 C)  TempSrc:    Oral  SpO2: 94% 97% 94%   Weight: 137 lb 5.6 oz (62.3 kg)     Height:         General - Intubated, alert, communicating nonverbally.  Cardiovascular - Regular rate and rhythm.  Mental Status -  Language assessment limited by intubation  Cranial Nerves II - XII - II - Visual field intact OU. III, IV, VI - Extraocular movements intact. V - Facial sensation intact bilaterally. VII - Facial movement intact bilaterally. VIII - Hearing & vestibular intact bilaterally. X - Palate elevates symmetrically. XI - Chin turning & shoulder shrug intact bilaterally. XII - Tongue protrusion intact.  Motor Strength - The patient's strength was normal in all extremities and pronator drift was absent.  .   Motor Tone - Muscle tone was normal throughout  Reflexes - The patient's reflexes were 1+ in all extremities and she had no pathological reflexes.  Sensory - Light touch was symmetrical.    Coordination - The patient had normal movements in the hands and feet with no ataxia or dysmetria.  Tremor was absent.  Gait and Station - Not assessed   ASSESSMENT/PLAN [name] is a 76 y.o. female with history of hypertension presenting with dizziness, vomiting, syncope. She did not receive IV t-PA due to initial  presentation not focal indicating obvious CVA, indeterminate age lesion on initial imaging.   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 Possibly secondary to dissection.  Resultant   Dizziness and gait ataxia  CT head No acute lesion  MRI head Several small areas of acute/early subacute infarction involving left medulla and scattered throughout the left-greater-than-right mid to inferior cerebellar hemispheres as well as vermis  CTA Neck Age-indeterminate occlusion of the left vertebral artery at the V1 V2 junction  2D Echo - EF 55% to 60%. No cardiac source of emboli was indentified.  LDL - 134  HgbA1c 5.8%  Heparin for VTE prophylaxis Diet NPO time specified  No antithrombotic prior to admission, now on aspirin 81 mg and clopidogrel 75mg  daily  Patient counseled  to be compliant with her antithrombotic medications  Ongoing aggressive stroke risk factor management  Therapy recommendations:  Pending   Disposition:  Pending  Hypertension  Stable Permissive hypertension (OK if < 220/120) but gradually normalize in 5-7 days Long-term BP goal normotensive  Hyperlipidemia  Home meds:  none  LDL 134, goal < 70  Start Lipitor 40 mg daily when swallowing has been cleared.  Diabetes  HgbA1c 5.8%, goal < 7.0  Controlled  Other Stroke Risk Factors  Advanced age  Other Active Problems  Intubation for respiratory insufficiency -> Extubated 10/29/2016  Left vertebral artery occlusion etiology unclear as to dissection versus atherosclerotic occlusion.  Anemia  Prolonged QT interval by ECG   PLAN  Aspirin and Plavix dual antiplatelet therapy for 3 months followed by aspirin alone.   Consider possible participation in the stroke atrial fibrillation trial if interested. Pt and family to receive information.   Hospital day # 2    Personally examined patient and images, and have participated in and made  any corrections needed to history, physical, neuro exam,assessment and plan as stated above.  I have personally obtained the history, evaluated lab date, reviewed imaging studies and agree with radiology interpretations.    Naomie Dean, MD Stroke Neurology     To contact Stroke Continuity provider, please refer to [email or web address]. After hours, contact General Neurology

## 2016-10-30 NOTE — Progress Notes (Signed)
Attempt to call report. Nurse unavailable will call back. Huntley EstelleLewis, Bryker Fletchall E, RN 10/30/2016 4:09 PM

## 2016-10-30 NOTE — Progress Notes (Deleted)
PULMONARY / CRITICAL CARE MEDICINE   Name: [name] MRN: [id number] DOB: 26-Jul-1940    ADMISSION DATE:  10/28/2016 CONSULTATION DATE:  10/28/16  REFERRING MD:  Tegeler  CHIEF COMPLAINT:  AMS  HISTORY OF PRESENT ILLNESS:   [name] is a 76 y.o. female with PMH of HTN.  She was brought to [facility] ED 10/17 after she had a syncopal episode after leaving church.  She apparently was in her usual state of health prior to and during church.  When she walked out, she felt dizzy so tried to sit in a chair.  She then had a vomiting episode followed by syncope and was subsequently sent to the ED for further evaluation. In ED, code stroke was activated.  She was taken to CT where she became restless with gargled speech and inability to follow commands.  She was subsequently intubated. CT of the head was negative for acute CVA.  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 PCCM was asked to assist with vent management.    SUBJECTIVE:   Coughing last night. Says has "a lot of phlegm"  No distress.  No acute issues over-night.   VITAL SIGNS: BP 126/63   Pulse 84   Temp 98.2 F (36.8 C) (Oral)   Resp 15   Ht 5\' 2"  (1.575 m)   Wt 137 lb 5.6 oz (62.3 kg)   SpO2 94%   BMI 25.12 kg/m  Room air     INTAKE / OUTPUT: I/O last 3 completed shifts: In: 1508.6 [I.V.:1398.6; IV Piggyback:110] Out: 1802 [Urine:1800; Emesis/NG output:2]   PHYSICAL EXAMINATION:   General: elderly white female sitting up in the bed. She is in no distress. Denies discomfort Neuro:she is awake, oriented 3, moves all extremities without focal deficit HEENT: normocephalic atraumatic, mucous membranes are moist Cardiovascular: regular rate and rhythm no murmur rub or gallop noted.  Lungs: clear to auscultation, no accessory muscle use Abdomen: the abdomen is soft, nontender, no organomegaly  Musculoskeletal: no  gross deformities, no edema, equal strength grossly. Skin: warm and dry LABS:  BMET  Recent Labs Lab 10/28/16 1930 10/28/16 2003 10/29/16 0110 10/30/16 0420  NA 135 141 134* 141  K 2.6* 2.6* 3.3* 3.8  CL 101 106 97* 107  CO2 18*  --  25 26  BUN 24* 23* 23* 20  CREATININE 0.94 0.80 0.86 0.75  GLUCOSE 166* 164* 164* 102*    Electrolytes  Recent Labs Lab 10/28/16 1930 10/29/16 0110 10/30/16 0420  CALCIUM 9.6 9.2 8.8*  MG  --  1.9  --   PHOS  --  4.4  --     CBC  Recent Labs Lab 10/28/16 1930 10/28/16 2003 10/29/16 0110 10/30/16 0420  WBC 12.3*  --  15.6* 11.0*  HGB 12.7 11.2* 12.9 10.9*  HCT 36.7 33.0* 38.1 33.5*  PLT 344  --  297 249    Coag's  Recent Labs Lab 10/28/16 1930  APTT 25  INR 0.97    Sepsis Markers  Recent Labs Lab 10/29/16 0110 10/30/16 0420  PROCALCITON 0.14 0.16    ABG  Recent Labs Lab 10/28/16 2135 10/29/16 0530  PHART 7.370 7.412  PCO2ART 48.4* 45.9  PO2ART 204.0* 220*    Liver Enzymes  Recent Labs Lab 10/28/16 1930  AST 32  ALT 21  ALKPHOS 116  BILITOT 0.8  ALBUMIN 4.4    Cardiac Enzymes No results for input(s):  TROPONINI, PROBNP in the last 168 hours.  Glucose  Recent Labs Lab 10/29/16 1203 10/29/16 1546 10/29/16 2016 10/29/16 2346 10/30/16 0317 10/30/16 0806  GLUCAP 115* 122* 114* 108* 101* 93    Imaging Ct Head Wo Contrast  Result Date: 10/29/2016 CLINICAL DATA:  Followup stroke. EXAM: CT HEAD WITHOUT CONTRAST TECHNIQUE: Contiguous axial images were obtained from the base of the skull through the vertex without intravenous contrast. COMPARISON:  CT HEAD October 28, 2016 and MRI of the head October 29, 2016 at 0006 hours FINDINGS: BRAIN: Multiple wedge-like hypodensities LEFT cerebellum corresponding to known infarcts similar in distribution given differences in imaging technique. Acute small RIGHT cerebellar infarcts. No intraparenchymal hemorrhage, mass effect nor midline shift. Narrowed  though patent fourth ventricle. The ventricles and sulci are normal for age. No acute large vascular territory infarcts. No abnormal extra-axial fluid collections. Basal cisterns are patent. VASCULAR: Moderate calcific atherosclerosis of the carotid siphons. SKULL: No skull fracture. No significant scalp soft tissue swelling. SINUSES/ORBITS: The mastoid air-cells and included paranasal sinuses are well-aerated.The included ocular globes and orbital contents are non-suspicious. OTHER: None. IMPRESSION: 1. Evolving acute cerebellar infarcts.  No hemorrhagic conversion. 2. Mild mass effect on fourth ventricle.  No hydrocephalus. Electronically Signed   By: Awilda Metroourtnay  Bloomer M.D.   On: 10/29/2016 18:24     STUDIES:  CT head 10/17 > negative for acute CVA. 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   CULTURES: Blood 10/17 >  CSF 10/17 >   ANTIBIOTICS: Vanc 10/17 > 10/18 Ceftriaxone 10/17 > 10/18 Ampicillin 10/17 > 10/18 Acyclovir 10/17 > 10/18  SIGNIFICANT EVENTS: 10/17 > admit.  LINES/TUBES: ETT 10/18 >10/18  DISCUSSION: 76 y.o. female admitted 10/17 after syncopal episode at church and stroke like symptoms.  Intubated in ED.  Initial head CT negative, MRI demonstrated cerebellar strokes and medullary infarct.  Hemodynamically stable, she can move out of the intensive care. Will discuss with stroke team, decide on either assume primarily by stroke service or medical service. Critical care will sign off effective 10/20.  ASSESSMENT / PLAN:  Acute left medullary and bilateral cerebellar infarct in the setting of proximal vertebral artery occlusion Stroke team following, has resultant dizziness and gait ataxia. Echocardiogram: LVEF 55-60%.Doppler parameters consistent with grade 1 diastolic dysfunction. There is mild  elevated peak pulmonary artery pressures at 34 mm per mercury Plan: Continue aspirin and Plavix per stroke team Continue further stroke risk assessment management Mobilize Speech therapy and physical therapy consults pending  Rule out dysphasia Plan SLPs study. May need modified barium swallow for some objective study given issue with cough last night  Hx HTN. Plan: Permissive hypertension, goal blood pressure less than 220 systolic and 120 diastolic over the next 6-7 days, long-term goal will be normotension Holding antihypertensives at this point.   At risk for fluid and electrolyte balance Plan Follow-up a.m. chemistry  Mild anemia, no evidence of bleeding Plan Follow up CBC   Family updated: Family updated bedside in ICU  Interdisciplinary Family Meeting v Palliative Care Meeting:  Due by: 11/03/16.  Simonne MartinetPeter E Babcock ACNP-BC [facility] Pulmonary/Critical Care Pager # [phone] OR # [phone] if no answer   10/30/2016 8:50 AM

## 2016-10-31 DIAGNOSIS — I63112 Cerebral infarction due to embolism of left vertebral artery: Secondary | ICD-10-CM

## 2016-10-31 DIAGNOSIS — R1312 Dysphagia, oropharyngeal phase: Secondary | ICD-10-CM

## 2016-10-31 DIAGNOSIS — I6501 Occlusion and stenosis of right vertebral artery: Secondary | ICD-10-CM

## 2016-10-31 DIAGNOSIS — E785 Hyperlipidemia, unspecified: Secondary | ICD-10-CM

## 2016-10-31 LAB — GLUCOSE, CAPILLARY
GLUCOSE-CAPILLARY: 123 mg/dL — AB (ref 65–99)
GLUCOSE-CAPILLARY: 87 mg/dL (ref 65–99)
Glucose-Capillary: 97 mg/dL (ref 65–99)
Glucose-Capillary: 110 mg/dL — ABNORMAL HIGH (ref 65–99)

## 2016-10-31 MED ORDER — FREE WATER
200.0000 mL | Freq: Three times a day (TID) | Status: DC
  Administered 2016-10-31 – 2016-11-03 (×8): 200 mL

## 2016-10-31 MED ORDER — JEVITY 1.2 CAL PO LIQD
1000.0000 mL | ORAL | Status: DC
  Administered 2016-10-31: 1000 mL
  Administered 2016-11-01: 15:00:00
  Administered 2016-11-02 – 2016-11-03 (×2): 1000 mL
  Filled 2016-10-31 (×6): qty 1000

## 2016-10-31 MED ORDER — DEXTROSE-NACL 5-0.9 % IV SOLN
INTRAVENOUS | Status: DC
  Administered 2016-10-31 – 2016-11-01 (×2): via INTRAVENOUS

## 2016-10-31 MED ORDER — ATORVASTATIN CALCIUM 40 MG PO TABS
40.0000 mg | ORAL_TABLET | Freq: Every day | ORAL | Status: DC
  Administered 2016-10-31 – 2016-11-02 (×3): 40 mg via ORAL
  Filled 2016-10-31 (×3): qty 1

## 2016-10-31 NOTE — Progress Notes (Signed)
PROGRESS NOTE  Shaqueena Mauceri ZOX:[id number] DOB: 1940/07/29 DOA: 10/28/2016 PCP: Patient, No Pcp Per  HPI/Recap of past 24 hours: Ms. Devine is a 76 year old female medical history significant for hypertension presented on October 28, 2016 with syncopal episode followed by increased confusion and disorientation.  Code stroke was called however CT scan and CTA showed no evidence of any acute large vessel occlusion or dissection.  However patient was intubated for worsening mental status/somnolence and inability to protect airway in addition to hypoxia. Initiation of empiric IV vancomycin, ceftriaxone, ampicillin, and acyclovir was considered for concerns of meningitis.  Per chart review LP was unable to be obtained due to patient's recent back surgery. However antibiotics were discontinued when MRI brain showed multifocal bilateral cerebellar strokes and a left lateral medullary ischemic infarction.  MRI revealed that the indeterminate age left vertebral artery occlusion found on CTA was most likely acute given increased luminal signal on DWI and T2 weighted images.  (Per neurology plus) Given the completed nature of the infarction it was expressed to the family that endovascular treatment risk outweigh potential benefitsBy neurology. Additionally IV heparin was not started given concern for possible hemorrhagic conversion of the acute strokes patient was continued on aspirin   Patient doing well. Would like to eat but understand she has lots of secretions making it difficult for her to swallow correctly and safely. Otherwie denies fevers, chills, abdominal pain, nausea, vomiting, diarrhea.    Assessment/Plan: Active Problems:   Acute encephalopathy   Cerebral thrombosis with cerebral infarction Acute respiratory failure, likely related to syncope and acute strokes, resolved Status post extubation on 10/18.  Currently on room air  Left medullary and bilateral cerebellar infarct,present on  admission No cardiac source of emboli on TTE.  LDL 134.  Completed infarction on imaging with brain MRI.  Neurologic medical team's discussion with family decided on no intravascular intervention given likely negative risk in comparison to any potential benefit -Aspirin 81 mg, clopidogrel 75 mg to continue for 3 months followed by plavix alone -Neurology following -- 30 day event monitoring as outpatient recommended by neurology to rule out Afib -PT  Dysphagia, moderate to severe pharyngeal and cervical esophageal Status post modified barium swallow.  Likely secondary effect of recent CVA. - Maintain n.p.o., ice chips as needed -IV fluids timed for now, we will add D5 half-normal saline - Speech therapy--> temporary nutrition with NGT  Hypertension, currently at goal Allow permissive hypertension in setting of recent stroke - continue to monitor, labetalol as needed  Hyperlipidemia LDL 134 -Statin once able to swallow       Code Status: Full code  Family Communication: will discuss with son by phone  Disposition Plan: Working on obtaining nutrition, PT recommendations   Consultants:  Neurology  Procedures:  TTE on October 17:EF 55-60%.  Wall motion normal.  Grade 1 diastolic dysfunction.  Pulmonary artery systolic pressure mildly increased (PA peak pressure 34 mmHg)  Antimicrobials:  None  DVT prophylaxis:  SCDs   Objective: Vitals:   10/30/16 1736 10/30/16 2112 10/31/16 0105 10/31/16 0540  BP: (!) 147/71 125/76 121/63 138/74  Pulse: 88 87 82 84  Resp: 16 16 16 16   Temp: 98.2 F (36.8 C) 98.5 F (36.9 C) 98.4 F (36.9 C) 98.4 F (36.9 C)  TempSrc: Oral Oral Oral Oral  SpO2: 99% 95% 95% 97%  Weight:      Height:        Intake/Output Summary (Last 24 hours) at 10/31/16 0743 Last  data filed at 10/31/16 0453  Gross per 24 hour  Intake             2275 ml  Output              200 ml  Net             2075 ml   Filed Weights   10/28/16 2007 10/29/16  0207 10/30/16 0500  Weight: 67.1 kg (147 lb 14.9 oz) 61.5 kg (135 lb 9.3 oz) 62.3 kg (137 lb 5.6 oz)    Exam:   General: Lying in bed, in no apparent distress, pleasant in conversation  Cardiovascular: Regular rate and rhythm, no murmurs rubs or gallops, no JVD, no peripheral edema  Respiratory: Lungs clear to auscultation, no rales, no wheezing, no rhonchi  Abdomen: Soft, nondistended, nontender normoactive bowel sounds  Neurologic: Alert and oriented x4 able to follow commands, 5 out of 5 strength in upper and lower extremities bilaterally,Normal tone throughout  Skin: Dry and intact  Psychiatry: Normal affect and mood   Data Reviewed: CBC:  Recent Labs Lab 10/28/16 1930 10/28/16 2003 10/29/16 0110 10/30/16 0420  WBC 12.3*  --  15.6* 11.0*  NEUTROABS 6.0  --   --   --   HGB 12.7 11.2* 12.9 10.9*  HCT 36.7 33.0* 38.1 33.5*  MCV 87.0  --  89.0 91.5  PLT 344  --  297 249   Basic Metabolic Panel:  Recent Labs Lab 10/28/16 1930 10/28/16 2003 10/29/16 0110 10/30/16 0420  NA 135 141 134* 141  K 2.6* 2.6* 3.3* 3.8  CL 101 106 97* 107  CO2 18*  --  25 26  GLUCOSE 166* 164* 164* 102*  BUN 24* 23* 23* 20  CREATININE 0.94 0.80 0.86 0.75  CALCIUM 9.6  --  9.2 8.8*  MG  --   --  1.9  --   PHOS  --   --  4.4  --    GFR: Estimated Creatinine Clearance: 53.6 mL/min (by C-G formula based on SCr of 0.75 mg/dL). Liver Function Tests:  Recent Labs Lab 10/28/16 1930  AST 32  ALT 21  ALKPHOS 116  BILITOT 0.8  PROT 7.8  ALBUMIN 4.4   No results for input(s): LIPASE, AMYLASE in the last 168 hours. No results for input(s): AMMONIA in the last 168 hours. Coagulation Profile:  Recent Labs Lab 10/28/16 1930  INR 0.97   Cardiac Enzymes: No results for input(s): CKTOTAL, CKMB, CKMBINDEX, TROPONINI in the last 168 hours. BNP (last 3 results) No results for input(s): PROBNP in the last 8760 hours. HbA1C:  Recent Labs  10/29/16 0106  HGBA1C 5.8*    CBG:  Recent Labs Lab 10/30/16 1615 10/30/16 1649 10/30/16 1955 10/30/16 2346 10/31/16 0403  GLUCAP 74 122* 91 99 97   Lipid Profile:  Recent Labs  10/29/16 0110 10/29/16 1206  CHOL  --  206*  HDL  --  49  LDLCALC  --  098134*  TRIG 267* 117  CHOLHDL  --  4.2   Thyroid Function Tests: No results for input(s): TSH, T4TOTAL, FREET4, T3FREE, THYROIDAB in the last 72 hours. Anemia Panel: No results for input(s): VITAMINB12, FOLATE, FERRITIN, TIBC, IRON, RETICCTPCT in the last 72 hours. Urine analysis: No results found for: COLORURINE, APPEARANCEUR, LABSPEC, PHURINE, GLUCOSEU, HGBUR, BILIRUBINUR, KETONESUR, PROTEINUR, UROBILINOGEN, NITRITE, LEUKOCYTESUR Sepsis Labs: @LABRCNTIP (procalcitonin:4,lacticidven:4)  ) Recent Results (from the past 240 hour(s))  Culture, blood (Routine X 2) w Reflex to ID Panel  Status: None (Preliminary result)   Collection Time: 10/29/16 12:57 AM  Result Value Ref Range Status   Specimen Description BLOOD RIGHT ANTECUBITAL  Final   Special Requests   Final    BOTTLES DRAWN AEROBIC AND ANAEROBIC Blood Culture adequate volume   Culture NO GROWTH 1 DAY  Final   Report Status PENDING  Incomplete  Culture, blood (Routine X 2) w Reflex to ID Panel     Status: None (Preliminary result)   Collection Time: 10/29/16  1:05 AM  Result Value Ref Range Status   Specimen Description BLOOD RIGHT HAND  Final   Special Requests   Final    BOTTLES DRAWN AEROBIC ONLY Blood Culture adequate volume   Culture NO GROWTH 1 DAY  Final   Report Status PENDING  Incomplete  MRSA PCR Screening     Status: None   Collection Time: 10/29/16  2:11 AM  Result Value Ref Range Status   MRSA by PCR NEGATIVE NEGATIVE Final    Comment:        The GeneXpert MRSA Assay (FDA approved for NASAL specimens only), is one component of a comprehensive MRSA colonization surveillance program. It is not intended to diagnose MRSA infection nor to guide or monitor treatment  for MRSA infections.       Studies: Dg Swallowing Func-speech Pathology  Result Date: 10/30/2016 Please refer to "Notes" tab for Speech Pathology notes.   Scheduled Meds: . aspirin  300 mg Rectal Daily  . clopidogrel  75 mg Oral Daily  . heparin  5,000 Units Subcutaneous Q8H  . insulin aspart  0-15 Units Subcutaneous Q4H  . lidocaine (PF)  5 mL Infiltration Once  . mouth rinse  15 mL Mouth Rinse BID    Continuous Infusions: . dextrose 5 % and 0.9% NaCl       LOS: 3 days     Laverna Peace, MD [facility] Pager [phone]  If 7PM-7AM, please contact night-coverage [email or web address] Password [email or web address] 10/31/2016, 7:43 AM

## 2016-10-31 NOTE — Progress Notes (Signed)
Physical Therapy Evaluation Patient Details Name: [name] MRN: [id number] DOB: Mar 15, 1940 Today's Date: 10/31/2016   History of Present Illness  Pt is a 76 y.o.femalewith PMH of HTN. She was brought to [facility] ED 10/17 with AMS after she had a syncopal episode after leaving church. MRI shows: Several small areas of acute/early subacute infarction involving left medulla and scattered throughout the left-greater-than-right mid to inferior cerebellar hemispheres as well as vermis.   Clinical Impression  Patient presents with the above. Evaluated patient and noted findings below (See PT Problem List). Prior to admission, patient was independent with functional mobility and ADLs. At this time she is needing some increased assistance for mobility. Patient will benefit from HHPT to address impairments and maximize functional independence after d/c. PT will continue to follow acutely and progress as tolerated.    Follow Up Recommendations Home health PT;Supervision/Assistance - 24 hour    Equipment Recommendations   (TBD)    Recommendations for Other Services OT consult     Precautions / Restrictions Precautions Precautions: Fall Restrictions Weight Bearing Restrictions: No      Mobility  Bed Mobility Overal bed mobility: Modified Independent Bed Mobility: Supine to Sit     Supine to sit: Modified independent (Device/Increase time)     General bed mobility comments: required increased time and effort, use of bed rails to elevate trunk.  Transfers Overall transfer level: Needs assistance Equipment used: Rolling walker (2 wheeled) Transfers: Sit to/from Stand Sit to Stand: Min guard         General transfer comment: Min guard to steady in standing  Ambulation/Gait Ambulation/Gait assistance: Min guard;Min assist Ambulation Distance (Feet): 110 Feet Assistive device: Rolling walker (2 wheeled) Gait Pattern/deviations: Step-through pattern;Decreased step length -  right;Decreased step length - left;Decreased stride length;Decreased dorsiflexion - right;Drifts right/left;Narrow base of support Gait velocity: decreased Gait velocity interpretation: Below normal speed for age/gender General Gait Details: trial without RW, pt required min A with wrap around support with gait belt for stability. She demonstrated several LOB and resumed use of RW. With RW min guard for safety. Pt demonstrated several LOB also needing min A to correct. Also demonstrates mild R foot drop and decreased DF with ambulation; pt states has been her norm since back surgery  Stairs            Wheelchair Mobility    Modified Rankin (Stroke Patients Only) Modified Rankin (Stroke Patients Only) Pre-Morbid Rankin Score: No symptoms Modified Rankin: Moderate disability     Balance Overall balance assessment: Needs assistance Sitting-balance support: Feet supported;No upper extremity supported Sitting balance-Leahy Scale: Good     Standing balance support: During functional activity;No upper extremity supported Standing balance-Leahy Scale: Fair           Rhomberg - Eyes Opened: 1 Rhomberg - Eyes Closed: 1 (incr sway needing min guard for safety) High level balance activites: Head turns;Turns;Direction changes High Level Balance Comments: pt demonstrates instability with these activities, needing min A to correct for LOBs. Turns are slow and require several small steps to maintain stability             Pertinent Vitals/Pain Pain Assessment: No/denies pain    Home Living Family/patient expects to be discharged to:: Private residence Living Arrangements: Alone Available Help at Discharge: Family;Friend(s);Available PRN/intermittently Type of Home: House Home Access: Level entry     Home Layout: One level Home Equipment: Shower seat      Prior Function Level of Independence: Independent  Comments: Pt reports she was independent without AD and  able to complete all ADLs.     Hand Dominance        Extremity/Trunk Assessment   Upper Extremity Assessment Upper Extremity Assessment: Defer to OT evaluation    Lower Extremity Assessment Lower Extremity Assessment: RLE deficits/detail;LLE deficits/detail RLE Deficits / Details: R DF 4-/5; sensation intact to LT; heel to shin negative LLE Deficits / Details: gross strength 5/5; sensation intact to LT; heel to shin negative       Communication   Communication: No difficulties  Cognition Arousal/Alertness: Awake/alert Behavior During Therapy: WFL for tasks assessed/performed Overall Cognitive Status: Within Functional Limits for tasks assessed                                 General Comments: Pt was A & Ox4. Needed minimal VCs throughout session; otherwise [facility] for tasks assessed/performed      General Comments      Exercises     Assessment/Plan    PT Assessment Patient needs continued PT services  PT Problem List Decreased balance;Decreased mobility;Decreased knowledge of use of DME;Decreased safety awareness       PT Treatment Interventions DME instruction;Gait training;Stair training;Functional mobility training;Therapeutic activities;Therapeutic exercise;Balance training;Neuromuscular re-education;Patient/family education    PT Goals (Current goals can be found in the Care Plan section)  Acute Rehab PT Goals Patient Stated Goal: to go home PT Goal Formulation: With patient Time For Goal Achievement: 11/14/16 Potential to Achieve Goals: Good    Frequency Min 4X/week   Barriers to discharge        Co-evaluation               AM-PAC PT "6 Clicks" Daily Activity  Outcome Measure Difficulty turning over in bed (including adjusting bedclothes, sheets and blankets)?: None Difficulty moving from lying on back to sitting on the side of the bed? : A Little Difficulty sitting down on and standing up from a chair with arms (e.g., wheelchair,  bedside commode, etc,.)?: A Little Help needed moving to and from a bed to chair (including a wheelchair)?: A Little Help needed walking in hospital room?: A Little Help needed climbing 3-5 steps with a railing? : Total 6 Click Score: 17    End of Session Equipment Utilized During Treatment: Gait belt Activity Tolerance: Patient tolerated treatment well Patient left: in chair;with chair alarm set;with call bell/phone within reach Nurse Communication: Mobility status PT Visit Diagnosis: Unsteadiness on feet (R26.81)    Time: [id number] PT Time Calculation (min) (ACUTE ONLY): 28 min   Charges:   PT Evaluation $PT Eval Moderate Complexity: 1 Mod     PT G CodesMckinley Jewel:        A. Jamila Kariann Wecker, SPT [phone] office   Fonnie BirkenheadAndria J  Jered Heiny 10/31/2016, 11:31 AM

## 2016-10-31 NOTE — Progress Notes (Signed)
Initial Nutrition Assessment  DOCUMENTATION CODES:  Not applicable  INTERVENTION:  Initiate TF via NGT with Jevity 1.2 at goal rate of 55 ml/h (1320 ml per day) to provide 1584 kcals, 73 gm protein, 1065  ml free water daily.  If pt will not be on IVF support, recommend additional 200 cc q 8 hrs for additional 600 cc free water  NUTRITION DIAGNOSIS:  Swallowing difficulty related to CVA as evidenced by  SLP report and reccomendation for feeding tube  GOAL:  Patient will meet greater than or equal to 90% of their needs  MONITOR:  Diet advancement, Labs, Weight trends, TF tolerance, I & O's  REASON FOR ASSESSMENT:  Consult Enteral/tube feeding initiation and management  ASSESSMENT:  76 y/o female PMHx Chronic back pain, spinal decompression, HTN. Brought to ED 10/17 after syncopal episode. Intubated due to garbled speech and inability to follow commands. MRI revealed multifocal bilateral cerebellar strokes and L lateral medullary ischemic infarction. These thought related to L vertebral artery occulusion. Extubated 10/18, failed swallow eval. RD consulted for TF.   [facility] at her baseline. She ate "too well" and had no issues w/ n/v/c/d. She said her UBW was ~125 lbs, but she had gained weight due to inactivity following a relatively recent back surgery. The last time she had any nutrition was Wednesday.   NFPE: WDL  She was agreeable to a short term NGT and tolerated the procedure exceptionally well. Tube in L nare at 75 marking. Per cortrak monitor, tip postpyloric.   Labs: BG 97-123, WBC:11.0,  BGS: 85-125, LDL:134,  Meds: IVF   Recent Labs Lab 10/28/16 1930 10/28/16 2003 10/29/16 0110 10/30/16 0420  NA 135 141 134* 141  K 2.6* 2.6* 3.3* 3.8  CL 101 106 97* 107  CO2 18*  --  25 26  BUN 24* 23* 23* 20  CREATININE 0.94 0.80 0.86 0.75  CALCIUM 9.6  --  9.2 8.8*  MG  --   --  1.9  --   PHOS  --   --  4.4  --    GLUCOSE 166* 164* 164* 102*   Diet Order:  Diet NPO time specified  Skin:  Reviewed, no issues  Last BM:  10/19  Height:  Ht Readings from Last 1 Encounters:  10/29/16 5\' 2"  (1.575 m)   Weight:  Wt Readings from Last 1 Encounters:  10/30/16 137 lb 5.6 oz (62.3 kg)   Ideal Body Weight:  50 kg  BMI:  Body mass index is 25.12 kg/m.  Estimated Nutritional Needs:  Kcal:  1500-1700 (24-27kcal/kg bw) Protein:  62-75g Pro (1-1.2 g/kg bw) Fluid:  1.5-1.7 L fluid  EDUCATION NEEDS:  No education needs identified at this time  Christophe LouisNathan Sadiya Durand RD, LDN, CNSC Clinical Nutrition Pager: [phone] 10/31/2016 2:59 PM

## 2016-10-31 NOTE — Progress Notes (Signed)
STROKE TEAM PROGRESS NOTE   SUBJECTIVE (INTERVAL HISTORY) No events overnight.  No family at bedside.  Patient no complaints except failed swallow again.  Likely need NG tube for temporary nutrition support.   OBJECTIVE   CBC:   Recent Labs Lab 10/28/16 1930  10/29/16 0110 10/30/16 0420  WBC 12.3*  --  15.6* 11.0*  NEUTROABS 6.0  --   --   --   HGB 12.7  < > 12.9 10.9*  HCT 36.7  < > 38.1 33.5*  MCV 87.0  --  89.0 91.5  PLT 344  --  297 249  < > = values in this interval not displayed.  Basic Metabolic Panel:   Recent Labs Lab 10/29/16 0110 10/30/16 0420  NA 134* 141  K 3.3* 3.8  CL 97* 107  CO2 25 26  GLUCOSE 164* 102*  BUN 23* 20  CREATININE 0.86 0.75  CALCIUM 9.2 8.8*  MG 1.9  --   PHOS 4.4  --     Lipid Panel:     Component Value Date/Time   CHOL 206 (H) 10/29/2016 1206   TRIG 117 10/29/2016 1206   HDL 49 10/29/2016 1206   CHOLHDL 4.2 10/29/2016 1206   VLDL 23 10/29/2016 1206   LDLCALC 134 (H) 10/29/2016 1206   HgbA1c:  Lab Results  Component Value Date   HGBA1C 5.8 (H) 10/29/2016   Urine Drug Screen: No results found for: LABOPIA, COCAINSCRNUR, LABBENZ, AMPHETMU, THCU, LABBARB  Alcohol Level No results found for: ETH   IMAGING I have personally reviewed the radiological images below and agree with the radiology interpretations.  Ct Head Code Stroke Wo Contrast 10/28/2016 1. No acute hemorrhage or mass lesion. 2. Findings of chronic microvascular disease. 3. ASPECTS is 10.  Ct Angio Head W Or Wo Contrast Ct Angio Neck W And/or Wo Contrast 10/28/2016 1. 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 Otherwise, no large vessel occlusion or high-grade stenosis. 2. Attenuated opacification of the left PICA. Otherwise, the intracranial posterior circulation is normal. 3.  Aortic Atherosclerosis (ICD10-I70.0).  Mr Brain Wo Contrast 10/29/2016 1. Several small areas of acute/early subacute infarction  involving left medulla and scattered throughout the left-greater-than-right mid to inferior cerebellar hemispheres as well as vermis. No acute hemorrhage or significant mass effect. 2. 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 3. Mild chronic microvascular ischemic changes and mild parenchymal volume loss of the brain.   Transthoracic Echocardiogram  10/29/2016 Study Conclusions - Left ventricle: The cavity size was normal. Systolic function was   normal. The estimated ejection fraction was in the range of 55%   to 60%. Wall motion was normal; there were no regional wall   motion abnormalities. Doppler parameters are consistent with   abnormal left ventricular relaxation (grade 1 diastolic   dysfunction). - Pulmonary arteries: Systolic pressure was mildly increased. PA   peak pressure: 34 mm Hg (S). Impressions: - No cardiac source of emboli was indentified.   PHYSICAL EXAM  Temp:  [98.2 F (36.8 C)-98.5 F (36.9 C)] 98.3 F (36.8 C) (10/20 1106) Pulse Rate:  [79-89] 89 (10/20 1106) Resp:  [16-20] 18 (10/20 1106) BP: (121-149)/(63-77) 149/68 (10/20 1106) SpO2:  [95 %-100 %] 95 % (10/20 1106)   Vitals:   10/30/16 0500 10/31/16 0105 10/31/16 0540 10/31/16 1106  BP:  121/63 138/74 (!) 149/68  Pulse:  82 84 89  Resp:  16 16 18   Temp:  98.4 F (36.9  C) 98.4 F (36.9 C) 98.3 F (36.8 C)  TempSrc:  Oral Oral Oral  SpO2:  95% 97% 95%  Weight: 137 lb 5.6 oz (62.3 kg)     Height:        General -awake, alert, not in acute distress.  Cardiovascular - Regular rate and rhythm.  Mental Status -  Awake alert, orientated, no aphasia, normal naming and repetition, no neglect  Cranial Nerves II - XII - II - Visual field intact OU. III, IV, VI - Extraocular movements intact. V - Facial sensation intact bilaterally. VII - Facial movement intact bilaterally. VIII - Hearing & vestibular intact bilaterally. X - Palate elevates  symmetrically, slight hoarseness. XI - Chin turning & shoulder shrug intact bilaterally. XII - Tongue protrusion intact.  Motor Strength - The patient's strength was normal in all extremities and pronator drift was absent.  .   Motor Tone - Muscle tone was normal throughout  Reflexes - The patient's reflexes were 1+ in all extremities and she had no pathological reflexes.  Sensory - Light touch was symmetrical.    Coordination - The patient had left hand FTN mild dysmetria, coordination intact RUE and BLEs. tremor was absent.  Gait and Station - Not assessed   ASSESSMENT/PLAN Ms. Madison Direnzo is a 76 y.o. female with history of hypertension presenting with dizziness, vomiting, syncope. She did not receive IV t-PA due to initial presentation not focal indicating obvious CVA, indeterminate age lesion on initial imaging.   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  Resultant dysphagia, left UE mild dysmetria  CT head No acute lesion  MRI head Several small areas of acute/early subacute infarction involving left medulla and scattered throughout the left-greater-than-right mid to inferior cerebellar hemispheres as well as vermis  CTA head and neck Age-indeterminate occlusion of the left vertebral artery at the V1 V2 junction  2D Echo - EF 55% to 60%.  Recommend 30-day CardioNet monitoring as outpatient to rule out A. fib  LDL - 134  HgbA1c 5.8%  Heparin subcu for VTE prophylaxis Diet NPO time specified  No antithrombotic prior to admission, now on aspirin 81 mg and clopidogrel 75mg  daily.  Continue DAPT for 3 months and then Plavix alone.  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 access.  Other Stroke Risk Factors  Advanced age  Other Active Problems  Prolonged QT interval by ECG   Hospital day # 3  Neurology will sign off. Please call with questions. Pt will follow up with Dr. Pearlean Brownie at [facility] in about 6 weeks. Thanks for the consult.  Marvel Plan, MD PhD Stroke Neurology 10/31/2016 3:29 PM  To contact Stroke Continuity provider, please refer to [email or web address]. After hours, contact General Neurology

## 2016-10-31 NOTE — Progress Notes (Signed)
Cortrak Tube Team Note:  Consult received to place a Cortrak feeding tube.   A 10 F Cortrak tube was placed in the L nare and secured with a nasal bridle at 75 cm. Per the Cortrak monitor reading the tube tip is postpyloric, approximately d/1d2.   No x-ray is required. RN may begin using tube.   If the tube becomes dislodged please keep the tube and contact the Cortrak team at [email or web address] ([email or web address]) for replacement.  If after hours and replacement cannot be delayed, place a NG tube and confirm placement with an abdominal x-ray.   Donna LouisNathan [name] RD, LDN, CNSC Clinical Nutrition Pager: [phone] 10/31/2016 2:39 PM

## 2016-11-01 ENCOUNTER — Encounter (HOSPITAL_COMMUNITY): Payer: Self-pay

## 2016-11-01 LAB — GLUCOSE, CAPILLARY
GLUCOSE-CAPILLARY: 104 mg/dL — AB (ref 65–99)
GLUCOSE-CAPILLARY: 105 mg/dL — AB (ref 65–99)
GLUCOSE-CAPILLARY: 105 mg/dL — AB (ref 65–99)
GLUCOSE-CAPILLARY: 112 mg/dL — AB (ref 65–99)
Glucose-Capillary: 124 mg/dL — ABNORMAL HIGH (ref 65–99)
Glucose-Capillary: 126 mg/dL — ABNORMAL HIGH (ref 65–99)

## 2016-11-01 MED ORDER — ACETAMINOPHEN 325 MG PO TABS
650.0000 mg | ORAL_TABLET | Freq: Four times a day (QID) | ORAL | Status: DC | PRN
  Administered 2016-11-02 – 2016-11-03 (×2): 650 mg via ORAL
  Filled 2016-11-01 (×4): qty 2

## 2016-11-01 NOTE — Progress Notes (Signed)
Physical Therapy Treatment Patient Details Name: [name] MRN: [id number] DOB: Oct 16, 1940 Today's Date: 11/01/2016    History of Present Illness Pt is a 76 y.o.femalewith PMH of HTN. She was brought to [facility] ED 10/17 with AMS after she had a syncopal episode after leaving [place]. MRI shows: Several small areas of acute/early subacute infarction involving left medulla and scattered throughout the left-greater-than-right mid to inferior cerebellar hemispheres as well as vermis.     PT Comments    Patient is not progressing toward mobility goals and feel at this time CIR would be most appropriate next venue of care for her. Pt is a good candidate for CIR and will be able to tolerate intensive therapies to address deficits and maximize function. She is requiring increased assistance with aspects of mobility. Treatment focus today on quality gait mechanics. Pt unable to complete more than 6 steps before LOB needing min A to correct. Demonstrates significant instability. At this time feel patient is not ready to d/c to home alone. PT will continue to follow acutely and progress as tolerated.   Follow Up Recommendations  CIR     Equipment Recommendations   (TBD)    Recommendations for Other Services OT consult;Rehab consult     Precautions / Restrictions Precautions Precautions: Fall Restrictions Weight Bearing Restrictions: No    Mobility  Bed Mobility               General bed mobility comments: Pt OOB in recliner upon arrival  Transfers Overall transfer level: Needs assistance Equipment used: None Transfers: Sit to/from Stand Sit to Stand: Min guard         General transfer comment: Min guard to steady in standing  Ambulation/Gait Ambulation/Gait assistance: Min guard;Min assist Ambulation Distance (Feet): 20 Feet (to hallway with 5 bouts of 10') Assistive device: None   Gait velocity: decreased Gait velocity interpretation: <1.8 ft/sec, indicative of  risk for recurrent falls General Gait Details: patient ambulated without AD but still demonstrating significant instability with walking. VCs for pacing and focus on taking 10 quality steps at one time. Some trials unable to complete more than 6 steps before LOB. Needing min A to correct for instability. R foot advance better today and with cues for increased DF   Stairs            Wheelchair Mobility    Modified Rankin (Stroke Patients Only) Modified Rankin (Stroke Patients Only) Pre-Morbid Rankin Score: No symptoms Modified Rankin: Moderate disability     Balance Overall balance assessment: Needs assistance Sitting-balance support: Feet supported;No upper extremity supported Sitting balance-Leahy Scale: Good     Standing balance support: During functional activity;No upper extremity supported Standing balance-Leahy Scale: Fair                              Cognition Arousal/Alertness: Awake/alert Behavior During Therapy: WFL for tasks assessed/performed Overall Cognitive Status: Within Functional Limits for tasks assessed                                        Exercises      General Comments        Pertinent Vitals/Pain Pain Assessment: No/denies pain    Home Living                      Prior  Function            PT Goals (current goals can now be found in the care plan section) Acute Rehab PT Goals Patient Stated Goal: to go home PT Goal Formulation: With patient Time For Goal Achievement: 11/14/16 Potential to Achieve Goals: Fair Progress towards PT goals: Not progressing toward goals - comment (still requiring increased awareness )    Frequency    Min 4X/week      PT Plan Discharge plan needs to be updated    Co-evaluation              AM-PAC PT "6 Clicks" Daily Activity  Outcome Measure  Difficulty turning over in bed (including adjusting bedclothes, sheets and blankets)?: None Difficulty  moving from lying on back to sitting on the side of the bed? : A Little Difficulty sitting down on and standing up from a chair with arms (e.g., wheelchair, bedside commode, etc,.)?: A Little Help needed moving to and from a bed to chair (including a wheelchair)?: A Lot Help needed walking in hospital room?: A Lot Help needed climbing 3-5 steps with a railing? : Total 6 Click Score: 15    End of Session Equipment Utilized During Treatment: Gait belt Activity Tolerance: Patient tolerated treatment well Patient left: in chair;with call bell/phone within reach;with nursing/sitter in room Nurse Communication: Mobility status PT Visit Diagnosis: Unsteadiness on feet (R26.81)     Time: 1035-1050 PT Time Calculation (min) (ACUTE ONLY): 15 min  Charges:  $Gait Training: 8-22 mins                    G CodesMckinley [name]:       [name], SPT [phone] office    Donna BirkenheadAndria J  Uziel [name] 11/01/2016, 11:06 AM

## 2016-11-01 NOTE — Progress Notes (Signed)
PROGRESS NOTE  [name]:[id number] DOB: 1940-06-22 DOA: 10/28/2016 PCP: Patient, No Pcp Per  HPI/Recap of past 24 hours: [name] is a 76 year old female medical history significant for hypertension presented on October 28, 2016 with syncopal episode followed by increased confusion and disorientation.  Code stroke was called however CT scan and CTA showed no evidence of any acute large vessel occlusion or dissection.  However patient was intubated for worsening mental status/somnolence and inability to protect airway in addition to hypoxia. Initiation of empiric IV vancomycin, ceftriaxone, ampicillin, and acyclovir was considered for concerns of meningitis.  Per chart review LP was unable to be obtained due to patient's recent back surgery. However antibiotics were discontinued when MRI brain showed multifocal bilateral cerebellar strokes and a left lateral medullary ischemic infarction.  MRI revealed that the indeterminate age left vertebral artery occlusion found on CTA was most likely acute given increased luminal signal on DWI and T2 weighted images.  (Per neurology plus) Given the completed nature of the infarction it was expressed to the family that endovascular treatment risk outweigh potential benefitsBy neurology. Additionally IV heparin was not started given concern for possible hemorrhagic conversion of the acute strokes patient was continued on aspirin  No complaints this morning.  Patient tolerated core tract placement yesterday.  Other than some mild pain patient reports no chest pain, abdominal pain, nausea or vomiting.   Assessment/Plan: Active Problems:   Acute encephalopathy   Cerebral thrombosis with cerebral infarction Acute respiratory failure, likely related to syncope and acute strokes, resolved Status post extubation on 10/18.  Currently on room air  Left medullary and bilateral cerebellar infarct,present on admission No cardiac source of emboli on TTE.   LDL 134.  Completed infarction on imaging with brain MRI.  Neurologic medical team's discussion with family decided on no intravascular intervention given likely negative risk in comparison to any potential benefit -Aspirin 81 mg, clopidogrel 75 mg to continue for 3 months followed by plavix alone -Neurology following -- 30 day event monitoring as outpatient recommended by neurology to rule out Afib -Awaiting PT evaluation for dispo --encouraged out of bed to chair.   Dysphagia, moderate to severe pharyngeal and cervical esophageal Status post modified barium swallow.  Likely secondary effect of recent CVA. - Maintain n.p.o., ice chips as needed -d/c D5 half-normal saline --Cortrak placed  Speech therapy continuing therapy  Hypertension, currently at goal Allow permissive hypertension in setting of recent stroke - continue to monitor, labetalol as needed  Hyperlipidemia LDL 134 -Statin once able to swallow     Code Status: Full code  Family Communication: will discuss with son by phone  Disposition Plan: COrtrak placed, continuing speech therapy,  PT recommendations   Consultants:  Neurology  Procedures:  TTE on October 17:EF 55-60%.  Wall motion normal.  Grade 1 diastolic dysfunction.  Pulmonary artery systolic pressure mildly increased (PA peak pressure 34 mmHg)  Antimicrobials:  None  DVT prophylaxis:  SCDs   Objective: Vitals:   11/01/16 0452 11/01/16 0500 11/01/16 1116 11/01/16 1505  BP: (!) 158/81  (!) 145/77 (!) 162/75  Pulse: 92  87 71  Resp: 18     Temp: 98.5 F (36.9 C)  98.5 F (36.9 C) (!) 97.5 F (36.4 C)  TempSrc: Oral  Oral Oral  SpO2: 98%  100% 98%  Weight:  62.8 kg (138 lb 8 oz)    Height:  5' (1.524 m)      Intake/Output Summary (Last 24 hours) at  11/01/16 1618 Last data filed at 11/01/16 1310  Gross per 24 hour  Intake               55 ml  Output                1 ml  Net               54 ml   Filed Weights   10/31/16 0500  11/01/16 0040 11/01/16 0500  Weight: 63.5 kg (139 lb 15.9 oz) 63 kg (138 lb 14.2 oz) 62.8 kg (138 lb 8 oz)    Exam:   General: Lying in bed, in no apparent distress, pleasant in conversation  HEENT: Cortrak in place  Abdomen: Soft, nondistended, nontender normoactive bowel sounds  Neurologic: Alert and oriented x4 able to follow commands  Skin: Dry and intact  Psychiatry: Normal affect and mood   Data Reviewed: CBC:  Recent Labs Lab 10/28/16 1930 10/28/16 2003 10/29/16 0110 10/30/16 0420  WBC 12.3*  --  15.6* 11.0*  NEUTROABS 6.0  --   --   --   HGB 12.7 11.2* 12.9 10.9*  HCT 36.7 33.0* 38.1 33.5*  MCV 87.0  --  89.0 91.5  PLT 344  --  297 249   Basic Metabolic Panel:  Recent Labs Lab 10/28/16 1930 10/28/16 2003 10/29/16 0110 10/30/16 0420  NA 135 141 134* 141  K 2.6* 2.6* 3.3* 3.8  CL 101 106 97* 107  CO2 18*  --  25 26  GLUCOSE 166* 164* 164* 102*  BUN 24* 23* 23* 20  CREATININE 0.94 0.80 0.86 0.75  CALCIUM 9.6  --  9.2 8.8*  MG  --   --  1.9  --   PHOS  --   --  4.4  --    GFR: Estimated Creatinine Clearance: 51 mL/min (by C-G formula based on SCr of 0.75 mg/dL). Liver Function Tests:  Recent Labs Lab 10/28/16 1930  AST 32  ALT 21  ALKPHOS 116  BILITOT 0.8  PROT 7.8  ALBUMIN 4.4   No results for input(s): LIPASE, AMYLASE in the last 168 hours. No results for input(s): AMMONIA in the last 168 hours. Coagulation Profile:  Recent Labs Lab 10/28/16 1930  INR 0.97   Cardiac Enzymes: No results for input(s): CKTOTAL, CKMB, CKMBINDEX, TROPONINI in the last 168 hours. BNP (last 3 results) No results for input(s): PROBNP in the last 8760 hours. HbA1C: No results for input(s): HGBA1C in the last 72 hours. CBG:  Recent Labs Lab 11/01/16 0036 11/01/16 0353 11/01/16 0904 11/01/16 1133 11/01/16 1556  GLUCAP 105* 112* 124* 126* 105*   Lipid Profile: No results for input(s): CHOL, HDL, LDLCALC, TRIG, CHOLHDL, LDLDIRECT in the last 72  hours. Thyroid Function Tests: No results for input(s): TSH, T4TOTAL, FREET4, T3FREE, THYROIDAB in the last 72 hours. Anemia Panel: No results for input(s): VITAMINB12, FOLATE, FERRITIN, TIBC, IRON, RETICCTPCT in the last 72 hours. Urine analysis: No results found for: COLORURINE, APPEARANCEUR, LABSPEC, PHURINE, GLUCOSEU, HGBUR, BILIRUBINUR, KETONESUR, PROTEINUR, UROBILINOGEN, NITRITE, LEUKOCYTESUR Sepsis Labs: @LABRCNTIP (procalcitonin:4,lacticidven:4)  ) Recent Results (from the past 240 hour(s))  Culture, blood (Routine X 2) w Reflex to ID Panel     Status: None (Preliminary result)   Collection Time: 10/29/16 12:57 AM  Result Value Ref Range Status   Specimen Description BLOOD RIGHT ANTECUBITAL  Final   Special Requests   Final    BOTTLES DRAWN AEROBIC AND ANAEROBIC Blood Culture adequate volume   Culture NO  GROWTH 3 DAYS  Final   Report Status PENDING  Incomplete  Culture, blood (Routine X 2) w Reflex to ID Panel     Status: None (Preliminary result)   Collection Time: 10/29/16  1:05 AM  Result Value Ref Range Status   Specimen Description BLOOD RIGHT HAND  Final   Special Requests   Final    BOTTLES DRAWN AEROBIC ONLY Blood Culture adequate volume   Culture NO GROWTH 3 DAYS  Final   Report Status PENDING  Incomplete  MRSA PCR Screening     Status: None   Collection Time: 10/29/16  2:11 AM  Result Value Ref Range Status   MRSA by PCR NEGATIVE NEGATIVE Final    Comment:        The GeneXpert MRSA Assay (FDA approved for NASAL specimens only), is one component of a comprehensive MRSA colonization surveillance program. It is not intended to diagnose MRSA infection nor to guide or monitor treatment for MRSA infections.       Studies: No results found.  Scheduled Meds: . aspirin  300 mg Rectal Daily  . atorvastatin  40 mg Oral q1800  . clopidogrel  75 mg Oral Daily  . free water  200 mL Per Tube Q8H  . heparin  5,000 Units Subcutaneous Q8H  . insulin aspart  0-15  Units Subcutaneous Q4H  . lidocaine (PF)  5 mL Infiltration Once  . mouth rinse  15 mL Mouth Rinse BID    Continuous Infusions: . dextrose 5 % and 0.9% NaCl 100 mL/hr at 11/01/16 0932  . feeding supplement (JEVITY 1.2 CAL) 55 mL/hr at 11/01/16 1525     LOS: 4 days     Laverna Peace, MD [facility] Pager [phone]  If 7PM-7AM, please contact night-coverage [email or web address] Password [email or web address] 11/01/2016, 4:18 PM

## 2016-11-01 NOTE — Progress Notes (Signed)
  Speech Language Pathology Treatment: Dysphagia  Patient Details Name: [name] MRN: [id number] DOB: 07/01/1940 Today's Date: 11/01/2016 Time: [id number] SLP Time Calculation (min) (ACUTE ONLY): 27 min  Assessment / Plan / Recommendation Clinical Impression  Dysphagia treatment provided for PO trials and therapeutic exercises. Unfortunately pt continues to have an immediate cough following 3/4 trials of ice chips. Pt also c/o the sensation of secretions sitting in throat. Encouraged pt to continue coughing as needed and use oral suction, which she is using independently. Provided handout for exercises- effortful swallow and Mendelsohn maneuver. Pt able to complete these following instruction, with some difficulty initiating dry swallow for these exercises. Pt emphasized her motivation to make improvements with swallow function. Answered questions regarding treatment plan. Will continue to follow for therapeutic exercise and PO trials. Pt should remain NPO but allow small ice chips following oral care.   HPI HPI: Pt is a 76 y.o.femalewith PMH of HTN admitted 10/17 after syncopal episode at church. 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 She was intubated 10/17-10/18.      SLP Plan  Continue with current plan of care       Recommendations  Diet recommendations: NPO Medication Administration: Via alternative means Postural Changes and/or Swallow Maneuvers: Seated upright 90 degrees                Oral Care Recommendations: Oral care QID;Oral care prior to ice chip/H20 Follow up Recommendations: Inpatient Rehab SLP Visit Diagnosis: Dysphagia, pharyngoesophageal phase (R13.14) Plan: Continue with current plan of care       GO                [place] Kungmy K Oleksiak, MA, CCC-SLP 11/01/2016, 3:07 PM [phone]

## 2016-11-02 ENCOUNTER — Inpatient Hospital Stay (HOSPITAL_COMMUNITY): Payer: Medicare Other

## 2016-11-02 ENCOUNTER — Encounter (HOSPITAL_COMMUNITY): Payer: Self-pay | Admitting: *Deleted

## 2016-11-02 DIAGNOSIS — R1032 Left lower quadrant pain: Secondary | ICD-10-CM

## 2016-11-02 LAB — GLUCOSE, CAPILLARY
GLUCOSE-CAPILLARY: 125 mg/dL — AB (ref 65–99)
GLUCOSE-CAPILLARY: 141 mg/dL — AB (ref 65–99)
GLUCOSE-CAPILLARY: 99 mg/dL (ref 65–99)
Glucose-Capillary: 107 mg/dL — ABNORMAL HIGH (ref 65–99)
Glucose-Capillary: 118 mg/dL — ABNORMAL HIGH (ref 65–99)
Glucose-Capillary: 125 mg/dL — ABNORMAL HIGH (ref 65–99)

## 2016-11-02 MED ORDER — POLYETHYLENE GLYCOL 3350 17 G PO PACK
17.0000 g | PACK | Freq: Every day | ORAL | Status: DC
  Administered 2016-11-02 – 2016-11-03 (×2): 17 g via ORAL
  Filled 2016-11-02 (×2): qty 1

## 2016-11-02 MED ORDER — MORPHINE SULFATE (PF) 2 MG/ML IV SOLN: 2.0000 mg | Freq: Once | INTRAVENOUS | Status: DC

## 2016-11-02 MED ORDER — SENNOSIDES-DOCUSATE SODIUM 8.6-50 MG PO TABS
2.0000 | ORAL_TABLET | Freq: Two times a day (BID) | ORAL | Status: DC
  Administered 2016-11-02 – 2016-11-03 (×3): 2 via ORAL
  Filled 2016-11-02 (×3): qty 2

## 2016-11-02 NOTE — Progress Notes (Signed)
Physical Therapy Treatment Patient Details Name: [name] MRN: [id number] DOB: July 11, 1940 Today's Date: 11/02/2016    History of Present Illness Pt is a 76 y.o.femalewith PMH of HTN. She was brought to [facility] ED 10/17 with AMS after she had a syncopal episode after leaving church. MRI shows: Several small areas of acute/early subacute infarction involving left medulla and scattered throughout the left-greater-than-right mid to inferior cerebellar hemispheres as well as vermis.     PT Comments    Patient is making limited progress toward mobility goals. Treatment focus on improving gait mechanics and dynamic balance. Still demonstrating significant balance deficits and needing increased assistance needs during ambulation. Current POC remains appropriate. PT will continue to follow acutely and progress as tolerated.   Follow Up Recommendations  CIR     Equipment Recommendations   (TBD)    Recommendations for Other Services OT consult;Rehab consult     Precautions / Restrictions Precautions Precautions: Fall Restrictions Weight Bearing Restrictions: No    Mobility  Bed Mobility               General bed mobility comments: Pt OOB in recliner upon arrival  Transfers Overall transfer level: Needs assistance Equipment used: None Transfers: Sit to/from Stand Sit to Stand: Min guard         General transfer comment: Min guard to steady in standing  Ambulation/Gait Ambulation/Gait assistance: Min guard;Min assist Ambulation Distance (Feet): 15 Feet (to the hallway, followed by 6 bouts of 10 feet) Assistive device: None Gait Pattern/deviations: Step-through pattern;Decreased step length - right;Decreased step length - left;Decreased stride length;Decreased dorsiflexion - right;Drifts right/left;Narrow base of support Gait velocity: decreased Gait velocity interpretation: <1.8 ft/sec, indicative of risk for recurrent falls General Gait Details: patient  continues to demonstrate significant instability during ambulation without AD. Mulitmodal cues for pacing and focus on taking 10 quality steps at one time. with cues step length and cadence seem to improve but quickly loses balance after about 5 steps. She nees min A to correct for LOB and also reaches for other external support in the hallway.   Stairs            Wheelchair Mobility    Modified Rankin (Stroke Patients Only) Modified Rankin (Stroke Patients Only) Pre-Morbid Rankin Score: No symptoms Modified Rankin: Moderate disability     Balance Overall balance assessment: Needs assistance Sitting-balance support: Feet supported;No upper extremity supported Sitting balance-Leahy Scale: Normal Sitting balance - Comments: able to lean forward and adjust socks, return to midline without difficulty   Standing balance support: During functional activity;No upper extremity supported Standing balance-Leahy Scale: Fair Standing balance comment: able to maintain static standing for brief moment before needing support                            Cognition Arousal/Alertness: Awake/alert Behavior During Therapy: WFL for tasks assessed/performed Overall Cognitive Status: Within Functional Limits for tasks assessed                                        Exercises      General Comments        Pertinent Vitals/Pain Pain Assessment: No/denies pain    Home Living                      Prior Function  PT Goals (current goals can now be found in the care plan section) Acute Rehab PT Goals Patient Stated Goal: to go home PT Goal Formulation: With patient Time For Goal Achievement: 11/14/16 Potential to Achieve Goals: Fair Progress towards PT goals: Progressing toward goals    Frequency    Min 4X/week      PT Plan Current plan remains appropriate    Co-evaluation              AM-PAC PT "6 Clicks" Daily Activity   Outcome Measure  Difficulty turning over in bed (including adjusting bedclothes, sheets and blankets)?: None Difficulty moving from lying on back to sitting on the side of the bed? : None Difficulty sitting down on and standing up from a chair with arms (e.g., wheelchair, bedside commode, etc,.)?: A Little Help needed moving to and from a bed to chair (including a wheelchair)?: A Lot Help needed walking in hospital room?: A Lot Help needed climbing 3-5 steps with a railing? : Total 6 Click Score: 16    End of Session Equipment Utilized During Treatment: Gait belt Activity Tolerance: Patient tolerated treatment well Patient left: in chair;with call bell/phone within reach;with nursing/sitter in room Nurse Communication: Mobility status PT Visit Diagnosis: Unsteadiness on feet (R26.81)     Time: [id number] PT Time Calculation (min) (ACUTE ONLY): 20 min  Charges:  $Therapeutic Activity: 8-22 mins                    G CodesMckinley Jewel:       A. Jamila Tyrion Glaude, SPT [phone] office    Fonnie BirkenheadAndria J  Per Beagley 11/02/2016, 1:06 PM

## 2016-11-02 NOTE — Progress Notes (Signed)
  Speech Language Pathology Treatment: Dysphagia  Patient Details Name: [name] Effertz MRN: [id number] DOB: 02/20/1940 Today's Date: 11/02/2016 Time: [id number] SLP Time Calculation (min) (ACUTE ONLY): 21 min  Assessment / Plan / Recommendation Clinical Impression  Pt seen to f/u with therapeutic exercises targeting hyolaryngeal excursion. Pt reports poor tolerance of ice chips with lots of coughing; she has not been completing effortful swallow or Mendelsohn maneuver due to no further intake of ice. SLP Initiated EMST with [employer] trainer set at maximum 20cm H20. Pt able to complete 30 repetitions with minimal effort (EMST 150 not available, equipment for manometry not available). Also instructed pt in chin tuck against resistance; pt again demonstrated 10 successful CTAR exercises. Shaker also offered to try when she is in bed. Offered written instructions on exercise schedule. We discussed potential for minimal improvement after only a few days since her stroke and possible need for weeks of therapy prior to result. Will follow to retest with MBS prior to d/c.   HPI HPI: Pt is a 76 y.o.femalewith PMH of HTN admitted 10/17 after syncopal episode at church. 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 She was intubated 10/17-10/18.      SLP Plan  Continue with current plan of care       Recommendations  Diet recommendations: NPO Medication Administration: Via alternative means                Oral Care Recommendations: Oral care QID;Oral care prior to ice chip/H20 Follow up Recommendations: Inpatient Rehab SLP Visit Diagnosis: Dysphagia, pharyngoesophageal phase (R13.14) Plan: Continue with current plan of care       GO              Allegheny General HospitalBonnie Chellie Vanlue, MA CCC-SLP [phone]  Claudine MoutonDeBlois, Freida Nebel Caroline 11/02/2016, 2:21 PM

## 2016-11-02 NOTE — Care Management Important Message (Signed)
Important Message  Patient Details  Name: [name] Corvin MRN: [id number] Date of Birth: 05/25/1940   Medicare Important Message Given:  Yes    Kyla BalzarineShealy, Gerilynn Mccullars Abena 11/02/2016, 11:16 AM

## 2016-11-02 NOTE — Progress Notes (Addendum)
PROGRESS NOTE  [name] [name]:[id number]:[id number] DOB: 01/18/1940 DOA: 10/28/2016 PCP: Patient, No Pcp Per  HPI/Recap of past 24 hours: [name] is a 76 year old female medical history significant for hypertension presented on October 28, 2016 with syncopal episode followed by increased confusion and disorientation.  Code stroke was called however CT scan and CTA showed no evidence of any acute large vessel occlusion or dissection.  However patient was intubated for worsening mental status/somnolence and inability to protect airway in addition to hypoxia. Initiation of empiric IV vancomycin, ceftriaxone, ampicillin, and acyclovir was considered for concerns of meningitis.  Per chart review LP was unable to be obtained due to patient's recent back surgery. However antibiotics were discontinued when MRI brain showed multifocal bilateral cerebellar strokes and a left lateral medullary ischemic infarction.  MRI revealed that the indeterminate age left vertebral artery occlusion found on CTA was most likely acute given increased luminal signal on DWI and T2 weighted images.  (Per neurology plus) Given the completed nature of the infarction it was expressed to the family that endovascular treatment risk outweigh potential benefitsBy neurology. Additionally IV heparin was not started given concern for possible hemorrhagic conversion of the acute strokes patient was continued on aspirin  Patient endorses some right lower quadrant abdominal pain.  Patient received abdominal x-ray overnight which shows moderate amount of stool.  She denies any nausea, vomiting, fevers, chills, shortness of breath, or chest pain.   Assessment/Plan: Active Problems:   Acute encephalopathy   Cerebral thrombosis with cerebral infarction Left lower quadrant abdominal pain, likely secondary to constipation Abdominal x-ray with no acute abnormalities other than moderate stool burden physical exam with no worrisome  abnormalities -Optimize bowel regimen: Scheduled MiraLAX, senna docusate twice a day -Continue to monitor  Acute respiratory failure, likely related to syncope and acute strokes, resolved Status post extubation on 10/18.  Currently on room air  Left medullary and bilateral cerebellar infarct,present on admission No cardiac source of emboli on TTE.  LDL 134.  Completed infarction on imaging with brain MRI.  Neurologic medical team's discussion with family decided on no intravascular intervention given likely negative risk in comparison to any potential benefit -Aspirin 81 mg, clopidogrel 75 mg to continue for 3 months followed by plavix alone -Neurology following -- 30 day event monitoring as outpatient recommended by neurology to rule out Afib -PT [facility], OT consulted --encouraged out of bed to chair.   Dysphagia, moderate to severe pharyngeal and cervical esophageal Status post modified barium swallow.  Likely secondary effect of recent CVA. - Maintain n.p.o., ice chips as needed --Cortrak placed  Speech therapy continuing therapy  Hypertension, currently at goal Allow permissive hypertension in setting of recent stroke - continue to monitor, labetalol as needed  Hyperlipidemia LDL 134 -Statin once able to swallow     Code Status: Full code  Family Communication: No family at bedside, plan updated with patient   Disposition Plan: COrtrak placed, continuing speech therapy,  OT recommendations   Consultants:  Neurology  Procedures:  TTE on October 17:EF 55-60%.  Wall motion normal.  Grade 1 diastolic dysfunction.  Pulmonary artery systolic pressure mildly increased (PA peak pressure 34 mmHg)  Antimicrobials:  None  DVT prophylaxis:  SCDs   Objective: Vitals:   11/02/16 0406 11/02/16 0552 11/02/16 1010 11/02/16 1353  BP: (!) 154/78 134/67 (!) 149/82 (!) 149/70  Pulse: 88 85 86 92  Resp: 20 18 18 17   Temp: 98.2 F (36.8 C) 98.4 F  (  36.9 C) 98.7 F (37.1 C) 98.5 F (36.9 C)  TempSrc: Oral Oral Oral Oral  SpO2: 97% 94% 96% 98%  Weight:  63.8 kg (140 lb 11.2 oz)    Height:        Intake/Output Summary (Last 24 hours) at 11/02/16 1523 Last data filed at 11/02/16 0900  Gross per 24 hour  Intake             1930 ml  Output                0 ml  Net             1930 ml   Filed Weights   11/01/16 0040 11/01/16 0500 11/02/16 0552  Weight: 63 kg (138 lb 14.2 oz) 62.8 kg (138 lb 8 oz) 63.8 kg (140 lb 11.2 oz)    Exam:   General: Lying in bed, in no apparent distress, pleasant in conversation  HEENT: Cortrak in place  Abdomen: Soft, nondistended, slightly tender in left lower quadrant, no rebound tenderness or guarding  Neurologic: Alert and oriented x4 able to follow commands  Skin: Dry and intact  Psychiatry: Normal affect and mood   Data Reviewed: CBC:  Recent Labs Lab 10/28/16 1930 10/28/16 2003 10/29/16 0110 10/30/16 0420  WBC 12.3*  --  15.6* 11.0*  NEUTROABS 6.0  --   --   --   HGB 12.7 11.2* 12.9 10.9*  HCT 36.7 33.0* 38.1 33.5*  MCV 87.0  --  89.0 91.5  PLT 344  --  297 249   Basic Metabolic Panel:  Recent Labs Lab 10/28/16 1930 10/28/16 2003 10/29/16 0110 10/30/16 0420  NA 135 141 134* 141  K 2.6* 2.6* 3.3* 3.8  CL 101 106 97* 107  CO2 18*  --  25 26  GLUCOSE 166* 164* 164* 102*  BUN 24* 23* 23* 20  CREATININE 0.94 0.80 0.86 0.75  CALCIUM 9.6  --  9.2 8.8*  MG  --   --  1.9  --   PHOS  --   --  4.4  --    GFR: Estimated Creatinine Clearance: 51.4 mL/min (by C-G formula based on SCr of 0.75 mg/dL). Liver Function Tests:  Recent Labs Lab 10/28/16 1930  AST 32  ALT 21  ALKPHOS 116  BILITOT 0.8  PROT 7.8  ALBUMIN 4.4   No results for input(s): LIPASE, AMYLASE in the last 168 hours. No results for input(s): AMMONIA in the last 168 hours. Coagulation Profile:  Recent Labs Lab 10/28/16 1930  INR 0.97   Cardiac Enzymes: No results for input(s): CKTOTAL,  CKMB, CKMBINDEX, TROPONINI in the last 168 hours. BNP (last 3 results) No results for input(s): PROBNP in the last 8760 hours. HbA1C: No results for input(s): HGBA1C in the last 72 hours. CBG:  Recent Labs Lab 11/01/16 2009 11/02/16 0007 11/02/16 0422 11/02/16 0744 11/02/16 1122  GLUCAP 104* 125* 125* 141* 99   Lipid Profile: No results for input(s): CHOL, HDL, LDLCALC, TRIG, CHOLHDL, LDLDIRECT in the last 72 hours. Thyroid Function Tests: No results for input(s): TSH, T4TOTAL, FREET4, T3FREE, THYROIDAB in the last 72 hours. Anemia Panel: No results for input(s): VITAMINB12, FOLATE, FERRITIN, TIBC, IRON, RETICCTPCT in the last 72 hours. Urine analysis: No results found for: COLORURINE, APPEARANCEUR, LABSPEC, PHURINE, GLUCOSEU, HGBUR, BILIRUBINUR, KETONESUR, PROTEINUR, UROBILINOGEN, NITRITE, LEUKOCYTESUR Sepsis Labs: @LABRCNTIP (procalcitonin:4,lacticidven:4)  ) Recent Results (from the past 240 hour(s))  Culture, blood (Routine X 2) w Reflex to ID Panel  Status: None (Preliminary result)   Collection Time: 10/29/16 12:57 AM  Result Value Ref Range Status   Specimen Description BLOOD RIGHT ANTECUBITAL  Final   Special Requests   Final    BOTTLES DRAWN AEROBIC AND ANAEROBIC Blood Culture adequate volume   Culture NO GROWTH 4 DAYS  Final   Report Status PENDING  Incomplete  Culture, blood (Routine X 2) w Reflex to ID Panel     Status: None (Preliminary result)   Collection Time: 10/29/16  1:05 AM  Result Value Ref Range Status   Specimen Description BLOOD RIGHT HAND  Final   Special Requests   Final    BOTTLES DRAWN AEROBIC ONLY Blood Culture adequate volume   Culture NO GROWTH 4 DAYS  Final   Report Status PENDING  Incomplete  MRSA PCR Screening     Status: None   Collection Time: 10/29/16  2:11 AM  Result Value Ref Range Status   MRSA by PCR NEGATIVE NEGATIVE Final    Comment:        The GeneXpert MRSA Assay (FDA approved for NASAL specimens only), is one  component of a comprehensive MRSA colonization surveillance program. It is not intended to diagnose MRSA infection nor to guide or monitor treatment for MRSA infections.       Studies: Dg Abd 2 Views  Result Date: 11/02/2016 CLINICAL DATA:  Acute onset of left lower quadrant abdominal pain. Initial encounter. EXAM: ABDOMEN - 2 VIEW COMPARISON:  Abdominal radiograph performed 10/28/2016, and chest radiograph performed 10/29/2016 FINDINGS: The lungs are well-aerated and clear. There is no evidence of focal opacification, pleural effusion or pneumothorax. The lung apices are incompletely imaged on this study. The cardiomediastinal silhouette is within normal limits. The visualized bowel gas pattern is unremarkable. Scattered stool and air are seen within the colon; there is no evidence of small bowel dilatation to suggest obstruction. No free intra-abdominal air is identified on the provided upright view. The patient's enteric tube is noted ending overlying the third segment of the duodenum. Clips are noted within the right upper quadrant, reflecting prior cholecystectomy. No acute osseous abnormalities are seen; the sacroiliac joints are unremarkable in appearance. Lumbar spinal fusion hardware is noted. IMPRESSION: 1. Unremarkable bowel gas pattern; no free intra-abdominal air seen. Moderate amount of stool noted in the colon. 2. Enteric tube noted ending overlying the third segment of the duodenum. 3. No acute cardiopulmonary process seen. Electronically Signed   By: Roanna Raider M.D.   On: 11/02/2016 06:41    Scheduled Meds: . aspirin  300 mg Rectal Daily  . atorvastatin  40 mg Oral q1800  . clopidogrel  75 mg Oral Daily  . free water  200 mL Per Tube Q8H  . heparin  5,000 Units Subcutaneous Q8H  . insulin aspart  0-15 Units Subcutaneous Q4H  . lidocaine (PF)  5 mL Infiltration Once  . mouth rinse  15 mL Mouth Rinse BID  .  morphine injection  2 mg Intravenous Once  . polyethylene  glycol  17 g Oral Daily  . senna-docusate  2 tablet Oral BID    Continuous Infusions: . feeding supplement (JEVITY 1.2 CAL) 1,000 mL (11/02/16 1157)     LOS: 5 days     Laverna Peace, MD [facility] Pager [phone]  If 7PM-7AM, please contact night-coverage [email or web address] Password [facility] 11/02/2016, 3:23 PM

## 2016-11-02 NOTE — Progress Notes (Addendum)
Occupational Therapy Evaluation Patient Details Name: [name] MRN: [id number] DOB: Apr 28, 1940 Today's Date: 11/02/2016    History of Present Illness Pt is a 76 y.o.femalewith PMH of HTN. She was brought to [facility] ED 10/17 with AMS after she had a syncopal episode after leaving church. MRI shows: Several small areas of acute/early subacute infarction involving left medulla and scattered throughout the left-greater-than-right mid to inferior cerebellar hemispheres as well as vermis. Intubated 10/17. Extubated 10/18.    Clinical Impression   PTA, pt lived alone and was very active with [place] and community activities. Pt currently requires min A with mobility and ADL and would benefit from rehab at [facility] to reach  modified independent level goals to facilitate safe DC home with son. Will follow acutely to address established goals and update POC.     Follow Up Recommendations  CIR;Supervision - Intermittent    Equipment Recommendations  3 in 1 bedside commode    Recommendations for Other Services Rehab consult     Precautions / Restrictions Precautions Precautions: Fall Restrictions Weight Bearing Restrictions: No      Mobility Bed Mobility               General bed mobility comments: Pt OOB in recliner upon arrival  Transfers Overall transfer level: Needs assistance Equipment used: None Transfers: Sit to/from Stand Sit to Stand: Min assist              Balance Overall balance assessment: Needs assistance Sitting-balance support: Feet supported;No upper extremity supported Sitting balance-Leahy Scale: Good Sitting balance - Comments: able to lean forward and adjust socks, return to midline without difficulty   Standing balance support: During functional activity;No upper extremity supported Standing balance-Leahy Scale: Poor Standing balance comment: Pt requires physical assist when involving head turns                      ADL either  performed or assessed with clinical judgement   ADL Overall ADL's : Needs assistance/impaired Eating/Feeding: NPO (NG tube)   Grooming: Standing;Minimal assistance Grooming Details (indicate cue type and reason): A for balance. Unsteady when turning head Upper Body Bathing: Set up;Supervision/ safety;Sitting   Lower Body Bathing: Minimal assistance;Sit to/from stand   Upper Body Dressing : Set up;Supervision/safety;Sitting   Lower Body Dressing: Minimal assistance;Sit to/from stand   Toilet Transfer: Minimal assistance;Ambulation   Toileting- Clothing Manipulation and Hygiene: Minimal assistance;Sit to/from stand       Functional mobility during ADLs: Moderate assistance General ADL Comments: when mobilizing, pt required Mod A at times to regain balance when head turns are involved.      Vision Baseline Vision/History: Wears glasses Wears Glasses: At all times Patient Visual Report: No change from baseline Additional Comments: Appears to be intact. Will further assess     Perception Perception Comments: intact   Praxis Praxis Praxis tested?: Within functional limits    Pertinent Vitals/Pain Pain Assessment: No/denies pain Faces Pain Scale: No hurt     Hand Dominance Right   Extremity/Trunk Assessment Upper Extremity Assessment Upper Extremity Assessment: Overall WFL for tasks assessed   Lower Extremity Assessment Lower Extremity Assessment: Defer to PT evaluation   Cervical / Trunk Assessment Cervical / Trunk Assessment: Normal   Communication Communication Communication: No difficulties   Cognition Arousal/Alertness: Awake/alert Behavior During Therapy: WFL for tasks assessed/performed Overall Cognitive Status: Within Functional Limits for tasks assessed  General Comments: Appears intact. will further assess   General Comments       Exercises     Shoulder Instructions      Home Living Family/patient  expects to be discharged to:: Inpatient rehab Living Arrangements: Alone Available Help at Discharge: Family;Friend(s);Available PRN/intermittently Type of Home: House Home Access: Level entry     Home Layout: One level     Bathroom Shower/Tub: Chief Strategy OfficerTub/shower unit   Bathroom Toilet: Standard Bathroom Accessibility: Yes How Accessible: Accessible via walker Home Equipment: Shower seat          Prior Functioning/Environment Level of Independence: Independent        Comments: Pt reports she was independent without AD and able to complete all ADLs. Very active with Church and working out regularly.        OT Problem List: Decreased strength;Decreased activity tolerance;Impaired balance (sitting and/or standing)      OT Treatment/Interventions: Self-care/ADL training;Neuromuscular education;DME and/or AE instruction;Therapeutic activities;Patient/family education;Balance training    OT Goals(Current goals can be found in the care plan section) Acute Rehab OT Goals Patient Stated Goal: to go home and be independent OT Goal Formulation: With patient Time For Goal Achievement: 11/16/16 Potential to Achieve Goals: Good ADL Goals Pt Will Perform Lower Body Bathing: with modified independence;sit to/from stand Pt Will Perform Lower Body Dressing: with modified independence;sit to/from stand Pt Will Transfer to Toilet: with modified independence;bedside commode;ambulating Pt Will Perform Toileting - Clothing Manipulation and hygiene: with modified independence;sit to/from stand Pt Will Perform Tub/Shower Transfer: with modified independence;3 in 1;ambulating;rolling walker  OT Frequency: Min 2X/week   Barriers to D/C:            Co-evaluation              AM-PAC PT "6 Clicks" Daily Activity     Outcome Measure Help from another person eating meals?: Total (NPO) Help from another person taking care of personal grooming?: A Little Help from another person toileting,  which includes using toliet, bedpan, or urinal?: A Little Help from another person bathing (including washing, rinsing, drying)?: A Little Help from another person to put on and taking off regular upper body clothing?: None Help from another person to put on and taking off regular lower body clothing?: A Little 6 Click Score: 17   End of Session Equipment Utilized During Treatment: Gait belt Nurse Communication: Mobility status  Activity Tolerance: Patient tolerated treatment well Patient left: in chair;with call bell/phone within reach  OT Visit Diagnosis: Unsteadiness on feet (R26.81);Muscle weakness (generalized) (M62.81);Feeding difficulties (R63.3)                Time: [id number] OT Time Calculation (min): 19 min Charges:  OT General Charges $OT Visit: 1 Visit OT Evaluation $OT Eval Moderate Complexity: 1 Mod G-Codes:     [facility] Ousmane Seeman, OT/L  [phone] 11/02/2016  Emi Lymon,HILLARY 11/02/2016, 5:04 PM

## 2016-11-03 ENCOUNTER — Inpatient Hospital Stay (HOSPITAL_COMMUNITY)
Admission: RE | Admit: 2016-11-03 | Payer: Medicare Other | Source: Intra-hospital | Admitting: Physical Medicine & Rehabilitation

## 2016-11-03 ENCOUNTER — Inpatient Hospital Stay (HOSPITAL_COMMUNITY)
Admission: RE | Admit: 2016-11-03 | Discharge: 2016-11-19 | DRG: 093 | Disposition: A | Discharge status: Home Health | Payer: Medicare Other | Source: Intra-hospital | Attending: Physical Medicine & Rehabilitation | Admitting: Physical Medicine & Rehabilitation

## 2016-11-03 ENCOUNTER — Encounter (HOSPITAL_COMMUNITY): Payer: Self-pay | Admitting: Surgery

## 2016-11-03 ENCOUNTER — Encounter (HOSPITAL_COMMUNITY): Payer: Self-pay

## 2016-11-03 DIAGNOSIS — Z79899 Other long term (current) drug therapy: Secondary | ICD-10-CM | POA: Diagnosis not present

## 2016-11-03 DIAGNOSIS — I10 Essential (primary) hypertension: Secondary | ICD-10-CM | POA: Diagnosis present

## 2016-11-03 DIAGNOSIS — E876 Hypokalemia: Secondary | ICD-10-CM | POA: Diagnosis present

## 2016-11-03 DIAGNOSIS — I639 Cerebral infarction, unspecified: Secondary | ICD-10-CM | POA: Diagnosis not present

## 2016-11-03 DIAGNOSIS — I69391 Dysphagia following cerebral infarction: Secondary | ICD-10-CM

## 2016-11-03 DIAGNOSIS — Z79891 Long term (current) use of opiate analgesic: Secondary | ICD-10-CM | POA: Diagnosis not present

## 2016-11-03 DIAGNOSIS — R2689 Other abnormalities of gait and mobility: Secondary | ICD-10-CM | POA: Diagnosis present

## 2016-11-03 DIAGNOSIS — R1310 Dysphagia, unspecified: Secondary | ICD-10-CM | POA: Diagnosis present

## 2016-11-03 DIAGNOSIS — Z888 Allergy status to other drugs, medicaments and biological substances status: Secondary | ICD-10-CM | POA: Diagnosis not present

## 2016-11-03 DIAGNOSIS — I69322 Dysarthria following cerebral infarction: Secondary | ICD-10-CM

## 2016-11-03 DIAGNOSIS — Z9049 Acquired absence of other specified parts of digestive tract: Secondary | ICD-10-CM | POA: Diagnosis not present

## 2016-11-03 DIAGNOSIS — F329 Major depressive disorder, single episode, unspecified: Secondary | ICD-10-CM | POA: Diagnosis present

## 2016-11-03 DIAGNOSIS — R269 Unspecified abnormalities of gait and mobility: Secondary | ICD-10-CM | POA: Diagnosis not present

## 2016-11-03 DIAGNOSIS — K5900 Constipation, unspecified: Secondary | ICD-10-CM | POA: Diagnosis present

## 2016-11-03 DIAGNOSIS — I69398 Other sequelae of cerebral infarction: Secondary | ICD-10-CM | POA: Diagnosis not present

## 2016-11-03 DIAGNOSIS — E785 Hyperlipidemia, unspecified: Secondary | ICD-10-CM | POA: Diagnosis present

## 2016-11-03 DIAGNOSIS — D72829 Elevated white blood cell count, unspecified: Secondary | ICD-10-CM

## 2016-11-03 DIAGNOSIS — R1312 Dysphagia, oropharyngeal phase: Secondary | ICD-10-CM | POA: Diagnosis not present

## 2016-11-03 DIAGNOSIS — D62 Acute posthemorrhagic anemia: Secondary | ICD-10-CM

## 2016-11-03 LAB — CULTURE, BLOOD (ROUTINE X 2)
Culture: NO GROWTH
Culture: NO GROWTH
Special Requests: ADEQUATE
Special Requests: ADEQUATE

## 2016-11-03 LAB — CREATININE, SERUM
Creatinine, Ser: 0.68 mg/dL (ref 0.44–1.00)
GFR calc non Af Amer: >60 mL/min (ref >60)

## 2016-11-03 LAB — CBC
HEMATOCRIT: 32.3 % — AB (ref 36.0–46.0)
Hemoglobin: 10.6 g/dL — ABNORMAL LOW (ref 12.0–15.0)
MCH: 29.2 pg (ref 26.0–34.0)
MCHC: 32.8 g/dL (ref 30.0–36.0)
MCV: 89 fL (ref 78.0–100.0)
PLATELETS: 268 10*3/uL (ref 150–400)
RBC: 3.63 MIL/uL — ABNORMAL LOW (ref 3.87–5.11)
RDW: 14.7 % (ref 11.5–15.5)
WBC: 12.8 10*3/uL — AB (ref 4.0–10.5)

## 2016-11-03 LAB — GLUCOSE, CAPILLARY
GLUCOSE-CAPILLARY: 153 mg/dL — AB (ref 65–99)
GLUCOSE-CAPILLARY: 114 mg/dL — AB (ref 65–99)
Glucose-Capillary: 103 mg/dL — ABNORMAL HIGH (ref 65–99)
Glucose-Capillary: 103 mg/dL — ABNORMAL HIGH (ref 65–99)
Glucose-Capillary: 123 mg/dL — ABNORMAL HIGH (ref 65–99)
Glucose-Capillary: 130 mg/dL — ABNORMAL HIGH (ref 65–99)
Glucose-Capillary: 136 mg/dL — ABNORMAL HIGH (ref 65–99)

## 2016-11-03 MED ORDER — ATORVASTATIN CALCIUM 40 MG PO TABS: 40.0000 mg | ORAL_TABLET | Freq: Every day | ORAL | Status: DC

## 2016-11-03 MED ORDER — SENNOSIDES-DOCUSATE SODIUM 8.6-50 MG PO TABS: 2.0000 | ORAL_TABLET | Freq: Two times a day (BID) | ORAL | Status: DC

## 2016-11-03 MED ORDER — FREE WATER
200.0000 mL | Freq: Three times a day (TID) | Status: DC
  Administered 2016-11-03 – 2016-11-19 (×38): 200 mL

## 2016-11-03 MED ORDER — HEPARIN SODIUM (PORCINE) 5000 UNIT/ML IJ SOLN: 5000.0000 [IU] | Freq: Three times a day (TID) | INTRAMUSCULAR | Status: DC

## 2016-11-03 MED ORDER — ORAL CARE MOUTH RINSE: 15.0000 mL | Freq: Two times a day (BID) | OROMUCOSAL | 0 refills | Status: DC

## 2016-11-03 MED ORDER — LOSARTAN POTASSIUM-HCTZ 100-12.5 MG PO TABS: ORAL_TABLET | ORAL | Status: DC

## 2016-11-03 MED ORDER — WHITE PETROLATUM EX OINT: TOPICAL_OINTMENT | CUTANEOUS | Status: DC | PRN

## 2016-11-03 MED ORDER — LABETALOL HCL 5 MG/ML IV SOLN: 10.0000 mg | INTRAVENOUS | Status: DC | PRN

## 2016-11-03 MED ORDER — WHITE PETROLATUM EX OINT: 1.0000 "application " | TOPICAL_OINTMENT | CUTANEOUS | 0 refills | Status: DC | PRN

## 2016-11-03 MED ORDER — ASPIRIN 300 MG RE SUPP: 300.0000 mg | Freq: Every day | RECTAL | 0 refills | Status: DC

## 2016-11-03 MED ORDER — ORAL CARE MOUTH RINSE
15.0000 mL | Freq: Two times a day (BID) | OROMUCOSAL | Status: DC
  Administered 2016-11-05 – 2016-11-19 (×21): 15 mL via OROMUCOSAL

## 2016-11-03 MED ORDER — FREE WATER: 200.0000 mL | Freq: Three times a day (TID) | Status: DC

## 2016-11-03 MED ORDER — ATORVASTATIN CALCIUM 40 MG PO TABS
40.0000 mg | ORAL_TABLET | Freq: Every day | ORAL | Status: DC
  Administered 2016-11-04 – 2016-11-18 (×14): 40 mg
  Filled 2016-11-03 (×16): qty 1

## 2016-11-03 MED ORDER — INSULIN ASPART 100 UNIT/ML ~~LOC~~ SOLN
0.0000 [IU] | SUBCUTANEOUS | Status: DC
  Administered 2016-11-04 – 2016-11-07 (×13): 2 [IU] via SUBCUTANEOUS
  Administered 2016-11-07: 3 [IU] via SUBCUTANEOUS
  Administered 2016-11-07: 2 [IU] via SUBCUTANEOUS
  Administered 2016-11-08 (×2): 3 [IU] via SUBCUTANEOUS
  Administered 2016-11-08 – 2016-11-09 (×2): 2 [IU] via SUBCUTANEOUS
  Administered 2016-11-09: 3 [IU] via SUBCUTANEOUS
  Administered 2016-11-09 – 2016-11-11 (×8): 2 [IU] via SUBCUTANEOUS
  Administered 2016-11-12: 1 [IU] via SUBCUTANEOUS
  Administered 2016-11-12 – 2016-11-13 (×4): 2 [IU] via SUBCUTANEOUS
  Administered 2016-11-13: 3 [IU] via SUBCUTANEOUS
  Administered 2016-11-14 (×2): 2 [IU] via SUBCUTANEOUS
  Administered 2016-11-14 – 2016-11-15 (×2): 3 [IU] via SUBCUTANEOUS
  Administered 2016-11-15 (×2): 2 [IU] via SUBCUTANEOUS
  Administered 2016-11-19: 3 [IU] via SUBCUTANEOUS

## 2016-11-03 MED ORDER — ASPIRIN 300 MG RE SUPP: 300.0000 mg | Freq: Every day | RECTAL | Status: DC

## 2016-11-03 MED ORDER — INSULIN ASPART 100 UNIT/ML ~~LOC~~ SOLN: 0.0000 [IU] | SUBCUTANEOUS | 11 refills | Status: DC

## 2016-11-03 MED ORDER — HEPARIN SODIUM (PORCINE) 5000 UNIT/ML IJ SOLN
5000.0000 [IU] | Freq: Three times a day (TID) | INTRAMUSCULAR | Status: DC
  Administered 2016-11-03 – 2016-11-06 (×7): 5000 [IU] via SUBCUTANEOUS
  Filled 2016-11-03 (×8): qty 1

## 2016-11-03 MED ORDER — CHLORHEXIDINE GLUCONATE 0.12 % MT SOLN
15.0000 mL | Freq: Two times a day (BID) | OROMUCOSAL | Status: DC
  Administered 2016-11-03 – 2016-11-19 (×27): 15 mL via OROMUCOSAL
  Filled 2016-11-03 (×25): qty 15

## 2016-11-03 MED ORDER — JEVITY 1.2 CAL PO LIQD
1000.0000 mL | ORAL | Status: DC
  Administered 2016-11-03: 1000 mL
  Filled 2016-11-03: qty 1000
  Filled 2016-11-03: qty 237
  Filled 2016-11-03 (×2): qty 1000

## 2016-11-03 MED ORDER — JEVITY 1.2 CAL PO LIQD: 1000.0000 mL | ORAL | 0 refills | Status: DC

## 2016-11-03 MED ORDER — ACETAMINOPHEN 325 MG PO TABS: 650.0000 mg | ORAL_TABLET | Freq: Four times a day (QID) | ORAL | Status: DC | PRN

## 2016-11-03 MED ORDER — POLYETHYLENE GLYCOL 3350 17 G PO PACK: 17.0000 g | PACK | Freq: Every day | ORAL | 0 refills | Status: DC

## 2016-11-03 MED ORDER — CLOPIDOGREL BISULFATE 75 MG PO TABS
75.0000 mg | ORAL_TABLET | Freq: Every day | ORAL | Status: DC
  Administered 2016-11-04 – 2016-11-11 (×8): 75 mg
  Filled 2016-11-03 (×8): qty 1

## 2016-11-03 MED ORDER — CLOPIDOGREL BISULFATE 75 MG PO TABS: 75.0000 mg | ORAL_TABLET | Freq: Every day | ORAL | Status: DC

## 2016-11-03 MED ORDER — TRAMADOL HCL 50 MG PO TABS: ORAL_TABLET | ORAL | Status: DC

## 2016-11-03 MED ORDER — ACETAMINOPHEN 325 MG PO TABS
650.0000 mg | ORAL_TABLET | Freq: Four times a day (QID) | ORAL | Status: DC | PRN
  Administered 2016-11-03 – 2016-11-14 (×4): 650 mg
  Filled 2016-11-03 (×5): qty 2

## 2016-11-03 NOTE — Consult Note (Signed)
Physical Medicine and Rehabilitation Consult Reason for Consult: Decreased functional mobility Referring Physician: [facility]   HPI: [name] is a 76 y.o. right handed female with history of hypertension. Per chart review patient lives alone in a 1 level home with no steps to entry and independent prior to admission. She has a son who is disabled and can provide good support. Presented 10/28/2016 after syncopal episode after leaving church. When she walked out of church she became dizzy had an episode of vomiting followed by syncope. Cranial CT scan negative. 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 No large vessel occlusion or high-grade stenosis. MRI of the brain reviewed, showing multiple brainstem and cerebellar infarcts. Per report, several small areas of acute early subacute infarction involving left medulla and scattered throughout the left greater than right mid to inferior cerebellar hemispheres as well as vermis. No acute hemorrhage noted. Troponin negative. Hypokalemia 2.6 with supplement added. Echocardiogram with ejection fraction of 60% grade 1 diastolic dysfunction. Patient did initially require intubation for worsening mental status, somnolence, and inability to protect airway in addition to hypoxia. She was extubated 10/29/2016. Neurology consulted maintained on aspirin and Plavix therapy for CVA prophylaxis. Subcutaneous heparin for DVT prophylaxis. Patient NPO and currently with a nasogastric tube for nutritional support and follow-up speech therapy for dysphagia. Physical occupational therapy ongoing with recommendations of physical medicine rehabilitation consult.   Review of Systems  Constitutional: Negative for chills and fever.  HENT: Negative for hearing loss.   Eyes: Positive for blurred vision. Negative for double vision.  Respiratory: Negative for cough and shortness of breath.     Cardiovascular: Negative for chest pain, palpitations and leg swelling.  Gastrointestinal: Positive for constipation. Negative for nausea and vomiting.  Genitourinary: Negative for dysuria, flank pain and hematuria.  Skin: Negative for rash.  Neurological: Positive for dizziness and focal weakness. Negative for seizures and weakness.  Psychiatric/Behavioral: Positive for depression.  All other systems reviewed and are negative.  Past medical history: HTN. Past Surgical History:  Procedure Laterality Date  . BACK SURGERY     No pertinent family history of premature CVA.  Social History:  reports that she has never smoked. She has never used smokeless tobacco. She reports that she does not drink alcohol or use drugs. Allergies:  Allergies  Allergen Reactions  . Lisinopril     Dizziness per merged chart    Medications Prior to Admission  Medication Sig Dispense Refill  . losartan-hydrochlorothiazide (HYZAAR) 100-12.5 MG tablet Take 1 tablet by mouth daily.    . sertraline (ZOLOFT) 50 MG tablet Take 50 mg by mouth daily.    . traMADol (ULTRAM) 50 MG tablet Take 50 mg by mouth every 6 (six) hours as needed for moderate pain.      Home: Home Living Family/patient expects to be discharged to:: Inpatient rehab Living Arrangements: Alone Available Help at Discharge: Family, Friend(s), Available PRN/intermittently Type of Home: House Home Access: Level entry Home Layout: One level Bathroom Shower/Tub: Engineer, manufacturing systemsTub/shower unit Bathroom Toilet: Standard Bathroom Accessibility: Yes Home Equipment: Bankerhower seat  Functional History: Prior Function Level of Independence: Independent Comments: Pt reports she was independent without AD and able to complete all ADLs. Very active with Church and working out regularly. Functional Status:  Mobility: Bed Mobility Overal bed mobility: Modified Independent Bed Mobility: Supine to Sit Supine to sit: Modified independent (Device/Increase  time) General bed mobility comments: Pt  OOB in recliner upon arrival Transfers Overall transfer level: Needs assistance Equipment used: None Transfers: Sit to/from Stand Sit to Stand: Min assist General transfer comment: Min guard to steady in standing Ambulation/Gait Ambulation/Gait assistance: Min guard, Min assist Ambulation Distance (Feet): 15 Feet (to the hallway, followed by 6 bouts of 10 feet) Assistive device: None Gait Pattern/deviations: Step-through pattern, Decreased step length - right, Decreased step length - left, Decreased stride length, Decreased dorsiflexion - right, Drifts right/left, Narrow base of support General Gait Details: patient continues to demonstrate significant instability during ambulation without AD. Mulitmodal cues for pacing and focus on taking 10 quality steps at one time. with cues step length and cadence seem to improve but quickly loses balance after about 5 steps. She nees min A to correct for LOB and also reaches for other external support in the hallway. Gait velocity: decreased Gait velocity interpretation: <1.8 ft/sec, indicative of risk for recurrent falls    ADL: ADL Overall ADL's : Needs assistance/impaired Eating/Feeding: NPO (NG tube) Grooming: Standing, Minimal assistance Grooming Details (indicate cue type and reason): A for balance. Unsteady when turning head Upper Body Bathing: Set up, Supervision/ safety, Sitting Lower Body Bathing: Minimal assistance, Sit to/from stand Upper Body Dressing : Set up, Supervision/safety, Sitting Lower Body Dressing: Minimal assistance, Sit to/from stand Toilet Transfer: Minimal assistance, Ambulation Toileting- Clothing Manipulation and Hygiene: Minimal assistance, Sit to/from stand Functional mobility during ADLs: Moderate assistance General ADL Comments: when mobilizing, pt required Mod A at times to regain balance when head turns are involved.   Cognition: Cognition Overall Cognitive Status:  Within Functional Limits for tasks assessed Orientation Level: Oriented X4 Cognition Arousal/Alertness: Awake/alert Behavior During Therapy: WFL for tasks assessed/performed Overall Cognitive Status: Within Functional Limits for tasks assessed General Comments: Appears intact. will further assess  Blood pressure (!) 143/71, pulse 85, temperature 98.2 F (36.8 C), temperature source Oral, resp. rate 18, height 5' (1.524 m), weight 63.8 kg (140 lb 11.2 oz), SpO2 96 %. Physical Exam  Vitals reviewed. Constitutional: She is oriented to person, place, and time. She appears well-developed and well-nourished.  HENT:  Head: Normocephalic and atraumatic.  Nasogastric tube in place  Eyes: EOM are normal. Right eye exhibits no discharge. Left eye exhibits no discharge.  Neck: Normal range of motion. Neck supple. No thyromegaly present.  Cardiovascular: Normal rate, regular rhythm and normal heart sounds.   Respiratory: Effort normal and breath sounds normal. No respiratory distress.  GI: Soft. Bowel sounds are normal. She exhibits no distension.  Musculoskeletal: She exhibits no edema or tenderness.  Neurological: She is alert and oriented to person, place, and time.  Follows full commands Motor: 4+-5/5 throughout No ataxia, no dysmetria Sensation intact to light touch  Skin: Skin is warm and dry.  Psychiatric: She has a normal mood and affect. Her behavior is normal. Thought content normal.    Results for orders placed or performed during the hospital encounter of 10/28/16 (from the past 24 hour(s))  Glucose, capillary     Status: Abnormal   Collection Time: 11/02/16  7:44 AM  Result Value Ref Range   Glucose-Capillary 141 (H) 65 - 99 mg/dL  Glucose, capillary     Status: None   Collection Time: 11/02/16 11:22 AM  Result Value Ref Range   Glucose-Capillary 99 65 - 99 mg/dL  Glucose, capillary     Status: Abnormal   Collection Time: 11/02/16  4:56 PM  Result Value Ref Range    Glucose-Capillary 107 (H) 65 - 99  mg/dL  Glucose, capillary     Status: Abnormal   Collection Time: 11/02/16  8:13 PM  Result Value Ref Range   Glucose-Capillary 118 (H) 65 - 99 mg/dL   Comment 1 Notify RN    Comment 2 Document in Chart   Glucose, capillary     Status: Abnormal   Collection Time: 11/03/16 12:33 AM  Result Value Ref Range   Glucose-Capillary 103 (H) 65 - 99 mg/dL   Comment 1 Notify RN    Comment 2 Document in Chart   Glucose, capillary     Status: Abnormal   Collection Time: 11/03/16  3:39 AM  Result Value Ref Range   Glucose-Capillary 136 (H) 65 - 99 mg/dL   Comment 1 Notify RN    Comment 2 Document in Chart    Dg Abd 2 Views  Result Date: 11/02/2016 CLINICAL DATA:  Acute onset of left lower quadrant abdominal pain. Initial encounter. EXAM: ABDOMEN - 2 VIEW COMPARISON:  Abdominal radiograph performed 10/28/2016, and chest radiograph performed 10/29/2016 FINDINGS: The lungs are well-aerated and clear. There is no evidence of focal opacification, pleural effusion or pneumothorax. The lung apices are incompletely imaged on this study. The cardiomediastinal silhouette is within normal limits. The visualized bowel gas pattern is unremarkable. Scattered stool and air are seen within the colon; there is no evidence of small bowel dilatation to suggest obstruction. No free intra-abdominal air is identified on the provided upright view. The patient's enteric tube is noted ending overlying the third segment of the duodenum. Clips are noted within the right upper quadrant, reflecting prior cholecystectomy. No acute osseous abnormalities are seen; the sacroiliac joints are unremarkable in appearance. Lumbar spinal fusion hardware is noted. IMPRESSION: 1. Unremarkable bowel gas pattern; no free intra-abdominal air seen. Moderate amount of stool noted in the colon. 2. Enteric tube noted ending overlying the third segment of the duodenum. 3. No acute cardiopulmonary process seen.  Electronically Signed   By: Roanna Raider M.D.   On: 11/02/2016 06:41    Assessment/Plan: Diagnosis: Multiple cerebellar and brainstem infarcts. Labs and images independently reviewed.  Records reviewed and summated above. Stroke: Continue secondary stroke prophylaxis and Risk Factor Modification listed below:   Antiplatelet therapy:   Blood Pressure Management:  Continue current medication with prn's with permisive HTN per primary team Statin Agent:   Prediabetes management:  1. Does the need for close, 24 hr/day medical supervision in concert with the patient's rehab needs make it unreasonable for this patient to be served in a less intensive setting? Yes 2. Co-Morbidities requiring supervision/potential complications: HTN (monitor and provide prns in accordance with increased physical exertion and pain), diastolic dysfunction (monitor for signs/symptoms of fluid overload), post-stroke dysphagia (advance diet as tolerated), leukocytosis (cont to monitor for signs and symptoms of infection, further workup if indicated), ABLA (transfuse if necessary to ensure appropriate perfusion for increased activity tolerance) 3. Due to safety, disease management and patient education, does the patient require 24 hr/day rehab nursing? Yes 4. Does the patient require coordinated care of a physician, rehab nurse, PT (1-2 hrs/day, 5 days/week), OT (1-2 hrs/day, 5 days/week) and SLP (1-2 hrs/day, 5 days/week) to address physical and functional deficits in the context of the above medical diagnosis(es)? Yes Addressing deficits in the following areas: balance, endurance, locomotion, strength, transferring, bathing, dressing, feeding, toileting, swallowing and psychosocial support 5. Can the patient actively participate in an intensive therapy program of at least 3 hrs of therapy per day at least 5 days per  week? Yes 6. The potential for patient to make measurable gains while on inpatient rehab is  good 7. Anticipated functional outcomes upon discharge from inpatient rehab are modified independent and supervision  with PT, modified independent and supervision with OT, modified independent with SLP. 8. Estimated rehab length of stay to reach the above functional goals is: 6-10 days. 9. Anticipated D/C setting: Home 10. Anticipated post D/C treatments: HH therapy and Home excercise program 11. Overall Rehab/Functional Prognosis: good  RECOMMENDATIONS: This patient's condition is appropriate for continued rehabilitative care in the following setting: CIR Patient has agreed to participate in recommended program. Yes Note that insurance prior authorization may be required for reimbursement for recommended care.  Comment: Rehab Admissions Coordinator to follow up.  Maryla Morrow, MD, ABPMR Mariam Dollar J., PA-C 11/03/2016

## 2016-11-03 NOTE — Care Management Note (Signed)
Case Management Note  Patient Details  Name: [name] Notz MRN: [id number] Date of Birth: 03/18/1940  Subjective/Objective:    Pt admitted with CVA. She is from home alone.                 Action/Plan: PT/OT recommending CIR. CM following for d/c disposition.   Expected Discharge Date:                  Expected Discharge Plan:  IP Rehab Facility  In-House Referral:     Discharge planning Services  CM Consult  Post Acute Care Choice:    Choice offered to:     DME Arranged:    DME Agency:     HH Arranged:    HH Agency:     Status of Service:  In process, will continue to follow  If discussed at Long Length of Stay Meetings, dates discussed:    Additional Comments:  Kermit BaloKelli F Brezlyn Manrique, RN 11/03/2016, 12:14 PM

## 2016-11-03 NOTE — H&P (Signed)
Physical Medicine and Rehabilitation Admission H&P    Chief Complaint  Patient presents with  . Altered Mental Status  . Code Stroke  : ZOX:WRUE [name] is a 76 y.o. right handed female with history of hypertension. Per chart review patient lives alone in a 1 level home with no steps to entry and independent prior to admission. She has a son who is disabled and can provide good support. Presented 10/28/2016 after syncopal episode after leaving church. When she walked out of church she became dizzy had an episode of vomiting followed by syncope. Cranial CT scan negative. 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 No large vessel occlusion or high-grade stenosis. MRI of the brain reviewed, showing multiple brainstem and cerebellar infarcts. Per report, several small areas of acute early subacute infarction involving left medulla and scattered throughout the left greater than right mid to inferior cerebellar hemispheres as well as vermis. No acute hemorrhage noted. Troponin negative. Hypokalemia 2.6 with supplement added. Echocardiogram with ejection fraction of 60% grade 1 diastolic dysfunction. Patient did initially require intubation for worsening mental status, somnolence, and inability to protect airway in addition to hypoxia. She was extubated 10/29/2016. Neurology consulted maintained on aspirin and Plavix therapy for CVA prophylaxis and recommendations a 30 day event monitor as outpatient. Subcutaneous heparin for DVT prophylaxis. Patient NPO and currently with a nasogastric tube for nutritional support and follow-up speech therapy for dysphagia. Physical occupational therapy ongoing with recommendations of physical medicine rehabilitation consult. Patient was admitted for a comprehensive rehabilitation program   Review of Systems  Constitutional: Negative for chills and fever.  HENT: Negative for hearing loss.     Eyes: Positive for blurred vision. Negative for double vision.  Respiratory: Negative for shortness of breath.   Cardiovascular: Negative for chest pain, palpitations and leg swelling.  Gastrointestinal: Positive for constipation. Negative for nausea and vomiting.  Genitourinary: Negative for dysuria, flank pain and hematuria.  Musculoskeletal: Positive for back pain.  Skin: Negative for rash.  Neurological: Positive for dizziness and focal weakness.  Psychiatric/Behavioral: Positive for depression.  All other systems reviewed and are negative.  No past medical history on file. Past Surgical History:  Procedure Laterality Date  . BACK SURGERY     No family history on file. Social History:  reports that she has never smoked. She has never used smokeless tobacco. She reports that she does not drink alcohol or use drugs. Allergies:  Allergies  Allergen Reactions  . Lisinopril     Dizziness per merged chart    Medications Prior to Admission  Medication Sig Dispense Refill  . losartan-hydrochlorothiazide (HYZAAR) 100-12.5 MG tablet Take 1 tablet by mouth daily.    . sertraline (ZOLOFT) 50 MG tablet Take 50 mg by mouth daily.    . traMADol (ULTRAM) 50 MG tablet Take 50 mg by mouth every 6 (six) hours as needed for moderate pain.      Drug Regimen Review Drug regimen was reviewed and remains appropriate no significant issues identified  Home: Home Living Family/patient expects to be discharged to:: Inpatient rehab Living Arrangements: Alone Available Help at Discharge: Family, Friend(s), Available PRN/intermittently Type of Home: House Home Access: Level entry Home Layout: One level Bathroom Shower/Tub: Engineer, manufacturing systems: Standard Bathroom Accessibility: Yes Home Equipment: Software engineer History: Prior Function Level of Independence: Independent Comments: Pt reports she was independent without AD and able to complete all  ADLs. Very active with  Church and working out regularly.  Functional Status:  Mobility: Bed Mobility Overal bed mobility: Modified Independent Bed Mobility: Supine to Sit Supine to sit: Modified independent (Device/Increase time) General bed mobility comments: Pt OOB in recliner upon arrival Transfers Overall transfer level: Needs assistance Equipment used: None Transfers: Sit to/from Stand Sit to Stand: Min assist General transfer comment: Min guard to steady in standing Ambulation/Gait Ambulation/Gait assistance: Min guard, Min assist Ambulation Distance (Feet): 15 Feet (to the hallway, followed by 6 bouts of 10 feet) Assistive device: None Gait Pattern/deviations: Step-through pattern, Decreased step length - right, Decreased step length - left, Decreased stride length, Decreased dorsiflexion - right, Drifts right/left, Narrow base of support General Gait Details: patient continues to demonstrate significant instability during ambulation without AD. Mulitmodal cues for pacing and focus on taking 10 quality steps at one time. with cues step length and cadence seem to improve but quickly loses balance after about 5 steps. She nees min A to correct for LOB and also reaches for other external support in the hallway. Gait velocity: decreased Gait velocity interpretation: <1.8 ft/sec, indicative of risk for recurrent falls    ADL: ADL Overall ADL's : Needs assistance/impaired Eating/Feeding: NPO (NG tube) Grooming: Standing, Minimal assistance Grooming Details (indicate cue type and reason): A for balance. Unsteady when turning head Upper Body Bathing: Set up, Supervision/ safety, Sitting Lower Body Bathing: Minimal assistance, Sit to/from stand Upper Body Dressing : Set up, Supervision/safety, Sitting Lower Body Dressing: Minimal assistance, Sit to/from stand Toilet Transfer: Minimal assistance, Ambulation Toileting- Clothing Manipulation and Hygiene: Minimal assistance, Sit to/from stand Functional  mobility during ADLs: Moderate assistance General ADL Comments: when mobilizing, pt required Mod A at times to regain balance when head turns are involved.   Cognition: Cognition Overall Cognitive Status: Within Functional Limits for tasks assessed Orientation Level: Oriented X4 Cognition Arousal/Alertness: Awake/alert Behavior During Therapy: WFL for tasks assessed/performed Overall Cognitive Status: Within Functional Limits for tasks assessed General Comments: Appears intact. will further assess  Physical Exam: Blood pressure 131/86, pulse 98, temperature 98.4 F (36.9 C), temperature source Oral, resp. rate 20, height 5' (1.524 m), weight 62.7 kg (138 lb 3.2 oz), SpO2 96 %. Physical Exam  Vitals reviewed. HENT:  Cortrak tube in place  Eyes:  Pupils reactive to light  Neck: Normal range of motion. Neck supple. No thyromegaly present.  Cardiovascular: Normal rate, regular rhythm and normal heart sounds.  Exam reveals no friction rub.   No murmur heard. Respiratory: Effort normal and breath sounds normal. No respiratory distress.  GI: Soft. Bowel sounds are normal. She exhibits no distension. There is no tenderness. There is no rebound.  Skin. Warm and dry Musculoskeletal: She exhibits no edema or tenderness.  Neurological: She is alert and oriented to person, place, and time. Motor: 4/5 all 4 limbs proximal to distal. Normal sensation to pain and LT No limb ataxia. No nystagmus. Speech generally clear. Fair insight and awareness  Results for orders placed or performed during the hospital encounter of 10/28/16 (from the past 48 hour(s))  Glucose, capillary     Status: Abnormal   Collection Time: 11/01/16 11:33 AM  Result Value Ref Range   Glucose-Capillary 126 (H) 65 - 99 mg/dL   Comment 1 Notify RN    Comment 2 Document in Chart   Glucose, capillary     Status: Abnormal   Collection Time: 11/01/16  3:56 PM  Result Value Ref Range   Glucose-Capillary 105 (H) 65 -  99 mg/dL    Comment 1 Notify RN    Comment 2 Document in Chart   Glucose, capillary     Status: Abnormal   Collection Time: 11/01/16  8:09 PM  Result Value Ref Range   Glucose-Capillary 104 (H) 65 - 99 mg/dL   Comment 1 Notify RN    Comment 2 Document in Chart   Glucose, capillary     Status: Abnormal   Collection Time: 11/02/16 12:07 AM  Result Value Ref Range   Glucose-Capillary 125 (H) 65 - 99 mg/dL   Comment 1 Notify RN    Comment 2 Document in Chart   Glucose, capillary     Status: Abnormal   Collection Time: 11/02/16  4:22 AM  Result Value Ref Range   Glucose-Capillary 125 (H) 65 - 99 mg/dL   Comment 1 Notify RN    Comment 2 Document in Chart   Glucose, capillary     Status: Abnormal   Collection Time: 11/02/16  7:44 AM  Result Value Ref Range   Glucose-Capillary 141 (H) 65 - 99 mg/dL  Glucose, capillary     Status: None   Collection Time: 11/02/16 11:22 AM  Result Value Ref Range   Glucose-Capillary 99 65 - 99 mg/dL  Glucose, capillary     Status: Abnormal   Collection Time: 11/02/16  4:56 PM  Result Value Ref Range   Glucose-Capillary 107 (H) 65 - 99 mg/dL  Glucose, capillary     Status: Abnormal   Collection Time: 11/02/16  8:13 PM  Result Value Ref Range   Glucose-Capillary 118 (H) 65 - 99 mg/dL   Comment 1 Notify RN    Comment 2 Document in Chart   Glucose, capillary     Status: Abnormal   Collection Time: 11/03/16 12:33 AM  Result Value Ref Range   Glucose-Capillary 103 (H) 65 - 99 mg/dL   Comment 1 Notify RN    Comment 2 Document in Chart   Glucose, capillary     Status: Abnormal   Collection Time: 11/03/16  3:39 AM  Result Value Ref Range   Glucose-Capillary 136 (H) 65 - 99 mg/dL   Comment 1 Notify RN    Comment 2 Document in Chart   Glucose, capillary     Status: Abnormal   Collection Time: 11/03/16  9:35 AM  Result Value Ref Range   Glucose-Capillary 153 (H) 65 - 99 mg/dL   Dg Abd 2 Views  Result Date: 11/02/2016 CLINICAL DATA:  Acute onset of left lower  quadrant abdominal pain. Initial encounter. EXAM: ABDOMEN - 2 VIEW COMPARISON:  Abdominal radiograph performed 10/28/2016, and chest radiograph performed 10/29/2016 FINDINGS: The lungs are well-aerated and clear. There is no evidence of focal opacification, pleural effusion or pneumothorax. The lung apices are incompletely imaged on this study. The cardiomediastinal silhouette is within normal limits. The visualized bowel gas pattern is unremarkable. Scattered stool and air are seen within the colon; there is no evidence of small bowel dilatation to suggest obstruction. No free intra-abdominal air is identified on the provided upright view. The patient's enteric tube is noted ending overlying the third segment of the duodenum. Clips are noted within the right upper quadrant, reflecting prior cholecystectomy. No acute osseous abnormalities are seen; the sacroiliac joints are unremarkable in appearance. Lumbar spinal fusion hardware is noted. IMPRESSION: 1. Unremarkable bowel gas pattern; no free intra-abdominal air seen. Moderate amount of stool noted in the colon. 2. Enteric tube noted ending overlying the third segment of the  duodenum. 3. No acute cardiopulmonary process seen. Electronically Signed   By: Roanna Raider M.D.   On: 11/02/2016 06:41       Medical Problem List and Plan: 1.  Dizziness with gait deficits secondary to multiple cerebellar and brainstem infarcts. Recommendations of 30 day event monitor as outpatient  -admit to inpatient rehab 2.  DVT Prophylaxis/Anticoagulation: Subcutaneous heparin. Monitor for any bleeding episodes 3. Pain Management: Tylenol as needed 4. Mood: Zoloft 50 mg daily 5. Neuropsych: This patient is capable of making decisions on her own behalf. 6. Skin/Wound Care: Routine skin checks 7. Fluids/Electrolytes/Nutrition: Routine I&O with follow-up chemistries 8. Dysphagia. Patient with tube feeds. Follow-up speech therapy. 9. Hypertension. Permissive hypertension.  Monitor of increased mobility. Patient on Hyzaar 100-12 0.5 mg daily prior to admission. Resume as needed 10. Hyperlipidemia. Lipitor 11. Hypokalemia. Follow-up chemistries upon admit    Post Admission Physician Evaluation: 1. Functional deficits secondary  to multiple cerebellar and brainstem infarcts. 2. Patient is admitted to receive collaborative, interdisciplinary care between the physiatrist, rehab nursing staff, and therapy team. 3. Patient's level of medical complexity and substantial therapy needs in context of that medical necessity cannot be provided at a lesser intensity of care such as a SNF. 4. Patient has experienced substantial functional loss from his/her baseline which was documented above under the "Functional History" and "Functional Status" headings.  Judging by the patient's diagnosis, physical exam, and functional history, the patient has potential for functional progress which will result in measurable gains while on inpatient rehab.  These gains will be of substantial and practical use upon discharge  in facilitating mobility and self-care at the household level. 5. Physiatrist will provide 24 hour management of medical needs as well as oversight of the therapy plan/treatment and provide guidance as appropriate regarding the interaction of the two. 6. The Preadmission Screening has been reviewed and patient status is unchanged unless otherwise stated above. 7. 24 hour rehab nursing will assist with bladder management, bowel management, safety, skin/wound care, disease management, medication administration, pain management and patient education  and help integrate therapy concepts, techniques,education, etc. 8. PT will assess and treat for/with: Lower extremity strength, range of motion, stamina, balance, functional mobility, safety, adaptive techniques and equipment ,vestibular assessment, ego support, community reentry.   Goals are: mod I to supervision. 9. OT will assess and  treat for/with: ADL's, functional mobility, safety, upper extremity strength, adaptive techniques and equipment, NMR, vestibular rx, ego support.   Goals are: mod I to supervision. Therapy may proceed with showering this patient. 10. SLP will assess and treat for/with: cognition, speech, communication.  Goals are: mod I to supervision. 11. Case Management and Social Worker will assess and treat for psychological issues and discharge planning. 12. Team conference will be held weekly to assess progress toward goals and to determine barriers to discharge. 13. Patient will receive at least 3 hours of therapy per day at least 5 days per week. 14. ELOS: 7-10 days       15. Prognosis:  excellent     Ranelle Oyster, MD, [facility] Physical Medicine & Rehabilitation 11/03/2016  Charlton Amor., PA-C 11/03/2016

## 2016-11-03 NOTE — PMR Pre-admission (Signed)
PMR Admission Coordinator Pre-Admission Assessment  Patient: [name] is an 76 y.o., female MRN: [id number] DOB: [birth year] Height: 5' (152.4 cm) Weight: 62.7 kg (138 lb 3.2 oz)              Insurance Information HMO:     PPO: yes     PCP:      IPA:      80/20:      OTHER: medicare advantage plan PRIMARY: [employer] Medicare      Policy#: [id number]      Subscriber: pt CM Name: Gweneth Dimitri      Phone#: [phone]     Fax#: [phone] Pre-Cert#: [id number] for 7 days with Rebeca Alert to f/u phone [phone] fax [phone]      Employer: retired Benefits:  Phone #: [phone]     Name: 11/03/2016 Eff. Date: 01/13/16     Deduct: none      Out of Pocket Max: $4000      Life Max: none CIR: $160 co pay per day days 1 through 10      SNF: no co pay days 1-20: $50 co pay days 21-100 Outpatient: $20 co pay per visit     Co-Pay: visits per medical neccesity Home Health: 100%      Co-Pay: visits per medical neccesity DME: 80%     Co-Pay: 20% Providers: in network  SECONDARY: none      Medicaid Application Date:       Case Manager:  Disability Application Date:       Case Worker:   Emergency Contact Information Contact Information    Name Relation Home Work Mobile   Prosperity Son [phone]     Yehudis, Monceaux [phone]       Current Medical History  Patient Admitting Diagnosis: multiple cerebellar and brainstem infarcts  History of Present Illness: NAT:FTDD [name] a 76 y.o.right handed femalewith history of hypertension. Presented 10/28/2016 after syncopal episode after leaving church. When she walked out of church she became dizzy had an episode of vomiting followed by syncope. Cranial CT scan negative. 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 No large vessel occlusion or high-grade stenosis. MRI of the brain reviewed, showing multiple brainstem and cerebellar  infarcts. Per report, several small areas of acute early subacute infarction involving left medulla and scattered throughout the left greater than right mid to inferior cerebellar hemispheres as well as vermis. No acute hemorrhage noted. Troponin negative. Hypokalemia 2.6 with supplement added. Echocardiogram with ejection fraction of 60% grade 1 diastolic dysfunction. Patient did initially require intubation for worsening mental status,somnolence,and inability to protect airway in addition to hypoxia. She was extubated 10/29/2016. Neurology consulted maintained on aspirin and Plavix therapy for CVA prophylaxis and recommendations a 30 day event monitor as outpatient. Subcutaneous heparin for DVT prophylaxis. Patient NPOand currently with a nasogastric tube for nutritional support andfollow-up speech therapy for dysphagia.   Total: 0 NIHSS    Past Medical History  No past medical history on file.  Family History  family history is not on file.  Prior Rehab/Hospitalizations:  Has the patient had major surgery during 100 days prior to admission? Back surgery 05/2016  Current Medications   Current Facility-Administered Medications:  .  acetaminophen (TYLENOL) tablet 650 mg, 650 mg, Oral, Q6H PRN, Roberto Scales D, MD, 650 mg at 11/03/16 1015 .  aspirin suppository 300 mg, 300 mg, Rectal, Daily, Scatliffe, Kristen D,  MD, 300 mg at 11/03/16 1008 .  atorvastatin (LIPITOR) tablet 40 mg, 40 mg, Oral, q1800, Marvel PlanXu, Jindong, MD, 40 mg at 11/02/16 1723 .  clopidogrel (PLAVIX) tablet 75 mg, 75 mg, Oral, Daily, Rice, Jamesetta Orleanshristopher W, MD, 75 mg at 11/03/16 1008 .  etomidate (AMIDATE) injection, , Intravenous, PRN, Tegeler, Canary Brimhristopher J, MD, 20 mg at 10/28/16 2023 .  feeding supplement (JEVITY 1.2 CAL) liquid 1,000 mL, 1,000 mL, Per Tube, Continuous, Roberto ScalesNettey, Shayla D, MD, Last Rate: 55 mL/hr at 11/03/16 1009, 1,000 mL at 11/03/16 1009 .  free water 200 mL, 200 mL, Per Tube, Q8H, Nettey, Shayla D, MD, 200 mL  at 11/03/16 1400 .  heparin injection 5,000 Units, 5,000 Units, Subcutaneous, Q8H, Desai, Rahul P, PA-C, 5,000 Units at 11/03/16 1315 .  insulin aspart (novoLOG) injection 0-15 Units, 0-15 Units, Subcutaneous, Q4H, Desai, Rahul P, PA-C, 2 Units at 11/03/16 1315 .  labetalol (NORMODYNE,TRANDATE) injection 10-20 mg, 10-20 mg, Intravenous, Q2H PRN, Nicholos Johnsamachandran, Pradeep, MD .  lidocaine (PF) (XYLOCAINE) 1 % injection 5 mL, 5 mL, Infiltration, Once, Tegeler, Canary Brimhristopher J, MD .  MEDLINE mouth rinse, 15 mL, Mouth Rinse, BID, Cyril MourningAlva, Rakesh V, MD, 15 mL at 11/02/16 2200 .  morphine 2 MG/ML injection 2 mg, 2 mg, Intravenous, Once, Schorr, Crown HoldingsKatherine P, NP .  polyethylene glycol (MIRALAX / GLYCOLAX) packet 17 g, 17 g, Oral, Daily, Roberto ScalesNettey, Shayla D, MD, 17 g at 11/03/16 1008 .  senna-docusate (Senokot-S) tablet 2 tablet, 2 tablet, Oral, BID, Roberto ScalesNettey, Shayla D, MD, 2 tablet at 11/03/16 1008 .  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 None of the above  Prior Functional Level Prior Function Level of Independence: Independent Comments: Pt reports she was independent without AD and able to complete all ADLs. Very active with Church and working out regularly.  Self Care: Did the patient need help bathing, dressing, using the toilet or eating?  Independent  Indoor Mobility: Did the  patient need assistance with walking from room to room (with or without device)? Independent  Stairs: Did the patient need assistance with internal or external stairs (with or without device)? Independent  Functional Cognition: Did the patient need help planning regular tasks such as shopping or remembering to take medications? Independent  Current Functional Level Cognition  Overall Cognitive Status: Within Functional Limits for tasks assessed Orientation Level: Oriented X4 General Comments: Appears intact. will further assess    Extremity Assessment (includes Sensation/Coordination)  Upper Extremity Assessment: Overall WFL for tasks assessed  Lower Extremity Assessment: Defer to PT evaluation RLE Deficits / Details: R DF 4-/5; sensation intact to LT; heel to shin negative LLE Deficits / Details: gross strength 5/5; sensation intact to LT; heel to shin negative    ADLs  Overall ADL's : Needs assistance/impaired Eating/Feeding: NPO (NG tube) Grooming: Standing, Minimal assistance Grooming Details (indicate cue type and reason): A for balance. Unsteady when turning head Upper Body Bathing: Set up, Supervision/  safety, Sitting Lower Body Bathing: Minimal assistance, Sit to/from stand Upper Body Dressing : Set up, Supervision/safety, Sitting Lower Body Dressing: Minimal assistance, Sit to/from stand Toilet Transfer: Minimal assistance, Ambulation Toileting- Clothing Manipulation and Hygiene: Minimal assistance, Sit to/from stand Functional mobility during ADLs: Moderate assistance General ADL Comments: when mobilizing, pt required Mod A at times to regain balance when head turns are involved.     Mobility  Overal bed mobility: Modified Independent Bed Mobility: Supine to Sit Supine to sit: Modified independent (Device/Increase time) General bed mobility comments: Pt OOB in recliner upon arrival    Transfers  Overall transfer level: Needs assistance Equipment used:  None Transfers: Sit to/from Stand Sit to Stand: Min assist General transfer comment: Min guard to steady in standing    Ambulation / Gait / Stairs / Wheelchair Mobility  Ambulation/Gait Ambulation/Gait assistance: Min guard, Min assist Ambulation Distance (Feet): 15 Feet (to the hallway, followed by 6 bouts of 10 feet) Assistive device: None Gait Pattern/deviations: Step-through pattern, Decreased step length - right, Decreased step length - left, Decreased stride length, Decreased dorsiflexion - right, Drifts right/left, Narrow base of support General Gait Details: patient continues to demonstrate significant instability during ambulation without AD. Mulitmodal cues for pacing and focus on taking 10 quality steps at one time. with cues step length and cadence seem to improve but quickly loses balance after about 5 steps. She nees min A to correct for LOB and also reaches for other external support in the hallway. Gait velocity: decreased Gait velocity interpretation: <1.8 ft/sec, indicative of risk for recurrent falls    Posture / Balance Dynamic Sitting Balance Sitting balance - Comments: able to lean forward and adjust socks, return to midline without difficulty Static Standing Balance Rhomberg - Eyes Opened: 1 Rhomberg - Eyes Closed:  (incr sway needing min guard for safety) Balance Overall balance assessment: Needs assistance Sitting-balance support: Feet supported, No upper extremity supported Sitting balance-Leahy Scale: Good Sitting balance - Comments: able to lean forward and adjust socks, return to midline without difficulty Standing balance support: During functional activity, No upper extremity supported Standing balance-Leahy Scale: Poor Standing balance comment: Pt requires physical assist when involving head turns Rhomberg - Eyes Opened: 1 Rhomberg - Eyes Closed:  (incr sway needing min guard for safety) High level balance activites: Head turns, Turns, Direction  changes High Level Balance Comments: pt demonstrates instability with these activities, needing min A to correct for LOBs. Turns are slow and require several small steps to maintain stability    Special needs/care consideration BiPAP/CPAP  N/a CPM  N/a Continuous Drip IV  N/a Dialysis n/a Life Vest  N/a Oxygen  N/a Special Bed  N/a Trach Size  N/a Wound Vac n/a Skin intact Bowel mgmt: continent LBM 11/03/16; abdominal xray with moderate stool burden, bowel regimen initiated Bladder mgmt: continent Diabetic mgmt continent 30 day event monitor recommended at discharge as an outpt   Previous Home Environment Living Arrangements: Alone  Lives With: Alone Available Help at Discharge: Family, Available 24 hours/day (son, [name], disabled and can do 24/7 supervision) Type of Home:  (one level townhome) Home Layout: One level Home Access: Level entry Bathroom Shower/Tub: Tub/shower unit, Health visitor: Standard Bathroom Accessibility: Yes How Accessible: Accessible via walker Home Care Services: No  Discharge Living Setting Plans for Discharge Living Setting: Lives with (comment) (to go stay with son ,[name]) Type of Home at Discharge: House Discharge Home Layout: One level Discharge Home Access: Stairs to enter Discharge Bathroom  Shower/Tub: Tub/shower unit, Careers adviser: Standard Discharge Bathroom Accessibility: Yes How Accessible: Accessible via walker Does the patient have any problems obtaining your medications?: No    3 steps entry at her son's home  Social/Family/Support Systems Patient Roles: Parent Contact Information: [name], son Anticipated Caregiver: son  Anticipated Caregiver's Contact Information: see above Ability/Limitations of Caregiver: Naoko Diperna [name] is daisabled from numerous back surgreries Caregiver Availability: 24/7 Discharge Plan Discussed with Primary Caregiver: Yes Is Caregiver In Agreement with Plan?: Yes Does  Caregiver/Family have Issues with Lodging/Transportation while Pt is in Rehab?: No  Goals/Additional Needs Patient/Family Goal for Rehab: Mod I to supervision with PT, OT. and SLP Expected length of stay: [facility] 6- 10 days Equipment Needs: cortrak Pt/Family Agrees to Admission and willing to participate: Yes Program Orientation Provided & Reviewed with Pt/Caregiver Including Roles  & Responsibilities: Yes  Decrease burden of Care through IP rehab admission: n/a  Possible need for SNF placement upon discharge:not anticipated  Patient Condition: This patient's condition remains as documented in the consult dated 11/03/2016, in which the Rehabilitation Physician determined and documented that the patient's condition is appropriate for intensive rehabilitative care in an inpatient rehabilitation facility. Will admit to inpatient rehab today.  Preadmission Screen Completed By:  Clois Dupes, 11/03/2016 3:52 PM ______________________________________________________________________   Discussed status with Dr. Riley Kill on 11/03/16 at  1551 and received telephone approval for admission today.  Admission Coordinator:  Clois Dupes, time 9147 Date 11/03/2016

## 2016-11-03 NOTE — Discharge Summary (Addendum)
Discharge Summary  [name]:[id number] DOB: [birth year]  PCP: Patient, No Pcp Per  Admit date: 10/28/2016 Discharge date: 11/03/2016  Time spent: 25 minutes  Discharge to [facility]:  1. Continue aspirin and Plavix for 3 months, followed by Plavix alone per neuro recs 2. Start statin once able to swallow  Discharge Diagnoses:  Active Hospital Problems   Diagnosis Date Noted  . Acute ischemic stroke (HCC)   . Benign essential HTN   . Dysarthria, post-stroke   . Leukocytosis   . Acute blood loss anemia   . 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 Acute encephalopathy 10/28/2016    Resolved Hospital Problems   Diagnosis Date Noted Date Resolved  No resolved problems to display.    Discharge Condition: fair  Diet recommendation: tube feeds via cortrak, speech therapy will continue to assess   Vitals:   11/03/16 1335 11/03/16 1634  BP: 139/66 (!) 143/75  Pulse: 88 89  Resp: 16 20  Temp: 98.7 F (37.1 C) 98.5 F (36.9 C)  SpO2: 95% 99%    History of present illness:  [name] is a 76 year old female with medical history significant for hypertension who presented to [facility] 110/17/18 with a syncopal episode followed by increased confusion and disorientation and was found to have completed multifocal bilateral cerebellar strokes and left lateral medullary ischemic infarction.  Hospital Course:  Active Problems:   Acute encephalopathy   Cerebral thrombosis with cerebral infarction   Acute ischemic stroke (HCC)   Benign essential HTN   Dysarthria, post-stroke   Leukocytosis   Acute blood loss anemia   Acute encephalopathy, related to acute ischemic stroke Acute hypoxic respiratory failure, in setting of syncope and acute strokes Multifocal bilateral cerebellar strokes, left lateral medullary ischemic infarction Patient presented to the ED via EMS after a [place] syncopal spell at [place] which is followed by gradually worsening  confusion and agitation.  In the ED was called for acute confusion.  Physical exam at that time was notable for no facial droop or limb weakness.  CT head showed findings of chronic microvascular disease.  CTA head and neck showed age-indeterminate occlusion of the left vertebral artery.  EKG showed sinus rhythm.  Lab work was notable for potassium of 2.6, glucose 164, calcium 9.6, creatinine 0.8.  Patient was intubated in the ED for worsening mental status and concern for inability to protect airway.  No acute findings on CT scan for stroke physical exam not representative of such initial plan to treat empirically with antibiotics since LP was not deemed safe (hardware in place for recent lumbar spine surgery).  However MRI was obtained which showed several small areas of acute to subacute infarctions in the cerebellar hemispheres.  Given the complete nature of the infarction it was expressed to the family that endovascular treatment risk outweigh the potential benefit.  TPA and IV heparin were not started given concern for possible hemorrhagic conversion.  Patient was continued on aspirin and Plavix.  With plan to continue for 3 months followed by Plavix alone per neurology recommendations.  Moderate to severe pharyngeal cervical esophageal dysphagia Likely secondary effect of recent CVA.  Nutrition obtain by NG tube.  Patient continue to work with speech therapy throughout hospital course.  Hypertension Neurology recommended allowing permissive hypertension in setting of recent stroke  Hyperlipidemia LDL 134 Neurology recommend statin once able to swallow   Procedures:  TTE on October 17:EF 55-60%.  Wall motion normal.  Grade 1 diastolic dysfunction.  Pulmonary artery systolic pressure mildly increased (PA peak pressure 34 mmHg)  Consultations:  Neurology  Discharge Exam: BP (!) 143/75 (BP Location: Left Arm)   Pulse 89   Temp 98.5 F (36.9 C) (Oral)   Resp 20   Ht 5' (1.524 m)    Wt 62.7 kg (138 lb 3.2 oz)   SpO2 99%   BMI 26.99 kg/m    General: Sitting up in bed comfortably, in no obvious distress, pleasant in conversation Cardiovascular: Regular rate and rhythm, no appreciable JVD, no peripheral edema, no murmurs Respiratory: Lungs clear to auscultation bilaterally, room air  Discharge Instructions You were cared for by a hospitalist during your hospital stay. If you have any questions about your discharge medications or the care you received while you were in the hospital after you are discharged, you can call the unit and asked to speak with the hospitalist on call if the hospitalist that took care of you is not available. Once you are discharged, your primary care physician will handle any further medical issues. Please note that NO REFILLS for any discharge medications will be authorized once you are discharged, as it is imperative that you return to your primary care physician (or establish a relationship with a primary care physician if you do not have one) for your aftercare needs so that they can reassess your need for medications and monitor your lab values.  Discharge Instructions    Ambulatory referral to Neurology    Complete by:  As directed    Pt will follow up with Dr. Pearlean Brownie at [facility] in about 2 months. Thanks.   Diet - low sodium heart healthy    Complete by:  As directed    Increase activity slowly    Complete by:  As directed      Allergies as of 11/03/2016      Reactions   Lisinopril Other (See Comments)   Dizziness and felt odd   Lisinopril    Dizziness per merged chart       Medication List    TAKE these medications   acetaminophen 325 MG tablet Commonly known as:  TYLENOL Take 2 tablets (650 mg total) by mouth every 6 (six) hours as needed for moderate pain.   aspirin 300 MG suppository Place 1 suppository (300 mg total) rectally daily.   atorvastatin 40 MG tablet Commonly known as:  LIPITOR Take 1 tablet (40 mg total) by mouth  daily at 6 PM.   clopidogrel 75 MG tablet Commonly known as:  PLAVIX Take 1 tablet (75 mg total) by mouth daily.   feeding supplement (JEVITY 1.2 CAL) Liqd Place 1,000 mLs into feeding tube continuous.   free water Soln Place 200 mLs into feeding tube every 8 (eight) hours.   heparin 5000 UNIT/ML injection Inject 1 mL (5,000 Units total) into the skin every 8 (eight) hours.   insulin aspart 100 UNIT/ML injection Commonly known as:  novoLOG Inject 0-15 Units into the skin every 4 (four) hours.   labetalol 5 MG/ML injection Commonly known as:  NORMODYNE,TRANDATE Inject 2-4 mLs (10-20 mg total) into the vein every 2 (two) hours as needed (Use only if SBP > 180).   losartan-hydrochlorothiazide 100-12.5 MG tablet Commonly known as:  HYZAAR HOLD until BP can tolerate What changed:  how much to take  how to take this  when to take this  additional instructions   mouth rinse Liqd solution 15 mLs by Mouth Rinse route 2 (two) times daily.  polyethylene glycol packet Commonly known as:  MIRALAX / GLYCOLAX Take 17 g by mouth daily.   senna-docusate 8.6-50 MG tablet Commonly known as:  Senokot-S Take 2 tablets by mouth 2 (two) times daily.   sertraline 50 MG tablet Commonly known as:  ZOLOFT Take 50 mg by mouth daily.   traMADol 50 MG tablet Commonly known as:  ULTRAM HOLD until seen by PCP What changed:  how much to take  how to take this  when to take this  reasons to take this  additional instructions   white petrolatum Oint Commonly known as:  VASELINE Apply 1 application topically as needed for lip care.      Allergies  Allergen Reactions  . Lisinopril Other (See Comments)    Dizziness and felt odd  . Lisinopril     Dizziness per merged chart    Follow-up Information    Micki Riley, MD. Schedule an appointment as soon as possible for a visit in 6 week(s).   Specialties:  Neurology, Radiology Contact information: [address] [place] [address] [phone]            The results of significant diagnostics from this hospitalization (including imaging, microbiology, ancillary and laboratory) are listed below for reference.    Significant Diagnostic Studies: Ct Angio Head W Or Wo Contrast  Result Date: 10/28/2016 CLINICAL DATA:  Altered mental status.  Possible nystagmus. EXAM: CT ANGIOGRAPHY HEAD AND NECK TECHNIQUE: Multidetector CT imaging of the head and neck was performed using the standard protocol during bolus administration of intravenous contrast. Multiplanar CT image reconstructions and MIPs were obtained to evaluate the vascular anatomy. Carotid stenosis measurements (when applicable) are obtained utilizing NASCET criteria, using the distal internal carotid diameter as the denominator. CONTRAST:  50 mL Isovue 370 COMPARISON:  Head CT earlier the same day FINDINGS: CTA NECK FINDINGS Aortic arch: There is mild calcific atherosclerosis of the aortic arch. There is no aneurysm, dissection or hemodynamically significant stenosis of the visualized ascending aorta and aortic arch. Conventional 3 vessel aortic branching pattern. The visualized proximal subclavian arteries are widely patent. Right carotid system: The right common carotid origin is widely patent. There is no common carotid or internal carotid artery dissection or aneurysm. No hemodynamically significant stenosis. Left carotid system: The left common carotid origin is widely patent. There is no common carotid or internal carotid artery dissection or aneurysm. No hemodynamically significant stenosis. Vertebral arteries: The vertebral system is right dominant. Both vertebral artery origins are patent. 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 There is no distal opacification. The entire right vertebral artery is normal. Skeleton: There is no bony spinal canal stenosis. No lytic or blastic lesions. Other neck: The nasopharynx is clear.  The oropharynx and hypopharynx are normal. The epiglottis is normal. The supraglottic larynx, glottis and subglottic larynx are normal. No retropharyngeal collection. The parapharyngeal spaces are preserved. The parotid and submandibular glands are normal. No sialolithiasis or salivary ductal dilatation. The thyroid gland is normal. There is no cervical lymphadenopathy. Upper chest: Biapical septal thickening, possibly mild pulmonary edema. Review of the MIP images confirms the above findings CTA HEAD FINDINGS Anterior circulation: --Intracranial internal carotid arteries: Mild atherosclerotic calcification of the internal carotid arteries at the skullbase without significant stenosis. --Anterior cerebral arteries: Normal. --Middle cerebral arteries: Normal. --Posterior communicating arteries: Present on the left, absent on the right. Posterior circulation: --Posterior cerebral arteries: Normal. --Superior cerebellar arteries: Normal. --Basilar artery: Normal. --Anterior inferior  cerebellar arteries: Normal. --Posterior inferior cerebellar arteries: Normal right PICA. Slight opacification of left PICA. Venous sinuses: As permitted by contrast timing, patent. Anatomic variants: None Delayed phase: Not performed. Review of the MIP images confirms the above findings IMPRESSION: 1. 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 Otherwise, no large vessel occlusion or high-grade stenosis. 2. Attenuated opacification of the left PICA. Otherwise, the intracranial posterior circulation is normal. 3.  Aortic Atherosclerosis (ICD10-I70.0). These results were called by telephone at the time of interpretation on 10/28/2016 at 8:36 pm to Dr. Caryl PinaERIC LINDZEN , who verbally acknowledged these results. Electronically Signed   By: Deatra RobinsonKevin  Herman M.D.   On: 10/28/2016 20:44   Ct Head Wo Contrast  Result Date: 10/29/2016 CLINICAL DATA:  Followup stroke. EXAM: CT HEAD WITHOUT CONTRAST TECHNIQUE:  Contiguous axial images were obtained from the base of the skull through the vertex without intravenous contrast. COMPARISON:  CT HEAD October 28, 2016 and MRI of the head October 29, 2016 at 0006 hours FINDINGS: BRAIN: Multiple wedge-like hypodensities LEFT cerebellum corresponding to known infarcts similar in distribution given differences in imaging technique. Acute small RIGHT cerebellar infarcts. No intraparenchymal hemorrhage, mass effect nor midline shift. Narrowed though patent fourth ventricle. The ventricles and sulci are normal for age. No acute large vascular territory infarcts. No abnormal extra-axial fluid collections. Basal cisterns are patent. VASCULAR: Moderate calcific atherosclerosis of the carotid siphons. SKULL: No skull fracture. No significant scalp soft tissue swelling. SINUSES/ORBITS: The mastoid air-cells and included paranasal sinuses are well-aerated.The included ocular globes and orbital contents are non-suspicious. OTHER: None. IMPRESSION: 1. Evolving acute cerebellar infarcts.  No hemorrhagic conversion. 2. Mild mass effect on fourth ventricle.  No hydrocephalus. Electronically Signed   By: Awilda Metroourtnay  Bloomer M.D.   On: 10/29/2016 18:24   Ct Angio Neck W And/or Wo Contrast  Result Date: 10/28/2016 CLINICAL DATA:  Altered mental status.  Possible nystagmus. EXAM: CT ANGIOGRAPHY HEAD AND NECK TECHNIQUE: Multidetector CT imaging of the head and neck was performed using the standard protocol during bolus administration of intravenous contrast. Multiplanar CT image reconstructions and MIPs were obtained to evaluate the vascular anatomy. Carotid stenosis measurements (when applicable) are obtained utilizing NASCET criteria, using the distal internal carotid diameter as the denominator. CONTRAST:  50 mL Isovue 370 COMPARISON:  Head CT earlier the same day FINDINGS: CTA NECK FINDINGS Aortic arch: There is mild calcific atherosclerosis of the aortic arch. There is no aneurysm, dissection or  hemodynamically significant stenosis of the visualized ascending aorta and aortic arch. Conventional 3 vessel aortic branching pattern. The visualized proximal subclavian arteries are widely patent. Right carotid system: The right common carotid origin is widely patent. There is no common carotid or internal carotid artery dissection or aneurysm. No hemodynamically significant stenosis. Left carotid system: The left common carotid origin is widely patent. There is no common carotid or internal carotid artery dissection or aneurysm. No hemodynamically significant stenosis. Vertebral arteries: The vertebral system is right dominant. Both vertebral artery origins are patent. 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 There is no distal opacification. The entire right vertebral artery is normal. Skeleton: There is no bony spinal canal stenosis. No lytic or blastic lesions. Other neck: The nasopharynx is clear. The oropharynx and hypopharynx are normal. The epiglottis is normal. The supraglottic larynx, glottis and subglottic larynx are normal. No retropharyngeal collection. The parapharyngeal spaces are preserved. The parotid and submandibular glands are normal. No sialolithiasis or salivary ductal  dilatation. The thyroid gland is normal. There is no cervical lymphadenopathy. Upper chest: Biapical septal thickening, possibly mild pulmonary edema. Review of the MIP images confirms the above findings CTA HEAD FINDINGS Anterior circulation: --Intracranial internal carotid arteries: Mild atherosclerotic calcification of the internal carotid arteries at the skullbase without significant stenosis. --Anterior cerebral arteries: Normal. --Middle cerebral arteries: Normal. --Posterior communicating arteries: Present on the left, absent on the right. Posterior circulation: --Posterior cerebral arteries: Normal. --Superior cerebellar arteries: Normal. --Basilar artery: Normal. --Anterior inferior cerebellar  arteries: Normal. --Posterior inferior cerebellar arteries: Normal right PICA. Slight opacification of left PICA. Venous sinuses: As permitted by contrast timing, patent. Anatomic variants: None Delayed phase: Not performed. Review of the MIP images confirms the above findings IMPRESSION: 1. 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 Otherwise, no large vessel occlusion or high-grade stenosis. 2. Attenuated opacification of the left PICA. Otherwise, the intracranial posterior circulation is normal. 3.  Aortic Atherosclerosis (ICD10-I70.0). These results were called by telephone at the time of interpretation on 10/28/2016 at 8:36 pm to Dr. Caryl Pina , who verbally acknowledged these results. Electronically Signed   By: Deatra Robinson M.D.   On: 10/28/2016 20:44   Mr Brain Wo Contrast  Result Date: 10/29/2016 CLINICAL DATA:  76 y/o  F; stroke. EXAM: MRI HEAD WITHOUT CONTRAST TECHNIQUE: Multiplanar, multiecho pulse sequences of the brain and surrounding structures were obtained without intravenous contrast. COMPARISON:  10/28/2016 CT angiogram of head and neck. FINDINGS: Brain: Reduced diffusion within the left aspect of medulla and throughout the left greater than right mid to inferior cerebellar hemispheres and vermis compatible with acute/ early subacute infarction. No abnormal susceptibility hypointensity to indicate hemorrhage. No significant mass effect. No hydrocephalus, extra-axial collection, effacement of basilar cisterns, or focal mass effect of the brain. Mild last T2 FLAIR hyperintense signal abnormality in periventricular white matter likely represents chronic microvascular ischemic changes. Mild brain parenchymal volume loss. Vascular: Abnormal flow void within the left intracranial vertebral artery corresponding to occlusion on prior CT angiogram. Skull and upper cervical spine: Normal marrow signal. Sinuses/Orbits: Negative. Other: Fluid within the  nasopharynx. IMPRESSION: 1. Several small areas of acute/early subacute infarction involving left medulla and scattered throughout the left-greater-than-right mid to inferior cerebellar hemispheres as well as vermis. No acute hemorrhage or significant mass effect. 2. 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 3. Mild chronic microvascular ischemic changes and mild parenchymal volume loss of the brain. These results were called by telephone at the time of interpretation on 10/29/2016 at 1:01 am to Dr. Caryl Pina , who verbally acknowledged these results. Electronically Signed   By: Mitzi Hansen M.D.   On: 10/29/2016 01:09   Dg Chest Port 1 View  Result Date: 10/29/2016 CLINICAL DATA:  Respiratory failure short of breath EXAM: PORTABLE CHEST 1 VIEW COMPARISON:  10/28/2016 FINDINGS: Endotracheal tube remains in good position. Gastric tube in the stomach. Left lower lobe airspace disease is improved. Minimal airspace disease in the right lower lobe. Negative for heart failure and edema or effusion IMPRESSION: Endotracheal tube remains in good position. Improved aeration left lung base. Electronically Signed   By: Marlan Palau M.D.   On: 10/29/2016 06:57   Dg Chest Portable 1 View  Result Date: 10/28/2016 CLINICAL DATA:  Endotracheal tube adjustment EXAM: PORTABLE CHEST 1 VIEW COMPARISON:  Chest radiograph 10/28/2016 at 9:01 p.m. FINDINGS: Endotracheal tube has been retracted and now ends 2 cm above the carina. The left lung  is now aerated. Persistent atelectasis at the left lung base. The endotracheal tube could be retracted another 2 cm for optimal positioning. Orogastric tube side port overlies the stomach. IMPRESSION: Endotracheal tube tip 2 cm above the carina with aeration of the left lung. The tube could be retracted another 2 cm for optimal positioning. Electronically Signed   By: Deatra Robinson M.D.   On: 10/28/2016 21:33   Dg Chest  Portable 1 View  Result Date: 10/28/2016 CLINICAL DATA:  Endotracheal and orogastric tube placement EXAM: PORTABLE CHEST 1 VIEW COMPARISON:  None. FINDINGS: There is whiteout of the left lung limiting assessment. An endotracheal tube tip appears project within the proximal right mainstem bronchus and withdrawal approximately 4.3 cm is suggested. Right lung remains clear. The patient is slightly tilted to the left and rotated on current exam. A gastric tube is seen coiled in the expected location of the stomach. The tip is excluded. IMPRESSION: 1. The tip of an endotracheal tube appears to be in the proximal right mainstem bronchus. Pullback of the tube approximately 4.3 cm suggested. 2. Complete whiteout of the left hemithorax. 3. Gastric tube extends into the expected location the stomach. Electronically Signed   By: Tollie Eth M.D.   On: 10/28/2016 21:32   Dg Abd 2 Views  Result Date: 11/02/2016 CLINICAL DATA:  Acute onset of left lower quadrant abdominal pain. Initial encounter. EXAM: ABDOMEN - 2 VIEW COMPARISON:  Abdominal radiograph performed 10/28/2016, and chest radiograph performed 10/29/2016 FINDINGS: The lungs are well-aerated and clear. There is no evidence of focal opacification, pleural effusion or pneumothorax. The lung apices are incompletely imaged on this study. The cardiomediastinal silhouette is within normal limits. The visualized bowel gas pattern is unremarkable. Scattered stool and air are seen within the colon; there is no evidence of small bowel dilatation to suggest obstruction. No free intra-abdominal air is identified on the provided upright view. The patient's enteric tube is noted ending overlying the third segment of the duodenum. Clips are noted within the right upper quadrant, reflecting prior cholecystectomy. No acute osseous abnormalities are seen; the sacroiliac joints are unremarkable in appearance. Lumbar spinal fusion hardware is noted. IMPRESSION: 1. Unremarkable  bowel gas pattern; no free intra-abdominal air seen. Moderate amount of stool noted in the colon. 2. Enteric tube noted ending overlying the third segment of the duodenum. 3. No acute cardiopulmonary process seen. Electronically Signed   By: Roanna Raider M.D.   On: 11/02/2016 06:41   Dg Abd Portable 1 View  Result Date: 10/28/2016 CLINICAL DATA:  Gastric tube placement. EXAM: PORTABLE ABDOMEN - 1 VIEW COMPARISON:  None. FINDINGS: The tip of a gastric tube is seen projecting in the left lower quadrant of the abdomen with side-port in the left mid abdomen. The patient may have a J-shaped stomach accounting for this appearance, an anatomic variant. No free air. Posterior lumbar fusion hardware from L2 through L5. Surgical clips are seen in the right upper quadrant. IMPRESSION: 1. The tip and side port of a gastric tube are seen in the left hemiabdomen. 2. L2 through L5 posterior lumbar fusion. Electronically Signed   By: Tollie Eth M.D.   On: 10/28/2016 21:36   Dg Swallowing Func-speech Pathology  Result Date: 10/30/2016 Please refer to "Notes" tab for Speech Pathology notes.  Ct Head Code Stroke Wo Contrast  Result Date: 10/28/2016 CLINICAL DATA:  Code stroke.  Altered mental status EXAM: CT HEAD WITHOUT CONTRAST TECHNIQUE: Contiguous axial images were obtained from the base of  the skull through the vertex without intravenous contrast. COMPARISON:  None. FINDINGS: Brain: No mass lesion or acute hemorrhage. No focal hypoattenuation of the basal ganglia or cortex to indicate infarcted tissue. No hydrocephalus or age advanced atrophy. There is periventricular hypoattenuation compatible with chronic microvascular disease. Vascular: No hyperdense vessel. No advanced atherosclerotic calcification of the arteries at the skull base. Skull: Normal visualized skull base, calvarium and extracranial soft tissues. Sinuses/Orbits: No sinus fluid levels or advanced mucosal thickening. No mastoid effusion. Normal  orbits. ASPECTS [facility] Stroke Program Early CT Score) - Ganglionic level infarction (caudate, lentiform nuclei, internal capsule, insula, M1-M3 cortex): 7 - Supraganglionic infarction (M4-M6 cortex): 3 Total score (0-10 with 10 being normal): 10 IMPRESSION: 1. No acute hemorrhage or mass lesion. 2. Findings of chronic microvascular disease. 3. ASPECTS is 10. Dr. Caryl Pina was paged at 7:58 p.m. on 10/28/2016. Electronically Signed   By: Deatra Robinson M.D.   On: 10/28/2016 19:59    Microbiology: Recent Results (from the past 240 hour(s))  Culture, blood (Routine X 2) w Reflex to ID Panel     Status: None   Collection Time: 10/29/16 12:57 AM  Result Value Ref Range Status   Specimen Description BLOOD RIGHT ANTECUBITAL  Final   Special Requests   Final    BOTTLES DRAWN AEROBIC AND ANAEROBIC Blood Culture adequate volume   Culture NO GROWTH 5 DAYS  Final   Report Status 11/03/2016 FINAL  Final  Culture, blood (Routine X 2) w Reflex to ID Panel     Status: None   Collection Time: 10/29/16  1:05 AM  Result Value Ref Range Status   Specimen Description BLOOD RIGHT HAND  Final   Special Requests   Final    BOTTLES DRAWN AEROBIC ONLY Blood Culture adequate volume   Culture NO GROWTH 5 DAYS  Final   Report Status 11/03/2016 FINAL  Final  MRSA PCR Screening     Status: None   Collection Time: 10/29/16  2:11 AM  Result Value Ref Range Status   MRSA by PCR NEGATIVE NEGATIVE Final    Comment:        The GeneXpert MRSA Assay (FDA approved for NASAL specimens only), is one component of a comprehensive MRSA colonization surveillance program. It is not intended to diagnose MRSA infection nor to guide or monitor treatment for MRSA infections.      Labs: Basic Metabolic Panel:  Recent Labs Lab 10/28/16 1930 10/28/16 2003 10/29/16 0110 10/30/16 0420 11/03/16 1856  NA 135 141 134* 141  --   K 2.6* 2.6* 3.3* 3.8  --   CL 101 106 97* 107  --   CO2 18*  --  25 26  --   GLUCOSE 166*  164* 164* 102*  --   BUN 24* 23* 23* 20  --   CREATININE 0.94 0.80 0.86 0.75 0.68  CALCIUM 9.6  --  9.2 8.8*  --   MG  --   --  1.9  --   --   PHOS  --   --  4.4  --   --    Liver Function Tests:  Recent Labs Lab 10/28/16 1930  AST 32  ALT 21  ALKPHOS 116  BILITOT 0.8  PROT 7.8  ALBUMIN 4.4   No results for input(s): LIPASE, AMYLASE in the last 168 hours. No results for input(s): AMMONIA in the last 168 hours. CBC:  Recent Labs Lab 10/28/16 1930 10/28/16 2003 10/29/16 0110 10/30/16 0420 11/03/16 1856  WBC 12.3*  --  15.6* 11.0* 12.8*  NEUTROABS 6.0  --   --   --   --   HGB 12.7 11.2* 12.9 10.9* 10.6*  HCT 36.7 33.0* 38.1 33.5* 32.3*  MCV 87.0  --  89.0 91.5 89.0  PLT 344  --  297 249 268   Cardiac Enzymes: No results for input(s): CKTOTAL, CKMB, CKMBINDEX, TROPONINI in the last 168 hours. BNP: BNP (last 3 results) No results for input(s): BNP in the last 8760 hours.  ProBNP (last 3 results) No results for input(s): PROBNP in the last 8760 hours.  CBG:  Recent Labs Lab 11/03/16 0339 11/03/16 0935 11/03/16 1240 11/03/16 1630 11/03/16 2009  GLUCAP 136* 153* 123* 114* 103*       Signed:  Laverna Peace, MD [facility] 11/03/2016, 10:34 PM

## 2016-11-03 NOTE — Progress Notes (Signed)
I met with pt at bedside to begin discussions for an inpt rehab admission to include goals and expectations. She is in agreement. She plans to go home with her son, Corene Cornea, who can provide 24/7 supervision. I will begin insurance authorization for a possible inpt rehab admission. [phone]

## 2016-11-03 NOTE — H&P (Deleted)
  The note originally documented on this encounter has been moved the the encounter in which it belongs.  

## 2016-11-03 NOTE — Progress Notes (Signed)
Patient admitted at 1735 to [phone]. Patient oriented to unit and rehab process/fall risk prevention. Patient alert and oriented x4. Patient's vitals stable and all questions answered. Call bell left within reach.

## 2016-11-03 NOTE — Care Management Note (Signed)
Case Management Note  Patient Details  Name: [name] MRN: [id number] Date of Birth: 02/02/1940  Subjective/Objective:                    Action/Plan: Pt discharging to CIR. No further needs per CM.   Expected Discharge Date:  11/03/16               Expected Discharge Plan:  IP Rehab Facility  In-House Referral:     Discharge planning Services  CM Consult  Post Acute Care Choice:    Choice offered to:     DME Arranged:    DME Agency:     HH Arranged:    HH Agency:     Status of Service:  Completed, signed off  If discussed at [employer] Meetings, dates discussed:    Additional Comments:  Kermit BaloKelli F Lovie Zarling, RN 11/03/2016, 4:23 PM

## 2016-11-03 NOTE — Progress Notes (Signed)
  Speech Language Pathology Treatment: Dysphagia  Patient Details Name: [name] MRN: [id number] DOB: 05/20/1940 Today's Date: 11/03/2016 Time: [id number] SLP Time Calculation (min) (ACUTE ONLY): 8 min  Assessment / Plan / Recommendation Clinical Impression  SLP tx focused on dysphagia management and review of exercises targeting hyolaryngeal excursion (CTAR/RMST). Per pt report, she had already completed 30 repetitions of each exercise this morning. SLP attempted Shaker exercise which pt complains causes neck pain; advised pt to only use CTAR exercise if Shaker is causing discomfort. When instructed to demonstrate the exercises to ensure accuracy, pt demonstrated ability to correctly complete 10 repetitions of both the CTAR and RMST and vocalized repetition recommendations accurately (30x 3x/day). Pt continues having difficulty managing secretions and reports no feeling of improvement; reiterated with pt that improvement after only a few days is not likely but SLP will continue to follow and complete MBS prior to d/c. Pt verbalized understanding and agreement with recommendations.    HPI HPI: Pt is a 76 y.o.femalewith PMH of HTN admitted 10/17 after syncopal episode at church. 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 She was intubated 10/17-10/18.      SLP Plan  Continue with current plan of care       Recommendations  Diet recommendations: NPO                Oral Care Recommendations: Oral care QID;Oral care prior to ice chip/H20 Follow up Recommendations: Inpatient Rehab SLP Visit Diagnosis: Dysphagia, pharyngoesophageal phase (R13.14) Plan: Continue with current plan of care       GO                Carmela RimaAmanda Shavette Shoaff, Student SLP 11/03/2016, 8:53 AM

## 2016-11-03 NOTE — Progress Notes (Signed)
I have insurance approval to admit pt to inpt rehab today. I contacted Dr. Caleb PoppNettey and will make the arrangements to admit today. RN CM and SW are aware. [phone]

## 2016-11-04 ENCOUNTER — Inpatient Hospital Stay (HOSPITAL_COMMUNITY): Payer: Medicare Other | Admitting: Physical Therapy

## 2016-11-04 ENCOUNTER — Inpatient Hospital Stay (HOSPITAL_COMMUNITY): Payer: Medicare Other | Admitting: Occupational Therapy

## 2016-11-04 ENCOUNTER — Inpatient Hospital Stay (HOSPITAL_COMMUNITY): Payer: Medicare Other | Admitting: Speech Pathology

## 2016-11-04 DIAGNOSIS — R269 Unspecified abnormalities of gait and mobility: Secondary | ICD-10-CM

## 2016-11-04 DIAGNOSIS — I69398 Other sequelae of cerebral infarction: Secondary | ICD-10-CM

## 2016-11-04 LAB — COMPREHENSIVE METABOLIC PANEL
ALT: 26 U/L (ref 14–54)
ANION GAP: 11 (ref 5–15)
AST: 27 U/L (ref 15–41)
Albumin: 3.5 g/dL (ref 3.5–5.0)
Alkaline Phosphatase: 102 U/L (ref 38–126)
BILIRUBIN TOTAL: 0.6 mg/dL (ref 0.3–1.2)
BUN: 25 mg/dL — AB (ref 6–20)
CHLORIDE: 100 mmol/L — AB (ref 101–111)
CO2: 33 mmol/L — ABNORMAL HIGH (ref 22–32)
Calcium: 9 mg/dL (ref 8.9–10.3)
Creatinine, Ser: 0.71 mg/dL (ref 0.44–1.00)
GFR calc Af Amer: >60 mL/min (ref >60)
Glucose, Bld: 111 mg/dL — ABNORMAL HIGH (ref 65–99)
POTASSIUM: 3.4 mmol/L — AB (ref 3.5–5.1)
Sodium: 144 mmol/L (ref 135–145)
TOTAL PROTEIN: 6.6 g/dL (ref 6.5–8.1)

## 2016-11-04 LAB — CBC WITH DIFFERENTIAL/PLATELET
BASOS ABS: 0 10*3/uL (ref 0.0–0.1)
BASOS PCT: 0 %
EOS ABS: 0.1 10*3/uL (ref 0.0–0.7)
Eosinophils Relative: 1 %
HCT: 32.7 % — ABNORMAL LOW (ref 36.0–46.0)
Hemoglobin: 10.5 g/dL — ABNORMAL LOW (ref 12.0–15.0)
Lymphocytes Relative: 12 %
Lymphs Abs: 1.6 10*3/uL (ref 0.7–4.0)
MCH: 29.2 pg (ref 26.0–34.0)
MCHC: 32.1 g/dL (ref 30.0–36.0)
MCV: 91.1 fL (ref 78.0–100.0)
MONO ABS: 1.3 10*3/uL — AB (ref 0.1–1.0)
MONOS PCT: 10 %
NEUTROS ABS: 9.8 10*3/uL — AB (ref 1.7–7.7)
Neutrophils Relative %: 77 %
PLATELETS: 275 10*3/uL (ref 150–400)
RBC: 3.59 MIL/uL — ABNORMAL LOW (ref 3.87–5.11)
RDW: 15.1 % (ref 11.5–15.5)
WBC: 12.7 10*3/uL — ABNORMAL HIGH (ref 4.0–10.5)

## 2016-11-04 LAB — GLUCOSE, CAPILLARY
GLUCOSE-CAPILLARY: 140 mg/dL — AB (ref 65–99)
GLUCOSE-CAPILLARY: 139 mg/dL — AB (ref 65–99)
Glucose-Capillary: 137 mg/dL — ABNORMAL HIGH (ref 65–99)
Glucose-Capillary: 135 mg/dL — ABNORMAL HIGH (ref 65–99)
Glucose-Capillary: 101 mg/dL — ABNORMAL HIGH (ref 65–99)

## 2016-11-04 MED ORDER — SERTRALINE HCL 50 MG PO TABS
50.0000 mg | ORAL_TABLET | Freq: Every day | ORAL | Status: DC
  Administered 2016-11-04 – 2016-11-19 (×15): 50 mg
  Filled 2016-11-04 (×16): qty 1

## 2016-11-04 MED ORDER — JEVITY 1.5 CAL/FIBER PO LIQD
1000.0000 mL | ORAL | Status: DC
  Administered 2016-11-04 – 2016-11-09 (×4): 1000 mL
  Filled 2016-11-04 (×10): qty 1000

## 2016-11-04 MED ORDER — JEVITY 1.2 CAL PO LIQD
1000.0000 mL | ORAL | Status: DC
  Filled 2016-11-04 (×2): qty 1000

## 2016-11-04 MED ORDER — ASPIRIN 325 MG PO TABS
325.0000 mg | ORAL_TABLET | Freq: Every day | ORAL | Status: DC
  Administered 2016-11-04 – 2016-11-19 (×15): 325 mg via ORAL
  Filled 2016-11-04 (×16): qty 1

## 2016-11-04 NOTE — H&P (Signed)
Physical Medicine and Rehabilitation Admission H&P       Chief Complaint  Patient presents with  . Altered Mental Status  . Code Stroke  : UJW:JXBJ Donna Reams Murrayis a 76 y.o.right handed femalewith history of hypertension. Per chart review patient lives alonein a [place] home with no steps to entry and independent prior to admission.She has a son who is disabled and can provide good support.Presented 10/28/2016 after syncopal episode after leaving church. When she walked out of church she became dizzy had an episode of vomiting followed by syncope. Cranial CT scan negative. 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 No large vessel occlusion or high-grade stenosis. MRI of the brain reviewed, showing multiple brainstem and cerebellar infarcts. Per report, several small areas of acute early subacute infarction involving left medulla and scattered throughout the left greater than right mid to inferior cerebellar hemispheres as well as vermis. No acute hemorrhage noted. Troponin negative. Hypokalemia 2.6 with supplement added. Echocardiogram with ejection fraction of 60% grade 1 diastolic dysfunction. Patient did initially require intubation for worsening mental status,somnolence,and inability to protect airway in addition to hypoxia. She was extubated 10/29/2016. Neurology consulted maintained on aspirin and Plavix therapy for CVA prophylaxis and recommendations a 30 day event monitor as outpatient. Subcutaneous heparin for DVT prophylaxis. Patient NPOand currently with a nasogastric tube for nutritional support andfollow-up speech therapy for dysphagia. Physical occupational therapy ongoing with recommendations of physical medicine rehabilitation consult. Patient was admitted for a comprehensive rehabilitation program   Review of Systems  Constitutional: Negative for chills and fever.  HENT: Negative for hearing  loss.   Eyes: Positive for blurred vision. Negative for double vision.  Respiratory: Negative for shortness of breath.   Cardiovascular: Negative for chest pain, palpitations and leg swelling.  Gastrointestinal: Positive for constipation. Negative for nausea and vomiting.  Genitourinary: Negative for dysuria, flank pain and hematuria.  Musculoskeletal: Positive for back pain.  Skin: Negative for rash.  Neurological: Positive for dizziness and focal weakness.  Psychiatric/Behavioral: Positive for depression.  All other systems reviewed and are negative.  No past medical history on file.      Past Surgical History:  Procedure Laterality Date  . BACK SURGERY     No family history on file. Social History:  reports that she has never smoked. She has never used smokeless tobacco. She reports that she does not drink alcohol or use drugs. Allergies:       Allergies  Allergen Reactions  . Lisinopril     Dizziness per merged chart          Medications Prior to Admission  Medication Sig Dispense Refill  . losartan-hydrochlorothiazide (HYZAAR) 100-12.5 MG tablet Take 1 tablet by mouth daily.    . sertraline (ZOLOFT) 50 MG tablet Take 50 mg by mouth daily.    . traMADol (ULTRAM) 50 MG tablet Take 50 mg by mouth every 6 (six) hours as needed for moderate pain.      Drug Regimen Review Drug regimen was reviewed and remains appropriate no significant issues identified  Home: Home Living Family/patient expects to be discharged to:: Inpatient rehab Living Arrangements: Alone Available Help at Discharge: Family, Friend(s), Available PRN/intermittently Type of Home: House Home Access: Level entry Home Layout: One level Bathroom Shower/Tub: Engineer, manufacturing systems: Standard Bathroom Accessibility: Yes Home Equipment: Shower seat   Functional History: Prior Function Level of Independence: Independent Comments: Pt reports she was independent  without AD and able  to complete all ADLs. Very active with Church and working out regularly.  Functional Status:  Mobility: Bed Mobility Overal bed mobility: Modified Independent Bed Mobility: Supine to Sit Supine to sit: Modified independent (Device/Increase time) General bed mobility comments: Pt OOB in recliner upon arrival Transfers Overall transfer level: Needs assistance Equipment used: None Transfers: Sit to/from Stand Sit to Stand: Min assist General transfer comment: Min guard to steady in standing Ambulation/Gait Ambulation/Gait assistance: Min guard, Min assist Ambulation Distance (Feet): 15 Feet (to the hallway, followed by 6 bouts of 10 feet) Assistive device: None Gait Pattern/deviations: Step-through pattern, Decreased step length - right, Decreased step length - left, Decreased stride length, Decreased dorsiflexion - right, Drifts right/left, Narrow base of support General Gait Details: patient continues to demonstrate significant instability during ambulation without AD. Mulitmodal cues for pacing and focus on taking 10 quality steps at one time. with cues step length and cadence seem to improve but quickly loses balance after about 5 steps. She nees min A to correct for LOB and also reaches for other external support in the hallway. Gait velocity: decreased Gait velocity interpretation: <1.8 ft/sec, indicative of risk for recurrent falls  ADL: ADL Overall ADL's : Needs assistance/impaired Eating/Feeding: NPO (NG tube) Grooming: Standing, Minimal assistance Grooming Details (indicate cue type and reason): A for balance. Unsteady when turning head Upper Body Bathing: Set up, Supervision/ safety, Sitting Lower Body Bathing: Minimal assistance, Sit to/from stand Upper Body Dressing : Set up, Supervision/safety, Sitting Lower Body Dressing: Minimal assistance, Sit to/from stand Toilet Transfer: Minimal assistance, Ambulation Toileting- Clothing Manipulation and Hygiene: Minimal  assistance, Sit to/from stand Functional mobility during ADLs: Moderate assistance General ADL Comments: when mobilizing, pt required Mod A at times to regain balance when head turns are involved.   Cognition: Cognition Overall Cognitive Status: Within Functional Limits for tasks assessed Orientation Level: Oriented X4 Cognition Arousal/Alertness: Awake/alert Behavior During Therapy: WFL for tasks assessed/performed Overall Cognitive Status: Within Functional Limits for tasks assessed General Comments: Appears intact. will further assess  Physical Exam: Blood pressure 131/86, pulse 98, temperature 98.4 F (36.9 C), temperature source Oral, resp. rate 20, height 5' (1.524 m), weight 62.7 kg (138 lb 3.2 oz), SpO2 96 %. Physical Exam  Vitals reviewed. HENT:  Cortrak tube in place  Eyes:  Pupils reactive to light  Neck: Normal range of motion. Neck supple. No thyromegaly present.  Cardiovascular: Normal rate, regular rhythm and normal heart sounds.  Exam reveals no friction rub.   No murmur heard. Respiratory: Effort normal and breath sounds normal. No respiratory distress.  GI: Soft. Bowel sounds are normal. She exhibits no distension. There is no tenderness. There is no rebound.  Skin. Warm and dry Musculoskeletal: She exhibits no edemaor tenderness.  Neurological: She is alertand oriented to person, place, and time. Motor: 4/5 all 4 limbs proximal to distal. Normal sensation to pain and LT No limb ataxia. No nystagmus. Speech generally clear. Fair insight and awareness  Lab Results Last 48 Hours       Results for orders placed or performed during the hospital encounter of 10/28/16 (from the past 48 hour(s))  Glucose, capillary     Status: Abnormal   Collection Time: 11/01/16 11:33 AM  Result Value Ref Range   Glucose-Capillary 126 (H) 65 - 99 mg/dL   Comment 1 Notify RN    Comment 2 Document in Chart   Glucose, capillary     Status: Abnormal   Collection Time:  11/01/16  3:56 PM  Result Value Ref Range   Glucose-Capillary 105 (H) 65 - 99 mg/dL   Comment 1 Notify RN    Comment 2 Document in Chart   Glucose, capillary     Status: Abnormal   Collection Time: 11/01/16  8:09 PM  Result Value Ref Range   Glucose-Capillary 104 (H) 65 - 99 mg/dL   Comment 1 Notify RN    Comment 2 Document in Chart   Glucose, capillary     Status: Abnormal   Collection Time: 11/02/16 12:07 AM  Result Value Ref Range   Glucose-Capillary 125 (H) 65 - 99 mg/dL   Comment 1 Notify RN    Comment 2 Document in Chart   Glucose, capillary     Status: Abnormal   Collection Time: 11/02/16  4:22 AM  Result Value Ref Range   Glucose-Capillary 125 (H) 65 - 99 mg/dL   Comment 1 Notify RN    Comment 2 Document in Chart   Glucose, capillary     Status: Abnormal   Collection Time: 11/02/16  7:44 AM  Result Value Ref Range   Glucose-Capillary 141 (H) 65 - 99 mg/dL  Glucose, capillary     Status: None   Collection Time: 11/02/16 11:22 AM  Result Value Ref Range   Glucose-Capillary 99 65 - 99 mg/dL  Glucose, capillary     Status: Abnormal   Collection Time: 11/02/16  4:56 PM  Result Value Ref Range   Glucose-Capillary 107 (H) 65 - 99 mg/dL  Glucose, capillary     Status: Abnormal   Collection Time: 11/02/16  8:13 PM  Result Value Ref Range   Glucose-Capillary 118 (H) 65 - 99 mg/dL   Comment 1 Notify RN    Comment 2 Document in Chart   Glucose, capillary     Status: Abnormal   Collection Time: 11/03/16 12:33 AM  Result Value Ref Range   Glucose-Capillary 103 (H) 65 - 99 mg/dL   Comment 1 Notify RN    Comment 2 Document in Chart   Glucose, capillary     Status: Abnormal   Collection Time: 11/03/16  3:39 AM  Result Value Ref Range   Glucose-Capillary 136 (H) 65 - 99 mg/dL   Comment 1 Notify RN    Comment 2 Document in Chart   Glucose, capillary     Status: Abnormal   Collection Time: 11/03/16  9:35 AM  Result Value Ref  Range   Glucose-Capillary 153 (H) 65 - 99 mg/dL      Imaging Results (Last 48 hours)  Dg Abd 2 Views  Result Date: 11/02/2016 CLINICAL DATA:  Acute onset of left lower quadrant abdominal pain. Initial encounter. EXAM: ABDOMEN - 2 VIEW COMPARISON:  Abdominal radiograph performed 10/28/2016, and chest radiograph performed 10/29/2016 FINDINGS: The lungs are well-aerated and clear. There is no evidence of focal opacification, pleural effusion or pneumothorax. The lung apices are incompletely imaged on this study. The cardiomediastinal silhouette is within normal limits. The visualized bowel gas pattern is unremarkable. Scattered stool and air are seen within the colon; there is no evidence of small bowel dilatation to suggest obstruction. No free intra-abdominal air is identified on the provided upright view. The patient's enteric tube is noted ending overlying the third segment of the duodenum. Clips are noted within the right upper quadrant, reflecting prior cholecystectomy. No acute osseous abnormalities are seen; the sacroiliac joints are unremarkable in appearance. Lumbar spinal fusion hardware is noted. IMPRESSION: 1. Unremarkable bowel gas pattern;  no free intra-abdominal air seen. Moderate amount of stool noted in the colon. 2. Enteric tube noted ending overlying the third segment of the duodenum. 3. No acute cardiopulmonary process seen. Electronically Signed   By: Roanna RaiderJeffery  Chang M.D.   On: 11/02/2016 06:41        Medical Problem List and Plan: 1.  Dizziness with gait deficits secondary to multiple cerebellar and brainstem infarcts. Recommendations of 30 day event monitor as outpatient             -admit to inpatient rehab 2.  DVT Prophylaxis/Anticoagulation: Subcutaneous heparin. Monitor for any bleeding episodes 3. Pain Management: Tylenol as needed 4. Mood: Zoloft 50 mg daily 5. Neuropsych: This patient is capable of making decisions on her own behalf. 6. Skin/Wound Care:  Routine skin checks 7. Fluids/Electrolytes/Nutrition: Routine I&O with follow-up chemistries 8. Dysphagia. Patient with tube feeds. Follow-up speech therapy. 9. Hypertension. Permissive hypertension. Monitor of increased mobility. Patient on Hyzaar 100-12 0.5 mg daily prior to admission. Resume as needed 10. Hyperlipidemia. Lipitor 11. Hypokalemia. Follow-up chemistries upon admit    Post Admission Physician Evaluation: 1. Functional deficits secondary  to multiple cerebellar and brainstem infarcts. 2. Patient is admitted to receive collaborative, interdisciplinary care between the physiatrist, rehab nursing staff, and therapy team. 3. Patient's level of medical complexity and substantial therapy needs in context of that medical necessity cannot be provided at a lesser intensity of care such as a SNF. 4. Patient has experienced substantial functional loss from his/her baseline which was documented above under the "Functional History" and "Functional Status" headings.  Judging by the patient's diagnosis, physical exam, and functional history, the patient has potential for functional progress which will result in measurable gains while on inpatient rehab.  These gains will be of substantial and practical use upon discharge  in facilitating mobility and self-care at the household level. 5. Physiatrist will provide 24 hour management of medical needs as well as oversight of the therapy plan/treatment and provide guidance as appropriate regarding the interaction of the two. 6. The Preadmission Screening has been reviewed and patient status is unchanged unless otherwise stated above. 7. 24 hour rehab nursing will assist with bladder management, bowel management, safety, skin/wound care, disease management, medication administration, pain management and patient education  and help integrate therapy concepts, techniques,education, etc. 8. PT will assess and treat for/with: Lower extremity strength, range  of motion, stamina, balance, functional mobility, safety, adaptive techniques and equipment ,vestibular assessment, ego support, community reentry.   Goals are: mod I to supervision. 9. OT will assess and treat for/with: ADL's, functional mobility, safety, upper extremity strength, adaptive techniques and equipment, NMR, vestibular rx, ego support.   Goals are: mod I to supervision. Therapy may proceed with showering this patient. 10. SLP will assess and treat for/with: cognition, speech, communication.  Goals are: mod I to supervision. 11. Case Management and Social Worker will assess and treat for psychological issues and discharge planning. 12. Team conference will be held weekly to assess progress toward goals and to determine barriers to discharge. 13. Patient will receive at least 3 hours of therapy per day at least 5 days per week. 14. ELOS: 7-10 days       15. Prognosis:  excellent     Ranelle OysterZachary T. Keyonda Bickle, MD, [facility]yFAAPMR [facility] 11/03/2016  Charlton AmorANGIULLI,DANIEL J., PA-C 11/03/2016  Document is being moved into rehab chart today. H and P, examination, etc all performed yesterday 11/03/16 ZTS

## 2016-11-04 NOTE — Evaluation (Signed)
Speech Language Pathology Assessment and Plan  Patient Details  Name: [name] MRN: [id number] Date of Birth: 03-01-1940  SLP Diagnosis: Dysphagia  Rehab Potential: Good ELOS:  10-14 days    Today's Date: 11/04/2016 SLP Individual Time: 6579-0383 SLP Individual Time Calculation (min): 55 min   Problem List:  Patient Active Problem List   Diagnosis Date Noted  . 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 Acute ischemic stroke ([place])   . Benign essential HTN   . Dysarthria, post-stroke   . Leukocytosis   . Acute blood loss anemia   . 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 Acute encephalopathy 10/28/2016  . 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 INSECT BITE 07/04/2009  . HYPERLIPIDEMIA 06/03/2007  . ALLERGIC RHINITIS 06/03/2007  . VAGINITIS, ATROPHIC 06/03/2007  . CONTACT DERMATITIS&OTH ECZEMA DUE OTH [place] AGENT 06/03/2007  . COLONIC POLYPS, HX OF 06/03/2007   Past Medical History:  Past Medical History:  Diagnosis Date  . Arthritis   . Hypertension    Past Surgical History:  Past Surgical History:  Procedure Laterality Date  . BACK SURGERY    . CHOLECYSTECTOMY  2006    Assessment / Plan / Recommendation Clinical Impression   [name] is a 76 y.o. right handed female with history of hypertension. Per chart review patient lives alone in a 1 level home with no steps to entry and independent prior to admission. She has a son who is disabled and can provide good support. Presented 10/28/2016 after syncopal episode after leaving church. When she walked out of church she became dizzy had an episode of vomiting followed by syncope. Cranial CT scan negative. 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 No large vessel occlusion or high-grade stenosis. MRI of the brain reviewed, showing multiple brainstem and cerebellar  infarcts. Per report, several small areas of acute early subacute infarction involving left medulla and scattered throughout the left greater than right mid to inferior cerebellar hemispheres as well as vermis. No acute hemorrhage noted. Troponin negative. Hypokalemia 2.6 with supplement added. Echocardiogram with ejection fraction of 33% grade 1 diastolic dysfunction. Patient did initially require intubation for worsening mental status, somnolence, and inability to protect airway in addition to hypoxia. She was extubated 10/29/2016. Neurology consulted maintained on aspirin and Plavix therapy for CVA prophylaxis and recommendations a 30 day event monitor as outpatient. Subcutaneous heparin for DVT prophylaxis. Patient NPO and currently with a nasogastric tube for nutritional support and follow-up speech therapy for dysphagia. Physical occupational therapy ongoing with recommendations of physical medicine rehabilitation consult. Patient was admitted for a comprehensive rehabilitation program.  SLP evaluation was completed on 11/04/2016 with the following results: Pt presents with s/s of a moderately severe pharyngeal dysphagia.  Pt has good oral control of boluses but has subjectively decreased hyolaryngeal elevation during the swallow.  Pt has explosive coughing with ice chips which are mitigated to immediate throat clearing during trials of warm thin liquids via teaspoon and purees with supervision verbal cues for multiple effortful swallows to facilitate bolus clearance.  Pt also has decreased management of secretions with coughing on her saliva and frequent suctioning throughout evaluation.  Vocal quality not notable for significant wetness though.  As a result, pt would benefit from skilled ST while inpatient in order to maximize functional independence and reduce burden of care prior to discharge.  Anticipate that pt will need ST follow  up at next level of care give expected long course of recovery.      Skilled Therapeutic Interventions          Bedside swallow evaluation completed with results and recommendations reviewed with patient.   Pt was able to return demonstration of EMST at 30 cm H20 with supervision instructional cues and a self perceived effort level of 7/10 and no ill effects such as pain, lightheadness, or changes in vitals.  Pt was also able to return demonstration of pharyngeal strengthening exercises wtih supervision instructional cues.  Discussed plan of care with pt and anticipated prolonged course of recovery given the extent of her deficits.  All questions were answered to pt's satisfaction at this time.  Pt was left sitting upright in wheelchair with call bell within reach.    SLP Assessment  Patient will need skilled Speech Lanaguage Pathology Services during CIR admission    Recommendations  Medication Administration: Via alternative means Postural Changes and/or Swallow Maneuvers: Seated upright 90 degrees Oral Care Recommendations: Oral care QID;Oral care prior to ice chip/H20 Recommendations for Other Services: Neuropsych consult Patient destination: Home Follow up Recommendations: Outpatient SLP Equipment Recommended: To be determined    SLP Frequency 3 to 5 out of 7 days   SLP Duration  SLP Intensity  SLP Treatment/Interventions    Minumum of 1-2 x/day, 30 to 90 minutes  Cueing hierarchy;Dysphagia/aspiration precaution training;Functional tasks;Internal/external aids;Patient/family education    Pain Pain Assessment Pain Assessment: No/denies pain  Prior Functioning Cognitive/Linguistic Baseline: Within functional limits Type of Home: House  Lives With: Alone Available Help at Discharge: Family;Available PRN/intermittently Vocation: Retired  Function:  Eating Eating   Modified Consistency Diet:  (NPO with NG tube ) Eating Assist Level: Supervision or verbal cues (PO trials with SLP )           Cognition Comprehension Comprehension  assist level: Follows complex conversation/direction with extra time/assistive device  Expression   Expression assist level: Expresses complex ideas: With extra time/assistive device  Social Interaction Social Interaction assist level: Interacts appropriately with others with medication or extra time (anti-anxiety, antidepressant).  Problem Solving Problem solving assist level: Solves complex problems: With extra time  Memory Memory assist level: More than reasonable amount of time   Short Term Goals: Week 1: SLP Short Term Goal 1 (Week 1): Pt will demonstrate readiness for repeat MBS as evidenced by minimal overt s/s of aspiration with PO trials and mod I use of swallowing precautions.   SLP Short Term Goal 2 (Week 1): Pt will complete 100 repetitions of pharyngeal strengthening exercises with mod I each session.   SLP Short Term Goal 3 (Week 1): Pt will complete 25 repetitions of EMST with mod I use of device and a self perceived effort level of <7/10.   SLP Short Term Goal 4 (Week 1): Pt will utilize suction to correct wet vocal quality no more than 10 times in a session.    Refer to Care Plan for Long Term Goals  Recommendations for other services: None   Discharge Criteria: Patient will be discharged from SLP if patient refuses treatment 3 consecutive times without medical reason, if treatment goals not met, if there is a change in medical status, if patient makes no progress towards goals or if patient is discharged from hospital.  The above assessment, treatment plan, treatment alternatives and goals were discussed and mutually agreed upon: by patient  Emilio Math 11/04/2016, 4:17 PM

## 2016-11-04 NOTE — Progress Notes (Signed)
Subjective/Complaints: Pain with PT, no pain issues, discussed swallowing  Review of systems negative for chest pain shortness of breath no nausea vomiting diarrhea constipation Objective: Vital Signs: Blood pressure (!) 116/96, pulse 93, temperature 98.5 F (36.9 C), temperature source Oral, resp. rate 18, weight 62.6 kg (138 lb 1.6 oz), SpO2 98 %. No results found. Results for orders placed or performed during the hospital encounter of 11/03/16 (from the past 72 hour(s))  CBC     Status: Abnormal   Collection Time: 11/03/16  6:56 PM  Result Value Ref Range   WBC 12.8 (H) 4.0 - 10.5 K/uL   RBC 3.63 (L) 3.87 - 5.11 MIL/uL   Hemoglobin 10.6 (L) 12.0 - 15.0 g/dL   HCT 32.3 (L) 36.0 - 46.0 %   MCV 89.0 78.0 - 100.0 fL   MCH 29.2 26.0 - 34.0 pg   MCHC 32.8 30.0 - 36.0 g/dL   RDW 14.7 11.5 - 15.5 %   Platelets 268 150 - 400 K/uL  Creatinine, serum     Status: None   Collection Time: 11/03/16  6:56 PM  Result Value Ref Range   Creatinine, Ser 0.68 0.44 - 1.00 mg/dL   GFR calc non Af Amer >60 >60 mL/min   GFR calc Af Amer >60 >60 mL/min    Comment: (NOTE) The eGFR has been calculated using the CKD EPI equation. This calculation has not been validated in all clinical situations. eGFR's persistently <60 mL/min signify possible Chronic Kidney Disease.   Glucose, capillary     Status: Abnormal   Collection Time: 11/03/16  8:09 PM  Result Value Ref Range   Glucose-Capillary 103 (H) 65 - 99 mg/dL   Comment 1 Notify RN   Glucose, capillary     Status: Abnormal   Collection Time: 11/04/16 12:03 AM  Result Value Ref Range   Glucose-Capillary 130 (H) 65 - 99 mg/dL   Comment 1 Notify RN   Glucose, capillary     Status: Abnormal   Collection Time: 11/04/16  3:50 AM  Result Value Ref Range   Glucose-Capillary 137 (H) 65 - 99 mg/dL   Comment 1 Notify RN   CBC WITH DIFFERENTIAL     Status: Abnormal   Collection Time: 11/04/16  6:21 AM  Result Value Ref Range   WBC 12.7 (H) 4.0 - 10.5  K/uL   RBC 3.59 (L) 3.87 - 5.11 MIL/uL   Hemoglobin 10.5 (L) 12.0 - 15.0 g/dL   HCT 32.7 (L) 36.0 - 46.0 %   MCV 91.1 78.0 - 100.0 fL   MCH 29.2 26.0 - 34.0 pg   MCHC 32.1 30.0 - 36.0 g/dL   RDW 15.1 11.5 - 15.5 %   Platelets 275 150 - 400 K/uL   Neutrophils Relative % 77 %   Neutro Abs 9.8 (H) 1.7 - 7.7 K/uL   Lymphocytes Relative 12 %   Lymphs Abs 1.6 0.7 - 4.0 K/uL   Monocytes Relative 10 %   Monocytes Absolute 1.3 (H) 0.1 - 1.0 K/uL   Eosinophils Relative 1 %   Eosinophils Absolute 0.1 0.0 - 0.7 K/uL   Basophils Relative 0 %   Basophils Absolute 0.0 0.0 - 0.1 K/uL  Comprehensive metabolic panel     Status: Abnormal   Collection Time: 11/04/16  6:21 AM  Result Value Ref Range   Sodium 144 135 - 145 mmol/L   Potassium 3.4 (L) 3.5 - 5.1 mmol/L   Chloride 100 (L) 101 - 111 mmol/L   CO2  33 (H) 22 - 32 mmol/L   Glucose, Bld 111 (H) 65 - 99 mg/dL   BUN 25 (H) 6 - 20 mg/dL   Creatinine, Ser 0.71 0.44 - 1.00 mg/dL   Calcium 9.0 8.9 - 10.3 mg/dL   Total Protein 6.6 6.5 - 8.1 g/dL   Albumin 3.5 3.5 - 5.0 g/dL   AST 27 15 - 41 U/L   ALT 26 14 - 54 U/L   Alkaline Phosphatase 102 38 - 126 U/L   Total Bilirubin 0.6 0.3 - 1.2 mg/dL   GFR calc non Af Amer >60 >60 mL/min   GFR calc Af Amer >60 >60 mL/min    Comment: (NOTE) The eGFR has been calculated using the CKD EPI equation. This calculation has not been validated in all clinical situations. eGFR's persistently <60 mL/min signify possible Chronic Kidney Disease.    Anion gap 11 5 - 15  Glucose, capillary     Status: Abnormal   Collection Time: 11/04/16  8:09 AM  Result Value Ref Range   Glucose-Capillary 140 (H) 65 - 99 mg/dL     HEENT: normal Cardio: RRR and No murmur Resp: CTA B/L and Unlabored GI: BS positive and Nontender and nondistended Extremity:  No Edema Skin:   Intact Neuro: Alert/Oriented and Other Motor strength is 5/5 bilateral deltoid bicep tricep grip hip flexor and extensor ankle dorsiflexor, no  dysmetria on finger-nose-finger testingNo evidence of nystagmus Musc/Skel:  Other No pain with upper limb or lower limb range of motion General no acute distress   Assessment/Plan: 1. 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 Physiatrist is providing close team supervision and 24 hour management of active medical problems listed below. Physiatrist and rehab team continue to assess barriers to discharge/monitor patient progress toward functional and medical goals. FIM:             Function - Chair/bed transfer Chair/bed transfer assist level: Supervision or verbal cues     Function - Comprehension Comprehension: Auditory Comprehension assist level: Follows complex conversation/direction with extra time/assistive device  Function - Expression Expression: Verbal Expression assist level: Expresses complex ideas: With extra time/assistive device  Function - Social Interaction Social Interaction assist level: Interacts appropriately with others with medication or extra time (anti-anxiety, antidepressant).  Function - Problem Solving Problem solving assist level: Solves complex problems: With extra time  Function - Memory Memory assist level: More than reasonable amount of time Patient normally able to recall (first 3 days only): Current season, Staff names and faces, That he or she is in a hospital, Location of own room  Medical Problem List and Plan: 1.  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 Recommendations of 30 day event monitor as outpatient             -CIR PT, OT, and evaluations today 2.  DVT Prophylaxis/Anticoagulation: Subcutaneous heparin. Monitor for any bleeding episodes 3. Pain Management: Tylenol as needed 4. Mood: Zoloft 50 mg  daily 5. Neuropsych: This patient is capable of making decisions on her own behalf. 6. Skin/Wound Care: Routine skin checks 7. Fluids/Electrolytes/Nutrition: Routine I&O with follow-up chemistries 8. Dysphagia. Patient with tube feeds. Follow-up speech therapy.  As discussed with patient swallowing may trail behind mobility recovery.  Possibility of PEG discussed 9. Hypertension. Permissive hypertension. Monitor of increased mobility. Patient on Hyzaar 100-12  0.5 mg daily prior to admission. Resume as needed Vitals:   11/03/16 2229 11/04/16 0354  BP: (!) 157/62 (!) 116/96  Pulse: 92 93  Resp:  18  Temp:  98.5 F (36.9 C)  SpO2: 97% 98%   10. Hyperlipidemia. Lipitor 11. Hypokalemia. Follow-up chemistries upon admit  LOS (Days) 1 A FACE TO FACE EVALUATION WAS PERFORMED  [name] 11/04/2016, 9:55 AM

## 2016-11-04 NOTE — Progress Notes (Signed)
Patient information reviewed and entered into e[facility] system by Darice Vicario, RN, CRRN, PPS Coordinator.  Information including medical coding and functional independence measure will be reviewed and updated through discharge.     Per nursing patient was given "Data Collection Information Summary for Patients in Inpatient Rehabilitation Facilities with attached "Privacy Act Statement-Health Care Records" upon admission.  

## 2016-11-04 NOTE — Progress Notes (Signed)
[name], MD Physician Signed Physical Medicine and Rehabilitation  Consult Note Date of Service: 11/03/2016 5:34 AM  Related encounter: ED to Hosp-Admission (Discharged) from 10/28/2016 in [facility]     Expand All Collapse All   [] Hide copied text [] Hover for attribution information      Physical Medicine and Rehabilitation Consult Reason for Consult: Decreased functional mobility Referring Physician: [facility]   HPI: [name] is a 76 y.o. right handed female with history of hypertension. Per chart review patient lives alone in a 1 level home with no steps to entry and independent prior to admission. She has a son who is disabled and can provide good support. Presented 10/28/2016 after syncopal episode after leaving church. When she walked out of church she became dizzy had an episode of vomiting followed by syncope. Cranial CT scan negative. 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 No large vessel occlusion or high-grade stenosis. MRI of the brain reviewed, showing multiple brainstem and cerebellar infarcts. Per report, several small areas of acute early subacute infarction involving left medulla and scattered throughout the left greater than right mid to inferior cerebellar hemispheres as well as vermis. No acute hemorrhage noted. Troponin negative. Hypokalemia 2.6 with supplement added. Echocardiogram with ejection fraction of 60% grade 1 diastolic dysfunction. Patient did initially require intubation for worsening mental status, somnolence, and inability to protect airway in addition to hypoxia. She was extubated 10/29/2016. Neurology consulted maintained on aspirin and Plavix therapy for CVA prophylaxis. Subcutaneous heparin for DVT prophylaxis. Patient NPO and currently with a nasogastric tube for nutritional support and follow-up speech therapy for dysphagia. Physical  occupational therapy ongoing with recommendations of physical medicine rehabilitation consult.   Review of Systems  Constitutional: Negative for chills and fever.  HENT: Negative for hearing loss.   Eyes: Positive for blurred vision. Negative for double vision.  Respiratory: Negative for cough and shortness of breath.   Cardiovascular: Negative for chest pain, palpitations and leg swelling.  Gastrointestinal: Positive for constipation. Negative for nausea and vomiting.  Genitourinary: Negative for dysuria, flank pain and hematuria.  Skin: Negative for rash.  Neurological: Positive for dizziness and focal weakness. Negative for seizures and weakness.  Psychiatric/Behavioral: Positive for depression.  All other systems reviewed and are negative.  Past medical history: HTN.      Past Surgical History:  Procedure Laterality Date  . BACK SURGERY     No pertinent family history of premature CVA.  Social History:  reports that she has never smoked. She has never used smokeless tobacco. She reports that she does not drink alcohol or use drugs. Allergies:       Allergies  Allergen Reactions  . Lisinopril     Dizziness per merged chart          Medications Prior to Admission  Medication Sig Dispense Refill  . losartan-hydrochlorothiazide (HYZAAR) 100-12.5 MG tablet Take 1 tablet by mouth daily.    . sertraline (ZOLOFT) 50 MG tablet Take 50 mg by mouth daily.    . traMADol (ULTRAM) 50 MG tablet Take 50 mg by mouth every 6 (six) hours as needed for moderate pain.      Home: Home Living Family/patient expects to be discharged to:: Inpatient rehab Living Arrangements: Alone Available Help at Discharge: Family, Friend(s), Available PRN/intermittently Type of Home: House Home Access: Level entry Home Layout: One level Bathroom Shower/Tub: Engineer, manufacturing systems: Standard  Bathroom Accessibility: Yes Home Equipment: Shower seat  Functional History: Prior  Function Level of Independence: Independent Comments: Pt reports she was independent without AD and able to complete all ADLs. Very active with Church and working out regularly. Functional Status:  Mobility: Bed Mobility Overal bed mobility: Modified Independent Bed Mobility: Supine to Sit Supine to sit: Modified independent (Device/Increase time) General bed mobility comments: Pt OOB in recliner upon arrival Transfers Overall transfer level: Needs assistance Equipment used: None Transfers: Sit to/from Stand Sit to Stand: Min assist General transfer comment: Min guard to steady in standing Ambulation/Gait Ambulation/Gait assistance: Min guard, Min assist Ambulation Distance (Feet): 15 Feet (to the hallway, followed by 6 bouts of 10 feet) Assistive device: None Gait Pattern/deviations: Step-through pattern, Decreased step length - right, Decreased step length - left, Decreased stride length, Decreased dorsiflexion - right, Drifts right/left, Narrow base of support General Gait Details: patient continues to demonstrate significant instability during ambulation without AD. Mulitmodal cues for pacing and focus on taking 10 quality steps at one time. with cues step length and cadence seem to improve but quickly loses balance after about 5 steps. She nees min A to correct for LOB and also reaches for other external support in the hallway. Gait velocity: decreased Gait velocity interpretation: <1.8 ft/sec, indicative of risk for recurrent falls  ADL: ADL Overall ADL's : Needs assistance/impaired Eating/Feeding: NPO (NG tube) Grooming: Standing, Minimal assistance Grooming Details (indicate cue type and reason): A for balance. Unsteady when turning head Upper Body Bathing: Set up, Supervision/ safety, Sitting Lower Body Bathing: Minimal assistance, Sit to/from stand Upper Body Dressing : Set up, Supervision/safety, Sitting Lower Body Dressing: Minimal assistance, Sit to/from stand Toilet  Transfer: Minimal assistance, Ambulation Toileting- Clothing Manipulation and Hygiene: Minimal assistance, Sit to/from stand Functional mobility during ADLs: Moderate assistance General ADL Comments: when mobilizing, pt required Mod A at times to regain balance when head turns are involved.   Cognition: Cognition Overall Cognitive Status: Within Functional Limits for tasks assessed Orientation Level: Oriented X4 Cognition Arousal/Alertness: Awake/alert Behavior During Therapy: WFL for tasks assessed/performed Overall Cognitive Status: Within Functional Limits for tasks assessed General Comments: Appears intact. will further assess  Blood pressure (!) 143/71, pulse 85, temperature 98.2 F (36.8 C), temperature source Oral, resp. rate 18, height 5' (1.524 m), weight 63.8 kg (140 lb 11.2 oz), SpO2 96 %. Physical Exam  Vitals reviewed. Constitutional: She is oriented to person, place, and time. She appears well-developed and well-nourished.  HENT:  Head: Normocephalic and atraumatic.  Nasogastric tube in place  Eyes: EOM are normal. Right eye exhibits no discharge. Left eye exhibits no discharge.  Neck: Normal range of motion. Neck supple. No thyromegaly present.  Cardiovascular: Normal rate, regular rhythm and normal heart sounds.   Respiratory: Effort normal and breath sounds normal. No respiratory distress.  GI: Soft. Bowel sounds are normal. She exhibits no distension.  Musculoskeletal: She exhibits no edema or tenderness.  Neurological: She is alert and oriented to person, place, and time.  Follows full commands Motor: 4+-5/5 throughout No ataxia, no dysmetria Sensation intact to light touch  Skin: Skin is warm and dry.  Psychiatric: She has a normal mood and affect. Her behavior is normal. Thought content normal.    Lab Results Last 24 Hours       Results for orders placed or performed during the hospital encounter of 10/28/16 (from the past 24 hour(s))  Glucose,  capillary     Status: Abnormal   Collection Time: 11/02/16  7:44 AM  Result Value Ref Range   Glucose-Capillary 141 (H) 65 - 99 mg/dL  Glucose, capillary     Status: None   Collection Time: 11/02/16 11:22 AM  Result Value Ref Range   Glucose-Capillary 99 65 - 99 mg/dL  Glucose, capillary     Status: Abnormal   Collection Time: 11/02/16  4:56 PM  Result Value Ref Range   Glucose-Capillary 107 (H) 65 - 99 mg/dL  Glucose, capillary     Status: Abnormal   Collection Time: 11/02/16  8:13 PM  Result Value Ref Range   Glucose-Capillary 118 (H) 65 - 99 mg/dL   Comment 1 Notify RN    Comment 2 Document in Chart   Glucose, capillary     Status: Abnormal   Collection Time: 11/03/16 12:33 AM  Result Value Ref Range   Glucose-Capillary 103 (H) 65 - 99 mg/dL   Comment 1 Notify RN    Comment 2 Document in Chart   Glucose, capillary     Status: Abnormal   Collection Time: 11/03/16  3:39 AM  Result Value Ref Range   Glucose-Capillary 136 (H) 65 - 99 mg/dL   Comment 1 Notify RN    Comment 2 Document in Chart       Imaging Results (Last 48 hours)  Dg Abd 2 Views  Result Date: 11/02/2016 CLINICAL DATA:  Acute onset of left lower quadrant abdominal pain. Initial encounter. EXAM: ABDOMEN - 2 VIEW COMPARISON:  Abdominal radiograph performed 10/28/2016, and chest radiograph performed 10/29/2016 FINDINGS: The lungs are well-aerated and clear. There is no evidence of focal opacification, pleural effusion or pneumothorax. The lung apices are incompletely imaged on this study. The cardiomediastinal silhouette is within normal limits. The visualized bowel gas pattern is unremarkable. Scattered stool and air are seen within the colon; there is no evidence of small bowel dilatation to suggest obstruction. No free intra-abdominal air is identified on the provided upright view. The patient's enteric tube is noted ending overlying the third segment of the duodenum. Clips are noted within  the right upper quadrant, reflecting prior cholecystectomy. No acute osseous abnormalities are seen; the sacroiliac joints are unremarkable in appearance. Lumbar spinal fusion hardware is noted. IMPRESSION: 1. Unremarkable bowel gas pattern; no free intra-abdominal air seen. Moderate amount of stool noted in the colon. 2. Enteric tube noted ending overlying the third segment of the duodenum. 3. No acute cardiopulmonary process seen. Electronically Signed   By: Roanna RaiderJeffery  Chang M.D.   On: 11/02/2016 06:41     Assessment/Plan: Diagnosis: Multiple cerebellar and brainstem infarcts. Labs and images independently reviewed.  Records reviewed and summated above. Stroke: Continue secondary stroke prophylaxis and Risk Factor Modification listed below:   Antiplatelet therapy:   Blood Pressure Management:  Continue current medication with prn's with permisive HTN per primary team Statin Agent:   Prediabetes management:  1. Does the need for close, 24 hr/day medical supervision in concert with the patient's rehab needs make it unreasonable for this patient to be served in a less intensive setting? Yes 2. Co-Morbidities requiring supervision/potential complications: HTN (monitor and provide prns in accordance with increased physical exertion and pain), diastolic dysfunction (monitor for signs/symptoms of fluid overload), post-stroke dysphagia (advance diet as tolerated), leukocytosis (cont to monitor for signs and symptoms of infection, further workup if indicated), ABLA (transfuse if necessary to ensure appropriate perfusion for increased activity tolerance) 3. Due to safety, disease management and patient education, does the patient require 24 hr/day rehab nursing? Yes 4. Does  the patient require coordinated care of a physician, rehab nurse, PT (1-2 hrs/day, 5 days/week), OT (1-2 hrs/day, 5 days/week) and SLP (1-2 hrs/day, 5 days/week) to address physical and functional deficits in the context of the above  medical diagnosis(es)? Yes Addressing deficits in the following areas: balance, endurance, locomotion, strength, transferring, bathing, dressing, feeding, toileting, swallowing and psychosocial support 5. Can the patient actively participate in an intensive therapy program of at least 3 hrs of therapy per day at least 5 days per week? Yes 6. The potential for patient to make measurable gains while on inpatient rehab is good 7. Anticipated functional outcomes upon discharge from inpatient rehab are modified independent and supervision  with PT, modified independent and supervision with OT, modified independent with SLP. 8. Estimated rehab length of stay to reach the above functional goals is: 6-10 days. 9. Anticipated D/C setting: Home 10. Anticipated post D/C treatments: HH therapy and Home excercise program 11. Overall Rehab/Functional Prognosis: good  RECOMMENDATIONS: This patient's condition is appropriate for continued rehabilitative care in the following setting: CIR Patient has agreed to participate in recommended program. Yes Note that insurance prior authorization may be required for reimbursement for recommended care.  Comment: Rehab Admissions Coordinator to follow up.  Maryla Morrow, MD, ABPMR Charlton Amor., PA-C 11/03/2016    Revision History              Routing History

## 2016-11-04 NOTE — Progress Notes (Signed)
Initial Nutrition Assessment  DOCUMENTATION CODES:   Not applicable  INTERVENTION:  Discontinue Jevity 1.2 formula.  Initiate Jevity 1.5 formula @ 35 ml/hr via Cortrak NGT and increase by 10 ml every 4 hours to goal rate of 55 ml/hr x 20 hours (may hold TF for up to 4 hours for therapy) to provide 1650 kcal (100% of needs), 70 grams of protein, and 836 ml of H2O.   Continue free water flushes of 200 ml q 8 hours. Total free water: 1436 ml/day.  NUTRITION DIAGNOSIS:   Inadequate oral intake related to inability to eat as evidenced by NPO status.  GOAL:   Patient will meet greater than or equal to 90% of their needs  MONITOR:   TF tolerance, Weight trends, Labs, I & O's, Diet advancement, Skin  REASON FOR ASSESSMENT:   Consult Enteral/tube feeding initiation and management  ASSESSMENT:   76 y.o. right handed female with history of hypertension. Presented 10/28/2016 after syncopal episode. 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 MRI of the brain reviewed, showing multiple brainstem and cerebellar infarcts. Per report, several small areas of acute early subacute infarction involving left medulla and scattered throughout the left greater than right mid to inferior cerebellar hemispheres as well as vermis. NPO and currently with a nasogastric tube for nutritional support and follow-up speech therapy for dysphagia.  Pt has been tolerating her tube feeds with no other difficulties. Weight has been stable. Pt currently has Jevity 1.2 formula infusing at goal rate of 55 ml/hr x 20 hours via cortrak NGT which is providing 1320 kcal (83% of kcal needs), 61 grams of protein (94% of protein needs). RD to modify tube feeds to better meet nutrition needs.  Nutrition-Focused physical exam completed. Findings are no fat depletion, moderate muscle depletion, and no edema.   Labs and medications reviewed.    Diet Order:   Diet NPO time specified Except for: Ice Chips  Skin:  Reviewed, no issues  Last BM:  10/23  Height:   Ht Readings from Last 1 Encounters:  11/01/16 5' (1.524 m)    Weight:   Wt Readings from Last 1 Encounters:  11/04/16 138 lb 1.6 oz (62.6 kg)    Ideal Body Weight:  45.45 kg  BMI:  Body mass index is 26.97 kg/m.  Estimated Nutritional Needs:   Kcal:  1600-1800  Protein:  65-80 grams  Fluid:  1.6 - 1.8 L/day  EDUCATION NEEDS:   No education needs identified at this time  Roslyn SmilingStephanie Glendine Swetz, MS, RD, LDN Pager # [phone] After hours/ weekend pager # [phone]

## 2016-11-04 NOTE — Progress Notes (Signed)
Standley Brooking, RN Rehab Admission Coordinator Signed Physical Medicine and Rehabilitation  PMR Pre-admission Date of Service: 11/03/2016 12:13 PM  Related encounter: ED to Hosp-Admission (Discharged) from 10/28/2016 in [facility]       [] Hide copied text PMR Admission Coordinator Pre-Admission Assessment  Patient: [name] is an 76 y.o., female MRN: [id number] DOB: July 12, 1940 Height: 5' (152.4 cm) Weight: 62.7 kg (138 lb 3.2 oz)                                                                                                                                                  Insurance Information HMO:     PPO: yes     PCP:      IPA:      80/20:      OTHER: medicare advantage plan PRIMARY: [employer] Medicare      Policy#: [id number]      Subscriber: pt CM Name: Gweneth Dimitri      Phone#: [phone]     Fax#: [phone] Pre-Cert#: [id number] for 7 days with Rebeca Alert to f/u phone [phone] fax [phone]      Employer: retired Benefits:  Phone #: [phone]     Name: 11/03/2016 Eff. Date: 01/13/16     Deduct: none      Out of Pocket Max: $4000      Life Max: none CIR: $160 co pay per day days 1 through 10      SNF: no co pay days 1-20: $50 co pay days 21-100 Outpatient: $20 co pay per visit     Co-Pay: visits per medical neccesity Home Health: 100%      Co-Pay: visits per medical neccesity DME: 80%     Co-Pay: 20% Providers: in network  SECONDARY: none      Medicaid Application Date:       Case Manager:  Disability Application Date:       Case Worker:   Emergency Contact Information        Contact Information    Name Relation Home Work Mobile   [place] Son [phone]     Tashari, Schoenfelder [phone]       Current Medical History  Patient Admitting Diagnosis: multiple cerebellar and brainstem infarcts  History of Present Illness: NAT:FTDD Rondell Reams Murrayis a 76 y.o.right handed femalewith history of  hypertension. Presented 10/28/2016 after syncopal episode after leaving church. When she walked out of church she became dizzy had an episode of vomiting followed by syncope. Cranial CT scan negative. 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 No large vessel occlusion or high-grade stenosis. MRI of the brain reviewed, showing multiple brainstem and cerebellar infarcts. Per report, several small areas of acute early subacute infarction involving left medulla and scattered throughout the left  greater than right mid to inferior cerebellar hemispheres as well as vermis. No acute hemorrhage noted. Troponin negative. Hypokalemia 2.6 with supplement added. Echocardiogram with ejection fraction of 60% grade 1 diastolic dysfunction. Patient did initially require intubation for worsening mental status,somnolence,and inability to protect airway in addition to hypoxia. She was extubated 10/29/2016. Neurology consulted maintained on aspirin and Plavix therapy for CVA prophylaxisand recommendations a 30 day event monitor as outpatient. Subcutaneous heparin for DVT prophylaxis. Patient NPOand currently with a nasogastric tube for nutritional support andfollow-up speech therapy for dysphagia.   Total: 0 NIHSS  Past Medical History  No past medical history on file.  Family History  family history is not on file.  Prior Rehab/Hospitalizations:  Has the patient had major surgery during 100 days prior to admission? Back surgery 05/2016  Current Medications   Current Facility-Administered Medications:  .  acetaminophen (TYLENOL) tablet 650 mg, 650 mg, Oral, Q6H PRN, Roberto Scales D, MD, 650 mg at 11/03/16 1015 .  aspirin suppository 300 mg, 300 mg, Rectal, Daily, Scatliffe, Kristen D, MD, 300 mg at 11/03/16 1008 .  atorvastatin (LIPITOR) tablet 40 mg, 40 mg, Oral, q1800, Marvel Plan, MD, 40 mg at 11/02/16 1723 .  clopidogrel (PLAVIX)  tablet 75 mg, 75 mg, Oral, Daily, Rice, Jamesetta Orleans, MD, 75 mg at 11/03/16 1008 .  etomidate (AMIDATE) injection, , Intravenous, PRN, Tegeler, Canary Brim, MD, 20 mg at 10/28/16 2023 .  feeding supplement (JEVITY 1.2 CAL) liquid 1,000 mL, 1,000 mL, Per Tube, Continuous, Roberto Scales D, MD, Last Rate: 55 mL/hr at 11/03/16 1009, 1,000 mL at 11/03/16 1009 .  free water 200 mL, 200 mL, Per Tube, Q8H, Nettey, Shayla D, MD, 200 mL at 11/03/16 1400 .  heparin injection 5,000 Units, 5,000 Units, Subcutaneous, Q8H, Desai, Rahul P, PA-C, 5,000 Units at 11/03/16 1315 .  insulin aspart (novoLOG) injection 0-15 Units, 0-15 Units, Subcutaneous, Q4H, Desai, Rahul P, PA-C, 2 Units at 11/03/16 1315 .  labetalol (NORMODYNE,TRANDATE) injection 10-20 mg, 10-20 mg, Intravenous, Q2H PRN, Nicholos Johns, Pradeep, MD .  lidocaine (PF) (XYLOCAINE) 1 % injection 5 mL, 5 mL, Infiltration, Once, Tegeler, Canary Brim, MD .  MEDLINE mouth rinse, 15 mL, Mouth Rinse, BID, Cyril Mourning V, MD, 15 mL at 11/02/16 2200 .  morphine 2 MG/ML injection 2 mg, 2 mg, Intravenous, Once, Schorr, [employer], NP .  polyethylene glycol (MIRALAX / GLYCOLAX) packet 17 g, 17 g, Oral, Daily, Roberto Scales D, MD, 17 g at 11/03/16 1008 .  senna-docusate (Senokot-S) tablet 2 tablet, 2 tablet, Oral, BID, Roberto Scales D, MD, 2 tablet at 11/03/16 1008 .  white petrolatum (VASELINE) gel, , Topical, PRN, Oretha Milch, MD, 1 application at 10/29/16 1504  Patients Current Diet: Diet NPO time specified Except for: Ice Chips  Precautions / Restrictions Precautions Precautions: Fall Restrictions Weight Bearing Restrictions: No   Has the patient had 2 or more falls or a fall with injury in the past year?No  Prior Activity Level Community (5-7x/wk): Independent and driving; works out 3 times per week  Journalist, newspaper / Corporate investment banker Devices/Equipment: None Home Equipment: Shower seat  Prior Device Use: Indicate  devices/aids used by the patient prior to current illness, exacerbation or injury? None of the above  Prior Functional Level Prior Function Level of Independence: Independent Comments: Pt reports she was independent without AD and able to complete all ADLs. Very active with Church and working out regularly.  Self Care: Did the patient need help bathing, dressing,  using the toilet or eating?  Independent  Indoor Mobility: Did the patient need assistance with walking from room to room (with or without device)? Independent  Stairs: Did the patient need assistance with internal or external stairs (with or without device)? Independent  Functional Cognition: Did the patient need help planning regular tasks such as shopping or remembering to take medications? Independent  Current Functional Level Cognition  Overall Cognitive Status: Within Functional Limits for tasks assessed Orientation Level: Oriented X4 General Comments: Appears intact. will further assess    Extremity Assessment (includes Sensation/Coordination)  Upper Extremity Assessment: Overall WFL for tasks assessed  Lower Extremity Assessment: Defer to PT evaluation RLE Deficits / Details: R DF 4-/5; sensation intact to LT; heel to shin negative LLE Deficits / Details: gross strength 5/5; sensation intact to LT; heel to shin negative    ADLs  Overall ADL's : Needs assistance/impaired Eating/Feeding: NPO (NG tube) Grooming: Standing, Minimal assistance Grooming Details (indicate cue type and reason): A for balance. Unsteady when turning head Upper Body Bathing: Set up, Supervision/ safety, Sitting Lower Body Bathing: Minimal assistance, Sit to/from stand Upper Body Dressing : Set up, Supervision/safety, Sitting Lower Body Dressing: Minimal assistance, Sit to/from stand Toilet Transfer: Minimal assistance, Ambulation Toileting- Clothing Manipulation and Hygiene: Minimal assistance, Sit to/from stand Functional  mobility during ADLs: Moderate assistance General ADL Comments: when mobilizing, pt required Mod A at times to regain balance when head turns are involved.     Mobility  Overal bed mobility: Modified Independent Bed Mobility: Supine to Sit Supine to sit: Modified independent (Device/Increase time) General bed mobility comments: Pt OOB in recliner upon arrival    Transfers  Overall transfer level: Needs assistance Equipment used: None Transfers: Sit to/from Stand Sit to Stand: Min assist General transfer comment: Min guard to steady in standing    Ambulation / Gait / Stairs / Wheelchair Mobility  Ambulation/Gait Ambulation/Gait assistance: Min guard, Min assist Ambulation Distance (Feet): 15 Feet (to the hallway, followed by 6 bouts of 10 feet) Assistive device: None Gait Pattern/deviations: Step-through pattern, Decreased step length - right, Decreased step length - left, Decreased stride length, Decreased dorsiflexion - right, Drifts right/left, Narrow base of support General Gait Details: patient continues to demonstrate significant instability during ambulation without AD. Mulitmodal cues for pacing and focus on taking 10 quality steps at one time. with cues step length and cadence seem to improve but quickly loses balance after about 5 steps. She nees min A to correct for LOB and also reaches for other external support in the hallway. Gait velocity: decreased Gait velocity interpretation: <1.8 ft/sec, indicative of risk for recurrent falls    Posture / Balance Dynamic Sitting Balance Sitting balance - Comments: able to lean forward and adjust socks, return to midline without difficulty Static Standing Balance Rhomberg - Eyes Opened: 1 Rhomberg - Eyes Closed:  (incr sway needing min guard for safety) Balance Overall balance assessment: Needs assistance Sitting-balance support: Feet supported, No upper extremity supported Sitting balance-Leahy Scale: Good Sitting balance -  Comments: able to lean forward and adjust socks, return to midline without difficulty Standing balance support: During functional activity, No upper extremity supported Standing balance-Leahy Scale: Poor Standing balance comment: Pt requires physical assist when involving head turns Rhomberg - Eyes Opened: 1 Rhomberg - Eyes Closed:  (incr sway needing min guard for safety) High level balance activites: Head turns, Turns, Direction changes High Level Balance Comments: pt demonstrates instability with these activities, needing min A to correct for LOBs.  Turns are slow and require several small steps to maintain stability    Special needs/care consideration BiPAP/CPAP  N/a CPM  N/a Continuous Drip IV  N/a Dialysis n/a Life Vest  N/a Oxygen  N/a Special Bed  N/a Trach Size  N/a Wound Vac n/a Skin intact Bowel mgmt: continent LBM 11/03/16; abdominal xray with moderate stool burden, bowel regimen initiated Bladder mgmt: continent Diabetic mgmt continent 30 day event monitor recommended at discharge as an outpt   Previous Home Environment Living Arrangements: Alone  Lives With: Alone Available Help at Discharge: Family, Available 24 hours/day (son, Zameria Vogl Cower, disabled and can do 24/7 supervision) Type of Home:  (one level townhome) Home Layout: One level Home Access: Level entry Bathroom Shower/Tub: Tub/shower unit, Health visitor: Standard Bathroom Accessibility: Yes How Accessible: Accessible via walker Home Care Services: No  Discharge Living Setting Plans for Discharge Living Setting: Lives with (comment) (to go stay with son ,Cherre Kothari Cower) Type of Home at Discharge: House Discharge Home Layout: One level Discharge Home Access: Stairs to enter Discharge Bathroom Shower/Tub: Tub/shower unit, Curtain Discharge Bathroom Toilet: Standard Discharge Bathroom Accessibility: Yes How Accessible: Accessible via walker Does the patient have any problems obtaining your  medications?: No    3 steps entry at her son's home  Social/Family/Support Systems Patient Roles: Parent Contact Information: Druscilla Petsch Cower, son Anticipated Caregiver: son  Anticipated Caregiver's Contact Information: see above Ability/Limitations of Caregiver: Tahlor Berenguer Cower is daisabled from numerous back surgreries Caregiver Availability: 24/7 Discharge Plan Discussed with Primary Caregiver: Yes Is Caregiver In Agreement with Plan?: Yes Does Caregiver/Family have Issues with Lodging/Transportation while Pt is in Rehab?: No  Goals/Additional Needs Patient/Family Goal for Rehab: Mod I to supervision with PT, OT. and SLP Expected length of stay: ELOS 6- 10 days Equipment Needs: cortrak Pt/Family Agrees to Admission and willing to participate: Yes Program Orientation Provided & Reviewed with Pt/Caregiver Including Roles  & Responsibilities: Yes  Decrease burden of Care through IP rehab admission: n/a  Possible need for SNF placement upon discharge:not anticipated  Patient Condition: This patient's condition remains as documented in the consult dated 11/03/2016, in which the Rehabilitation Physician determined and documented that the patient's condition is appropriate for intensive rehabilitative care in an inpatient rehabilitation facility. Will admit to inpatient rehab today.  Preadmission Screen Completed By:  Clois Dupes, 11/03/2016 3:52 PM ______________________________________________________________________   Discussed status with Dr. Riley Kill on 11/03/16 at  1551 and received telephone approval for admission today.  Admission Coordinator:  Clois Dupes, time 4098 Date 11/03/2016       Cosigned by: Ranelle Oyster, MD at 11/03/2016 4:17 PM  Revision History

## 2016-11-04 NOTE — Evaluation (Signed)
Occupational Therapy Assessment and Plan  Patient Details  Name: [name] MRN: [id number] Date of Birth: Apr 29, 1940  OT Diagnosis: muscle weakness (generalized) Rehab Potential: Rehab Potential (ACUTE ONLY): Good ELOS: ~9 days   Today's Date: 11/04/2016 OT Individual Time: 1300-1400 OT Individual Time Calculation (min): 60 min     Problem List:  Patient Active Problem List   Diagnosis Date Noted  . Cerebellar infarction ([place]) 11/03/2016  . Acute ischemic stroke ([place])   . Benign essential HTN   . Dysarthria, post-stroke   . Leukocytosis   . Acute blood loss anemia   . 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 Acute encephalopathy 10/28/2016  . 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 INSECT BITE 07/04/2009  . HYPERLIPIDEMIA 06/03/2007  . ALLERGIC RHINITIS 06/03/2007  . VAGINITIS, ATROPHIC 06/03/2007  . CONTACT DERMATITIS&OTH ECZEMA DUE OTH [place] AGENT 06/03/2007  . COLONIC POLYPS, HX OF 06/03/2007    Past Medical History:  Past Medical History:  Diagnosis Date  . Arthritis   . Hypertension    Past Surgical History:  Past Surgical History:  Procedure Laterality Date  . BACK SURGERY    . CHOLECYSTECTOMY  2006    Assessment & Plan Clinical Impression: Patient is a 76 y.o. year old female right handed femalewith history of hypertension. Per chart review patient lives alonein a [place] home with no steps to entry and independent prior to admission.She has a son who is disabled and can provide good support.Presented 10/28/2016 after syncopal episode after leaving church. When she walked out of church she became dizzy had an episode of vomiting followed by syncope. Cranial CT scan negative. 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 No large vessel occlusion or high-grade stenosis. MRI of the brain reviewed, showing multiple  brainstem and cerebellar infarcts.Per report, several small areas of acute early subacute infarction involving left medulla and scattered throughout the left greater than right mid to inferior cerebellar hemispheres as well as vermis. No acute hemorrhage noted. Troponin negative. Hypokalemia 2.6 with supplement added. Echocardiogram with ejection fraction of 37% grade 1 diastolic dysfunction. Patient did initially require intubation for worsening mental status,somnolence,and inability to protect airway in addition to hypoxia. She was extubated 10/29/2016. Neurology consulted maintained on aspirin and Plavix therapy for CVA prophylaxisand recommendations a 30 day event monitor as outpatient. Subcutaneous heparin for DVT prophylaxis. Patient NPOand currently with a nasogastric tube for nutritional support andfollow-up speech therapy for dysphagia. Patient transferred to CIR on 11/03/2016 .    Patient currently requires min with basic self-care skills and functional mobility without AD secondary to muscle weakness, decreased cardiorespiratoy endurance, impaired timing and sequencing and unbalanced muscle activation and decreased standing balance and decreased balance strategies.  Prior to hospitalization, patient could complete ADL with independent .  Patient will benefit from skilled intervention to decrease level of assist with basic self-care skills and increase independence with basic self-care skills prior to discharge home with care partner.  Anticipate patient will require intermittent supervision and follow up outpatient.  OT - End of Session Endurance Deficit: Yes OT Assessment Rehab Potential (ACUTE ONLY): Good OT Patient demonstrates impairments in the following area(s): Balance;Safety;Sensory;Endurance;Motor;Pain OT Basic ADL's Functional Problem(s): Grooming;Bathing;Dressing;Toileting OT Transfers Functional Problem(s): Toilet;Tub/Shower OT Plan OT Intensity: Minimum of 1-2 x/day, 45 to  90 minutes OT Frequency: 5 out of 7 days OT Duration/Estimated Length of [name]: ~9 days OT Treatment/Interventions: Balance/vestibular training;Discharge  planning;Functional electrical stimulation;Pain management;Self Care/advanced ADL retraining;Therapeutic Activities;UE/LE Coordination activities;Visual/perceptual remediation/compensation;Therapeutic Exercise;Skin care/wound managment;Patient/family education;Functional mobility training;Disease mangement/prevention;Cognitive remediation/compensation;Community reintegration;DME/adaptive equipment instruction;Neuromuscular re-education;Psychosocial support;UE/LE Strength taining/ROM OT Basic Self-Care Anticipated Outcome(s): mod I  OT Toileting Anticipated Outcome(s): mod I  OT Bathroom Transfers Anticipated Outcome(s): mod I  OT Recommendation Recommendations for Other Services: Neuropsych consult Patient destination: Home Follow Up Recommendations: None Equipment Recommended: To be determined   Skilled Therapeutic Intervention 1:1 Ot eval initiated with OT goals, purpose and role discussed. SElf care retraining at shower level with focus on functional ambulation around room without AD with min A. Pt able to shower sit to standing with occasional min A. Pt stood to brush and dry hair with supervision. Pt able to thread underwear in standing with UE support and min guard. After seated rest break. Pt ambulated to the gym with min A without AD with increased fatigue on the way back with increased hip drop on the right and shuffling on the right (decr hip flexion). Performed floor transfer with min A to get down to bottom and back up into sitting in a chair. Left resting in the recliner.   OT Evaluation Precautions/Restrictions  Precautions Precautions: Fall Restrictions Weight Bearing Restrictions: No General Chart Reviewed: Yes Family/Caregiver Present: No Vital Signs  Pain Pain Assessment Pain Assessment: No/denies pain Pain Score: 1   Home Living/Prior Functioning Home Living Family/patient expects to be discharged to:: Private residence Living Arrangements: Alone Available Help at Discharge: Family, Available PRN/intermittently Type of Home: House Home Access: Stairs to enter Technical brewer of Steps: 1 Home Layout: One level Bathroom Shower/Tub: Tub/shower unit, Walk-in shower Additional Comments: patient is wanting to return home alone but can [name] with her son if needed.   Lives With: Alone Prior Function Level of Independence: Independent with homemaking with ambulation  Able to Take Stairs?: Yes Driving: Yes Vocation: Retired Leisure: Hobbies-yes (Comment) Comments: Enjoys being active, knitting, reading, going out with friends, going to silver sneakers 3Xwk.  ADL ADL ADL Comments: see functional navigator Vision Baseline Vision/History: Wears glasses Wears Glasses: At all times Eye Alignment: Within Functional Limits Additional Comments: dissorted vision this morning but no current complaints Perception  Perception: Within Functional Limits Praxis Praxis: Intact Cognition Overall Cognitive Status: Within Functional Limits for tasks assessed Arousal/Alertness: Awake/alert Orientation Level: Person;Place;Situation Person: Oriented Place: Oriented Situation: Oriented Year: 2018 Month: October Day of Week: Correct Memory: Appears intact Immediate Memory Recall: Sock;Blue;Bed Memory Recall: Sock;Blue;Bed Memory Recall Sock: Without Cue Memory Recall Blue: Without Cue Memory Recall Bed: Without Cue Attention: Selective Selective Attention: Appears intact Awareness: Appears intact Problem Solving: Appears intact Safety/Judgment: Appears intact Sensation Sensation Light Touch: Impaired Detail Light Touch Impaired Details: Impaired RUE Proprioception: Appears Intact Coordination Gross Motor Movements are Fluid and Coordinated: Yes Fine Motor Movements are Fluid and Coordinated:  Yes Heel Shin Test: accurate and controlled Motor  Motor Motor:  (decreased stability and control) Motor - Skilled Clinical Observations: decr activity tolerance/ dec stability in standing  Mobility  Transfers Transfers: Sit to Stand;Stand to Sit Sit to Stand: 4: Min assist Stand to Sit: 5: Supervision  Trunk/Postural Assessment  Cervical Assessment Cervical Assessment: Within Functional Limits Thoracic Assessment Thoracic Assessment: Within Functional Limits Lumbar Assessment Lumbar Assessment: Within Functional Limits Postural Control Postural Control: Deficits on evaluation Righting Reactions: delayed  Balance Balance Balance Assessed: Yes Dynamic Sitting Balance Sitting balance - Comments: mild instability with reaching tasks Extremity/Trunk Assessment RUE Assessment RUE Assessment: Within Functional Limits LUE Assessment LUE Assessment: Within Functional  Limits   See Function Navigator for Current Functional Status.   Refer to Care Plan for Long Term Goals  Recommendations for other services: None    Discharge Criteria: Patient will be discharged from OT if patient refuses treatment 3 consecutive times without medical reason, if treatment goals not met, if there is a change in medical status, if patient makes no progress towards goals or if patient is discharged from hospital.  The above assessment, treatment plan, treatment alternatives and goals were discussed and mutually agreed upon: by patient  Nicoletta Ba 11/04/2016, 1:45 PM

## 2016-11-04 NOTE — Care Management Note (Signed)
[facility] Individual Statement of Services  Patient Name:  Nolon Nationsnna K Alamillo  Date:  11/04/2016  Welcome to the [facility].  Our goal is to provide you with an individualized program based on your diagnosis and situation, designed to meet your specific needs.  With this comprehensive rehabilitation program, you will be expected to participate in at least 3 hours of rehabilitation therapies Monday-Friday, with modified therapy programming on the weekends.  Your rehabilitation program will include the following services:  Physical Therapy (PT), Occupational Therapy (OT), Speech Therapy (ST), 24 hour per day rehabilitation nursing, Case Management (Social Worker), Rehabilitation Medicine, Nutrition Services and Pharmacy Services  Weekly team conferences will be held on Wednesday to discuss your progress.  Your Social Worker will talk with you frequently to get your input and to update you on team discussions.  Team conferences with you and your family in attendance may also be held.  Expected length of stay: 9-14 days  Overall anticipated outcome: mod/i level  Depending on your progress and recovery, your program may change. Your Social Worker will coordinate services and will keep you informed of any changes. Your Social Worker's name and contact numbers are listed  below.  The following services may also be recommended but are not provided by the Inpatient [facility]:   Driving Evaluations  Home Health Rehabiltiation Services  Outpatient Rehabilitation Services    Arrangements will be made to provide these services after discharge if needed.  Arrangements include referral to agencies that provide these services.  Your insurance has been verified to be:  UHC-Medicare  Your primary doctor is:    Pertinent information will be shared with your doctor and your insurance company.  Social Worker:  Dossie DerBecky Rudolfo Brandow, SW [phone] or [phone])  [phone]  Information discussed with and copy given to patient by: [name], Beverlyann Broxterman G, 11/04/2016, 1:12 PM

## 2016-11-04 NOTE — Progress Notes (Signed)
Social Work Assessment and Plan Social Work Assessment and Plan  Patient Details  Name: [name] MRN: [id number] Date of Birth: [birth year]  Today's Date: 11/04/2016  Problem List:  Patient Active Problem List   Diagnosis Date Noted  . Cerebellar infarction (HCC) 11/03/2016  . Acute ischemic stroke (HCC)   . Benign essential HTN   . Dysarthria, post-stroke   . Leukocytosis   . Acute blood loss anemia   . 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 Acute encephalopathy 10/28/2016  . 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 INSECT BITE 07/04/2009  . HYPERLIPIDEMIA 06/03/2007  . ALLERGIC RHINITIS 06/03/2007  . VAGINITIS, ATROPHIC 06/03/2007  . CONTACT DERMATITIS&OTH ECZEMA DUE OTH SPEC AGENT 06/03/2007  . COLONIC POLYPS, HX OF 06/03/2007   Past Medical History:  Past Medical History:  Diagnosis Date  . Arthritis   . Hypertension    Past Surgical History:  Past Surgical History:  Procedure Laterality Date  . BACK SURGERY    . CHOLECYSTECTOMY  2006   Social History:  reports that she has never smoked. She has never used smokeless tobacco. She reports that she does not drink alcohol or use drugs.  Family / Support Systems Marital Status: Single Patient Roles: Parent Children: [name]-son [phone] Other Supports: [employer] [phone] in [place] Anticipated Caregiver: Barbara Cower Ability/Limitations of Caregiver: Barbara Cower is disabled from back surgeries but is mobile and drives Caregiver Availability: 24/7 Family Dynamics: Close knit with both boys and Barbara Cower local son is very involved and will help her at discharge. She has friends and church members who are supportive and will come and visit her.  Social History Preferred language: English Religion: Non-Denominational Cultural Background: No issues Education: High School Read: Yes Write: Yes Employment Status: Retired Fish farm manager  Issues: No issues Guardian/Conservator: None-according to MD pt is capable of making her own decisions while here   Abuse/Neglect Physical Abuse: Denies Verbal Abuse: Denies Sexual Abuse: Denies Exploitation of patient/patient's resources: Denies Self-Neglect: Denies  Emotional Status Pt's affect, behavior adn adjustment status: Pt is motivated and hopes her swallow will get better and she can get the NG tube out of her nose. Her mobility is doing well and this is encouraging to her. She has always been one who took care of herself and wants to again. Hopes to go to her home but will go to son's if needs. Recent Psychosocial Issues: recent back surgery 05/2016 recovered well from Pyschiatric History: No history deferred depression screen due to coping well and verbalizes her concerns and questions. Has a strong faith and confidence in herself to do well here. Will monitor while here if needed will make referral to neuro-psych Substance Abuse History: No issues  Patient / Family Perceptions, Expectations & Goals Pt/Family understanding of illness & functional limitations: Pt and son have a good understanding of her stroke and deficits. Both have spoken with the MD's and feel they are ware of her treatmetn plan moving forward. Glad she is on the rehab unit where she will be getting lots of therapies. Premorbid pt/family roles/activities: Mom, grandmother, church member, retiree, home owner, etc Anticipated changes in roles/activities/participation: resume Pt/family expectations/goals: Pt states: " I want to be able to swallow and go to my home, but will go to my son's if I need too."  Son states: " I hope she does well here but will do whatever is needed she wouild for me."  [employer]: None Premorbid Home Care/DME  Agencies: Other (Comment) (has RW, 3in1 and tub seat from back surgery) Transportation available at discharge: [employer] Resource referrals recommended:  Support group (specify)  Discharge Planning Living Arrangements: Alone Support Systems: Children, Other relatives, Friends/neighbors Type of Residence: Private residence Insurance Resources: Media plannerrivate Insurance (specify) Investment banker, operational(UHC-Medicare) Financial Resources: Restaurant manager, fast foodocial Security Financial Screen Referred: No Living Expenses: Own Money Management: Patient Does the patient have any problems obtaining your medications?: No Home Management: Patient Patient/Family Preliminary Plans: Hopes to return to her home but can go to son's home where he can provide supervision. Will await therapy evaluations and work on a safe discharge plan. Pt is motivated to do well here. Social Work Anticipated Follow Up Needs: HH/OP, Support Group  Clinical Impression Very pleasant female who is motivated to do well and moving well with her mobility, main issue sis her swallowing issues. Son very supportive and involved and will assist at discharge if needed. Will await therapy evaluations and work on a plan. Will ask Speech when appropriate for MBS. Work on discharge needs.  [name], [name] 11/04/2016, 1:42 PM

## 2016-11-04 NOTE — Patient Care Conference (Signed)
Inpatient RehabilitationTeam Conference and Plan of Care Update Date: 11/04/2016   Time: 10:55 AM    Patient Name: [name]      Medical Record Number: [id number]  Date of Birth: 04/27/1940 Sex: Female         Room/Bed: 4W19C/4W19C-01 Payor Info: Payor: Advertising copywriter MEDICARE / Plan: [facility] MEDICARE / Product Type: *No Product type* /    Admitting Diagnosis: CVA  Admit Date/Time:  11/03/2016  6:46 PM Admission Comments: No comment available   Primary Diagnosis:  <principal problem not specified> Principal Problem: <principal problem not specified>  Patient Active Problem List   Diagnosis Date Noted  . Cerebellar infarction (HCC) 11/03/2016  . Acute ischemic stroke (HCC)   . Benign essential HTN   . Dysarthria, post-stroke   . Leukocytosis   . Acute blood loss anemia   . 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 Acute encephalopathy 10/28/2016  . 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 INSECT BITE 07/04/2009  . HYPERLIPIDEMIA 06/03/2007  . ALLERGIC RHINITIS 06/03/2007  . VAGINITIS, ATROPHIC 06/03/2007  . CONTACT DERMATITIS&OTH ECZEMA DUE OTH SPEC AGENT 06/03/2007  . COLONIC POLYPS, HX OF 06/03/2007    Expected Discharge Date:    Team Members Present: Physician leading conference: Dr. Claudette Laws Social Worker Present: Dossie Der, LCSW Nurse Present: Kennyth Arnold, RN PT Present: Grier Rocher, PT OT Present: Roney Mans, OT SLP Present: Reuel Derby, SLP PPS Coordinator present : Tora Duck, RN, CRRN     Current Status/Progress Goal Weekly Team Focus  Medical     medically adjusting medications and awaiting speech when ready for MBS        Bowel/Bladder        cont B & B     Swallow/Nutrition/ Hydration             ADL's   New eval        Mobility     new eval        Communication             Safety/Cognition/ Behavioral Observations            Pain        no  pain issues     Skin        skin good        *See Care Plan and progress notes for long and short-term goals.     Barriers to Discharge  Current Status/Progress Possible Resolutions Date Resolved   Physician                    Nursing                  PT                    OT                  SLP                SW                Discharge Planning/Teaching Needs:    Home to son's home where he can provide 24 hr supervision due to disabled. Swallow biggest deficit from CVA     Team Discussion:  New evaluations-setting goals. Moves well, biggest issue is her swallowing. Await when ready for MBS  Revisions to Treatment Plan:  New eval  [name], [name] 11/04/2016, 1:08 PM

## 2016-11-04 NOTE — Progress Notes (Signed)
Physical Therapy Assessment and Plan  Patient Details  Name: [name] MRN: [id number] Date of Birth: March 05, 1940  PT Diagnosis: Abnormal posture, Abnormality of gait and Difficulty walking Rehab Potential: Excellent ELOS: 7-10 days   Today's Date: 11/04/2016 PT Individual Time: 0810-0930 PT Individual Time Calculation (min): 80 min    Problem List:  Patient Active Problem List   Diagnosis Date Noted  . Cerebellar infarction ([place]) 11/03/2016  . Acute ischemic stroke ([place])   . Benign essential HTN   . Dysarthria, post-stroke   . Leukocytosis   . Acute blood loss anemia   . 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 Acute encephalopathy 10/28/2016  . 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 INSECT BITE 07/04/2009  . HYPERLIPIDEMIA 06/03/2007  . ALLERGIC RHINITIS 06/03/2007  . VAGINITIS, ATROPHIC 06/03/2007  . CONTACT DERMATITIS&OTH ECZEMA DUE OTH [place] AGENT 06/03/2007  . COLONIC POLYPS, HX OF 06/03/2007    Past Medical History:  Past Medical History:  Diagnosis Date  . Arthritis   . Hypertension    Past Surgical History:  Past Surgical History:  Procedure Laterality Date  . BACK SURGERY    . CHOLECYSTECTOMY  2006    Assessment & Plan Clinical Impression: Patient is a 76 y.o.right handed femalewith history of hypertension. Per chart review patient lives alonein a [place] home with no steps to entry and independent prior to admission.She has a son who is disabled and can provide good support.Presented 10/28/2016 after syncopal episode after leaving church. When she walked out of church she became dizzy had an episode of vomiting followed by syncope. Cranial CT scan negative. 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 No large vessel occlusion or high-grade stenosis. MRI of the brain reviewed, showing multiple brainstem and  cerebellar infarcts.Per report, several small areas of acute early subacute infarction involving left medulla and scattered throughout the left greater than right mid to inferior cerebellar hemispheres as well as vermis. No acute hemorrhage noted. Troponin negative. Hypokalemia 2.6 with supplement added. Echocardiogram with ejection fraction of 24% grade 1 diastolic dysfunction. Patient did initially require intubation for worsening mental status,somnolence,and inability to protect airway in addition to hypoxia. She was extubated 10/29/2016. Neurology consulted maintained on aspirin and Plavix therapy for CVA prophylaxisand recommendations a 30 day event monitor as outpatient. Subcutaneous heparin for DVT prophylaxis. Patient NPOand currently with a nasogastric tube for nutritional support andfollow-up speech therapy for dysphagia. Physical occupational therapy ongoing with recommendations of physical medicine rehabilitation consult.Patient was admitted for a comprehensive rehabilitation program. Patient transferred to CIR on 11/03/2016 .   Patient currently requires min with mobility secondary to muscle weakness, decreased motor planning and decreased sitting balance, decreased standing balance and decreased postural control.  Prior to hospitalization, patient was independent  with mobility and lived with Alone in a House home.  Home access is 1Stairs to enter.  Patient will benefit from skilled PT intervention to maximize safe functional mobility and minimize fall risk for planned discharge home with intermittent assist.  Anticipate patient will benefit from follow up OP at discharge.  PT - End of Session Activity Tolerance: Tolerates 30+ min activity with multiple rests Endurance Deficit: Yes PT Assessment Rehab Potential (ACUTE/IP ONLY): Excellent PT Patient demonstrates impairments in the following area(s): Balance;Endurance;Motor PT Transfers Functional Problem(s): Bed Mobility;Bed to  Chair;Car;Furniture;Floor PT Locomotion Functional Problem(s): Ambulation;Wheelchair Mobility;Stairs PT Plan PT Intensity: Minimum of 1-2 x/day ,45  to 90 minutes PT Frequency: 5 out of 7 days PT Duration Estimated Length of Stay: 7-10 days PT Treatment/Interventions: Ambulation/gait training;Balance/vestibular training;Discharge planning;Disease management/prevention;DME/adaptive equipment instruction;Functional mobility training;Neuromuscular re-education;Patient/family education;Psychosocial support;Stair training;Therapeutic Activities;Therapeutic Exercise;UE/LE Strength taining/ROM;UE/LE Coordination activities PT Transfers Anticipated Outcome(s): mod I with LRAD PT Locomotion Anticipated Outcome(s): ambulatory at supervision level with LRAD PT Recommendation Recommendations for Other Services: Therapeutic Recreation consult Therapeutic Recreation Interventions: Kitchen group;Outing/community reintergration Follow Up Recommendations: Outpatient PT Patient destination: Home Equipment Recommended: To be determined Equipment Details: reports having rw, BSC, cane at home  Skilled Therapeutic Intervention Pt sitting in w/c upon arrival and agreeable to PT session. Session included mobility progression with ambulation as well as functional mobility. The pt was able to ambulate with rw and CGA-min assist. Noted fatigue with increased distance as well as postural deviations noted. Transfers were also noted to be at min assist level when performed on mat table. Discussion regarding goals and rehab focus provided. Pt states that she would love to be able to go directly home but can stay with her son at his home if needed. Following session, pt returned to her room, sitting in w/c with all needs in reach.   PT Evaluation Precautions/Restrictions Precautions Precautions: Fall Restrictions Weight Bearing Restrictions: No Pain Pain Assessment Pain Assessment: No/denies pain Home Living/Prior  Functioning Home Living Living Arrangements: Alone Available Help at Discharge: Family;Available PRN/intermittently Type of Home: House Home Access: Stairs to enter Entrance Stairs-Number of Steps: 1 Home Layout: One level Bathroom Shower/Tub: Tub/shower unit;Walk-in shower Additional Comments: patient is wanting to return home alone but can stay with her son if needed.   Lives With: Alone Prior Function Level of Independence: Independent with homemaking with ambulation  Able to Take Stairs?: Yes Driving: Yes Vocation: Retired Leisure: Hobbies-yes (Comment) Comments: Enjoys being active, knitting, reading, going out with friends, going to silver sne[employer] 3Xwk.  Vision/Perception  Vision - History Baseline Vision: Wears contacts Patient Visual Report:  (Reports that vision is just a little off. ) Vision - Assessment Eye Alignment: Within Functional Limits Additional Comments: dissorted vision this morning but no current complaints Perception Perception: Within Functional Limits Praxis Praxis: Intact  Cognition Overall Cognitive Status: Within Functional Limits for tasks assessed Arousal/Alertness: Awake/alert Orientation Level: Oriented X4 Attention: Selective Selective Attention: Appears intact Memory: Appears intact Awareness: Appears intact Problem Solving: Appears intact Safety/Judgment: Appears intact Sensation Sensation Light Touch: Impaired Detail Light Touch Impaired Details: Impaired RUE Proprioception: Appears Intact Coordination Gross Motor Movements are Fluid and Coordinated: Yes Fine Motor Movements are Fluid and Coordinated: Yes Heel Shin Test: accurate and controlled Motor  Motor Motor:  (decreased stability and control) Motor - Skilled Clinical Observations: decr activity tolerance/ dec stability in standing   Mobility Transfers Sit to Stand: 4: Min assist Stand to Sit: 5: Supervision  Trunk/Postural Assessment  Cervical Assessment Cervical  Assessment: Within Functional Limits Thoracic Assessment Thoracic Assessment: Within Functional Limits Lumbar Assessment Lumbar Assessment: Within Functional Limits Postural Control Postural Control: Deficits on evaluation Righting Reactions: delayed  Balance Balance Balance Assessed: Yes Dynamic Sitting Balance Sitting balance - Comments: mild instability with reaching tasks Extremity Assessment  RUE Assessment RUE Assessment: Within Functional Limits LUE Assessment LUE Assessment: Within Functional Limits RLE Assessment RLE Assessment: Exceptions to [facility] RLE Strength RLE Overall Strength Comments: hip flexion 4/5, knee extension 4+/5, knee flexion 4/5, dorsiflexion 4+/5, plantar flexion 4/5 LLE Assessment LLE Assessment: Exceptions to [facility] LLE Strength LLE Overall Strength Comments: hip flexion 4/5, knee extension 4+/5, knee flexion 4/5,  dorsiflexion 4+/5, plantar flexion 4/5   See Function Navigator for Current Functional Status.   Refer to Care Plan for Long Term Goals  Recommendations for other services: Therapeutic Recreation  Kitchen group and Outing/community reintegration  Discharge Criteria: Patient will be discharged from PT if patient refuses treatment 3 consecutive times without medical reason, if treatment goals not met, if there is a change in medical status, if patient makes no progress towards goals or if patient is discharged from hospital.  The above assessment, treatment plan, treatment alternatives and goals were discussed and mutually agreed upon: by patient  Linard Millers, PT 11/04/2016, 4:46 PM

## 2016-11-05 ENCOUNTER — Inpatient Hospital Stay (HOSPITAL_COMMUNITY): Payer: Medicare Other | Admitting: Physical Therapy

## 2016-11-05 ENCOUNTER — Inpatient Hospital Stay (HOSPITAL_COMMUNITY): Payer: Medicare Other | Admitting: Speech Pathology

## 2016-11-05 ENCOUNTER — Inpatient Hospital Stay (HOSPITAL_COMMUNITY): Payer: Medicare Other | Admitting: Occupational Therapy

## 2016-11-05 ENCOUNTER — Inpatient Hospital Stay (HOSPITAL_COMMUNITY): Payer: Medicare Other

## 2016-11-05 DIAGNOSIS — R1312 Dysphagia, oropharyngeal phase: Secondary | ICD-10-CM

## 2016-11-05 LAB — GLUCOSE, CAPILLARY
GLUCOSE-CAPILLARY: 118 mg/dL — AB (ref 65–99)
GLUCOSE-CAPILLARY: 135 mg/dL — AB (ref 65–99)
GLUCOSE-CAPILLARY: 119 mg/dL — AB (ref 65–99)
Glucose-Capillary: 111 mg/dL — ABNORMAL HIGH (ref 65–99)
Glucose-Capillary: 128 mg/dL — ABNORMAL HIGH (ref 65–99)
Glucose-Capillary: 133 mg/dL — ABNORMAL HIGH (ref 65–99)
Glucose-Capillary: 137 mg/dL — ABNORMAL HIGH (ref 65–99)

## 2016-11-05 NOTE — Progress Notes (Signed)
Physical Therapy Session Note  Patient Details  Name: [name] MRN: [id number] Date of Birth: 05/27/1940  Today's Date: 11/05/2016 PT Individual Time: 1615-1700 PT Individual Time Calculation (min): 45 min   Short Term Goals: Week 1:  PT Short Term Goal 1 (Week 1): STG=LTG  Skilled Therapeutic Interventions/Progress Updates:    Pt received sitting in recliner and agreeable to PT.  Transfers with and without AD including sit<>stand and stand pivot with supervision assist from PT.   Berg balance test instructed by PT.  Patient demonstrates increased fall risk as noted by score of   39/56 on Berg Balance Scale.  (<36= high risk for falls, close to 100%; 37-45 significant >80%; 46-51 moderate >50%; 52-55 lower >25%)  Gait training with RW 18050ft, 14900ft and 2300ft; supervision assist from PT. Min cues for safety in turns as well as attention to task to prevent LOB with head turns.       Therapy Documentation Precautions:  Precautions Precautions: Fall Restrictions Weight Bearing Restrictions: No Vital Signs: Therapy Vitals Temp: 98.5 F (36.9 C) Temp Source: Oral Pulse Rate: (!) 106 Resp: 20 BP: (!) 150/90 Patient Position (if appropriate): Sitting Oxygen Therapy SpO2: 96 % O2 Device: Not Delivered Pain: 0/10     Balance: Standardized Balance Assessment Standardized Balance Assessment: Berg Balance Test Berg Balance Test Sit to Stand: Able to stand without using hands and stabilize independently Standing Unsupported: Able to stand safely 2 minutes Sitting with Back Unsupported but Feet Supported on Floor or Stool: Able to sit safely and securely 2 minutes Stand to Sit: Sits safely with minimal use of hands Transfers: Able to transfer safely, minor use of hands Standing Unsupported with Eyes Closed: Able to stand 10 seconds with supervision Standing Ubsupported with Feet Together: Needs help to attain position but able to stand for 30 seconds with feet  together From Standing, Reach Forward with Outstretched Arm: Can reach forward >12 cm safely (5") From Standing Position, Pick up Object from Floor: Able to pick up shoe, needs supervision From Standing Position, Turn to Look Behind Over each Shoulder: Looks behind one side only/other side shows less weight shift Turn 360 Degrees: Able to turn 360 degrees safely but slowly Standing Unsupported, Alternately Place Feet on Step/Stool: Able to complete >2 steps/needs minimal assist Standing Unsupported, One Foot in Front: Able to take small step independently and hold 30 seconds Standing on One Leg: Tries to lift leg/unable to hold 3 seconds but remains standing independently Total Score: 39   See Function Navigator for Current Functional Status.   Therapy/Group: Individual Therapy  [name] 11/05/2016, 5:53 PM

## 2016-11-05 NOTE — Progress Notes (Signed)
Subjective/Complaints: Up at sink standing. "getting myself cleaned up"  ROS: pt denies nausea, vomiting, diarrhea, cough, shortness of breath or chest pain   Objective: Vital Signs: Blood pressure (!) 155/72, pulse 88, temperature (!) 97.4 F (36.3 C), temperature source Oral, resp. rate 17, weight 62.6 kg (138 lb), SpO2 94 %. No results found. Results for orders placed or performed during the hospital encounter of 11/03/16 (from the past 72 hour(s))  CBC     Status: Abnormal   Collection Time: 11/03/16  6:56 PM  Result Value Ref Range   WBC 12.8 (H) 4.0 - 10.5 K/uL   RBC 3.63 (L) 3.87 - 5.11 MIL/uL   Hemoglobin 10.6 (L) 12.0 - 15.0 g/dL   HCT 32.3 (L) 36.0 - 46.0 %   MCV 89.0 78.0 - 100.0 fL   MCH 29.2 26.0 - 34.0 pg   MCHC 32.8 30.0 - 36.0 g/dL   RDW 14.7 11.5 - 15.5 %   Platelets 268 150 - 400 K/uL  Creatinine, serum     Status: None   Collection Time: 11/03/16  6:56 PM  Result Value Ref Range   Creatinine, Ser 0.68 0.44 - 1.00 mg/dL   GFR calc non Af Amer >60 >60 mL/min   GFR calc Af Amer >60 >60 mL/min    Comment: (NOTE) The eGFR has been calculated using the CKD EPI equation. This calculation has not been validated in all clinical situations. eGFR's persistently <60 mL/min signify possible Chronic Kidney Disease.   Glucose, capillary     Status: Abnormal   Collection Time: 11/03/16  8:09 PM  Result Value Ref Range   Glucose-Capillary 103 (H) 65 - 99 mg/dL   Comment 1 Notify RN   Glucose, capillary     Status: Abnormal   Collection Time: 11/04/16 12:03 AM  Result Value Ref Range   Glucose-Capillary 130 (H) 65 - 99 mg/dL   Comment 1 Notify RN   Glucose, capillary     Status: Abnormal   Collection Time: 11/04/16  3:50 AM  Result Value Ref Range   Glucose-Capillary 137 (H) 65 - 99 mg/dL   Comment 1 Notify RN   CBC WITH DIFFERENTIAL     Status: Abnormal   Collection Time: 11/04/16  6:21 AM  Result Value Ref Range   WBC 12.7 (H) 4.0 - 10.5 K/uL   RBC 3.59 (L)  3.87 - 5.11 MIL/uL   Hemoglobin 10.5 (L) 12.0 - 15.0 g/dL   HCT 32.7 (L) 36.0 - 46.0 %   MCV 91.1 78.0 - 100.0 fL   MCH 29.2 26.0 - 34.0 pg   MCHC 32.1 30.0 - 36.0 g/dL   RDW 15.1 11.5 - 15.5 %   Platelets 275 150 - 400 K/uL   Neutrophils Relative % 77 %   Neutro Abs 9.8 (H) 1.7 - 7.7 K/uL   Lymphocytes Relative 12 %   Lymphs Abs 1.6 0.7 - 4.0 K/uL   Monocytes Relative 10 %   Monocytes Absolute 1.3 (H) 0.1 - 1.0 K/uL   Eosinophils Relative 1 %   Eosinophils Absolute 0.1 0.0 - 0.7 K/uL   Basophils Relative 0 %   Basophils Absolute 0.0 0.0 - 0.1 K/uL  Comprehensive metabolic panel     Status: Abnormal   Collection Time: 11/04/16  6:21 AM  Result Value Ref Range   Sodium 144 135 - 145 mmol/L   Potassium 3.4 (L) 3.5 - 5.1 mmol/L   Chloride 100 (L) 101 - 111 mmol/L   CO2 33 (  H) 22 - 32 mmol/L   Glucose, Bld 111 (H) 65 - 99 mg/dL   BUN 25 (H) 6 - 20 mg/dL   Creatinine, Ser 0.71 0.44 - 1.00 mg/dL   Calcium 9.0 8.9 - 10.3 mg/dL   Total Protein 6.6 6.5 - 8.1 g/dL   Albumin 3.5 3.5 - 5.0 g/dL   AST 27 15 - 41 U/L   ALT 26 14 - 54 U/L   Alkaline Phosphatase 102 38 - 126 U/L   Total Bilirubin 0.6 0.3 - 1.2 mg/dL   GFR calc non Af Amer >60 >60 mL/min   GFR calc Af Amer >60 >60 mL/min    Comment: (NOTE) The eGFR has been calculated using the CKD EPI equation. This calculation has not been validated in all clinical situations. eGFR's persistently <60 mL/min signify possible Chronic Kidney Disease.    Anion gap 11 5 - 15  Glucose, capillary     Status: Abnormal   Collection Time: 11/04/16  8:09 AM  Result Value Ref Range   Glucose-Capillary 140 (H) 65 - 99 mg/dL  Glucose, capillary     Status: Abnormal   Collection Time: 11/04/16 11:41 AM  Result Value Ref Range   Glucose-Capillary 139 (H) 65 - 99 mg/dL  Glucose, capillary     Status: Abnormal   Collection Time: 11/04/16  4:26 PM  Result Value Ref Range   Glucose-Capillary 135 (H) 65 - 99 mg/dL  Glucose, capillary     Status:  Abnormal   Collection Time: 11/04/16  7:39 PM  Result Value Ref Range   Glucose-Capillary 101 (H) 65 - 99 mg/dL  Glucose, capillary     Status: Abnormal   Collection Time: 11/05/16 12:07 AM  Result Value Ref Range   Glucose-Capillary 137 (H) 65 - 99 mg/dL   Comment 1 Notify RN   Glucose, capillary     Status: Abnormal   Collection Time: 11/05/16  3:58 AM  Result Value Ref Range   Glucose-Capillary 128 (H) 65 - 99 mg/dL   Comment 1 Notify RN   Glucose, capillary     Status: Abnormal   Collection Time: 11/05/16  8:19 AM  Result Value Ref Range   Glucose-Capillary 118 (H) 65 - 99 mg/dL     HEENT: normal Cardio:.RRR without murmur. No JVD  Resp: CTA Bilaterally without wheezes or rales. Normal effort  GI: BS positive and Nontender and nondistended Extremity:  No Edema Skin:   Intact Neuro: Alert/Oriented and Other Motor strength is 5/5 bilateral deltoid bicep tricep grip hip flexor and extensor ankle dorsiflexor, no dysmetria on finger-nose-finger testing. No nystagmus Musc/Skel:  Other No pain with upper limb or lower limb range of motion General no acute distress   Assessment/Plan: 1. 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 Physiatrist is providing close team supervision and 24 hour management of active medical problems listed below. Physiatrist and rehab team continue to assess barriers to discharge/monitor patient progress toward functional and medical goals. FIM: Function - Bathing Position: Shower Body parts bathed by patient: Right arm, Left arm, Chest, Abdomen, Front perineal area, Buttocks, Right upper leg, Left upper leg, Left lower leg, Right lower leg Body parts bathed by helper: Back Assist Level: Touching or steadying assistance(Pt > 75%) (occasional)  Function- Upper Body Dressing/Undressing What  is the patient wearing?: Pull over shirt/dress Pull over shirt/dress - Perfomed by patient:  Thread/unthread right sleeve, Thread/unthread left sleeve, Put head through opening, Pull shirt over trunk Assist Level: Supervision or verbal cues Function - Lower Body Dressing/Undressing What is the patient wearing?: Underwear, Shoes, Non-skid slipper socks Position: Standing at sink Underwear - Performed by patient: Thread/unthread right underwear leg, Thread/unthread left underwear leg, Pull underwear up/down Non-skid slipper socks- Performed by patient: Don/doff right sock, Don/doff left sock Shoes - Performed by patient: Don/doff right shoe, Don/doff left shoe Assist for footwear: Setup Assist for lower body dressing: Touching or steadying assistance (Pt > 75%)     Function - Toilet Transfers Assist level to toilet: Touching or steadying assistance (Pt > 75%) Assist level from toilet: Touching or steadying assistance (Pt > 75%)  Function - Chair/bed transfer Chair/bed transfer method: Ambulatory Chair/bed transfer assist level: Touching or steadying assistance (Pt > 75%) Chair/bed transfer assistive device: Walker Chair/bed transfer details: Tactile cues for posture, Verbal cues for precautions/safety  Function - Locomotion: Wheelchair Will patient use wheelchair at discharge?: No Wheelchair activity did not occur: N/A Wheel 50 feet with 2 turns activity did not occur: N/A Wheel 150 feet activity did not occur: N/A Function - Locomotion: Ambulation Assistive device: Walker-rolling Max distance: 53 ft Assist level: Touching or steadying assistance (Pt > 75%) Assist level: Touching or steadying assistance (Pt > 75%) Assist level: Touching or steadying assistance (Pt > 75%) Walk 150 feet activity did not occur: Safety/medical concerns Assist level: Touching or steadying assistance (Pt > 75%)  Function - Comprehension Comprehension: Auditory Comprehension assist level: Follows  complex conversation/direction with extra time/assistive device  Function - Expression Expression: Verbal Expression assist level: Expresses complex ideas: With extra time/assistive device  Function - Social Interaction Social Interaction assist level: Interacts appropriately with others with medication or extra time (anti-anxiety, antidepressant).  Function - Problem Solving Problem solving assist level: Solves complex problems: With extra time  Function - Memory Memory assist level: More than reasonable amount of time Patient normally able to recall (first 3 days only): Current season, Staff names and faces, That he or she is in a hospital, Location of own room  Medical Problem List and Plan: 1.  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 Recommendations of 30 day event monitor as outpatient             -CIR PT, OT,   -reviewed safety awareness with patient 2.  DVT Prophylaxis/Anticoagulation: Subcutaneous heparin. Monitor for any bleeding episodes 3. Pain Management: Tylenol as needed 4. Mood: Zoloft 50 mg daily 5. Neuropsych: This patient is capable of making decisions on her own behalf. 6. Skin/Wound Care: Routine skin checks 7. Fluids/Electrolytes/Nutrition: Routine I&O with follow-up chemistries 8. Dysphagia. Patient with tube feeds. Follow-up speech therapy.    -swallowing re-evaluation per SLP  -tolerating NGT without issues at this time 9. Hypertension. Permissive hypertension. Monitor of increased mobility. Patient on Hyzaar 100-12 0.5 mg daily prior to admission. bp's controlled at present Vitals:   11/05/16 0348 11/05/16 0550  BP: (!) 158/70 (!) 155/72  Pulse: (!) 104 88  Resp: 18 17  Temp: 98.5 F (36.9 C) (!) 97.4 F (36.3 C)  SpO2: 97% 94%   10. Hyperlipidemia. Lipitor 11. Hypokalemia. 3.4 on 10/24    LOS (Days) 2 A FACE TO FACE EVALUATION WAS PERFORMED  [name] T 11/05/2016, 9:06 AM

## 2016-11-05 NOTE — Progress Notes (Signed)
Occupational Therapy Session Note  Patient Details  Name: [name] MRN: [id number] Date of Birth: 02/28/1940  Today's Date: 11/05/2016 OT Individual Time: 1145-1300 OT Individual Time Calculation (min): 75 min    Skilled Therapeutic Interventions/Progress Updates: Patient completed standing and functional mobility balance activities with close S and more challenged when stepping backwards or to her right environment.    She stated that when she completes neck extension to watch television, the left to posterior aspect of her neck aches.    The muscles felt tight in that area.   This clincian helped to reposition her recliner chair further back into her room so patient does not have to hyperextend her neck.    Also pillows were placed at patient lower and upper back and behind head, along with a neck roll.         She was left seated in her recliner with her legs elevated and call bell and phone in place.     Therapy Documentation Precautions:  Precautions Precautions: Fall Restrictions Weight Bearing Restrictions: No   Pain: Pain Assessment Pain Assessment: No/denies pain Pain Score: 0-No pain    Therapy/Group: Individual Therapy  Bud Faceickett, Cicily Bonano [facility] CenterYe[facility] 11/05/2016, 1:43 PM

## 2016-11-05 NOTE — Progress Notes (Addendum)
Speech Language Pathology Daily Session Note  Patient Details  Name: [name] MRN: [id number] Date of Birth: 10/03/1940  Today's Date: 11/05/2016 SLP Individual Time: 1400-1500 SLP Individual Time Calculation (min): 60 min  Short Term Goals: Week 1: SLP Short Term Goal 1 (Week 1): Pt will demonstrate readiness for repeat MBS as evidenced by minimal overt s/s of aspiration with PO trials and mod I use of swallowing precautions.   SLP Short Term Goal 2 (Week 1): Pt will complete 100 repetitions of pharyngeal strengthening exercises with mod I each session.   SLP Short Term Goal 3 (Week 1): Pt will complete 25 repetitions of EMST with mod I use of device and a self perceived effort level of <7/10.   SLP Short Term Goal 4 (Week 1): Pt will utilize suction to correct wet vocal quality no more than 10 times in a session.    Skilled Therapeutic Interventions: Skilled treatment session focused on dysphagia goals. SLP facilitated session by providing Min A verbal cues to complete 30 repetitions of EMST accurately with a self-perceived effort level of 7/10. Patient also performed 30 repetitions of the CTAR exercises with Mod I. Patient performed oral care via the suction toothbrush independently and consumed trials of small ice chips with multiple swallows and delayed cough X 1 of 4 trials. Overall, patient reported an increase in swallowing function today with increased management of saliva and decreased use of suction. Patient left upright in recliner with all needs within reach. Continue with current plan of care.      Function:  Eating Eating     Eating Assist Level: Supervision or verbal cues (with trials from SLP )           Cognition Comprehension Comprehension assist level: Follows complex conversation/direction with extra time/assistive device  Expression   Expression assist level: Expresses complex ideas: With extra time/assistive device  Social Interaction Social Interaction  assist level: Interacts appropriately with others with medication or extra time (anti-anxiety, antidepressant).  Problem Solving Problem solving assist level: Solves complex problems: With extra time  Memory Memory assist level: More than reasonable amount of time    Pain Pain Assessment Pain Assessment: No/denies pain Pain Score: 0-No pain  Therapy/Group: Individual Therapy  Zenab Gronewold 11/05/2016, 3:26 PM

## 2016-11-05 NOTE — Progress Notes (Signed)
Physical Therapy Session Note  Patient Details  Name: [name] MRN: [id number] Date of Birth: 02/23/1940  Today's Date: 11/05/2016 PT Individual Time: 0905-0935 PT Individual Time Calculation (min): 30 min   Short Term Goals: Week 1:  PT Short Term Goal 1 (Week 1): STG=LTG  Skilled Therapeutic Interventions/Progress Updates:    Pt donned socks and shoes this AM from w/c with set-up assist. Functional transfers and gait in room with RW including toileting with close supervision to occasional steadying assist. Progressed to gait without AD to challenge balance for the rest of session including gait in hallways and in the room with overall min assist with facilitation for weightshift to increase step length. Biodex for NMR for balance retraining using random control with focus on decreasing UE reliance to increase balance strategy re-training using compliant surface and changing speed of circle.   Five times Sit to Stand Test (FTSS) Method: Use a straight back chair with a solid seat that is 16-18" high. Ask participant to sit on the chair with arms folded across their chest.   Instructions: "Stand up and sit down as quickly as possible 5 times, keeping your arms folded across your chest."   Measurement: Stop timing when the participant stands the 5th time.  TIME: _20_____ (in seconds)  Times > 13.6 seconds is associated with increased disability and morbidity (Guralnik, 2000) Times > 15 seconds is predictive of recurrent falls in healthy individuals aged 74 and older (Buatois, et al., 2008) Normal performance values in community dwelling individuals aged 74 and older (Bohannon, 2006): o 60-69 years: 11.4 seconds o 70-79 years: 12.6 seconds o 80-89 years: 14.8 seconds  MCID: ? 2.3 seconds for Vestibular Disorders Wray Kearns(Meretta, 2006)   Therapy Documentation Precautions:  Precautions Precautions: Fall Restrictions Weight Bearing Restrictions: No  Pain:  Denies pain.   See  Function Navigator for Current Functional Status.   Therapy/Group: Individual Therapy  [name], [name]  Donna Folger B. [name], PT, DPT  11/05/2016, 12:11 PM

## 2016-11-06 ENCOUNTER — Inpatient Hospital Stay (HOSPITAL_COMMUNITY): Payer: Medicare Other | Admitting: Occupational Therapy

## 2016-11-06 ENCOUNTER — Inpatient Hospital Stay (HOSPITAL_COMMUNITY): Payer: Medicare Other | Admitting: Physical Therapy

## 2016-11-06 ENCOUNTER — Inpatient Hospital Stay (HOSPITAL_COMMUNITY): Payer: Medicare Other | Admitting: Speech Pathology

## 2016-11-06 LAB — GLUCOSE, CAPILLARY
GLUCOSE-CAPILLARY: 135 mg/dL — AB (ref 65–99)
GLUCOSE-CAPILLARY: 140 mg/dL — AB (ref 65–99)
GLUCOSE-CAPILLARY: 144 mg/dL — AB (ref 65–99)
GLUCOSE-CAPILLARY: 150 mg/dL — AB (ref 65–99)
GLUCOSE-CAPILLARY: 92 mg/dL (ref 65–99)
Glucose-Capillary: 146 mg/dL — ABNORMAL HIGH (ref 65–99)
Glucose-Capillary: 103 mg/dL — ABNORMAL HIGH (ref 65–99)
Glucose-Capillary: 141 mg/dL — ABNORMAL HIGH (ref 65–99)

## 2016-11-06 NOTE — Progress Notes (Signed)
Physical Therapy Session Note  Patient Details  Name: [name] MRN: [id number] Date of Birth: 1940/04/10  Today's Date: 11/06/2016 PT Individual Time: 0900-0955 PT Individual Time Calculation (min): 55 min   Short Term Goals: Week 1:  PT Short Term Goal 1 (Week 1): STG=LTG  Skilled Therapeutic Interventions/Progress Updates:    no c/o pain.  Requesting to shower this session.  Session focus on dynamic sitting and standing balance, transfers, and activity tolerance with bathing at shower level (standing for upper body/back, sitting for lower body), and dressing at sit<>stand level from recliner.  Pt completes oral hygiene, face washing, and hair brushing at sink with more than a reasonable time in standing.  Pt requires close supervision for ambulation throughout room with min verbal cues for not reaching out to furniture.  Supervision for all transfers in room.  Left positioned in recliner with call bell in reach and needs met.   Therapy Documentation Precautions:  Precautions Precautions: Fall Restrictions Weight Bearing Restrictions: No  See Function Navigator for Current Functional Status.   Therapy/Group: Individual Therapy  Michel Santee 11/06/2016, 10:12 AM

## 2016-11-06 NOTE — IPOC Note (Signed)
Overall Plan of Care [facility](IPOC) Patient Details Name: [name] K Armetta MRN: [id number] DOB: 10/04/1940  Admitting Diagnosis: <principal problem not specified>cerebellar infarcts  Hospital Problems: Active Problems:   Cerebellar infarction [facility](HCC)     Functional Problem List: Nursing Bowel, Pain  PT Balance, Endurance, Motor  OT Balance, Safety, Sensory, Endurance, Motor, Pain  SLP Nutrition  TR         Basic ADL's: OT Grooming, Bathing, Dressing, Toileting     Advanced  ADL's: OT       Transfers: PT Bed Mobility, Bed to Chair, Car, Furniture, Floor  OT Toilet, Research scientist (life sciences)Tub/Shower     Locomotion: PT Ambulation, Psychologist, prison and probation servicesWheelchair Mobility, Stairs     Additional Impairments: OT    SLP Swallowing      TR      Anticipated Outcomes Item Anticipated Outcome  Self Feeding    Swallowing  mod I    Basic self-care  mod I   Toileting  mod I    Bathroom Transfers mod I   Bowel/Bladder  Mod I  Transfers  mod I with LRAD  Locomotion  ambulatory at supervision level with LRAD  Communication     Cognition     Pain  less than 3 out of 10  Safety/Judgment  Mod I    Therapy Plan: PT Intensity: Minimum of 1-2 x/day ,45 to 90 minutes PT Frequency: 5 out of 7 days PT Duration Estimated Length of Stay: 7-10 days OT Intensity: Minimum of 1-2 x/day, 45 to 90 minutes OT Frequency: 5 out of 7 days OT Duration/Estimated Length of Stay: ~9 days SLP Intensity: Minumum of 1-2 x/day, 30 to 90 minutes SLP Frequency: 3 to 5 out of 7 days    Team Interventions: Nursing Interventions Patient/Family Education, Pain Management, Medication Management, Dysphagia/Aspiration Precaution Training, Discharge Planning, Bowel Management  PT interventions Ambulation/gait training, Balance/vestibular training, Discharge planning, Disease management/prevention, DME/adaptive equipment instruction, Functional mobility training, Neuromuscular re-education, Patient/family education, Psychosocial support, Stair  training, Therapeutic Activities, Therapeutic Exercise, UE/LE Strength taining/ROM, UE/LE Coordination activities  OT Interventions Balance/vestibular training, Discharge planning, Functional electrical stimulation, Pain management, Self Care/advanced ADL retraining, Therapeutic Activities, UE/LE Coordination activities, Visual/perceptual remediation/compensation, Therapeutic Exercise, Skin care/wound managment, Patient/family education, Functional mobility training, Disease mangement/prevention, Cognitive remediation/compensation, Community reintegration, Fish farm managerDME/adaptive equipment instruction, Neuromuscular re-education, Psychosocial support, UE/LE Strength taining/ROM  SLP Interventions Cueing hierarchy, Dysphagia/aspiration precaution training, Functional tasks, Internal/external aids, Patient/family education  TR Interventions    SW/CM Interventions Discharge Planning, Psychosocial Support, Patient/Family Education   Barriers to Discharge MD  Medical stability  Nursing      PT      OT      SLP Nutrition means 10-14 days   SW       Team Discharge Planning: Destination: PT-Home ,OT- Home , SLP-Home Projected Follow-up: PT-Outpatient PT, OT-  None, SLP-Outpatient SLP Projected Equipment Needs: PT-To be determined, OT- To be determined, SLP-To be determined Equipment Details: PT-reports having rw, BSC, cane at home, OT-  Patient/family involved in discharge planning: PT- Patient,  OT-Patient, SLP-Patient  MD ELOS: 7-10 days Medical Rehab Prognosis:  Excellent Assessment: The patient has been admitted for CIR therapies with the diagnosis of cerebellar infarcts. The team will be addressing functional mobility, strength, stamina, balance, safety, adaptive techniques and equipment, self-care, bowel and bladder mgt, patient and caregiver education, NMR, vestibular rx, swallowing and commnication. Goals have been set at mod I for PT, OT, SLP.    Ranelle OysterZachary T. Alette Kataoka, MD, [facility] Care HospitalFAAPMR      See Team  Conference Notes for weekly updates to the plan of care

## 2016-11-06 NOTE — Progress Notes (Signed)
Speech Language Pathology Daily Session Note  Patient Details  Name: Nolon Nationsnna K Cupo MRN: [id number] Date of Birth: 06/17/1940  Today's Date: 11/06/2016 SLP Individual Time: 1100-1200 SLP Individual Time Calculation (min): 60 min  Short Term Goals: Week 1: SLP Short Term Goal 1 (Week 1): Pt will demonstrate readiness for repeat MBS as evidenced by minimal overt s/s of aspiration with PO trials and mod I use of swallowing precautions.   SLP Short Term Goal 2 (Week 1): Pt will complete 100 repetitions of pharyngeal strengthening exercises with mod I each session.   SLP Short Term Goal 3 (Week 1): Pt will complete 25 repetitions of EMST with mod I use of device and a self perceived effort level of <7/10.   SLP Short Term Goal 4 (Week 1): Pt will utilize suction to correct wet vocal quality no more than 10 times in a session.    Skilled Therapeutic Interventions: Skilled treatment session focused on dysphagia goals. SLP facilitated session by providing supervision verbal cues to complete 30 repetitions of EMST accurately with a self-perceived effort level of 7/10. Patient also performed 30 repetitions of the CTAR exercises with Mod I. Patient performed oral care via the suction toothbrush independently and consumed trials of small ice chips with multiple swallows and delayed throat clearing/ cough in 100% of trials. Patient's vocal quality appeared more wet today with patient's reports of increased needed for oral suctioning, however, with Min verbal cues, patient able to maximize cough strength to clear residuals in pharynx. Patient left upright in recliner with all needs within reach. Continue with current plan of care.      Function:  Eating Eating     Eating Assist Level: Supervision or verbal cues (with trials from SLP )           Cognition Comprehension Comprehension assist level: Follows complex conversation/direction with extra time/assistive device  Expression   Expression assist  level: Expresses complex ideas: With extra time/assistive device  Social Interaction Social Interaction assist level: Interacts appropriately with others with medication or extra time (anti-anxiety, antidepressant).  Problem Solving Problem solving assist level: Solves complex problems: With extra time  Memory Memory assist level: More than reasonable amount of time    Pain No/Denies Pain   Therapy/Group: Individual Therapy  Eri Mcevers 11/06/2016, 3:54 PM

## 2016-11-06 NOTE — Progress Notes (Signed)
Subjective/Complaints: Stomach tender from heparin injections.   ROS: pt denies nausea, vomiting, diarrhea, cough, shortness of breath or chest pain    Objective: Vital Signs: Blood pressure (!) 154/75, pulse (!) 102, temperature 98.8 F (37.1 C), temperature source Oral, resp. rate 18, weight 62.6 kg (138 lb), SpO2 95 %. No results found. Results for orders placed or performed during the hospital encounter of 11/03/16 (from the past 72 hour(s))  CBC     Status: Abnormal   Collection Time: 11/03/16  6:56 PM  Result Value Ref Range   WBC 12.8 (H) 4.0 - 10.5 K/uL   RBC 3.63 (L) 3.87 - 5.11 MIL/uL   Hemoglobin 10.6 (L) 12.0 - 15.0 g/dL   HCT 32.3 (L) 36.0 - 46.0 %   MCV 89.0 78.0 - 100.0 fL   MCH 29.2 26.0 - 34.0 pg   MCHC 32.8 30.0 - 36.0 g/dL   RDW 14.7 11.5 - 15.5 %   Platelets 268 150 - 400 K/uL  Creatinine, serum     Status: None   Collection Time: 11/03/16  6:56 PM  Result Value Ref Range   Creatinine, Ser 0.68 0.44 - 1.00 mg/dL   GFR calc non Af Amer >60 >60 mL/min   GFR calc Af Amer >60 >60 mL/min    Comment: (NOTE) The eGFR has been calculated using the CKD EPI equation. This calculation has not been validated in all clinical situations. eGFR's persistently <60 mL/min signify possible Chronic Kidney Disease.   Glucose, capillary     Status: Abnormal   Collection Time: 11/03/16  8:09 PM  Result Value Ref Range   Glucose-Capillary 103 (H) 65 - 99 mg/dL   Comment 1 Notify RN   Glucose, capillary     Status: Abnormal   Collection Time: 11/04/16 12:03 AM  Result Value Ref Range   Glucose-Capillary 130 (H) 65 - 99 mg/dL   Comment 1 Notify RN   Glucose, capillary     Status: Abnormal   Collection Time: 11/04/16  3:50 AM  Result Value Ref Range   Glucose-Capillary 137 (H) 65 - 99 mg/dL   Comment 1 Notify RN   CBC WITH DIFFERENTIAL     Status: Abnormal   Collection Time: 11/04/16  6:21 AM  Result Value Ref Range   WBC 12.7 (H) 4.0 - 10.5 K/uL   RBC 3.59 (L) 3.87  - 5.11 MIL/uL   Hemoglobin 10.5 (L) 12.0 - 15.0 g/dL   HCT 32.7 (L) 36.0 - 46.0 %   MCV 91.1 78.0 - 100.0 fL   MCH 29.2 26.0 - 34.0 pg   MCHC 32.1 30.0 - 36.0 g/dL   RDW 15.1 11.5 - 15.5 %   Platelets 275 150 - 400 K/uL   Neutrophils Relative % 77 %   Neutro Abs 9.8 (H) 1.7 - 7.7 K/uL   Lymphocytes Relative 12 %   Lymphs Abs 1.6 0.7 - 4.0 K/uL   Monocytes Relative 10 %   Monocytes Absolute 1.3 (H) 0.1 - 1.0 K/uL   Eosinophils Relative 1 %   Eosinophils Absolute 0.1 0.0 - 0.7 K/uL   Basophils Relative 0 %   Basophils Absolute 0.0 0.0 - 0.1 K/uL  Comprehensive metabolic panel     Status: Abnormal   Collection Time: 11/04/16  6:21 AM  Result Value Ref Range   Sodium 144 135 - 145 mmol/L   Potassium 3.4 (L) 3.5 - 5.1 mmol/L   Chloride 100 (L) 101 - 111 mmol/L   CO2 33 (H)  22 - 32 mmol/L   Glucose, Bld 111 (H) 65 - 99 mg/dL   BUN 25 (H) 6 - 20 mg/dL   Creatinine, Ser 0.71 0.44 - 1.00 mg/dL   Calcium 9.0 8.9 - 10.3 mg/dL   Total Protein 6.6 6.5 - 8.1 g/dL   Albumin 3.5 3.5 - 5.0 g/dL   AST 27 15 - 41 U/L   ALT 26 14 - 54 U/L   Alkaline Phosphatase 102 38 - 126 U/L   Total Bilirubin 0.6 0.3 - 1.2 mg/dL   GFR calc non Af Amer >60 >60 mL/min   GFR calc Af Amer >60 >60 mL/min    Comment: (NOTE) The eGFR has been calculated using the CKD EPI equation. This calculation has not been validated in all clinical situations. eGFR's persistently <60 mL/min signify possible Chronic Kidney Disease.    Anion gap 11 5 - 15  Glucose, capillary     Status: Abnormal   Collection Time: 11/04/16  8:09 AM  Result Value Ref Range   Glucose-Capillary 140 (H) 65 - 99 mg/dL  Glucose, capillary     Status: Abnormal   Collection Time: 11/04/16 11:41 AM  Result Value Ref Range   Glucose-Capillary 139 (H) 65 - 99 mg/dL  Glucose, capillary     Status: Abnormal   Collection Time: 11/04/16  4:26 PM  Result Value Ref Range   Glucose-Capillary 135 (H) 65 - 99 mg/dL  Glucose, capillary     Status:  Abnormal   Collection Time: 11/04/16  7:39 PM  Result Value Ref Range   Glucose-Capillary 101 (H) 65 - 99 mg/dL  Glucose, capillary     Status: Abnormal   Collection Time: 11/05/16 12:07 AM  Result Value Ref Range   Glucose-Capillary 137 (H) 65 - 99 mg/dL   Comment 1 Notify RN   Glucose, capillary     Status: Abnormal   Collection Time: 11/05/16  3:58 AM  Result Value Ref Range   Glucose-Capillary 128 (H) 65 - 99 mg/dL   Comment 1 Notify RN   Glucose, capillary     Status: Abnormal   Collection Time: 11/05/16  8:19 AM  Result Value Ref Range   Glucose-Capillary 118 (H) 65 - 99 mg/dL  Glucose, capillary     Status: Abnormal   Collection Time: 11/05/16 12:07 PM  Result Value Ref Range   Glucose-Capillary 111 (H) 65 - 99 mg/dL  Glucose, capillary     Status: Abnormal   Collection Time: 11/05/16  4:26 PM  Result Value Ref Range   Glucose-Capillary 135 (H) 65 - 99 mg/dL  Glucose, capillary     Status: Abnormal   Collection Time: 11/05/16  7:28 PM  Result Value Ref Range   Glucose-Capillary 119 (H) 65 - 99 mg/dL  Glucose, capillary     Status: Abnormal   Collection Time: 11/05/16 11:49 PM  Result Value Ref Range   Glucose-Capillary 133 (H) 65 - 99 mg/dL   Comment 1 Notify RN   Glucose, capillary     Status: Abnormal   Collection Time: 11/06/16  3:59 AM  Result Value Ref Range   Glucose-Capillary 146 (H) 65 - 99 mg/dL  Glucose, capillary     Status: Abnormal   Collection Time: 11/06/16  7:51 AM  Result Value Ref Range   Glucose-Capillary 135 (H) 65 - 99 mg/dL  Glucose, capillary     Status: Abnormal   Collection Time: 11/06/16  8:14 AM  Result Value Ref Range   Glucose-Capillary 150 (  H) 65 - 99 mg/dL     HEENT: normal Cardio:.tach  Resp: CTA Bilaterally without wheezes or rales. Normal effort  GI: BS positive and Nontender and nondistended Extremity:  No Edema Skin:   Intact Neuro: Alert/Oriented and Other Motor strength is 5/5 bilateral deltoid bicep tricep grip hip  flexor and extensor ankle dorsiflexor, no dysmetria on finger-nose-finger testing. No nystagmus Musc/Skel:  Other No pain with upper limb or lower limb range of motion General no acute distress   Assessment/Plan: 1. 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 Physiatrist is providing close team supervision and 24 hour management of active medical problems listed below. Physiatrist and rehab team continue to assess barriers to discharge/monitor patient progress toward functional and medical goals. FIM: Function - Bathing Position: Shower Body parts bathed by patient: Right arm, Left arm, Chest, Abdomen, Front perineal area, Buttocks, Right upper leg, Left upper leg, Left lower leg, Right lower leg Body parts bathed by helper: Back Assist Level: Touching or steadying assistance(Pt > 75%) (occasional)  Function- Upper Body Dressing/Undressing What is the patient wearing?: Pull over shirt/dress Pull over shirt/dress - Perfomed by patient: Thread/unthread right sleeve, Thread/unthread left sleeve, Put head through opening, Pull shirt over trunk Assist Level: Supervision or verbal cues Function - Lower Body Dressing/Undressing What is the patient wearing?: Underwear, Shoes, Non-skid slipper socks Position: Standing at sink Underwear - Performed by patient: Thread/unthread right underwear leg, Thread/unthread left underwear leg, Pull underwear up/down Non-skid slipper socks- Performed by patient: Don/doff right sock, Don/doff left sock Shoes - Performed by patient: Don/doff right shoe, Don/doff left shoe Assist for footwear: Setup Assist for lower body dressing: Touching or steadying assistance (Pt > 75%)     Function - Toilet Transfers Assist level to toilet: Touching or steadying assistance (Pt > 75%) Assist level from  toilet: Touching or steadying assistance (Pt > 75%)  Function - Chair/bed transfer Chair/bed transfer method: Ambulatory Chair/bed transfer assist level: Touching or steadying assistance (Pt > 75%) Chair/bed transfer assistive device: Armrests Chair/bed transfer details: Tactile cues for posture, Verbal cues for precautions/safety  Function - Locomotion: Wheelchair Will patient use wheelchair at discharge?: No Wheelchair activity did not occur: N/A Wheel 50 feet with 2 turns activity did not occur: N/A Wheel 150 feet activity did not occur: N/A Function - Locomotion: Ambulation Assistive device: No device Max distance: 120' Assist level: Touching or steadying assistance (Pt > 75%) Assist level: Touching or steadying assistance (Pt > 75%) Assist level: Touching or steadying assistance (Pt > 75%) Walk 150 feet activity did not occur: Safety/medical concerns Assist level: Touching or steadying assistance (Pt > 75%)  Function - Comprehension Comprehension: Auditory Comprehension assist level: Follows complex conversation/direction with extra time/assistive device  Function - Expression Expression: Verbal Expression assist level: Expresses complex ideas: With extra time/assistive device  Function - Social Interaction Social Interaction assist level: Interacts appropriately with others with medication or extra time (anti-anxiety, antidepressant).  Function - Problem Solving Problem solving assist level: Solves complex problems: With extra time  Function - Memory Memory assist level: More than reasonable amount of time Patient normally able to recall (first 3 days only): Current season, Staff names and faces, That he or she is in a hospital, Location of own room  Medical Problem List and Plan: 1.  Dizziness with gait deficits secondary to left greater than right cerebellar and left medullary  infarcts. Recommendations of 30 day event monitor as outpatient             -CIR PT, OT,      2.  DVT Prophylaxis/Anticoagulation: Subcutaneous heparin.--dc, encourage ambulation 3. Pain Management: Tylenol as needed 4. Mood: Zoloft 50 mg daily 5. Neuropsych: This patient is capable of making decisions on her own behalf. 6. Skin/Wound Care: Routine skin checks 7. Fluids/Electrolytes/Nutrition: Routine I&O with follow-up chemistries 8. Dysphagia. Patient with tube feeds. Follow-up speech therapy.    -swallowing re-evaluation per SLP. Repeat MBS next week?  -tolerating NGT without issues at this time 9. Hypertension. Permissive hypertension. Monitor of increased mobility. Patient on Hyzaar 100-12 0.5 mg daily prior to admission. bp's controlled at present Vitals:   11/05/16 2212 11/06/16 0449  BP: (!) 164/78 (!) 154/75  Pulse: 90 (!) 102  Resp:  18  Temp:  98.8 F (37.1 C)  SpO2: 96% 95%   10. Hyperlipidemia. Lipitor 11. Hypokalemia. 3.4 on 10/24    LOS (Days) 3 A FACE TO FACE EVALUATION WAS PERFORMED  Yehonatan Grandison T 11/06/2016, 9:43 AM

## 2016-11-07 ENCOUNTER — Inpatient Hospital Stay (HOSPITAL_COMMUNITY): Payer: Medicare Other

## 2016-11-07 ENCOUNTER — Inpatient Hospital Stay (HOSPITAL_COMMUNITY): Payer: Medicare Other | Admitting: Physical Therapy

## 2016-11-07 ENCOUNTER — Inpatient Hospital Stay (HOSPITAL_COMMUNITY): Payer: Medicare Other | Admitting: Speech Pathology

## 2016-11-07 LAB — GLUCOSE, CAPILLARY
GLUCOSE-CAPILLARY: 110 mg/dL — AB (ref 65–99)
GLUCOSE-CAPILLARY: 157 mg/dL — AB (ref 65–99)
Glucose-Capillary: 131 mg/dL — ABNORMAL HIGH (ref 65–99)
Glucose-Capillary: 159 mg/dL — ABNORMAL HIGH (ref 65–99)
Glucose-Capillary: 109 mg/dL — ABNORMAL HIGH (ref 65–99)
Glucose-Capillary: 117 mg/dL — ABNORMAL HIGH (ref 65–99)

## 2016-11-07 NOTE — Progress Notes (Signed)
Physical Therapy Session Note  Patient Details  Name: [name] MRN: [id number] Date of Birth: 1940/04/21  Today's Date: 11/07/2016 PT Individual Time:1405-1515   70 min   Short Term Goals: Week 1:  PT Short Term Goal 1 (Week 1): STG=LTG  Skilled Therapeutic Interventions/Progress Updates:   Pt received sitting in WC and agreeable to PT. Gait in room with no AD to bathroom for toileting supervision assist while furniture walking to BR. Marland Kitchen   PT instructed pt in gait training with RW 2x 214f with supervision assist. Min cues for attention to task to prevent slight LOB. Gait training without AD x 1597f and 10037f with  Min assist overall. Intermittent mod assist in turns to prevent LOB with excessive adduction on the RLE. PT instructed pt in dynamic balance/gait to weave through 6 cones x 4 with mod assist progressing to MinLeonsist from PT. Stair negotiation training also instructed with BUE support x 12 with min assist.   Nustep reciprocal movement and endurance training x 10 minutes level 4>7. Min cues for proper ROM and step speed on nustep.   Patient returned to room and left sitting in recliner with call bell in reach and all needs met.         Therapy Documentation Precautions:  Precautions Precautions: Fall Restrictions Weight Bearing Restrictions: No Vital Signs: Therapy Vitals Temp: 97.7 F (36.5 C) Temp Source: Oral Pulse Rate: 92 Resp: 18 BP: (!) 154/73 Patient Position (if appropriate): Lying Oxygen Therapy SpO2: 97 % O2 Device: Not Delivered Pain. 0/10   See Function Navigator for Current Functional Status.   Therapy/Group: Individual Therapy  AusLorie Phenix/27/2018, 6:01 AM

## 2016-11-07 NOTE — Progress Notes (Signed)
Speech Language Pathology Daily Session Note  Patient Details  Name: [name] MRN: [id number] Date of Birth: 12/10/1940  Today's Date: 11/07/2016 SLP Individual Time: 1300-1345 SLP Individual Time Calculation (min): 45 min  Short Term Goals: Week 1: SLP Short Term Goal 1 (Week 1): Pt will demonstrate readiness for repeat MBS as evidenced by minimal overt s/s of aspiration with PO trials and mod I use of swallowing precautions.   SLP Short Term Goal 2 (Week 1): Pt will complete 100 repetitions of pharyngeal strengthening exercises with mod I each session.   SLP Short Term Goal 3 (Week 1): Pt will complete 25 repetitions of EMST with mod I use of device and a self perceived effort level of <7/10.   SLP Short Term Goal 4 (Week 1): Pt will utilize suction to correct wet vocal quality no more than 10 times in a session.    Skilled Therapeutic Interventions: Skilled treatment session focused on dysphagia goals. SLP facilitated session by providing observation of pt performing pharyngeal strengthening exercises (effortfull swallow). Pt with quick fatigue and able to complete 5 reps. Pt utilized suction to correct wet vocal quality throughout session. Pt given 1 small piece of ice chip with significant coughing. Education provided on POC to attempt MBS on 10/29 pending radiology availability.  Will update pt on Monday morning. Continue per current plan of care.      Function:  Eating Eating Eating activity did not occur: Safety/medical concerns               Cognition Comprehension Comprehension assist level: Follows complex conversation/direction with extra time/assistive device  Expression   Expression assist level: Expresses complex ideas: With extra time/assistive device  Social Interaction Social Interaction assist level: Interacts appropriately with others with medication or extra time (anti-anxiety, antidepressant).  Problem Solving Problem solving assist level: Solves complex  problems: With extra time  Memory Memory assist level: More than reasonable amount of time    Pain    Therapy/Group: Individual Therapy  [name] 11/07/2016, 3:02 PM

## 2016-11-07 NOTE — Progress Notes (Signed)
Subjective/Complaints: No new complaints. Slept well.   ROS: pt denies nausea, vomiting, diarrhea, cough, shortness of breath or chest pain   Objective: Vital Signs: Blood pressure (!) 154/73, pulse 92, temperature 97.7 F (36.5 C), temperature source Oral, resp. rate 18, weight 60.2 kg (132 lb 11.5 oz), SpO2 97 %. No results found. Results for orders placed or performed during the hospital encounter of 11/03/16 (from the past 72 hour(s))  Glucose, capillary     Status: Abnormal   Collection Time: 11/04/16 11:41 AM  Result Value Ref Range   Glucose-Capillary 139 (H) 65 - 99 mg/dL  Glucose, capillary     Status: Abnormal   Collection Time: 11/04/16  4:26 PM  Result Value Ref Range   Glucose-Capillary 135 (H) 65 - 99 mg/dL  Glucose, capillary     Status: Abnormal   Collection Time: 11/04/16  7:39 PM  Result Value Ref Range   Glucose-Capillary 101 (H) 65 - 99 mg/dL  Glucose, capillary     Status: Abnormal   Collection Time: 11/05/16 12:07 AM  Result Value Ref Range   Glucose-Capillary 137 (H) 65 - 99 mg/dL   Comment 1 Notify RN   Glucose, capillary     Status: Abnormal   Collection Time: 11/05/16  3:58 AM  Result Value Ref Range   Glucose-Capillary 128 (H) 65 - 99 mg/dL   Comment 1 Notify RN   Glucose, capillary     Status: Abnormal   Collection Time: 11/05/16  8:19 AM  Result Value Ref Range   Glucose-Capillary 118 (H) 65 - 99 mg/dL  Glucose, capillary     Status: Abnormal   Collection Time: 11/05/16 12:07 PM  Result Value Ref Range   Glucose-Capillary 111 (H) 65 - 99 mg/dL  Glucose, capillary     Status: Abnormal   Collection Time: 11/05/16  4:26 PM  Result Value Ref Range   Glucose-Capillary 135 (H) 65 - 99 mg/dL  Glucose, capillary     Status: Abnormal   Collection Time: 11/05/16  7:28 PM  Result Value Ref Range   Glucose-Capillary 119 (H) 65 - 99 mg/dL  Glucose, capillary     Status: Abnormal   Collection Time: 11/05/16 11:49 PM  Result Value Ref Range    Glucose-Capillary 133 (H) 65 - 99 mg/dL   Comment 1 Notify RN   Glucose, capillary     Status: Abnormal   Collection Time: 11/06/16  3:59 AM  Result Value Ref Range   Glucose-Capillary 146 (H) 65 - 99 mg/dL  Glucose, capillary     Status: Abnormal   Collection Time: 11/06/16  7:51 AM  Result Value Ref Range   Glucose-Capillary 135 (H) 65 - 99 mg/dL  Glucose, capillary     Status: Abnormal   Collection Time: 11/06/16  8:14 AM  Result Value Ref Range   Glucose-Capillary 150 (H) 65 - 99 mg/dL  Glucose, capillary     Status: Abnormal   Collection Time: 11/06/16 11:55 AM  Result Value Ref Range   Glucose-Capillary 140 (H) 65 - 99 mg/dL  Glucose, capillary     Status: Abnormal   Collection Time: 11/06/16  5:17 PM  Result Value Ref Range   Glucose-Capillary 103 (H) 65 - 99 mg/dL  Glucose, capillary     Status: None   Collection Time: 11/06/16  5:46 PM  Result Value Ref Range   Glucose-Capillary 92 65 - 99 mg/dL  Glucose, capillary     Status: Abnormal   Collection Time: 11/06/16  8:06 PM  Result Value Ref Range   Glucose-Capillary 141 (H) 65 - 99 mg/dL  Glucose, capillary     Status: Abnormal   Collection Time: 11/06/16 11:56 PM  Result Value Ref Range   Glucose-Capillary 144 (H) 65 - 99 mg/dL   Comment 1 Notify RN   Glucose, capillary     Status: Abnormal   Collection Time: 11/07/16  4:18 AM  Result Value Ref Range   Glucose-Capillary 131 (H) 65 - 99 mg/dL   Comment 1 Notify RN   Glucose, capillary     Status: Abnormal   Collection Time: 11/07/16  7:34 AM  Result Value Ref Range   Glucose-Capillary 159 (H) 65 - 99 mg/dL     HEENT: normal Cardio:.RRR without murmur. No JVD  Resp: CTA Bilaterally without wheezes or rales. Normal effort   GI: BS positive and Nontender and nondistended Extremity:  No Edema Skin:   Intact Neuro: Alert/Oriented and Other Motor strength is 5/5 bilateral deltoid bicep tricep grip hip flexor and extensor ankle dorsiflexor, no dysmetria on  finger-nose-finger testing. No nystagmus Musc/Skel:  Other No pain with upper limb or lower limb range of motion General no acute distress   Assessment/Plan: 1. 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 Physiatrist is providing close team supervision and 24 hour management of active medical problems listed below. Physiatrist and rehab team continue to assess barriers to discharge/monitor patient progress toward functional and medical goals. FIM: Function - Bathing Position: Shower Body parts bathed by patient: Right arm, Left arm, Chest, Abdomen, Front perineal area, Buttocks, Right upper leg, Left upper leg, Left lower leg, Right lower leg, Back Body parts bathed by helper: Back Assist Level: Supervision or verbal cues  Function- Upper Body Dressing/Undressing What is the patient wearing?: Pull over shirt/dress, Bra Bra - Perfomed by patient: Thread/unthread right bra strap, Thread/unthread left bra strap, Hook/unhook bra (pull down sports bra) Pull over shirt/dress - Perfomed by patient: Thread/unthread right sleeve, Thread/unthread left sleeve, Put head through opening, Pull shirt over trunk Assist Level: More than reasonable time Function - Lower Body Dressing/Undressing What is the patient wearing?: Underwear, Shoes, Pants Position: Other (comment) (recliner) Underwear - Performed by patient: Thread/unthread right underwear leg, Thread/unthread left underwear leg, Pull underwear up/down Pants- Performed by patient: Thread/unthread right pants leg, Thread/unthread left pants leg, Pull pants up/down, Fasten/unfasten pants Non-skid slipper socks- Performed by patient: Don/doff right sock, Don/doff left sock Shoes - Performed by patient: Don/doff right shoe, Don/doff left shoe Assist for footwear: Setup Assist for lower  body dressing: Supervision or verbal cues  Function - Toileting Toileting steps completed by patient: Adjust clothing prior to toileting, Performs perineal hygiene, Adjust clothing after toileting Assist level: Supervision or verbal cues  Function Programmer, multimedia transfer assistive device: Grab bar Assist level to toilet: Supervision or verbal cues Assist level from toilet: Supervision or verbal cues  Function - Chair/bed transfer Chair/bed transfer method: Stand pivot Chair/bed transfer assist level: Supervision or verbal cues Chair/bed transfer assistive device: Armrests Chair/bed transfer details: Tactile cues for posture, Verbal cues for precautions/safety  Function - Locomotion: Wheelchair Will patient use wheelchair at discharge?: No Wheelchair activity did not occur: N/A Wheel 50 feet with 2 turns activity did not occur: N/A Wheel 150 feet activity did not occur: N/A Function - Locomotion: Ambulation Assistive device: No device Max distance: 247ft  Assist level: Moderate assist (Pt 50 - 74%) Assist level: Touching or steadying assistance (Pt > 75%) Assist level: Moderate assist (Pt 50 - 74%) Walk 150 feet activity did not occur: Safety/medical concerns Assist level: Moderate assist (Pt 50 - 74%) Assist level: Touching or steadying assistance (Pt > 75%)  Function - Comprehension Comprehension: Auditory Comprehension assist level: Follows complex conversation/direction with extra time/assistive device  Function - Expression Expression: Verbal Expression assist level: Expresses complex ideas: With extra time/assistive device  Function - Social Interaction Social Interaction assist level: Interacts appropriately with others with medication or extra time (anti-anxiety, antidepressant).  Function - Problem Solving Problem solving assist level: Solves complex problems: With extra time  Function - Memory Memory assist level: More than reasonable amount of  time Patient normally able to recall (first 3 days only): Current season, Staff names and faces, That he or she is in a hospital, Location of own room  Medical Problem List and Plan: 1.  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 Recommendations of 30 day event monitor as outpatient             -CIR PT, OT,     2.  DVT Prophylaxis/Anticoagulation: Subcutaneous heparin.--dc, encourage ambulation 3. Pain Management: Tylenol as needed 4. Mood: Zoloft 50 mg daily 5. Neuropsych: This patient is capable of making decisions on her own behalf. 6. Skin/Wound Care: Routine skin checks 7. Fluids/Electrolytes/Nutrition: Routine I&O with follow-up chemistries 8. Dysphagia. Patient with tube feeds   -slow progress. Repeat MBS next week?  -tolerating NGT without issues at this time  -discussed with her the fact that we will likely need to discuss options for nutrition next week (PEG) 9. Hypertension. Permissive hypertension. Monitor of increased mobility. Patient on Hyzaar 100-12 0.5 mg daily prior to admission. bp's controlled at present Vitals:   11/06/16 2149 11/07/16 0310  BP: (!) 145/73 (!) 154/73  Pulse: 88 92  Resp:  18  Temp:  97.7 F (36.5 C)  SpO2:  97%   10. Hyperlipidemia. Lipitor 11. Hypokalemia. 3.4 on 10/24    LOS (Days) 4 A FACE TO FACE EVALUATION WAS PERFORMED  SWARTZ,ZACHARY T 11/07/2016, 9:38 AM

## 2016-11-07 NOTE — Progress Notes (Signed)
Physical Therapy Session Note  Patient Details  Name: [name] MRN: [id number] Date of Birth: 1940/11/23  Today's Date: 11/07/2016 PT Individual Time: 0800-0915 PT Individual Time Calculation (min): 75 min   Short Term Goals: Week 1:  PT Short Term Goal 1 (Week 1): STG=LTG  Skilled Therapeutic Interventions/Progress Updates:    Pt received sitting in WC and agreeable to PT. Gait training in hall with RW x 1105f and supervision assist from PT.   PT instructed Pt in dynamic balance training standing on Airex pad with larteral and cross body Reaches R and L. 2 x 12. Standing on blue wedge while playing connect four. Pt initially required 1 UE support and min assist progressing to no UE support on supervision assist from PT. Min cues for improved use of ankle strategy to prevent posterior LOB.   Pt instructed in [place] level A and B balance exercises. BUE support on: LAQ, hip abduction, HS curl, sit<>stand without UE support. No AD or UE support: Forward backward walking, figure 8. Side stepping, tandem stance. Supervision assist with level A exercises, and min assist with level B exercises. Min cues for improved L gluteal activation to stabilize the pelvis and improved trunk control.   Gait training back to the room x 1542fwith min-mod assist from PT with occasional LOB to the R. Poor use of hip or ankle strategy to prevent LOB. Cues for posture and pelvic control from PT with only mild improvement noted.   [facility] and [facility] reach and all needs met.            Therapy Documentation Precautions:  Precautions Precautions: Fall Restrictions Weight Bearing Restrictions: No Pain: 0/10   See Function Navigator for Current Functional Status.   Therapy/Group: Individual Therapy  AuLorie Phenix0/27/2018, 9:19 AM

## 2016-11-07 NOTE — Progress Notes (Signed)
  Occupational Therapy Session Note  Patient Details  Name: [name] MRN: [id number] Date of Birth: 10/06/1940  Today's Date: 11/07/2016 OT Individual Time: 1110-1157 OT Individual Time Calculation (min): 47 min and 42 min   Short Term Goals: Week 1:  OT Short Term Goal 1 (Week 1): LTg=STG  Skilled Therapeutic Interventions/Progress Updates:    1:1. Pt declining bathing at this time reporting already did it at sink level this morning. No c/o pain. Pt propels w/c for BUE strength and general conditioning to gift shop from room with VC for steering technique and path-finding. Pt ambulates around gift shop reaching for items at various level with CGA- supervision and VC to keep head forward. Pt ambulates back to dayroom without Vc for path-finding with min guard and 1 LOB that pt able to correct without A. Pt stands on blue foam pad to challenge balance while painting on easel for RUE strength/FMC. Exited session with pt seated in w/c with call light in reach and all needs met.   Session2 (45 min): Pt with no c/o pain. Pt ambulates without AD with MIN A for balance, VC for looking ahead and foot clearance. Pt stands on blue foam pad for various activities (horse shoe toss, and chest/overhead passes to trampoline with 1#wrist weights) with up to MIN A for balance with 3 LOB laterally in B directions. Pt completes 3x10 sit to stand without AD with 4" block under LLE for increased glute/quad strengthening with CGA fading to supervision. Exited session with pt seated in recliner with call light in reach and all needs met.  Therapy Documentation Precautions:  Precautions Precautions: Fall Restrictions Weight Bearing Restrictions: No  See Function Navigator for Current Functional Status.   Therapy/Group: Individual Therapy    [name] 11/07/2016, 11:59 AM

## 2016-11-08 ENCOUNTER — Inpatient Hospital Stay (HOSPITAL_COMMUNITY): Payer: Medicare Other | Admitting: Speech Pathology

## 2016-11-08 LAB — GLUCOSE, CAPILLARY
GLUCOSE-CAPILLARY: 114 mg/dL — AB (ref 65–99)
GLUCOSE-CAPILLARY: 120 mg/dL — AB (ref 65–99)
GLUCOSE-CAPILLARY: 151 mg/dL — AB (ref 65–99)
GLUCOSE-CAPILLARY: 152 mg/dL — AB (ref 65–99)
Glucose-Capillary: 126 mg/dL — ABNORMAL HIGH (ref 65–99)
Glucose-Capillary: 81 mg/dL (ref 65–99)

## 2016-11-08 NOTE — Progress Notes (Signed)
Subjective/Complaints: Up in chair. No new complaints. Comfortable  ROS: pt denies nausea, vomiting, diarrhea, cough, shortness of breath or chest pain   Objective: Vital Signs: Blood pressure 137/66, pulse 75, temperature 98 F (36.7 C), temperature source Oral, resp. rate 18, weight 60 kg (132 lb 4.8 oz), SpO2 94 %. No results found. Results for orders placed or performed during the hospital encounter of 11/03/16 (from the past 72 hour(s))  Glucose, capillary     Status: Abnormal   Collection Time: 11/05/16  4:26 PM  Result Value Ref Range   Glucose-Capillary 135 (H) 65 - 99 mg/dL  Glucose, capillary     Status: Abnormal   Collection Time: 11/05/16  7:28 PM  Result Value Ref Range   Glucose-Capillary 119 (H) 65 - 99 mg/dL  Glucose, capillary     Status: Abnormal   Collection Time: 11/05/16 11:49 PM  Result Value Ref Range   Glucose-Capillary 133 (H) 65 - 99 mg/dL   Comment 1 Notify RN   Glucose, capillary     Status: Abnormal   Collection Time: 11/06/16  3:59 AM  Result Value Ref Range   Glucose-Capillary 146 (H) 65 - 99 mg/dL  Glucose, capillary     Status: Abnormal   Collection Time: 11/06/16  7:51 AM  Result Value Ref Range   Glucose-Capillary 135 (H) 65 - 99 mg/dL  Glucose, capillary     Status: Abnormal   Collection Time: 11/06/16  8:14 AM  Result Value Ref Range   Glucose-Capillary 150 (H) 65 - 99 mg/dL  Glucose, capillary     Status: Abnormal   Collection Time: 11/06/16 11:55 AM  Result Value Ref Range   Glucose-Capillary 140 (H) 65 - 99 mg/dL  Glucose, capillary     Status: Abnormal   Collection Time: 11/06/16  5:17 PM  Result Value Ref Range   Glucose-Capillary 103 (H) 65 - 99 mg/dL  Glucose, capillary     Status: None   Collection Time: 11/06/16  5:46 PM  Result Value Ref Range   Glucose-Capillary 92 65 - 99 mg/dL  Glucose, capillary     Status: Abnormal   Collection Time: 11/06/16  8:06 PM  Result Value Ref Range   Glucose-Capillary 141 (H) 65 - 99  mg/dL  Glucose, capillary     Status: Abnormal   Collection Time: 11/06/16 11:56 PM  Result Value Ref Range   Glucose-Capillary 144 (H) 65 - 99 mg/dL   Comment 1 Notify RN   Glucose, capillary     Status: Abnormal   Collection Time: 11/07/16  4:18 AM  Result Value Ref Range   Glucose-Capillary 131 (H) 65 - 99 mg/dL   Comment 1 Notify RN   Glucose, capillary     Status: Abnormal   Collection Time: 11/07/16  7:34 AM  Result Value Ref Range   Glucose-Capillary 159 (H) 65 - 99 mg/dL  Glucose, capillary     Status: Abnormal   Collection Time: 11/07/16 12:08 PM  Result Value Ref Range   Glucose-Capillary 109 (H) 65 - 99 mg/dL  Glucose, capillary     Status: Abnormal   Collection Time: 11/07/16  4:42 PM  Result Value Ref Range   Glucose-Capillary 117 (H) 65 - 99 mg/dL  Glucose, capillary     Status: Abnormal   Collection Time: 11/07/16  7:54 PM  Result Value Ref Range   Glucose-Capillary 110 (H) 65 - 99 mg/dL   Comment 1 Notify RN   Glucose, capillary  Status: Abnormal   Collection Time: 11/07/16 11:59 PM  Result Value Ref Range   Glucose-Capillary 157 (H) 65 - 99 mg/dL   Comment 1 Notify RN   Glucose, capillary     Status: Abnormal   Collection Time: 11/08/16  3:57 AM  Result Value Ref Range   Glucose-Capillary 126 (H) 65 - 99 mg/dL   Comment 1 Notify RN   Glucose, capillary     Status: Abnormal   Collection Time: 11/08/16  8:02 AM  Result Value Ref Range   Glucose-Capillary 152 (H) 65 - 99 mg/dL  Glucose, capillary     Status: None   Collection Time: 11/08/16 12:17 PM  Result Value Ref Range   Glucose-Capillary 81 65 - 99 mg/dL     HEENT: normal Cardio:.RRR without murmur. No JVD  Resp: CTA Bilaterally without wheezes or rales. Normal effort   GI: BS positive and Nontender and nondistended Extremity:  No Edema Skin:   Intact Neuro: Alert/Oriented and Other Motor strength is 5/5 bilateral deltoid bicep tricep grip hip flexor and extensor ankle dorsiflexor, no  dysmetria on finger-nose-finger testing. No nystagmus Musc/Skel:  Other No pain with upper limb or lower limb range of motion General no acute distress   Assessment/Plan: 1. 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 Physiatrist is providing close team supervision and 24 hour management of active medical problems listed below. Physiatrist and rehab team continue to assess barriers to discharge/monitor patient progress toward functional and medical goals. FIM: Function - Bathing Position: Shower Body parts bathed by patient: Right arm, Left arm, Chest, Abdomen, Front perineal area, Buttocks, Right upper leg, Left upper leg, Left lower leg, Right lower leg, Back Body parts bathed by helper: Back Assist Level: Supervision or verbal cues  Function- Upper Body Dressing/Undressing What is the patient wearing?: Pull over shirt/dress, Bra Bra - Perfomed by patient: Thread/unthread right bra strap, Thread/unthread left bra strap, Hook/unhook bra (pull down sports bra) Pull over shirt/dress - Perfomed by patient: Thread/unthread right sleeve, Thread/unthread left sleeve, Put head through opening, Pull shirt over trunk Assist Level: More than reasonable time Function - Lower Body Dressing/Undressing What is the patient wearing?: Underwear, Shoes, Pants Position: Other (comment) (recliner) Underwear - Performed by patient: Thread/unthread right underwear leg, Thread/unthread left underwear leg, Pull underwear up/down Pants- Performed by patient: Thread/unthread right pants leg, Thread/unthread left pants leg, Pull pants up/down, Fasten/unfasten pants Non-skid slipper socks- Performed by patient: Don/doff right sock, Don/doff left sock Shoes - Performed by patient: Don/doff right shoe, Don/doff left shoe Assist for footwear:  Setup Assist for lower body dressing: Supervision or verbal cues  Function - Toileting Toileting steps completed by patient: Adjust clothing prior to toileting, Performs perineal hygiene, Adjust clothing after toileting Assist level: Supervision or verbal cues  Function Programmer, multimedia transfer assistive device: Grab bar Assist level to toilet: Supervision or verbal cues Assist level from toilet: Supervision or verbal cues  Function - Chair/bed transfer Chair/bed transfer method: Stand pivot Chair/bed transfer assist level: Supervision or verbal cues Chair/bed transfer assistive device: Armrests Chair/bed transfer details: Tactile cues for posture, Verbal cues for precautions/safety  Function - Locomotion: Wheelchair Will patient use wheelchair at discharge?: No Wheelchair activity did not occur: N/A Wheel 50 feet with 2 turns activity did not occur: N/A Wheel 150 feet activity did not occur: N/A Function - Locomotion: Health visitor  device: No device Max distance: 27900ft  Assist level: Moderate assist (Pt 50 - 74%) Assist level: Touching or steadying assistance (Pt > 75%) Assist level: Moderate assist (Pt 50 - 74%) Walk 150 feet activity did not occur: Safety/medical concerns Assist level: Moderate assist (Pt 50 - 74%) Assist level: Touching or steadying assistance (Pt > 75%)  Function - Comprehension Comprehension: Auditory Comprehension assist level: Follows complex conversation/direction with extra time/assistive device  Function - Expression Expression: Verbal Expression assist level: Expresses complex ideas: With extra time/assistive device  Function - Social Interaction Social Interaction assist level: Interacts appropriately with others with medication or extra time (anti-anxiety, antidepressant).  Function - Problem Solving Problem solving assist level: Solves complex problems: With extra time  Function - Memory Memory assist level: More than  reasonable amount of time Patient normally able to recall (first 3 days only): Current season, Staff names and faces, That he or she is in a hospital, Location of own room  Medical Problem List and Plan: 1.  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 Recommendations of 30 day event monitor as outpatient             -CIR PT, OT, SLP    2.  DVT Prophylaxis/Anticoagulation: Subcutaneous heparin.--dc, encourage ambulation 3. Pain Management: Tylenol as needed 4. Mood: Zoloft 50 mg daily 5. Neuropsych: This patient is capable of making decisions on her own behalf. 6. Skin/Wound Care: Routine skin checks 7. Fluids/Electrolytes/Nutrition: tolerating TF 8. Dysphagia. Patient with tube feeds   -  Repeat MBS 10/29  -tolerating NGT without issues at this time  -discussed with her the fact that we will discuss plan based on results of MBS tomorrow. She understands situation 9. Hypertension. Permissive hypertension. Monitor of increased mobility. Patient on Hyzaar 100-12 0.5 mg daily prior to admission. bp's controlled at present Vitals:   11/07/16 1505 11/08/16 0401  BP: 135/71 137/66  Pulse: 97 75  Resp: 18 18  Temp: 97.7 F (36.5 C) 98 F (36.7 C)  SpO2: 99% 94%   10. Hyperlipidemia. Lipitor 11. Hypokalemia. 3.4 on 10/24    LOS (Days) 5 A FACE TO FACE EVALUATION WAS PERFORMED  SWARTZ,ZACHARY T 11/08/2016, 12:25 PM

## 2016-11-09 ENCOUNTER — Inpatient Hospital Stay (HOSPITAL_COMMUNITY): Payer: Medicare Other | Admitting: Occupational Therapy

## 2016-11-09 ENCOUNTER — Inpatient Hospital Stay (HOSPITAL_COMMUNITY): Payer: Medicare Other

## 2016-11-09 ENCOUNTER — Inpatient Hospital Stay (HOSPITAL_COMMUNITY): Payer: Medicare Other | Admitting: Physical Therapy

## 2016-11-09 ENCOUNTER — Inpatient Hospital Stay (HOSPITAL_COMMUNITY): Payer: Medicare Other | Admitting: Speech Pathology

## 2016-11-09 DIAGNOSIS — I69391 Dysphagia following cerebral infarction: Secondary | ICD-10-CM

## 2016-11-09 LAB — GLUCOSE, CAPILLARY
GLUCOSE-CAPILLARY: 131 mg/dL — AB (ref 65–99)
GLUCOSE-CAPILLARY: 99 mg/dL (ref 65–99)
Glucose-Capillary: 124 mg/dL — ABNORMAL HIGH (ref 65–99)
Glucose-Capillary: 122 mg/dL — ABNORMAL HIGH (ref 65–99)
Glucose-Capillary: 86 mg/dL (ref 65–99)

## 2016-11-09 LAB — BASIC METABOLIC PANEL
ANION GAP: 10 (ref 5–15)
BUN: 44 mg/dL — AB (ref 6–20)
CO2: 29 mmol/L (ref 22–32)
Calcium: 9.7 mg/dL (ref 8.9–10.3)
Chloride: 106 mmol/L (ref 101–111)
Creatinine, Ser: 0.8 mg/dL (ref 0.44–1.00)
Glucose, Bld: 121 mg/dL — ABNORMAL HIGH (ref 65–99)
POTASSIUM: 4.2 mmol/L (ref 3.5–5.1)
SODIUM: 145 mmol/L (ref 135–145)

## 2016-11-09 NOTE — Progress Notes (Signed)
Physical Therapy Session Note  Patient Details  Name: [name] MRN: [id number] Date of Birth: 22-Oct-1940  Today's Date: 11/09/2016 PT Individual Time: 0900-1000 PT Individual Time Calculation (min): 60 min   Short Term Goals: Week 1:  PT Short Term Goal 1 (Week 1): STG=LTG  Skilled Therapeutic Interventions/Progress Updates: Pt presented in recliner agreeable to therapy. Pt ambulated to rehab gym min guard with RW with intermittent cues for staying inside RW as noted drift to L. Pt participated in standing static and dynamic balance activities on Airex with pt requiring minA to maintain balance. Pt occasionally reaching for RW or chair however pt improved with cues and was able to use ankle strategy to correct balance. Performed horse shoes with x1LE on step with min guard. Pt transported to [facility] gym and performed Dynavision mode B with 3sec changewith BLE support. Pt able to maintain fair balance with dynamic activity and hit targets without LOB. Pt transported back to room and performed stand pivot back to recliner with all current needs met.        Therapy Documentation Precautions:  Precautions Precautions: Fall Restrictions Weight Bearing Restrictions: No   See Function Navigator for Current Functional Status.   Therapy/Group: Individual Therapy  Amor Hyle  Hesper Venturella, PTA  11/09/2016, 4:21 PM

## 2016-11-09 NOTE — Progress Notes (Signed)
Occupational Therapy Session Note  Patient Details  Name: [name] MRN: [id number] Date of Birth: 04/26/1940  Today's Date: 11/09/2016 OT Individual Time: 1301-1416 OT Individual Time Calculation (min): 75 min    Short Term Goals: Week 1:  OT Short Term Goal 1 (Week 1): LTg=STG  Skilled Therapeutic Interventions/Progress Updates:    Focus of session on activity tolerance, dynamic standing balance, and functional mobility/transfers. Pt self propelled w/c to dayroom where Pt engaged in Nustep for 10 min for engaging reciprocal movement and increased activity tolerance. Pt completed biodex activity utilizing bil UE support and minA-MinGuard during completion of maze and 'limits of stability' activities, with highest score of 53% accuracy during limits of stability. Pt then self-propelled w/c for increased endurance and bil UE/LE strengthening to ortho gym where Pt engaged in wii activity with focus on static/dynamic standing balance during activity completion. Pt required close minGuard and intermittent MinA during activity completion (wii bowling) - intermittent seated rest breaks between each round. Pt returned to room in manner described above, ambulating from outside of room into bathroom with Min HHA, completing toileting with MinA during standing components of task completion. Pt left seated in recliner, call bell and needs within reach.   Therapy Documentation Precautions:  Precautions Precautions: Fall Restrictions Weight Bearing Restrictions: No   Pain: Pain Assessment Pain Assessment: No/denies pain ADL: ADL ADL Comments: see functional navigator  See Function Navigator for Current Functional Status.   Therapy/Group: Individual Therapy  Orlando PennerBreanna L Deauna Yaw 11/09/2016, 4:13 PM

## 2016-11-09 NOTE — Progress Notes (Signed)
Alert and coperative , remains NPO throughout shift toleates Jevity 1,5 @ 55cc/hr and 200cc H20 w/o difficulty via pum, nasal area left nare no signs of skin breakage, irritation, bruiting to nare area. Plan MBS today,. Ambulates with 1 assistance and toleares well. Monitor ad assist, call bell within reach, bed alarm on for safety.

## 2016-11-09 NOTE — Progress Notes (Signed)
Subjective/Complaints:  Coughing but no SOB, discussed MBS and possible PEG if no improvement, she is not opposed if needed  ROS: pt denies nausea, vomiting, diarrhea, cough, shortness of breath or chest pain   Objective: Vital Signs: Blood pressure (!) 144/73, pulse 86, temperature 97.9 F (36.6 C), temperature source Oral, resp. rate 18, weight 59.5 kg (131 lb 2.8 oz), SpO2 97 %. No results found. Results for orders placed or performed during the hospital encounter of 11/03/16 (from the past 72 hour(s))  Glucose, capillary     Status: Abnormal   Collection Time: 11/06/16 11:55 AM  Result Value Ref Range   Glucose-Capillary 140 (H) 65 - 99 mg/dL  Glucose, capillary     Status: Abnormal   Collection Time: 11/06/16  5:17 PM  Result Value Ref Range   Glucose-Capillary 103 (H) 65 - 99 mg/dL  Glucose, capillary     Status: None   Collection Time: 11/06/16  5:46 PM  Result Value Ref Range   Glucose-Capillary 92 65 - 99 mg/dL  Glucose, capillary     Status: Abnormal   Collection Time: 11/06/16  8:06 PM  Result Value Ref Range   Glucose-Capillary 141 (H) 65 - 99 mg/dL  Glucose, capillary     Status: Abnormal   Collection Time: 11/06/16 11:56 PM  Result Value Ref Range   Glucose-Capillary 144 (H) 65 - 99 mg/dL   Comment 1 Notify RN   Glucose, capillary     Status: Abnormal   Collection Time: 11/07/16  4:18 AM  Result Value Ref Range   Glucose-Capillary 131 (H) 65 - 99 mg/dL   Comment 1 Notify RN   Glucose, capillary     Status: Abnormal   Collection Time: 11/07/16  7:34 AM  Result Value Ref Range   Glucose-Capillary 159 (H) 65 - 99 mg/dL  Glucose, capillary     Status: Abnormal   Collection Time: 11/07/16 12:08 PM  Result Value Ref Range   Glucose-Capillary 109 (H) 65 - 99 mg/dL  Glucose, capillary     Status: Abnormal   Collection Time: 11/07/16  4:42 PM  Result Value Ref Range   Glucose-Capillary 117 (H) 65 - 99 mg/dL  Glucose, capillary     Status: Abnormal   Collection Time: 11/07/16  7:54 PM  Result Value Ref Range   Glucose-Capillary 110 (H) 65 - 99 mg/dL   Comment 1 Notify RN   Glucose, capillary     Status: Abnormal   Collection Time: 11/07/16 11:59 PM  Result Value Ref Range   Glucose-Capillary 157 (H) 65 - 99 mg/dL   Comment 1 Notify RN   Glucose, capillary     Status: Abnormal   Collection Time: 11/08/16  3:57 AM  Result Value Ref Range   Glucose-Capillary 126 (H) 65 - 99 mg/dL   Comment 1 Notify RN   Glucose, capillary     Status: Abnormal   Collection Time: 11/08/16  8:02 AM  Result Value Ref Range   Glucose-Capillary 152 (H) 65 - 99 mg/dL  Glucose, capillary     Status: None   Collection Time: 11/08/16 12:17 PM  Result Value Ref Range   Glucose-Capillary 81 65 - 99 mg/dL  Glucose, capillary     Status: Abnormal   Collection Time: 11/08/16  4:06 PM  Result Value Ref Range   Glucose-Capillary 114 (H) 65 - 99 mg/dL  Glucose, capillary     Status: Abnormal   Collection Time: 11/08/16  8:07 PM  Result Value Ref  Range   Glucose-Capillary 120 (H) 65 - 99 mg/dL   Comment 1 Notify RN   Glucose, capillary     Status: Abnormal   Collection Time: 11/09/16 12:01 AM  Result Value Ref Range   Glucose-Capillary 151 (H) 65 - 99 mg/dL   Comment 1 Notify RN   Glucose, capillary     Status: Abnormal   Collection Time: 11/09/16  4:29 AM  Result Value Ref Range   Glucose-Capillary 131 (H) 65 - 99 mg/dL   Comment 1 Notify RN   Glucose, capillary     Status: Abnormal   Collection Time: 11/09/16  7:58 AM  Result Value Ref Range   Glucose-Capillary 124 (H) 65 - 99 mg/dL     HEENT: normal Cardio:.RRR without murmur. No JVD  Resp: CTA Bilaterally without wheezes or rales. Normal effort   GI: BS positive and Nontender and nondistended Extremity:  No Edema Skin:   Intact Neuro: Alert/Oriented and Other Motor strength is 5/5 bilateral deltoid bicep tricep grip hip flexor and extensor ankle dorsiflexor, no dysmetria on finger-nose-finger  testing. No nystagmus Musc/Skel:  Other No pain with upper limb or lower limb range of motion General no acute distress   Assessment/Plan: 1. 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 Physiatrist is providing close team supervision and 24 hour management of active medical problems listed below. Physiatrist and rehab team continue to assess barriers to discharge/monitor patient progress toward functional and medical goals. FIM: Function - Bathing Position: Shower Body parts bathed by patient: Right arm, Left arm, Chest, Abdomen, Front perineal area, Buttocks, Right upper leg, Left upper leg, Left lower leg, Right lower leg, Back Body parts bathed by helper: Back Assist Level: Supervision or verbal cues  Function- Upper Body Dressing/Undressing What is the patient wearing?: Pull over shirt/dress, Bra Bra - Perfomed by patient: Thread/unthread right bra strap, Thread/unthread left bra strap, Hook/unhook bra (pull down sports bra) Pull over shirt/dress - Perfomed by patient: Thread/unthread right sleeve, Thread/unthread left sleeve, Put head through opening, Pull shirt over trunk Assist Level: More than reasonable time Function - Lower Body Dressing/Undressing What is the patient wearing?: Underwear, Shoes, Pants Position: Other (comment) (recliner) Underwear - Performed by patient: Thread/unthread right underwear leg, Thread/unthread left underwear leg, Pull underwear up/down Pants- Performed by patient: Thread/unthread right pants leg, Thread/unthread left pants leg, Pull pants up/down, Fasten/unfasten pants Non-skid slipper socks- Performed by patient: Don/doff right sock, Don/doff left sock Shoes - Performed by patient: Don/doff right shoe, Don/doff left shoe Assist for footwear: Setup Assist for lower body dressing:  Supervision or verbal cues  Function - Toileting Toileting steps completed by patient: Adjust clothing prior to toileting, Performs perineal hygiene, Adjust clothing after toileting Assist level: Supervision or verbal cues  Function Programmer, multimedia transfer assistive device: Grab bar Assist level to toilet: Supervision or verbal cues Assist level from toilet: Supervision or verbal cues  Function - Chair/bed transfer Chair/bed transfer method: Stand pivot Chair/bed transfer assist level: Supervision or verbal cues Chair/bed transfer assistive device: Armrests Chair/bed transfer details: Tactile cues for posture, Verbal cues for precautions/safety  Function - Locomotion: Wheelchair Will patient use wheelchair at discharge?: No Wheelchair activity did not occur: N/A Wheel 50 feet with 2 turns activity did not occur: N/A Wheel 150 feet activity did not occur: N/A Function - Locomotion: Ambulation Assistive device: No device Max distance:  2200ft  Assist level: Moderate assist (Pt 50 - 74%) Assist level: Touching or steadying assistance (Pt > 75%) Assist level: Moderate assist (Pt 50 - 74%) Walk 150 feet activity did not occur: Safety/medical concerns Assist level: Moderate assist (Pt 50 - 74%) Assist level: Touching or steadying assistance (Pt > 75%)  Function - Comprehension Comprehension: Auditory Comprehension assist level: Follows complex conversation/direction with extra time/assistive device  Function - Expression Expression: Verbal Expression assist level: Expresses complex ideas: With extra time/assistive device  Function - Social Interaction Social Interaction assist level: Interacts appropriately with others with medication or extra time (anti-anxiety, antidepressant).  Function - Problem Solving Problem solving assist level: Solves complex problems: With extra time  Function - Memory Memory assist level: More than reasonable amount of time Patient  normally able to recall (first 3 days only): Current season, Staff names and faces, That he or she is in a hospital, Location of own room  Medical Problem List and Plan: 1.  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 Recommendations of 30 day event monitor as outpatient             -CIR PT, OT, SLP    2.  DVT Prophylaxis/Anticoagulation: Subcutaneous heparin.--dc, encourage ambulation 3. Pain Management: Tylenol as needed 4. Mood: Zoloft 50 mg daily 5. Neuropsych: This patient is capable of making decisions on her own behalf. 6. Skin/Wound Care: Routine skin checks 7. Fluids/Electrolytes/Nutrition: tolerating TF 8. Dysphagia. Patient with tube feeds   -  Repeat MBS 10/29  -tolerating NGT without issues at this time  -discussed with her the fact that we will discuss plan based on results of MBS tomorrow. PEG if no improvement 9. Hypertension. Permissive hypertension. Monitor of increased mobility. Patient on Hyzaar 100-12 0.5 mg daily prior to admission. bp's controlled at present Vitals:   11/08/16 2149 11/09/16 0400  BP: (!) 141/69 (!) 144/73  Pulse: 87 86  Resp:  18  Temp:  97.9 F (36.6 C)  SpO2: 96% 97%   10. Hyperlipidemia. Lipitor 11. Hypokalemia. 3.4 on 10/24    LOS (Days) 6 A FACE TO FACE EVALUATION WAS PERFORMED  Tavone Caesar E 11/09/2016, 8:40 AM

## 2016-11-09 NOTE — Progress Notes (Signed)
Modified Barium Swallow Progress Note  Patient Details  Name: [name] MRN: [id number] Date of Birth: 09/22/1940  Today's Date: 11/09/2016  Modified Barium Swallow completed.  Full report located under Chart Review in the Imaging Section.  Brief recommendations include the following:  Clinical Impression  Pt continues to present with moderate to severe pharyngeal and cervical esophageal dysphagia. Of note, Cortrak in place and suspected osteophytes most prominent at C5-C6. Pt continues to decreased anterior excurison of hyoid, no epiglotic inversaion and reduce pharyngeal movement all of which continue to prevent effective airway closure. Pt with penetration before and during swallow with nectar thick liquids and thin liquids. However when prompted to cough and reswallow pt was able to clear residuals in larnygeal vestibule as well as most of phayrngeal residue - however over time, pt with deeper penetration to level of vocal cords and inability to clear. This strategy was not effective in clearing nectar thick liquid residuals and pt required oral suctioning for removal of residue. Pt with no movement of puree bolus through pharynx which led to aspiration during and after swallow of puree. After swallow pt exhibited upward movement of bolus indicated of decreased UES opening. Recommend remain NPO except for a few ice chips or sips of water after oral care. Pt continues at very high aspiration risk of aspriation and inability to mainatin nurtitional status orally. Recommend continued alternate means of nutrition. Cotninue intensive SLP /fu for swallowing exercises and therapuetic trials.    Swallow Evaluation Recommendations       SLP Diet Recommendations: NPO;Ice chips PRN after oral care;Alternative means - long-term       Medication Administration: Via alternative means               Oral Care Recommendations: Oral care QID   Other Recommendations: Have oral suction  available    Kieron Kantner 11/09/2016,11:27 AM

## 2016-11-09 NOTE — Progress Notes (Signed)
Speech Language Pathology Daily Session Note  Patient Details  Name: [name] MRN: [id number] Date of Birth: 03/05/1940  Today's Date: 11/09/2016 SLP Individual Time: 1045-1100 SLP Individual Time Calculation (min): 15 min  Short Term Goals: Week 1: SLP Short Term Goal 1 (Week 1): Pt will demonstrate readiness for repeat MBS as evidenced by minimal overt s/s of aspiration with PO trials and mod I use of swallowing precautions.   SLP Short Term Goal 2 (Week 1): Pt will complete 100 repetitions of pharyngeal strengthening exercises with mod I each session.   SLP Short Term Goal 3 (Week 1): Pt will complete 25 repetitions of EMST with mod I use of device and a self perceived effort level of <7/10.   SLP Short Term Goal 4 (Week 1): Pt will utilize suction to correct wet vocal quality no more than 10 times in a session.    Skilled Therapeutic Interventions: Skilled treatment session focused on education regarding results of Modified Barium Swallow Study, recommendation to remain NPO, continue with pharyngeal strengthening exercises, small/limited trials of water at bedside with ST and need for long term alternate means of nutrition. All questions answered to pt satisfaction.      Function:  Eating Eating   Modified Consistency Diet: Yes             Cognition Comprehension Comprehension assist level: Follows complex conversation/direction with extra time/assistive device  Expression   Expression assist level: Expresses complex ideas: With extra time/assistive device  Social Interaction Social Interaction assist level: Interacts appropriately with others with medication or extra time (anti-anxiety, antidepressant).  Problem Solving Problem solving assist level: Solves complex problems: With extra time  Memory Memory assist level: More than reasonable amount of time    Pain    Therapy/Group: Individual Therapy  [name] 11/09/2016, 12:17 PM

## 2016-11-10 ENCOUNTER — Inpatient Hospital Stay (HOSPITAL_COMMUNITY): Payer: Medicare Other | Admitting: Occupational Therapy

## 2016-11-10 ENCOUNTER — Inpatient Hospital Stay (HOSPITAL_COMMUNITY): Payer: Medicare Other | Admitting: Physical Therapy

## 2016-11-10 ENCOUNTER — Encounter (HOSPITAL_COMMUNITY): Payer: Self-pay | Admitting: General Surgery

## 2016-11-10 ENCOUNTER — Inpatient Hospital Stay (HOSPITAL_COMMUNITY): Payer: Medicare Other

## 2016-11-10 LAB — GLUCOSE, CAPILLARY
GLUCOSE-CAPILLARY: 118 mg/dL — AB (ref 65–99)
GLUCOSE-CAPILLARY: 142 mg/dL — AB (ref 65–99)
GLUCOSE-CAPILLARY: 144 mg/dL — AB (ref 65–99)
GLUCOSE-CAPILLARY: 89 mg/dL (ref 65–99)
Glucose-Capillary: 145 mg/dL — ABNORMAL HIGH (ref 65–99)
Glucose-Capillary: 119 mg/dL — ABNORMAL HIGH (ref 65–99)
Glucose-Capillary: 119 mg/dL — ABNORMAL HIGH (ref 65–99)

## 2016-11-10 MED ORDER — JEVITY 1.5 CAL/FIBER PO LIQD
1000.0000 mL | ORAL | Status: DC
  Administered 2016-11-10 – 2016-11-16 (×7): 1000 mL
  Filled 2016-11-10 (×13): qty 1000

## 2016-11-10 NOTE — Progress Notes (Signed)
Physical Therapy Session Note  Patient Details  Name: [name] MRN: [id number] Date of Birth: 06-12-1940  Today's Date: 11/10/2016 PT Individual Time: 0800-0900 PT Individual Time Calculation (min): 60 min   Short Term Goals: Week 1:  PT Short Term Goal 1 (Week 1): STG=LTG  Skilled Therapeutic Interventions/Progress Updates: Pt presented in recliner agreeable to therapy. Pt donned shoes with set up and performed stand pivot to recliner with min guard. Pt propelled to rehab gym for endurance and pt transferred to mat stand pivot min guard. Pt participated in standing balance on Airex with mod challenges. Pt able to maintain balance with min challenges and required minA for mod challenges. Participated in obstacle course noAD including cones, and step overs. Pt with x 1 LOB around cones requiring minA from PTA for correction. Pt ambulated throughout unit >375f with RW and min guard/close S. Pt returned to room and returned to recliner with call bell within reach and needs met.    Therapy Documentation Precautions:  Precautions Precautions: Fall Restrictions Weight Bearing Restrictions: No General:   Vital Signs: Therapy Vitals Temp: 98.1 F (36.7 C) Temp Source: Oral Pulse Rate: 99 Resp: 16 BP: 133/69 Patient Position (if appropriate): Sitting Oxygen Therapy SpO2: 96 % O2 Device: Not Delivered Pain: Pain Assessment Pain Assessment: No/denies pain   See Function Navigator for Current Functional Status.   Therapy/Group: Individual Therapy  Lyndell Allaire  Tida Saner, PTA  11/10/2016, 4:12 PM

## 2016-11-10 NOTE — Progress Notes (Signed)
Occupational Therapy Session Note  Patient Details  Name: [name] MRN: [id number] Date of Birth: 11/06/1940  Today's Date: 11/10/2016 OT Individual Time: 0930-1029 OT Individual Time Calculation (min): 59 min    Short Term Goals: Week 1:  OT Short Term Goal 1 (Week 1): LTg=STG  Skilled Therapeutic Interventions/Progress Updates:    Pt seated in recliner at bedside upon OT arrival and requested to participate in ADL retraining.  Pt ambulated in room without AD and CGA to retrieve necessary items.  Pt undressed at recliner and ambulated to shower with CGA.  Pt bathed self with distant S, almost from a Mod I level.  Pt ambulated back to recliner without AD and CGA to complete dressing.  Pt dried hair and completed grooming routine from seated/standing positions at sink.  Pt participated in 2 sets of 10 mini squats and 2 sets of 5 lateral lunges without UE support and with steadying A from OT to improve BLE strength and stamina necessary for increased safety/I with IADLs and functional mobility.  Pt tolerated treatment session well and was left seated in recliner with all needs in reach upon OT departure.  Therapy Documentation Precautions:  Precautions Precautions: Fall Restrictions Weight Bearing Restrictions: No Pain: Pain Assessment Pain Assessment: No/denies pain  See Function Navigator for Current Functional Status.   Therapy/Group: Individual Therapy  Deitra Mayolexandra  Eda Magnussen 11/10/2016, 11:28 AM

## 2016-11-10 NOTE — Progress Notes (Signed)
Subjective/Complaints:  Reviewed MBS results with pt, discussed process of PEG placement  ROS: pt denies nausea, vomiting, diarrhea, cough, shortness of breath or chest pain   Objective: Vital Signs: Blood pressure (!) 141/76, pulse 94, temperature (!) 97.5 F (36.4 C), temperature source Oral, resp. rate 12, weight 58.5 kg (128 lb 15.5 oz), SpO2 98 %. Dg Swallowing Func-speech Pathology  Result Date: 11/09/2016 Objective Swallowing Evaluation: Type of Study: MBS-Modified Barium Swallow Study Patient Details Name: [name] MRN: [id number] Date of Birth: 1940/12/04 Today's Date: 11/09/2016 Time: SLP Start Time (ACUTE ONLY): 0818-SLP Stop Time (ACUTE ONLY): 0826 SLP Time Calculation (min) (ACUTE ONLY): 8 min Past Medical History: Past Medical History: Diagnosis Date . Arthritis  . Hypertension  Past Surgical History: Past Surgical History: Procedure Laterality Date . BACK SURGERY   . CHOLECYSTECTOMY  2006 HPI: Pt is a 76 y.o.femalewith PMH of HTN admitted 10/17 after syncopal episode at church. 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 She was intubated 10/17-10/18. Subjective: pt says she is having trouble swallowing, "can't get it down" Assessment / Plan / Recommendation CHL IP CLINICAL IMPRESSIONS 11/09/2016 Clinical Impression Pt continues to present with moderate to severe pharyngeal and cervical esophageal dysphagia. Of note, Cortrak in place and suspected osteophytes most prominent at C5-C6. Pt continues to decreased anterior excurison of hyoid, no epiglotic inversaion and reduce pharyngeal movement all of which continue to prevent effective airway closure. Pt with penetration before and during swallow with nectar thick liquids and thin liquids. However when prompted to cough and reswallow pt was able to clear residuals in larnygeal vestibule as well as most of phayrngeal residue - however over  time, pt with deeper penetration to level of vocal cords and inability to clear. This strategy was not effective in clearing nectar thick liquid residuals and pt required oral suctioning for removal of residue. Pt with no movement of puree bolus through pharynx which led to aspiration during and after swallow of puree. After swallow pt exhibited upward movement of bolus indicated of decreased UES opening. Recommend remain NPO except for a few ice chips or sips of water after oral care. Pt continues at very high aspiration risk of aspriation and inability to mainatin nurtitional status orally. Recommend continued alternate means of nutrition. Cotninue intensive SLP /fu for swallowing exercises and therapuetic trials.  SLP Visit Diagnosis Dysphagia, pharyngoesophageal phase (R13.14) Attention and concentration deficit following -- Frontal lobe and executive function deficit following -- Impact on safety and function Severe aspiration risk;Risk for inadequate nutrition/hydration   CHL IP TREATMENT RECOMMENDATION 11/09/2016 Treatment Recommendations Therapy as outlined in treatment plan below   Prognosis 10/30/2016 Prognosis for Safe Diet Advancement Good Barriers to Reach Goals Severity of deficits Barriers/Prognosis Comment -- CHL IP DIET RECOMMENDATION 11/09/2016 SLP Diet Recommendations NPO;Ice chips PRN after oral care;Alternative means - long-term Liquid Administration via -- Medication Administration Via alternative means Compensations -- Postural Changes --   CHL IP OTHER RECOMMENDATIONS 11/09/2016 Recommended Consults -- Oral Care Recommendations Oral care QID Other Recommendations Have oral suction available   CH[facility] IP FOLLOW UP RECOMMENDATIONS 11/03/2016 Follow up Recommendations Inpatient Rehab   CHL IP FREQUENCY AND DURATION 10/30/2016 Speech Therapy Frequency (ACUTE ONLY) min 3x week Treatment Duration 2 weeks      CHL IP ORAL PHASE 11/09/2016 Oral Phase WFL Oral - Pudding Teaspoon -- Oral - Pudding Cup --  Oral - Honey Teaspoon -- Oral - Honey  Cup -- Oral - Nectar Teaspoon -- Oral - Nectar Cup -- Oral - Nectar Straw -- Oral - Thin Teaspoon -- Oral - Thin Cup -- Oral - Thin Straw -- Oral - Puree -- Oral - Mech Soft -- Oral - Regular -- Oral - Multi-Consistency -- Oral - Pill -- Oral Phase - Comment --  CHL IP PHARYNGEAL PHASE 11/09/2016 Pharyngeal Phase Impaired Pharyngeal- Pudding Teaspoon -- Pharyngeal -- Pharyngeal- Pudding Cup -- Pharyngeal -- Pharyngeal- Honey Teaspoon -- Pharyngeal -- Pharyngeal- Honey Cup -- Pharyngeal -- Pharyngeal- Nectar Teaspoon Reduced pharyngeal peristalsis;Reduced epiglottic inversion;Reduced anterior laryngeal mobility;Reduced airway/laryngeal closure;Reduced tongue base retraction;Penetration/Aspiration during swallow;Pharyngeal residue - valleculae;Pharyngeal residue - pyriform;Pharyngeal residue - posterior pharnyx;Pharyngeal residue - cp segment;Compensatory strategies attempted (with notebox) Pharyngeal Material enters airway, remains ABOVE vocal cords and not ejected out Pharyngeal- Nectar Cup -- Pharyngeal -- Pharyngeal- Nectar Straw -- Pharyngeal -- Pharyngeal- Thin Teaspoon Reduced pharyngeal peristalsis;Reduced epiglottic inversion;Reduced anterior laryngeal mobility;Reduced airway/laryngeal closure;Reduced tongue base retraction;Penetration/Aspiration before swallow;Penetration/Aspiration during swallow;Pharyngeal residue - valleculae;Pharyngeal residue - pyriform;Pharyngeal residue - posterior pharnyx;Pharyngeal residue - cp segment Pharyngeal Material enters airway, remains ABOVE vocal cords and not ejected out Pharyngeal- Thin Cup Reduced pharyngeal peristalsis;Reduced epiglottic inversion;Reduced anterior laryngeal mobility;Reduced airway/laryngeal closure;Reduced tongue base retraction;Penetration/Aspiration before swallow;Penetration/Aspiration during swallow;Pharyngeal residue - valleculae;Pharyngeal residue - pyriform;Pharyngeal residue - posterior pharnyx;Pharyngeal  residue - cp segment Pharyngeal Material enters airway, CONTACTS cords and not ejected out;Other (Comment) Pharyngeal- Thin Straw -- Pharyngeal -- Pharyngeal- Puree Reduced pharyngeal peristalsis;Reduced anterior laryngeal mobility;Reduced epiglottic inversion;Reduced airway/laryngeal closure;Reduced tongue base retraction;Penetration/Aspiration during swallow;Penetration/Apiration after swallow;Significant aspiration (Amount);Pharyngeal residue - valleculae;Pharyngeal residue - pyriform;Pharyngeal residue - posterior pharnyx;Pharyngeal residue - cp segment Pharyngeal Material enters airway, passes BELOW cords without attempt by patient to eject out (silent aspiration);Other (Comment) Pharyngeal- Mechanical Soft -- Pharyngeal -- Pharyngeal- Regular -- Pharyngeal -- Pharyngeal- Multi-consistency -- Pharyngeal -- Pharyngeal- Pill -- Pharyngeal -- Pharyngeal Comment --  CHL IP CERVICAL ESOPHAGEAL PHASE 11/09/2016 Cervical Esophageal Phase Impaired Pudding Teaspoon -- Pudding Cup -- Honey Teaspoon -- Honey Cup -- Nectar Teaspoon Reduced cricopharyngeal relaxation Nectar Cup -- Nectar Straw -- Thin Teaspoon Reduced cricopharyngeal relaxation Thin Cup Reduced cricopharyngeal relaxation Thin Straw -- Puree Reduced cricopharyngeal relaxation Mechanical Soft -- Regular -- Multi-consistency -- Pill -- Cervical Esophageal Comment -- No flowsheet data found. Happi Overton 11/09/2016, 12:11 PM              Results for orders placed or performed during the hospital encounter of 11/03/16 (from the past 72 hour(s))  Glucose, capillary     Status: Abnormal   Collection Time: 11/07/16 12:08 PM  Result Value Ref Range   Glucose-Capillary 109 (H) 65 - 99 mg/dL  Glucose, capillary     Status: Abnormal   Collection Time: 11/07/16  4:42 PM  Result Value Ref Range   Glucose-Capillary 117 (H) 65 - 99 mg/dL  Glucose, capillary     Status: Abnormal   Collection Time: 11/07/16  7:54 PM  Result Value Ref Range   Glucose-Capillary  110 (H) 65 - 99 mg/dL   Comment 1 Notify RN   Glucose, capillary     Status: Abnormal   Collection Time: 11/07/16 11:59 PM  Result Value Ref Range   Glucose-Capillary 157 (H) 65 - 99 mg/dL   Comment 1 Notify RN   Glucose, capillary     Status: Abnormal   Collection Time: 11/08/16  3:57 AM  Result Value Ref Range   Glucose-Capillary 126 (H) 65 - 99 mg/dL   Comment 1  Notify RN   Glucose, capillary     Status: Abnormal   Collection Time: 11/08/16  8:02 AM  Result Value Ref Range   Glucose-Capillary 152 (H) 65 - 99 mg/dL  Glucose, capillary     Status: None   Collection Time: 11/08/16 12:17 PM  Result Value Ref Range   Glucose-Capillary 81 65 - 99 mg/dL  Glucose, capillary     Status: Abnormal   Collection Time: 11/08/16  4:06 PM  Result Value Ref Range   Glucose-Capillary 114 (H) 65 - 99 mg/dL  Glucose, capillary     Status: Abnormal   Collection Time: 11/08/16  8:07 PM  Result Value Ref Range   Glucose-Capillary 120 (H) 65 - 99 mg/dL   Comment 1 Notify RN   Glucose, capillary     Status: Abnormal   Collection Time: 11/09/16 12:01 AM  Result Value Ref Range   Glucose-Capillary 151 (H) 65 - 99 mg/dL   Comment 1 Notify RN   Glucose, capillary     Status: Abnormal   Collection Time: 11/09/16  4:29 AM  Result Value Ref Range   Glucose-Capillary 131 (H) 65 - 99 mg/dL   Comment 1 Notify RN   Glucose, capillary     Status: Abnormal   Collection Time: 11/09/16  7:58 AM  Result Value Ref Range   Glucose-Capillary 124 (H) 65 - 99 mg/dL  Basic metabolic panel     Status: Abnormal   Collection Time: 11/09/16 11:13 AM  Result Value Ref Range   Sodium 145 135 - 145 mmol/L   Potassium 4.2 3.5 - 5.1 mmol/L   Chloride 106 101 - 111 mmol/L   CO2 29 22 - 32 mmol/L   Glucose, Bld 121 (H) 65 - 99 mg/dL   BUN 44 (H) 6 - 20 mg/dL   Creatinine, Ser 0.80 0.44 - 1.00 mg/dL   Calcium 9.7 8.9 - 10.3 mg/dL   GFR calc non Af Amer >60 >60 mL/min   GFR calc Af Amer >60 >60 mL/min    Comment:  (NOTE) The eGFR has been calculated using the CKD EPI equation. This calculation has not been validated in all clinical situations. eGFR's persistently <60 mL/min signify possible Chronic Kidney Disease.    Anion gap 10 5 - 15  Glucose, capillary     Status: Abnormal   Collection Time: 11/09/16 11:37 AM  Result Value Ref Range   Glucose-Capillary 122 (H) 65 - 99 mg/dL  Glucose, capillary     Status: None   Collection Time: 11/09/16  3:37 PM  Result Value Ref Range   Glucose-Capillary 99 65 - 99 mg/dL  Glucose, capillary     Status: None   Collection Time: 11/09/16  8:22 PM  Result Value Ref Range   Glucose-Capillary 86 65 - 99 mg/dL  Glucose, capillary     Status: None   Collection Time: 11/10/16 12:15 AM  Result Value Ref Range   Glucose-Capillary 89 65 - 99 mg/dL  Glucose, capillary     Status: Abnormal   Collection Time: 11/10/16  4:09 AM  Result Value Ref Range   Glucose-Capillary 142 (H) 65 - 99 mg/dL     HEENT: normal Cardio:.RRR without murmur. No JVD  Resp: CTA Bilaterally without wheezes or rales. Normal effort   GI: BS positive and Nontender and nondistended Extremity:  No Edema Skin:   Intact Neuro: Alert/Oriented and Other Motor strength is 5/5 bilateral deltoid bicep tricep grip hip flexor and extensor ankle dorsiflexor, no dysmetria  on finger-nose-finger testing. No nystagmus Musc/Skel:  Other No pain with upper limb or lower limb range of motion General no acute distress   Assessment/Plan: 1. 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 Physiatrist is providing close team supervision and 24 hour management of active medical problems listed below. Physiatrist and rehab team continue to assess barriers to discharge/monitor patient progress toward functional and medical goals. FIM: Function -  Bathing Position: Shower Body parts bathed by patient: Right arm, Left arm, Chest, Abdomen, Front perineal area, Buttocks, Right upper leg, Left upper leg, Left lower leg, Right lower leg, Back Body parts bathed by helper: Back Assist Level: Supervision or verbal cues  Function- Upper Body Dressing/Undressing What is the patient wearing?: Pull over shirt/dress, Bra Bra - Perfomed by patient: Thread/unthread right bra strap, Thread/unthread left bra strap, Hook/unhook bra (pull down sports bra) Pull over shirt/dress - Perfomed by patient: Thread/unthread right sleeve, Thread/unthread left sleeve, Put head through opening, Pull shirt over trunk Assist Level: More than reasonable time Function - Lower Body Dressing/Undressing What is the patient wearing?: Underwear, Shoes, Pants Position: Other (comment) (recliner) Underwear - Performed by patient: Thread/unthread right underwear leg, Thread/unthread left underwear leg, Pull underwear up/down Pants- Performed by patient: Thread/unthread right pants leg, Thread/unthread left pants leg, Pull pants up/down, Fasten/unfasten pants Non-skid slipper socks- Performed by patient: Don/doff right sock, Don/doff left sock Shoes - Performed by patient: Don/doff right shoe, Don/doff left shoe Assist for footwear: Setup Assist for lower body dressing: Supervision or verbal cues  Function - Toileting Toileting steps completed by patient: Adjust clothing prior to toileting, Performs perineal hygiene, Adjust clothing after toileting Assist level: Touching or steadying assistance (Pt.75%)  Function - Air cabin crew transfer assistive device: Grab bar Assist level to toilet: Touching or steadying assistance (Pt > 75%) Assist level from toilet: Touching or steadying assistance (Pt > 75%)  Function - Chair/bed transfer Chair/bed transfer method: Stand pivot Chair/bed transfer assist level: Supervision or verbal cues Chair/bed transfer assistive  device: Armrests Chair/bed transfer details: Tactile cues for posture, Verbal cues for precautions/safety  Function - Locomotion: Wheelchair Will patient use wheelchair at discharge?: No Wheelchair activity did not occur: N/A Wheel 50 feet with 2 turns activity did not occur: N/A Wheel 150 feet activity did not occur: N/A Function - Locomotion: Ambulation Assistive device: No device Max distance: 25f  Assist level: Moderate assist (Pt 50 - 74%) Assist level: Touching or steadying assistance (Pt > 75%) Assist level: Moderate assist (Pt 50 - 74%) Walk 150 feet activity did not occur: Safety/medical concerns Assist level: Moderate assist (Pt 50 - 74%) Assist level: Touching or steadying assistance (Pt > 75%)  Function - Comprehension Comprehension: Auditory Comprehension assist level: Follows complex conversation/direction with extra time/assistive device  Function - Expression Expression: Verbal Expression assist level: Expresses complex ideas: With extra time/assistive device  Function - Social Interaction Social Interaction assist level: Interacts appropriately with others with medication or extra time (anti-anxiety, antidepressant).  Function - Problem Solving Problem solving assist level: Solves complex problems: With extra time  Function - Memory Memory assist level: More than reasonable amount of time Patient normally able to recall (first 3 days only): Current season, Staff names and faces, That he or she is in a hospital, Location of own room  Medical Problem List and Plan: 1.  Dizziness with gait deficits secondary to left  greater than right cerebellar and left medullary infarcts. Recommendations of 30 day event monitor as outpatient             -CIR PT, OT, SLP    2.  DVT Prophylaxis/Anticoagulation: Subcutaneous heparin.--dc, encourage ambulation, on ASA and Plavix 3. Pain Management: Tylenol as needed 4. Mood: Zoloft 50 mg daily 5. Neuropsych: This patient is  capable of making decisions on her own behalf. 6. Skin/Wound Care: Routine skin checks 7. Fluids/Electrolytes/Nutrition: tolerating TF 8. Dysphagia. Patient with tube feeds   -  Repeat MBS 10/29- no improvement  -tolerating NGT without issues at this time  -discussed with her the fact that we will discuss plan based on results of MBS tomorrow.Consult IR for PEG  9. Hypertension. Permissive hypertension. Monitor of increased mobility. Patient on Hyzaar 100-12 0.5 mg daily prior to admission. bp's controlled at present Vitals:   11/09/16 1949 11/10/16 0415  BP: 117/74 (!) 141/76  Pulse: (!) 104 94  Resp:  12  Temp:  (!) 97.5 F (36.4 C)  SpO2: 99% 98%   10. Hyperlipidemia. Lipitor 11. Hypokalemia. 3.4 on 10/24    LOS (Days) 7 A FACE TO FACE EVALUATION WAS PERFORMED  KIRSTEINS,ANDREW E 11/10/2016, 8:28 AM

## 2016-11-10 NOTE — Progress Notes (Signed)
Occupational Therapy Session Note  Patient Details  Name: [name] MRN: [id number] Date of Birth: 25-Feb-1940  Today's Date: 11/10/2016 OT Individual Time: 0026-2854 OT Individual Time Calculation (min): 43 min    Short Term Goals: Week 1:  OT Short Term Goal 1 (Week 1): LTg=STG  Skilled Therapeutic Interventions/Progress Updates:    Pt seated in w/c upon OT arrival and was agreeable to participating in OT session with focus on dynamic standing balance and I with functional mobility.  Pt ambulated to/from restroom without AD and close SBA.  Pt performed toileting tasks with distant S.  Pt ambulated in hallway with RW for safety.  Education provided on home safety/modifications for energy conservation and falls prevention in kitchen environment.  Pt completed step-over tub transfer with CGA and no LOB, stating she felt close to baseline with her tub transfer.  Pt participated in dynamic standing activity on foam, weightshifting and reaching for puzzle pieces at a variety of angles and distances from body.  Occasional Min A required to correct LOB and assist in steadying.  Pt then participated in [facility] activity with the [place], demonstrating difficulty with in-hand manipulation skills and requiring increased time to complete task.  Pt assisted back to room and was left seated in w/c with all needs met and in reach.  Therapy Documentation Precautions:  Precautions Precautions: Fall Restrictions Weight Bearing Restrictions: No Pain: Pain Assessment Pain Assessment: No/denies pain  See Function Navigator for Current Functional Status.   Therapy/Group: Individual Therapy  Marcella Dubs 11/10/2016, 2:50 PM

## 2016-11-10 NOTE — Consult Note (Signed)
Chief Complaint: dysphagia s/p CVA  Referring Physician:Dr. Alysia Penna  Supervising Physician: Daryll Brod  Patient Status: [facility] - In-pt  HPI: [name] is a 76 y.o. female with a h/o HTN who was admitted 2 weeks ago secondary to a CVA.  She was admitted and started on ASA and Plavix.  She is currently in rehab.  She has a PANDA tube present secondary to dysphagia.  She had a swallow evaluation yesterday which she did not pass.  A request has been made for a g-tube to be placed given her inability to take orals.  Past Medical History:  Past Medical History:  Diagnosis Date  . Arthritis   . Hypertension     Past Surgical History:  Past Surgical History:  Procedure Laterality Date  . BACK SURGERY    . CHOLECYSTECTOMY  2006    Family History: History reviewed. No pertinent family history.  Social History:  reports that she has never smoked. She has never used smokeless tobacco. She reports that she does not drink alcohol or use drugs.  Allergies:  Allergies  Allergen Reactions  . Lisinopril Other (See Comments)    Dizziness and felt odd  . Lisinopril     Dizziness per merged chart     Medications: Medications reviewed in epic  Please HPI for pertinent positives, otherwise complete 10 system ROS negative, except some dizziness with ambulating.  Mallampati Score: MD Evaluation Airway: WNL Heart: WNL Abdomen: WNL Chest/ Lungs: WNL ASA  Classification: 3 Mallampati/Airway Score: Two  Physical Exam: BP 133/69 (BP Location: Left Arm)   Pulse 99   Temp 98.1 F (36.7 C) (Oral)   Resp 16   Wt 128 lb 15.5 oz (58.5 kg)   SpO2 96%   BMI 25.19 kg/m  Body mass index is 25.19 kg/m. General: pleasant, WD, WN white female who is sitting in a chair in NAD HEENT: head is normocephalic, atraumatic.  Sclera are noninjected.  PERRL.  Ears and nose without any masses or lesions.  PANDA tube in place.  Mouth is pink and moist Heart: regular, rate, and rhythm.   Normal s1,s2. No obvious murmurs, gallops, or rubs noted.  Palpable radial pulses bilaterally Lungs: CTAB, no wheezes, rhonchi, or rales noted.  Respiratory effort nonlabored Abd: soft, NT, ND, +BS, no masses, hernias, or organomegaly Psych: A&Ox3 with an appropriate affect.   Labs: Results for orders placed or performed during the hospital encounter of 11/03/16 (from the past 48 hour(s))  Glucose, capillary     Status: Abnormal   Collection Time: 11/08/16  4:06 PM  Result Value Ref Range   Glucose-Capillary 114 (H) 65 - 99 mg/dL  Glucose, capillary     Status: Abnormal   Collection Time: 11/08/16  8:07 PM  Result Value Ref Range   Glucose-Capillary 120 (H) 65 - 99 mg/dL   Comment 1 Notify RN   Glucose, capillary     Status: Abnormal   Collection Time: 11/09/16 12:01 AM  Result Value Ref Range   Glucose-Capillary 151 (H) 65 - 99 mg/dL   Comment 1 Notify RN   Glucose, capillary     Status: Abnormal   Collection Time: 11/09/16  4:29 AM  Result Value Ref Range   Glucose-Capillary 131 (H) 65 - 99 mg/dL   Comment 1 Notify RN   Glucose, capillary     Status: Abnormal   Collection Time: 11/09/16  7:58 AM  Result Value Ref Range   Glucose-Capillary 124 (H) 65 -  99 mg/dL  Basic metabolic panel     Status: Abnormal   Collection Time: 11/09/16 11:13 AM  Result Value Ref Range   Sodium 145 135 - 145 mmol/L   Potassium 4.2 3.5 - 5.1 mmol/L   Chloride 106 101 - 111 mmol/L   CO2 29 22 - 32 mmol/L   Glucose, Bld 121 (H) 65 - 99 mg/dL   BUN 44 (H) 6 - 20 mg/dL   Creatinine, Ser 0.80 0.44 - 1.00 mg/dL   Calcium 9.7 8.9 - 10.3 mg/dL   GFR calc non Af Amer >60 >60 mL/min   GFR calc Af Amer >60 >60 mL/min    Comment: (NOTE) The eGFR has been calculated using the CKD EPI equation. This calculation has not been validated in all clinical situations. eGFR's persistently <60 mL/min signify possible Chronic Kidney Disease.    Anion gap 10 5 - 15  Glucose, capillary     Status: Abnormal    Collection Time: 11/09/16 11:37 AM  Result Value Ref Range   Glucose-Capillary 122 (H) 65 - 99 mg/dL  Glucose, capillary     Status: None   Collection Time: 11/09/16  3:37 PM  Result Value Ref Range   Glucose-Capillary 99 65 - 99 mg/dL  Glucose, capillary     Status: None   Collection Time: 11/09/16  8:22 PM  Result Value Ref Range   Glucose-Capillary 86 65 - 99 mg/dL  Glucose, capillary     Status: None   Collection Time: 11/10/16 12:15 AM  Result Value Ref Range   Glucose-Capillary 89 65 - 99 mg/dL  Glucose, capillary     Status: Abnormal   Collection Time: 11/10/16  4:09 AM  Result Value Ref Range   Glucose-Capillary 142 (H) 65 - 99 mg/dL  Glucose, capillary     Status: Abnormal   Collection Time: 11/10/16  9:03 AM  Result Value Ref Range   Glucose-Capillary 118 (H) 65 - 99 mg/dL  Glucose, capillary     Status: Abnormal   Collection Time: 11/10/16 11:55 AM  Result Value Ref Range   Glucose-Capillary 145 (H) 65 - 99 mg/dL    Imaging: Ct Abdomen Wo Contrast  Result Date: 11/10/2016 CLINICAL DATA:  Dysphagia, assess for fluoroscopic gastrostomy EXAM: CT ABDOMEN WITHOUT CONTRAST TECHNIQUE: Multidetector CT imaging of the abdomen was performed following the standard protocol without IV contrast. COMPARISON:  11/20/2004 FINDINGS: Lower chest: Minor bibasilar atelectasis. Normal heart size. No pericardial or pleural effusion. Hepatobiliary: Remote cholecystectomy. No definite focal hepatic abnormality within the limits of noncontrast imaging. Diffuse biliary dilatation noted, common bile duct measures up to 17 mm. Suspect postcholecystectomy related. Pancreas: Unremarkable. No pancreatic ductal dilatation or surrounding inflammatory changes. Spleen: Normal in size without focal abnormality. Adrenals/Urinary Tract: Stable small 8 mm left adrenal nodule, suspect adenoma. The right adrenal gland appears normal. No definite renal obstruction or hydronephrosis. Collecting systems are  prominent bilaterally. No hydroureter. Small exophytic right renal cyst suspected measuring 10 mm, image 31. Stomach/Bowel: Feeding tube tip is in the mid duodenum. Duodenum diverticulum noted. Negative for bowel obstruction, significant dilatation, ileus, or free air. Colonic diverticulosis noted. Stomach is decompressed. No hiatal hernia. Stomach is within the anterior abdomen inferior to the xiphoid and left hepatic lobe. Stomach is also superior to the colon. Stomach anatomy is amenable to percutaneous fluoroscopic gastrostomy technique. No fluid collection or abscess. Vascular/Lymphatic: Abdominal atherosclerosis evident. Negative for aneurysm. No significant adenopathy within the limits of noncontrast imaging. Other: No ascites or abdominal wall  hernias. Musculoskeletal: Extensive artifact from previous posterior spinal fusion spanning several levels. No definite acute osseous finding. IMPRESSION: Stomach anatomy is amenable to percutaneous fluoroscopic gastrostomy technique. No other acute intra-abdominal finding by noncontrast CT. Abdominal atherosclerosis without aneurysm Colonic diverticulosis Remote cholecystectomy. Diffuse biliary dilatation, suspect postcholecystectomy related. Electronically Signed   By: Jerilynn Mages.  Shick M.D.   On: 11/10/2016 14:25   Dg Swallowing Func-speech Pathology  Result Date: 11/09/2016 Objective Swallowing Evaluation: Type of Study: MBS-Modified Barium Swallow Study Patient Details Name: NETTIE WYFFELS MRN: [id number] Date of Birth: 01-Sep-1940 Today's Date: 11/09/2016 Time: SLP Start Time (ACUTE ONLY): 0818-SLP Stop Time (ACUTE ONLY): 0826 SLP Time Calculation (min) (ACUTE ONLY): 8 min Past Medical History: Past Medical History: Diagnosis Date . Arthritis  . Hypertension  Past Surgical History: Past Surgical History: Procedure Laterality Date . BACK SURGERY   . CHOLECYSTECTOMY  2006 HPI: Pt is a 76 y.o.femalewith PMH of HTN admitted 10/17 after syncopal episode at church. MRI  showed several small areas of acute/early subacute infarction involving the left medulla and scattered throughout the L > R cerebellum. She was intubated 10/17-10/18. Subjective: pt says she is having trouble swallowing, "can't get it down" Assessment / Plan / Recommendation CHL IP CLINICAL IMPRESSIONS 11/09/2016 Clinical Impression Pt continues to present with moderate to severe pharyngeal and cervical esophageal dysphagia. Of note, Cortrak in place and suspected osteophytes most prominent at C5-C6. Pt continues to decreased anterior excurison of hyoid, no epiglotic inversaion and reduce pharyngeal movement all of which continue to prevent effective airway closure. Pt with penetration before and during swallow with nectar thick liquids and thin liquids. However when prompted to cough and reswallow pt was able to clear residuals in larnygeal vestibule as well as most of phayrngeal residue - however over time, pt with deeper penetration to level of vocal cords and inability to clear. This strategy was not effective in clearing nectar thick liquid residuals and pt required oral suctioning for removal of residue. Pt with no movement of puree bolus through pharynx which led to aspiration during and after swallow of puree. After swallow pt exhibited upward movement of bolus indicated of decreased UES opening. Recommend remain NPO except for a few ice chips or sips of water after oral care. Pt continues at very high aspiration risk of aspriation and inability to mainatin nurtitional status orally. Recommend continued alternate means of nutrition. Cotninue intensive SLP /fu for swallowing exercises and therapuetic trials.  SLP Visit Diagnosis Dysphagia, pharyngoesophageal phase (R13.14) Attention and concentration deficit following -- Frontal lobe and executive function deficit following -- Impact on safety and function Severe aspiration risk;Risk for inadequate nutrition/hydration   CHL IP TREATMENT RECOMMENDATION  11/09/2016 Treatment Recommendations Therapy as outlined in treatment plan below   Prognosis 10/30/2016 Prognosis for Safe Diet Advancement Good Barriers to Reach Goals Severity of deficits Barriers/Prognosis Comment -- CHL IP DIET RECOMMENDATION 11/09/2016 SLP Diet Recommendations NPO;Ice chips PRN after oral care;Alternative means - long-term Liquid Administration via -- Medication Administration Via alternative means Compensations -- Postural Changes --   CHL IP OTHER RECOMMENDATIONS 11/09/2016 Recommended Consults -- Oral Care Recommendations Oral care QID Other Recommendations Have oral suction available   CHL IP FOLLOW UP RECOMMENDATIONS 11/03/2016 Follow up Recommendations Inpatient Rehab   CHL IP FREQUENCY AND DURATION 10/30/2016 Speech Therapy Frequency (ACUTE ONLY) min 3x week Treatment Duration 2 weeks      CHL IP ORAL PHASE 11/09/2016 Oral Phase WFL Oral - Pudding Teaspoon -- Oral - Pudding Cup -- Oral -  Honey Teaspoon -- Oral - Honey Cup -- Oral - Nectar Teaspoon -- Oral - Nectar Cup -- Oral - Nectar Straw -- Oral - Thin Teaspoon -- Oral - Thin Cup -- Oral - Thin Straw -- Oral - Puree -- Oral - Mech Soft -- Oral - Regular -- Oral - Multi-Consistency -- Oral - Pill -- Oral Phase - Comment --  CHL IP PHARYNGEAL PHASE 11/09/2016 Pharyngeal Phase Impaired Pharyngeal- Pudding Teaspoon -- Pharyngeal -- Pharyngeal- Pudding Cup -- Pharyngeal -- Pharyngeal- Honey Teaspoon -- Pharyngeal -- Pharyngeal- Honey Cup -- Pharyngeal -- Pharyngeal- Nectar Teaspoon Reduced pharyngeal peristalsis;Reduced epiglottic inversion;Reduced anterior laryngeal mobility;Reduced airway/laryngeal closure;Reduced tongue base retraction;Penetration/Aspiration during swallow;Pharyngeal residue - valleculae;Pharyngeal residue - pyriform;Pharyngeal residue - posterior pharnyx;Pharyngeal residue - cp segment;Compensatory strategies attempted (with notebox) Pharyngeal Material enters airway, remains ABOVE vocal cords and not ejected out  Pharyngeal- Nectar Cup -- Pharyngeal -- Pharyngeal- Nectar Straw -- Pharyngeal -- Pharyngeal- Thin Teaspoon Reduced pharyngeal peristalsis;Reduced epiglottic inversion;Reduced anterior laryngeal mobility;Reduced airway/laryngeal closure;Reduced tongue base retraction;Penetration/Aspiration before swallow;Penetration/Aspiration during swallow;Pharyngeal residue - valleculae;Pharyngeal residue - pyriform;Pharyngeal residue - posterior pharnyx;Pharyngeal residue - cp segment Pharyngeal Material enters airway, remains ABOVE vocal cords and not ejected out Pharyngeal- Thin Cup Reduced pharyngeal peristalsis;Reduced epiglottic inversion;Reduced anterior laryngeal mobility;Reduced airway/laryngeal closure;Reduced tongue base retraction;Penetration/Aspiration before swallow;Penetration/Aspiration during swallow;Pharyngeal residue - valleculae;Pharyngeal residue - pyriform;Pharyngeal residue - posterior pharnyx;Pharyngeal residue - cp segment Pharyngeal Material enters airway, CONTACTS cords and not ejected out;Other (Comment) Pharyngeal- Thin Straw -- Pharyngeal -- Pharyngeal- Puree Reduced pharyngeal peristalsis;Reduced anterior laryngeal mobility;Reduced epiglottic inversion;Reduced airway/laryngeal closure;Reduced tongue base retraction;Penetration/Aspiration during swallow;Penetration/Apiration after swallow;Significant aspiration (Amount);Pharyngeal residue - valleculae;Pharyngeal residue - pyriform;Pharyngeal residue - posterior pharnyx;Pharyngeal residue - cp segment Pharyngeal Material enters airway, passes BELOW cords without attempt by patient to eject out (silent aspiration);Other (Comment) Pharyngeal- Mechanical Soft -- Pharyngeal -- Pharyngeal- Regular -- Pharyngeal -- Pharyngeal- Multi-consistency -- Pharyngeal -- Pharyngeal- Pill -- Pharyngeal -- Pharyngeal Comment --  CHL IP CERVICAL ESOPHAGEAL PHASE 11/09/2016 Cervical Esophageal Phase Impaired Pudding Teaspoon -- Pudding Cup -- Honey Teaspoon -- Honey Cup  -- Nectar Teaspoon Reduced cricopharyngeal relaxation Nectar Cup -- Nectar Straw -- Thin Teaspoon Reduced cricopharyngeal relaxation Thin Cup Reduced cricopharyngeal relaxation Thin Straw -- Puree Reduced cricopharyngeal relaxation Mechanical Soft -- Regular -- Multi-consistency -- Pill -- Cervical Esophageal Comment -- No flowsheet data found. Happi Overton 11/09/2016, 12:11 PM               Assessment/Plan 1. 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  Our preference is for the plavix to be held 5 days prior to her procedure.  However, given her recent CVA, it is unclear if her plavix can be held or not.  We would recommend contacting neurology to see whether he plavix can or can not be held.  If it can not be held then she would be at increased risk of a bleeding complication.  If it can be held then we would wait 5 days for the hold and then plan for placement.  This has all been discussed with the patient who is aware and agreeable.  Risks and benefits discussed with the patient including, but not limited to the need for a barium enema during the procedure, bleeding, infection, peritonitis, or damage to adjacent structures. All of the patient's questions were answered, patient is agreeable to proceed. Consent signed and in chart.  Thank you for this interesting consult.  I greatly enjoyed meeting [name]  Salim and look forward to participating in their care.  A copy of this report was sent to the requesting provider on this date.  Electronically Signed: Henreitta Cea 11/10/2016, 3:29 PM   I spent a total of 40 Minutes  in face to face in clinical consultation, greater than 50% of which was counseling/coordinating care for dysphagia

## 2016-11-10 NOTE — Progress Notes (Signed)
Speech Language Pathology Daily Session Note  Patient Details  Name: [name] MRN: [id number] Date of Birth: 07/18/1940  Today's Date: 11/10/2016 SLP Individual Time: 1500-1540 SLP Individual Time Calculation (min): 40 min  Short Term Goals: Week 1: SLP Short Term Goal 1 (Week 1): Pt will demonstrate readiness for repeat MBS as evidenced by minimal overt s/s of aspiration with PO trials and mod I use of swallowing precautions.   SLP Short Term Goal 2 (Week 1): Pt will complete 100 repetitions of pharyngeal strengthening exercises with mod I each session.   SLP Short Term Goal 3 (Week 1): Pt will complete 25 repetitions of EMST with mod I use of device and a self perceived effort level of <7/10.   SLP Short Term Goal 4 (Week 1): Pt will utilize suction to correct wet vocal quality no more than 10 times in a session.    Skilled Therapeutic Interventions:Skilled ST services focused on swallow skills. SLP facilitated oral care via suction toothbrush providing supervision question cues. SLp facilitated trials of thin liquid and ice chips via TSP, over 25 trials pt demonstrated 5 immediate coughs suggesting aspiration, pt demonstrated ability to follow swallow precautions at Mod I level. Pt demonstrated 30x resistant chin tucks and x15 hard swallow pharyngeal exercises. SLP replaced suction device and tubing. Pt was left with call bell within reach. Continue ST services.     Function:  Eating Eating   Modified Consistency Diet: Yes Eating Assist Level: Supervision or verbal cues (ice chip trials)           Cognition Comprehension Comprehension assist level: Follows complex conversation/direction with extra time/assistive device  Expression   Expression assist level: Expresses complex ideas: With extra time/assistive device  Social Interaction Social Interaction assist level: Interacts appropriately with others with medication or extra time (anti-anxiety, antidepressant).  Problem  Solving Problem solving assist level: Solves complex problems: With extra time  Memory Memory assist level: More than reasonable amount of time    Pain Pain Assessment Pain Assessment: No/denies pain  Therapy/Group: Individual Therapy  Yatzari Jonsson  [facility] HospitalCRATCH 11/10/2016, 3:44 PM

## 2016-11-10 NOTE — Progress Notes (Signed)
Nutrition Follow-up  DOCUMENTATION CODES:   Not applicable  INTERVENTION:  Increase Jevity 1.5 formula to new goal rate of 60 ml/hr x 20 hours (may hold TF for up to 4 hours for therapy) to provide 1800 kcal (100% of needs), 77 grams of protein, and 912 ml of H2O.   Continue free water flushes of 200 ml q 8 hours. Total free water: 1512 ml/day.  NUTRITION DIAGNOSIS:   Inadequate oral intake related to inability to eat as evidenced by NPO status; ongoing  GOAL:   Patient will meet greater than or equal to 90% of their needs; met  MONITOR:   TF tolerance, Weight trends, Labs, I & O's, Skin  REASON FOR ASSESSMENT:   Consult    ASSESSMENT:   76 y.o. right handed female with history of hypertension. Presented 10/28/2016 after syncopal episode. 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 Per report, several small areas of acute early subacute infarction involving left medulla and scattered throughout the left greater than right mid to inferior cerebellar hemispheres as well as vermis. NPO and currently with a nasogastric tube for nutritional support and follow-up speech therapy for dysphagia.  Pt continues at very high aspiration risk, unable to pass MBS swallow evaluation. Plans for G-tube placement. Per IR note, will need plavix to be help for 5 days prior to procedure.   Pt has been tolerating her tube feeds via Cortrak NGT. Noted pt with a weight loss since rehab admission. RD to modify tube feeds to better met nutrition needs and to prevent further weight loss. RD to continue to monitor. Labs and medications reviewed.   Diet Order:  Diet NPO time specified Except for: Ice Chips  EDUCATION NEEDS:   No education needs identified at this time  Skin:  Skin Assessment: Reviewed RN Assessment  Last BM:  10/29  Height:   Ht  Readings from Last 1 Encounters:  11/01/16 5' (1.524 m)    Weight:   Wt Readings from Last 1 Encounters:  11/10/16 128 lb 15.5 oz (58.5 kg)    Ideal Body Weight:  45.45 kg  BMI:  Body mass index is 25.19 kg/m.  Estimated Nutritional Needs:   Kcal:  1600-1800  Protein:  65-80 grams  Fluid:  1.6 - 1.8 L/day    Corrin Parker, MS, RD, LDN Pager # [phone] After hours/ weekend pager # [phone]

## 2016-11-11 ENCOUNTER — Inpatient Hospital Stay (HOSPITAL_COMMUNITY): Payer: Medicare Other | Admitting: Occupational Therapy

## 2016-11-11 ENCOUNTER — Inpatient Hospital Stay (HOSPITAL_COMMUNITY): Payer: Medicare Other | Admitting: Speech Pathology

## 2016-11-11 ENCOUNTER — Inpatient Hospital Stay (HOSPITAL_COMMUNITY): Payer: Medicare Other | Admitting: Physical Therapy

## 2016-11-11 LAB — GLUCOSE, CAPILLARY
GLUCOSE-CAPILLARY: 144 mg/dL — AB (ref 65–99)
GLUCOSE-CAPILLARY: 135 mg/dL — AB (ref 65–99)
Glucose-Capillary: 110 mg/dL — ABNORMAL HIGH (ref 65–99)
Glucose-Capillary: 112 mg/dL — ABNORMAL HIGH (ref 65–99)
Glucose-Capillary: 136 mg/dL — ABNORMAL HIGH (ref 65–99)
Glucose-Capillary: 140 mg/dL — ABNORMAL HIGH (ref 65–99)

## 2016-11-11 MED ORDER — CEFAZOLIN SODIUM-DEXTROSE 2-4 GM/100ML-% IV SOLN
2.0000 g | INTRAVENOUS | Status: AC
  Filled 2016-11-11: qty 100

## 2016-11-11 NOTE — Progress Notes (Signed)
Speech Language Pathology Weekly Progress and Session Note  Patient Details  Name: [name] MRN: [id number] Date of Birth: [birth year]  Beginning of progress report period: November 04, 2016 End of progress report period: November 11, 2016  Today's Date: 11/11/2016 SLP Individual Time: 1000-1030 SLP Individual Time Calculation (min): 30 min  Short Term Goals: Week 1: SLP Short Term Goal 1 (Week 1): Pt will demonstrate readiness for repeat MBS as evidenced by minimal overt s/s of aspiration with PO trials and mod I use of swallowing precautions.   SLP Short Term Goal 1 - Progress (Week 1): Not met SLP Short Term Goal 2 (Week 1): Pt will complete 100 repetitions of pharyngeal strengthening exercises with mod I each session.   SLP Short Term Goal 2 - Progress (Week 1): Not met SLP Short Term Goal 3 (Week 1): Pt will complete 25 repetitions of EMST with mod I use of device and a self perceived effort level of <7/10.   SLP Short Term Goal 3 - Progress (Week 1): Not met SLP Short Term Goal 4 (Week 1): Pt will utilize suction to correct wet vocal quality no more than 10 times in a session.   SLP Short Term Goal 4 - Progress (Week 1): Not met    New Short Term Goals: Week 2: SLP Short Term Goal 1 (Week 2): Pt will demonstrate readiness for repeat MBS as evidenced by minimal overt s/s of aspiration with PO trials and mod I use of swallowing precautions.   SLP Short Term Goal 2 (Week 2): Pt will complete 100 repetitions of pharyngeal strengthening exercises with mod I each session.   SLP Short Term Goal 3 (Week 2): Pt will complete 25 repetitions of EMST with mod I use of device and a self perceived effort level of <7/10.    Weekly Progress Updates: Despite pt's significant efforts with pharyngeal exercises, her most recent Modified Barium Swallow Study continue to demonstrate significant aspiration and in ability to return to PO intake. Pt understands need for longer term nutritional options and  has opted for PEG placement. Will continue to assess for s/s at bedside and to target pharyngeal strengthening exercises. Skilled ST is required to provide instrumental assessment of swallow function and pharyngeal strengthening.      Intensity: Minumum of 1-2 x/day, 30 to 90 minutes Frequency: 3 to 5 out of 7 days Duration/Length of Stay: 11/8 Treatment/Interventions: English as a second language teacher;Dysphagia/aspiration precaution training;Internal/external aids;Patient/family education   Daily Session  Skilled Therapeutic Interventions: Skilled treatment session focused on pharyngeal strengthening exercises. Pt able to complete CTAR but not with use of swallow with resistance. Pt's voice sounds clearer and she didn't require suctioning during 30 minute session. Pt for pt to have PEG placement on 11/5. If pt demonstrates less overt s/s of aspiration at bedside, this delay might give time for further instrumental swallow assessment. Pt left upright in chair with all needs within reach. Continue per current plan of care.    Function:     Cognition Comprehension Comprehension assist level: Follows complex conversation/direction with extra time/assistive device  Expression   Expression assist level: Expresses complex ideas: With extra time/assistive device  Social Interaction Social Interaction assist level: Interacts appropriately with others with medication or extra time (anti-anxiety, antidepressant).  Problem Solving Problem solving assist level: Solves complex problems: With extra time  Memory Memory assist level: More than reasonable amount of time   General    Pain    Therapy/Group: Individual Therapy  Michial Disney 11/11/2016,  2:11 PM

## 2016-11-11 NOTE — Progress Notes (Signed)
Subjective/Complaints:  Per Neuro rec to remain on at least ASA for PEG,  Risk /benmefit of placing PEG on Plavix vs stroke risk off plavix  ROS: pt denies nausea, vomiting, diarrhea, cough, shortness of breath or chest pain   Objective: Vital Signs: Blood pressure 118/60, pulse 86, temperature 98.5 F (36.9 C), temperature source Oral, resp. rate 16, weight 58.4 kg (128 lb 12 oz), SpO2 98 %. Ct Abdomen Wo Contrast  Result Date: 11/10/2016 CLINICAL DATA:  Dysphagia, assess for fluoroscopic gastrostomy EXAM: CT ABDOMEN WITHOUT CONTRAST TECHNIQUE: Multidetector CT imaging of the abdomen was performed following the standard protocol without IV contrast. COMPARISON:  11/20/2004 FINDINGS: Lower chest: Minor bibasilar atelectasis. Normal heart size. No pericardial or pleural effusion. Hepatobiliary: Remote cholecystectomy. No definite focal hepatic abnormality within the limits of noncontrast imaging. Diffuse biliary dilatation noted, common bile duct measures up to 17 mm. Suspect postcholecystectomy related. Pancreas: Unremarkable. No pancreatic ductal dilatation or surrounding inflammatory changes. Spleen: Normal in size without focal abnormality. Adrenals/Urinary Tract: Stable small 8 mm left adrenal nodule, suspect adenoma. The right adrenal gland appears normal. No definite renal obstruction or hydronephrosis. Collecting systems are prominent bilaterally. No hydroureter. Small exophytic right renal cyst suspected measuring 10 mm, image 31. Stomach/Bowel: Feeding tube tip is in the mid duodenum. Duodenum diverticulum noted. Negative for bowel obstruction, significant dilatation, ileus, or free air. Colonic diverticulosis noted. Stomach is decompressed. No hiatal hernia. Stomach is within the anterior abdomen inferior to the xiphoid and left hepatic lobe. Stomach is also superior to the colon. Stomach anatomy is amenable to percutaneous fluoroscopic gastrostomy technique. No fluid collection or abscess.  Vascular/Lymphatic: Abdominal atherosclerosis evident. Negative for aneurysm. No significant adenopathy within the limits of noncontrast imaging. Other: No ascites or abdominal wall hernias. Musculoskeletal: Extensive artifact from previous posterior spinal fusion spanning several levels. No definite acute osseous finding. IMPRESSION: Stomach anatomy is amenable to percutaneous fluoroscopic gastrostomy technique. No other acute intra-abdominal finding by noncontrast CT. Abdominal atherosclerosis without aneurysm Colonic diverticulosis Remote cholecystectomy. Diffuse biliary dilatation, suspect postcholecystectomy related. Electronically Signed   By: Jerilynn Mages.  Shick M.D.   On: 11/10/2016 14:25   Dg Swallowing Func-speech Pathology  Result Date: 11/09/2016 Objective Swallowing Evaluation: Type of Study: MBS-Modified Barium Swallow Study Patient Details Name: ANTINETTE KEOUGH MRN: [id number] Date of Birth: 03/07/1940 Today's Date: 11/09/2016 Time: SLP Start Time (ACUTE ONLY): 0818-SLP Stop Time (ACUTE ONLY): 0826 SLP Time Calculation (min) (ACUTE ONLY): 8 min Past Medical History: Past Medical History: Diagnosis Date . Arthritis  . Hypertension  Past Surgical History: Past Surgical History: Procedure Laterality Date . BACK SURGERY   . CHOLECYSTECTOMY  2006 HPI: Pt is a 76 y.o.femalewith PMH of HTN admitted 10/17 after syncopal episode at church. 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 She was intubated 10/17-10/18. Subjective: pt says she is having trouble swallowing, "can't get it down" Assessment / Plan / Recommendation CHL IP CLINICAL IMPRESSIONS 11/09/2016 Clinical Impression Pt continues to present with moderate to severe pharyngeal and cervical esophageal dysphagia. Of note, Cortrak in place and suspected osteophytes most prominent at C5-C6. Pt continues to decreased anterior excurison of hyoid, no epiglotic inversaion  and reduce pharyngeal movement all of which continue to prevent effective airway closure. Pt with penetration before and during swallow with nectar thick liquids and thin liquids. However when prompted to cough and reswallow pt was able to clear residuals in larnygeal vestibule as well as most  of phayrngeal residue - however over time, pt with deeper penetration to level of vocal cords and inability to clear. This strategy was not effective in clearing nectar thick liquid residuals and pt required oral suctioning for removal of residue. Pt with no movement of puree bolus through pharynx which led to aspiration during and after swallow of puree. After swallow pt exhibited upward movement of bolus indicated of decreased UES opening. Recommend remain NPO except for a few ice chips or sips of water after oral care. Pt continues at very high aspiration risk of aspriation and inability to mainatin nurtitional status orally. Recommend continued alternate means of nutrition. Cotninue intensive SLP /fu for swallowing exercises and therapuetic trials.  SLP Visit Diagnosis Dysphagia, pharyngoesophageal phase (R13.14) Attention and concentration deficit following -- Frontal lobe and executive function deficit following -- Impact on safety and function Severe aspiration risk;Risk for inadequate nutrition/hydration   CHL IP TREATMENT RECOMMENDATION 11/09/2016 Treatment Recommendations Therapy as outlined in treatment plan below   Prognosis 10/30/2016 Prognosis for Safe Diet Advancement Good Barriers to Reach Goals Severity of deficits Barriers/Prognosis Comment -- CHL IP DIET RECOMMENDATION 11/09/2016 SLP Diet Recommendations NPO;Ice chips PRN after oral care;Alternative means - long-term Liquid Administration via -- Medication Administration Via alternative means Compensations -- Postural Changes --   CHL IP OTHER RECOMMENDATIONS 11/09/2016 Recommended Consults -- Oral Care Recommendations Oral care QID Other Recommendations  Have oral suction available   CHL IP FOLLOW UP RECOMMENDATIONS 11/03/2016 Follow up Recommendations Inpatient Rehab   CHL IP FREQUENCY AND DURATION 10/30/2016 Speech Therapy Frequency (ACUTE ONLY) min 3x week Treatment Duration 2 weeks      CHL IP ORAL PHASE 11/09/2016 Oral Phase WFL Oral - Pudding Teaspoon -- Oral - Pudding Cup -- Oral - Honey Teaspoon -- Oral - Honey Cup -- Oral - Nectar Teaspoon -- Oral - Nectar Cup -- Oral - Nectar Straw -- Oral - Thin Teaspoon -- Oral - Thin Cup -- Oral - Thin Straw -- Oral - Puree -- Oral - Mech Soft -- Oral - Regular -- Oral - Multi-Consistency -- Oral - Pill -- Oral Phase - Comment --  CHL IP PHARYNGEAL PHASE 11/09/2016 Pharyngeal Phase Impaired Pharyngeal- Pudding Teaspoon -- Pharyngeal -- Pharyngeal- Pudding Cup -- Pharyngeal -- Pharyngeal- Honey Teaspoon -- Pharyngeal -- Pharyngeal- Honey Cup -- Pharyngeal -- Pharyngeal- Nectar Teaspoon Reduced pharyngeal peristalsis;Reduced epiglottic inversion;Reduced anterior laryngeal mobility;Reduced airway/laryngeal closure;Reduced tongue base retraction;Penetration/Aspiration during swallow;Pharyngeal residue - valleculae;Pharyngeal residue - pyriform;Pharyngeal residue - posterior pharnyx;Pharyngeal residue - cp segment;Compensatory strategies attempted (with notebox) Pharyngeal Material enters airway, remains ABOVE vocal cords and not ejected out Pharyngeal- Nectar Cup -- Pharyngeal -- Pharyngeal- Nectar Straw -- Pharyngeal -- Pharyngeal- Thin Teaspoon Reduced pharyngeal peristalsis;Reduced epiglottic inversion;Reduced anterior laryngeal mobility;Reduced airway/laryngeal closure;Reduced tongue base retraction;Penetration/Aspiration before swallow;Penetration/Aspiration during swallow;Pharyngeal residue - valleculae;Pharyngeal residue - pyriform;Pharyngeal residue - posterior pharnyx;Pharyngeal residue - cp segment Pharyngeal Material enters airway, remains ABOVE vocal cords and not ejected out Pharyngeal- Thin Cup Reduced  pharyngeal peristalsis;Reduced epiglottic inversion;Reduced anterior laryngeal mobility;Reduced airway/laryngeal closure;Reduced tongue base retraction;Penetration/Aspiration before swallow;Penetration/Aspiration during swallow;Pharyngeal residue - valleculae;Pharyngeal residue - pyriform;Pharyngeal residue - posterior pharnyx;Pharyngeal residue - cp segment Pharyngeal Material enters airway, CONTACTS cords and not ejected out;Other (Comment) Pharyngeal- Thin Straw -- Pharyngeal -- Pharyngeal- Puree Reduced pharyngeal peristalsis;Reduced anterior laryngeal mobility;Reduced epiglottic inversion;Reduced airway/laryngeal closure;Reduced tongue base retraction;Penetration/Aspiration during swallow;Penetration/Apiration after swallow;Significant aspiration (Amount);Pharyngeal residue - valleculae;Pharyngeal residue - pyriform;Pharyngeal residue - posterior pharnyx;Pharyngeal residue - cp segment Pharyngeal Material enters airway, passes BELOW cords without attempt  by patient to eject out (silent aspiration);Other (Comment) Pharyngeal- Mechanical Soft -- Pharyngeal -- Pharyngeal- Regular -- Pharyngeal -- Pharyngeal- Multi-consistency -- Pharyngeal -- Pharyngeal- Pill -- Pharyngeal -- Pharyngeal Comment --  CHL IP CERVICAL ESOPHAGEAL PHASE 11/09/2016 Cervical Esophageal Phase Impaired Pudding Teaspoon -- Pudding Cup -- Honey Teaspoon -- Honey Cup -- Nectar Teaspoon Reduced cricopharyngeal relaxation Nectar Cup -- Nectar Straw -- Thin Teaspoon Reduced cricopharyngeal relaxation Thin Cup Reduced cricopharyngeal relaxation Thin Straw -- Puree Reduced cricopharyngeal relaxation Mechanical Soft -- Regular -- Multi-consistency -- Pill -- Cervical Esophageal Comment -- No flowsheet data found. Happi Overton 11/09/2016, 12:11 PM              Results for orders placed or performed during the hospital encounter of 11/03/16 (from the past 72 hour(s))  Glucose, capillary     Status: None   Collection Time: 11/08/16 12:17 PM   Result Value Ref Range   Glucose-Capillary 81 65 - 99 mg/dL  Glucose, capillary     Status: Abnormal   Collection Time: 11/08/16  4:06 PM  Result Value Ref Range   Glucose-Capillary 114 (H) 65 - 99 mg/dL  Glucose, capillary     Status: Abnormal   Collection Time: 11/08/16  8:07 PM  Result Value Ref Range   Glucose-Capillary 120 (H) 65 - 99 mg/dL   Comment 1 Notify RN   Glucose, capillary     Status: Abnormal   Collection Time: 11/09/16 12:01 AM  Result Value Ref Range   Glucose-Capillary 151 (H) 65 - 99 mg/dL   Comment 1 Notify RN   Glucose, capillary     Status: Abnormal   Collection Time: 11/09/16  4:29 AM  Result Value Ref Range   Glucose-Capillary 131 (H) 65 - 99 mg/dL   Comment 1 Notify RN   Glucose, capillary     Status: Abnormal   Collection Time: 11/09/16  7:58 AM  Result Value Ref Range   Glucose-Capillary 124 (H) 65 - 99 mg/dL  Basic metabolic panel     Status: Abnormal   Collection Time: 11/09/16 11:13 AM  Result Value Ref Range   Sodium 145 135 - 145 mmol/L   Potassium 4.2 3.5 - 5.1 mmol/L   Chloride 106 101 - 111 mmol/L   CO2 29 22 - 32 mmol/L   Glucose, Bld 121 (H) 65 - 99 mg/dL   BUN 44 (H) 6 - 20 mg/dL   Creatinine, Ser 0.80 0.44 - 1.00 mg/dL   Calcium 9.7 8.9 - 10.3 mg/dL   GFR calc non Af Amer >60 >60 mL/min   GFR calc Af Amer >60 >60 mL/min    Comment: (NOTE) The eGFR has been calculated using the CKD EPI equation. This calculation has not been validated in all clinical situations. eGFR's persistently <60 mL/min signify possible Chronic Kidney Disease.    Anion gap 10 5 - 15  Glucose, capillary     Status: Abnormal   Collection Time: 11/09/16 11:37 AM  Result Value Ref Range   Glucose-Capillary 122 (H) 65 - 99 mg/dL  Glucose, capillary     Status: None   Collection Time: 11/09/16  3:37 PM  Result Value Ref Range   Glucose-Capillary 99 65 - 99 mg/dL  Glucose, capillary     Status: None   Collection Time: 11/09/16  8:22 PM  Result Value Ref  Range   Glucose-Capillary 86 65 - 99 mg/dL  Glucose, capillary     Status: None   Collection Time: 11/10/16 12:15 AM  Result Value Ref Range   Glucose-Capillary 89 65 - 99 mg/dL  Glucose, capillary     Status: Abnormal   Collection Time: 11/10/16  4:09 AM  Result Value Ref Range   Glucose-Capillary 142 (H) 65 - 99 mg/dL  Glucose, capillary     Status: Abnormal   Collection Time: 11/10/16  9:03 AM  Result Value Ref Range   Glucose-Capillary 118 (H) 65 - 99 mg/dL  Glucose, capillary     Status: Abnormal   Collection Time: 11/10/16 11:55 AM  Result Value Ref Range   Glucose-Capillary 145 (H) 65 - 99 mg/dL  Glucose, capillary     Status: Abnormal   Collection Time: 11/10/16  4:10 PM  Result Value Ref Range   Glucose-Capillary 119 (H) 65 - 99 mg/dL  Glucose, capillary     Status: Abnormal   Collection Time: 11/10/16  7:28 PM  Result Value Ref Range   Glucose-Capillary 119 (H) 65 - 99 mg/dL  Glucose, capillary     Status: Abnormal   Collection Time: 11/10/16 11:32 PM  Result Value Ref Range   Glucose-Capillary 144 (H) 65 - 99 mg/dL  Glucose, capillary     Status: Abnormal   Collection Time: 11/11/16  4:00 AM  Result Value Ref Range   Glucose-Capillary 144 (H) 65 - 99 mg/dL  Glucose, capillary     Status: Abnormal   Collection Time: 11/11/16  8:00 AM  Result Value Ref Range   Glucose-Capillary 110 (H) 65 - 99 mg/dL     HEENT: normal Cardio:.RRR without murmur. No JVD  Resp: CTA Bilaterally without wheezes or rales. Normal effort   GI: BS positive and Nontender and nondistended Extremity:  No Edema Skin:   Intact Neuro: Alert/Oriented and Other Motor strength is 5/5 bilateral deltoid bicep tricep grip hip flexor and extensor ankle dorsiflexor, no dysmetria on finger-nose-finger testing. No nystagmus Musc/Skel:  Other No pain with upper limb or lower limb range of motion General no acute distress   Assessment/Plan: 1. 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 Physiatrist is providing close team supervision and 24 hour management of active medical problems listed below. Physiatrist and rehab team continue to assess barriers to discharge/monitor patient progress toward functional and medical goals. FIM: Function - Bathing Position: Shower Body parts bathed by patient: Right arm, Left arm, Chest, Abdomen, Front perineal area, Buttocks, Right upper leg, Left upper leg, Left lower leg, Right lower leg, Back Body parts bathed by helper: Back Assist Level: Supervision or verbal cues  Function- Upper Body Dressing/Undressing What is the patient wearing?: Pull over shirt/dress, Bra Bra - Perfomed by patient: Thread/unthread right bra strap, Thread/unthread left bra strap, Hook/unhook bra (pull down sports bra) Pull over shirt/dress - Perfomed by patient: Thread/unthread right sleeve, Thread/unthread left sleeve, Put head through opening, Pull shirt over trunk Assist Level: More than reasonable time Function - Lower Body Dressing/Undressing What is the patient wearing?: Underwear, Shoes, Pants Position: Other (comment) (Recliner) Underwear - Performed by patient: Thread/unthread right underwear leg, Thread/unthread left underwear leg, Pull underwear up/down Pants- Performed by patient: Thread/unthread right pants leg, Thread/unthread left pants leg, Pull pants up/down, Fasten/unfasten pants Non-skid slipper socks- Performed by patient: Don/doff right sock, Don/doff left sock Shoes - Performed by patient: Don/doff right shoe, Don/doff left shoe Assist for footwear: Supervision/touching assist Assist for lower body dressing: Supervision or verbal cues  Function - Toileting Toileting steps completed by patient: Adjust clothing prior to toileting, Performs perineal hygiene, Adjust clothing after  toileting Assist level: Supervision or verbal cues  Function - Toilet Transfers Toilet transfer assistive device: Grab bar Assist level to toilet: Supervision or verbal cues Assist level from toilet: Supervision or verbal cues  Function - Chair/bed transfer Chair/bed transfer method: Stand pivot Chair/bed transfer assist level: Supervision or verbal cues Chair/bed transfer assistive device: Armrests Chair/bed transfer details: Tactile cues for posture, Verbal cues for precautions/safety  Function - Locomotion: Wheelchair Will patient use wheelchair at discharge?: No Wheelchair activity did not occur: N/A Wheel 50 feet with 2 turns activity did not occur: N/A Wheel 150 feet activity did not occur: N/A Function - Locomotion: Ambulation Assistive device: No device Max distance: 219f  Assist level: Moderate assist (Pt 50 - 74%) Assist level: Touching or steadying assistance (Pt > 75%) Assist level: Moderate assist (Pt 50 - 74%) Walk 150 feet activity did not occur: Safety/medical concerns Assist level: Moderate assist (Pt 50 - 74%) Assist level: Touching or steadying assistance (Pt > 75%)  Function - Comprehension Comprehension: Auditory Comprehension assist level: Follows complex conversation/direction with extra time/assistive device  Function - Expression Expression: Verbal Expression assist level: Expresses complex ideas: With extra time/assistive device  Function - Social Interaction Social Interaction assist level: Interacts appropriately with others with medication or extra time (anti-anxiety, antidepressant).  Function - Problem Solving Problem solving assist level: Solves complex problems: With extra time  Function - Memory Memory assist level: More than reasonable amount of time Patient normally able to recall (first 3 days only): Current season, Staff names and faces, That he or she is in a hospital, Location of own room  Medical Problem List and Plan: 1.   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 Recommendations of 30 day event monitor as outpatient             -CIR PT, OT, SLP   Team conference today please see physician documentation under team conference tab, met with team face-to-face to discuss problems,progress, and goals. Formulized individual treatment plan based on medical history, underlying problem and comorbidities. 2.  DVT Prophylaxis/Anticoagulation: Subcutaneous heparin.--dc, encourage ambulation, on ASA and Plavix 3. Pain Management: Tylenol as needed 4. Mood: Zoloft 50 mg daily 5. Neuropsych: This patient is capable of making decisions on her own behalf. 6. Skin/Wound Care: Routine skin checks 7. Fluids/Electrolytes/Nutrition: tolerating TF 8. Dysphagia. Patient with tube feeds   -  Repeat MBS 10/29- no improvement  -tolerating NGT without issues at this time  -Needs PEG, otherwise should be ready for d/c soon.  9. Hypertension. Permissive hypertension. Monitor of increased mobility. Patient on Hyzaar 100-12 0.5 mg daily prior to admission. bp's controlled at present Vitals:   11/10/16 2100 11/11/16 0406  BP: 128/68 118/60  Pulse: (!) 8 86  Resp:  16  Temp:  98.5 F (36.9 C)  SpO2: 100% 98%   10. Hyperlipidemia. Lipitor 11. Hypokalemia. 3.4 on 10/24    LOS (Days) 8 A FACE TO FACE EVALUATION WAS PERFORMED  Blessin Kanno E 11/11/2016, 9:50 AM

## 2016-11-11 NOTE — Progress Notes (Signed)
Occupational Therapy Session Note  Patient Details  Name: Nolon Nationsnna K Coolman MRN: [id number] Date of Birth: 06/03/1940  Today's Date: 11/11/2016 OT Individual Time: 1400-1455 OT Individual Time Calculation (min): 55 min    Short Term Goals: Week 1:  OT Short Term Goal 1 (Week 1): LTg=STG  Skilled Therapeutic Interventions/Progress Updates:    Pt seen for OT session focusing on functional ambulation and balance stratigies. Pt sitting in recliner upon arrival with friends present. With encouragement, pt willing to participate in therapy session. She walked throughout unit with distant supervision using rollator.  In gym, practiced ambulation without AD in controlled areas, x2 trials of straight ambulation and x2 trials of ambulating around obstacles. Required min A overall, with occasional mod A due to anterior LOB. Education and VCs provided throughout ambulation for importance of not reaching out to hold onto furniture, increasing stride length, and establishing walking cadence. Pt step improved following cues. Completed x10 step ups onto 4" bock with HHA. Seated rest breaks provided throughout.  She returned to room at end of session, completed oral care in standing  Position. Pt left seated in recliner at end of session, all needs in reach.   Therapy Documentation Precautions:  Precautions Precautions: Fall Restrictions Weight Bearing Restrictions: No Pain:   No/ denies pain ADL: ADL ADL Comments: see functional navigator  See Function Navigator for Current Functional Status.   Therapy/Group: Individual Therapy  [name], Abbye Lao C 11/11/2016, 7:17 AM

## 2016-11-11 NOTE — Patient Care Conference (Signed)
Inpatient RehabilitationTeam Conference and Plan of Care Update Date: 11/11/2016   Time: 10:50 AM    Patient Name: [name]      Medical Record Number: [id number]  Date of Birth: November 21, 1940 Sex: Female         Room/Bed: 4W19C/4W19C-01 Payor Info: Payor: Advertising copywriter MEDICARE / Plan: [facility] MEDICARE / Product Type: *No Product type* /    Admitting Diagnosis: CVA  Admit Date/Time:  11/03/2016  6:46 PM Admission Comments: No comment available   Primary Diagnosis:  <principal problem not specified> Principal Problem: <principal problem not specified>  Patient Active Problem List   Diagnosis Date Noted  . Cerebellar infarction (HCC) 11/03/2016  . Acute ischemic stroke (HCC)   . Benign essential HTN   . Dysarthria, post-stroke   . Leukocytosis   . Acute blood loss anemia   . 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 Acute encephalopathy 10/28/2016  . 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 INSECT BITE 07/04/2009  . HYPERLIPIDEMIA 06/03/2007  . ALLERGIC RHINITIS 06/03/2007  . VAGINITIS, ATROPHIC 06/03/2007  . CONTACT DERMATITIS&OTH ECZEMA DUE OTH SPEC AGENT 06/03/2007  . COLONIC POLYPS, HX OF 06/03/2007    Expected Discharge Date: Expected Discharge Date: 11/18/16  Team Members Present: Physician leading conference: Dr. Claudette Laws Social Worker Present: Dossie Der, LCSW Nurse Present: Carmie End, RN PT Present: Grier Rocher, PT;Rosita Dechalus, PTA OT Present: Johnsie Cancel, OT SLP Present: Reuel Derby, SLP PPS Coordinator present : Tora Duck, RN, CRRN     Current Status/Progress Goal Weekly Team Focus  Medical   swallow function has not improved, has poor balance and high fall risk without walker  . Establish enteral feeding route  ff Plavix for PEG placement   Bowel/Bladder             Swallow/Nutrition/ Hydration   NPO  Mod I  pharyngeal strengthening exercises, trials of water    ADL's   Supervision-min A overall  Mod I overall  Functional ambulation and transfers without AD, activity tolerance, ADL/IADL re-training.    Mobility   minA gait no AD, min guard gait with RW >227ft, min guard transfers,   mod I - ambulatory, transfers  gait with LRAD, balance, endurance   Communication             Safety/Cognition/ Behavioral Observations            Pain             Skin                *See Care Plan and progress notes for long and short-term goals.     Barriers to Discharge  Current Status/Progress Possible Resolutions Date Resolved   Physician    Pending surgery;Nutrition means  scheduled for PEG placement 11/16/2016  progressing towards goals. Physically, but now with swallow  . Continue PT, OT, speech, may repeat modified next week. However, we will need enteral feeding tube for the bulk of hercalories      Nursing                  PT                    OT                  SLP Nutrition means              SW  Discharge Planning/Teaching Needs:  Home with son who can be in and out, swallowing main issue. Ques PEG      Team Discussion:  Doing well with mobility and will reach mod/i. Swallow biggest issues needs a PEG and to be off Plavix for 5 days prior to placement. Scheduled for 11/5-Monday. Still working on balance issues no safety recovery. Will have son come in for education prior to discharge.  Revisions to Treatment Plan:  DC 11/7    Continued Need for Acute Rehabilitation Level of Care: The patient requires daily medical management by a physician with specialized training in physical medicine and rehabilitation for the following conditions: Daily direction of a multidisciplinary physical rehabilitation program to ensure safe treatment while eliciting the highest outcome that is of practical value to the patient.: Yes Daily medical management of patient stability for increased activity during participation in an intensive  rehabilitation regime.: Yes Daily analysis of laboratory values and/or radiology reports with any subsequent need for medication adjustment of medical intervention for : Neurological problems;Nutritional problems  Geraldo Haris, Lemar LivingsRebecca G 11/13/2016, 9:16 AM

## 2016-11-11 NOTE — Progress Notes (Signed)
Physical Therapy Session Note  Patient Details  Name: [name] MRN: [id number] Date of Birth: November 05, 1940  Today's Date: 11/11/2016 PT Individual Time: 0800-0900 PT Individual Time Calculation (min): 60 min   Short Term Goals: Week 1:  PT Short Term Goal 1 (Week 1): STG=LTG  Skilled Therapeutic Interventions/Progress Updates:   Pt received sitting in WC and agreeable to PT.   Gait training with RW to rehab gym with close supervision assist from PT x 140f. PT instructed pt in dynamic gait training with various AD including RW, rollator, SPC and no AD to weave through 6 cones and step over 2 obstacles(1"). Supervision assist with RW and rollator with min cues for AD management. Min assist with SPC with multiple LOB anteriorly and to the L. Min-mod assist without AD with multiple LOB. Pt continues to have poor use of ankle or hip stratgey to prevent LOB with constant Lean to the R.  Gait throughout rehab unit [id number] Rollator and supervision assist from PT with moderate cues for safety with WC parts management and sit<>stand from various surfaces.   Patient returned to room and [facility] and all needs met.         Therapy Documentation Precautions:  Precautions Precautions: Fall Restrictions Weight Bearing Restrictions: No Pain 0/10   See Function Navigator for Current Functional Status.   Therapy/Group: Individual Therapy  AustLorie Phenix31/2018, 11:52 AM

## 2016-11-11 NOTE — Progress Notes (Signed)
Occupational Therapy Session Note  Patient Details  Name: [name] MRN: [id number] Date of Birth: 08/27/1940  Today's Date: 11/11/2016 OT Individual Time: 1045-1130 OT Individual Time Calculation (min): 45 min    Short Term Goals: Week 1:  OT Short Term Goal 1 (Week 1): LTg=STG Week 2:     Skilled Therapeutic Interventions/Progress Updates:    1:1 self care retraining at shower level using Rollator around room for community mobility. Pt able to complete showering with distant supervision and dressing sit to stand from Rollator. . Discussed d/c plans and recommendation for community integration once d/c.   Therapy Documentation Precautions:  Precautions Precautions: Fall Restrictions Weight Bearing Restrictions: No Pain:  no c/o pain  ADL: ADL ADL Comments: see functional navigator Other Treatments:    See Function Navigator for Current Functional Status.   Therapy/Group: Individual Therapy  Roney MansSmith, Azelie Noguera [facility] 11/11/2016, 3:05 PM

## 2016-11-11 NOTE — Progress Notes (Signed)
Spoke with Dr. Wynn BankerKirsteins who contacted neuro about patient's plavix.  This can be held.  According to IR protocol, if plavix can be held, then we will hold it for 5 days and place g-tube after that time.  Therefore, we will plan on g-tube placement on Monday 11-5.  Jameison Haji E 11:23 AM 11/11/2016

## 2016-11-12 ENCOUNTER — Inpatient Hospital Stay (HOSPITAL_COMMUNITY): Payer: Medicare Other | Admitting: Speech Pathology

## 2016-11-12 ENCOUNTER — Inpatient Hospital Stay (HOSPITAL_COMMUNITY): Payer: Medicare Other | Admitting: Occupational Therapy

## 2016-11-12 ENCOUNTER — Inpatient Hospital Stay (HOSPITAL_COMMUNITY): Payer: Medicare Other | Admitting: Physical Therapy

## 2016-11-12 LAB — CBC
HCT: 34.4 % — ABNORMAL LOW (ref 36.0–46.0)
Hemoglobin: 10.7 g/dL — ABNORMAL LOW (ref 12.0–15.0)
MCH: 29.9 pg (ref 26.0–34.0)
MCHC: 31.1 g/dL (ref 30.0–36.0)
MCV: 96.1 fL (ref 78.0–100.0)
PLATELETS: 411 10*3/uL — AB (ref 150–400)
RBC: 3.58 MIL/uL — ABNORMAL LOW (ref 3.87–5.11)
RDW: 15.6 % — AB (ref 11.5–15.5)
WBC: 9.7 10*3/uL (ref 4.0–10.5)

## 2016-11-12 LAB — GLUCOSE, CAPILLARY
GLUCOSE-CAPILLARY: 116 mg/dL — AB (ref 65–99)
GLUCOSE-CAPILLARY: 138 mg/dL — AB (ref 65–99)
Glucose-Capillary: 109 mg/dL — ABNORMAL HIGH (ref 65–99)
Glucose-Capillary: 116 mg/dL — ABNORMAL HIGH (ref 65–99)
Glucose-Capillary: 123 mg/dL — ABNORMAL HIGH (ref 65–99)
Glucose-Capillary: 134 mg/dL — ABNORMAL HIGH (ref 65–99)
Glucose-Capillary: 142 mg/dL — ABNORMAL HIGH (ref 65–99)

## 2016-11-12 LAB — PROTIME-INR
INR: 0.96
PROTHROMBIN TIME: 12.7 s (ref 11.4–15.2)

## 2016-11-12 NOTE — Progress Notes (Signed)
Subjective/Complaints:  Have input from IR and Neuro re anticoag and PEG, discussed pros and cons with pt  ROS: pt denies nausea, vomiting, diarrhea, cough, shortness of breath or chest pain   Objective: Vital Signs: Blood pressure 135/63, pulse 84, temperature 98 F (36.7 C), temperature source Oral, resp. rate 17, weight 59.3 kg (130 lb 12.8 oz), SpO2 100 %. Ct Abdomen Wo Contrast  Result Date: 11/10/2016 CLINICAL DATA:  Dysphagia, assess for fluoroscopic gastrostomy EXAM: CT ABDOMEN WITHOUT CONTRAST TECHNIQUE: Multidetector CT imaging of the abdomen was performed following the standard protocol without IV contrast. COMPARISON:  11/20/2004 FINDINGS: Lower chest: Minor bibasilar atelectasis. Normal heart size. No pericardial or pleural effusion. Hepatobiliary: Remote cholecystectomy. No definite focal hepatic abnormality within the limits of noncontrast imaging. Diffuse biliary dilatation noted, common bile duct measures up to 17 mm. Suspect postcholecystectomy related. Pancreas: Unremarkable. No pancreatic ductal dilatation or surrounding inflammatory changes. Spleen: Normal in size without focal abnormality. Adrenals/Urinary Tract: Stable small 8 mm left adrenal nodule, suspect adenoma. The right adrenal gland appears normal. No definite renal obstruction or hydronephrosis. Collecting systems are prominent bilaterally. No hydroureter. Small exophytic right renal cyst suspected measuring 10 mm, image 31. Stomach/Bowel: Feeding tube tip is in the mid duodenum. Duodenum diverticulum noted. Negative for bowel obstruction, significant dilatation, ileus, or free air. Colonic diverticulosis noted. Stomach is decompressed. No hiatal hernia. Stomach is within the anterior abdomen inferior to the xiphoid and left hepatic lobe. Stomach is also superior to the colon. Stomach anatomy is amenable to percutaneous fluoroscopic gastrostomy technique. No fluid collection or abscess. Vascular/Lymphatic: Abdominal  atherosclerosis evident. Negative for aneurysm. No significant adenopathy within the limits of noncontrast imaging. Other: No ascites or abdominal wall hernias. Musculoskeletal: Extensive artifact from previous posterior spinal fusion spanning several levels. No definite acute osseous finding. IMPRESSION: Stomach anatomy is amenable to percutaneous fluoroscopic gastrostomy technique. No other acute intra-abdominal finding by noncontrast CT. Abdominal atherosclerosis without aneurysm Colonic diverticulosis Remote cholecystectomy. Diffuse biliary dilatation, suspect postcholecystectomy related. Electronically Signed   By: Jerilynn Mages.  Shick M.D.   On: 11/10/2016 14:25   Results for orders placed or performed during the hospital encounter of 11/03/16 (from the past 72 hour(s))  Basic metabolic panel     Status: Abnormal   Collection Time: 11/09/16 11:13 AM  Result Value Ref Range   Sodium 145 135 - 145 mmol/L   Potassium 4.2 3.5 - 5.1 mmol/L   Chloride 106 101 - 111 mmol/L   CO2 29 22 - 32 mmol/L   Glucose, Bld 121 (H) 65 - 99 mg/dL   BUN 44 (H) 6 - 20 mg/dL   Creatinine, Ser 0.80 0.44 - 1.00 mg/dL   Calcium 9.7 8.9 - 10.3 mg/dL   GFR calc non Af Amer >60 >60 mL/min   GFR calc Af Amer >60 >60 mL/min    Comment: (NOTE) The eGFR has been calculated using the CKD EPI equation. This calculation has not been validated in all clinical situations. eGFR's persistently <60 mL/min signify possible Chronic Kidney Disease.    Anion gap 10 5 - 15  Glucose, capillary     Status: Abnormal   Collection Time: 11/09/16 11:37 AM  Result Value Ref Range   Glucose-Capillary 122 (H) 65 - 99 mg/dL  Glucose, capillary     Status: None   Collection Time: 11/09/16  3:37 PM  Result Value Ref Range   Glucose-Capillary 99 65 - 99 mg/dL  Glucose, capillary     Status: None   Collection Time:  11/09/16  8:22 PM  Result Value Ref Range   Glucose-Capillary 86 65 - 99 mg/dL  Glucose, capillary     Status: None   Collection  Time: 11/10/16 12:15 AM  Result Value Ref Range   Glucose-Capillary 89 65 - 99 mg/dL  Glucose, capillary     Status: Abnormal   Collection Time: 11/10/16  4:09 AM  Result Value Ref Range   Glucose-Capillary 142 (H) 65 - 99 mg/dL  Glucose, capillary     Status: Abnormal   Collection Time: 11/10/16  9:03 AM  Result Value Ref Range   Glucose-Capillary 118 (H) 65 - 99 mg/dL  Glucose, capillary     Status: Abnormal   Collection Time: 11/10/16 11:55 AM  Result Value Ref Range   Glucose-Capillary 145 (H) 65 - 99 mg/dL  Glucose, capillary     Status: Abnormal   Collection Time: 11/10/16  4:10 PM  Result Value Ref Range   Glucose-Capillary 119 (H) 65 - 99 mg/dL  Glucose, capillary     Status: Abnormal   Collection Time: 11/10/16  7:28 PM  Result Value Ref Range   Glucose-Capillary 119 (H) 65 - 99 mg/dL  Glucose, capillary     Status: Abnormal   Collection Time: 11/10/16 11:32 PM  Result Value Ref Range   Glucose-Capillary 144 (H) 65 - 99 mg/dL  Glucose, capillary     Status: Abnormal   Collection Time: 11/11/16  4:00 AM  Result Value Ref Range   Glucose-Capillary 144 (H) 65 - 99 mg/dL  Glucose, capillary     Status: Abnormal   Collection Time: 11/11/16  8:00 AM  Result Value Ref Range   Glucose-Capillary 110 (H) 65 - 99 mg/dL  Glucose, capillary     Status: Abnormal   Collection Time: 11/11/16 11:29 AM  Result Value Ref Range   Glucose-Capillary 136 (H) 65 - 99 mg/dL  Glucose, capillary     Status: Abnormal   Collection Time: 11/11/16  4:17 PM  Result Value Ref Range   Glucose-Capillary 135 (H) 65 - 99 mg/dL  Glucose, capillary     Status: Abnormal   Collection Time: 11/11/16  7:50 PM  Result Value Ref Range   Glucose-Capillary 112 (H) 65 - 99 mg/dL  Glucose, capillary     Status: Abnormal   Collection Time: 11/11/16 11:56 PM  Result Value Ref Range   Glucose-Capillary 140 (H) 65 - 99 mg/dL  Glucose, capillary     Status: Abnormal   Collection Time: 11/12/16  3:59 AM   Result Value Ref Range   Glucose-Capillary 134 (H) 65 - 99 mg/dL  Glucose, capillary     Status: Abnormal   Collection Time: 11/12/16  8:02 AM  Result Value Ref Range   Glucose-Capillary 142 (H) 65 - 99 mg/dL     HEENT: normal Cardio:.RRR without murmur. No JVD  Resp: CTA Bilaterally without wheezes or rales. Normal effort   GI: BS positive and Nontender and nondistended Extremity:  No Edema Skin:   Intact Neuro: Alert/Oriented and Other Motor strength is 5/5 bilateral deltoid bicep tricep grip hip flexor and extensor ankle dorsiflexor, no dysmetria on finger-nose-finger testing. No nystagmus Musc/Skel:  Other No pain with upper limb or lower limb range of motion General no acute distress   Assessment/Plan: 1. 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 Physiatrist is providing close team supervision and  24 hour management of active medical problems listed below. Physiatrist and rehab team continue to assess barriers to discharge/monitor patient progress toward functional and medical goals. FIM: Function - Bathing Position: Shower Body parts bathed by patient: Right arm, Left arm, Chest, Abdomen, Front perineal area, Buttocks, Right upper leg, Left upper leg, Left lower leg, Right lower leg, Back Body parts bathed by helper: Back Assist Level: Supervision or verbal cues  Function- Upper Body Dressing/Undressing What is the patient wearing?: Pull over shirt/dress, Bra Bra - Perfomed by patient: Thread/unthread right bra strap, Thread/unthread left bra strap, Hook/unhook bra (pull down sports bra) Pull over shirt/dress - Perfomed by patient: Thread/unthread right sleeve, Thread/unthread left sleeve, Put head through opening, Pull shirt over trunk Assist Level: More than reasonable time Function - Lower Body  Dressing/Undressing What is the patient wearing?: Underwear, Shoes, Pants Position: Other (comment) (Recliner) Underwear - Performed by patient: Thread/unthread right underwear leg, Thread/unthread left underwear leg, Pull underwear up/down Pants- Performed by patient: Thread/unthread right pants leg, Thread/unthread left pants leg, Pull pants up/down, Fasten/unfasten pants Non-skid slipper socks- Performed by patient: Don/doff right sock, Don/doff left sock Shoes - Performed by patient: Don/doff right shoe, Don/doff left shoe Assist for footwear: Supervision/touching assist Assist for lower body dressing: Supervision or verbal cues  Function - Toileting Toileting steps completed by patient: Adjust clothing prior to toileting, Performs perineal hygiene, Adjust clothing after toileting Assist level: Supervision or verbal cues  Function Midwife transfer assistive device: Grab bar Assist level to toilet: Supervision or verbal cues Assist level from toilet: Supervision or verbal cues  Function - Chair/bed transfer Chair/bed transfer method: Stand pivot Chair/bed transfer assist level: Supervision or verbal cues Chair/bed transfer assistive device: Armrests Chair/bed transfer details: Tactile cues for posture, Verbal cues for precautions/safety  Function - Locomotion: Wheelchair Will patient use wheelchair at discharge?: No Wheelchair activity did not occur: N/A Wheel 50 feet with 2 turns activity did not occur: N/A Wheel 150 feet activity did not occur: N/A Function - Locomotion: Ambulation Assistive device: No device Max distance: 28f  Assist level: Moderate assist (Pt 50 - 74%) Assist level: Touching or steadying assistance (Pt > 75%) Assist level: Moderate assist (Pt 50 - 74%) Walk 150 feet activity did not occur: Safety/medical concerns Assist level: Moderate assist (Pt 50 - 74%) Assist level: Touching or steadying assistance (Pt > 75%)  Function -  Comprehension Comprehension: Auditory Comprehension assist level: Follows complex conversation/direction with extra time/assistive device  Function - Expression Expression: Verbal Expression assist level: Expresses complex ideas: With extra time/assistive device  Function - Social Interaction Social Interaction assist level: Interacts appropriately with others with medication or extra time (anti-anxiety, antidepressant).  Function - Problem Solving Problem solving assist level: Solves complex problems: With extra time  Function - Memory Memory assist level: More than reasonable amount of time Patient normally able to recall (first 3 days only): Current season, Staff names and faces, That he or she is in a hospital, Location of own room  Medical Problem List and Plan: 1.  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 Recommendations of 30 day event monitor as outpatient             -CIR PT, OT, SLP   2.  DVT Prophylaxis/Anticoagulation: Subcutaneous heparin.--dc, encourage ambulation, on ASA and Plavix 3. Pain Management: Tylenol as needed 4. Mood: Zoloft 50 mg daily 5. Neuropsych: This patient is capable of making decisions on her  own behalf. 6. Skin/Wound Care: Routine skin checks 7. Fluids/Electrolytes/Nutrition: tolerating TF 8. Dysphagia. Patient with tube feeds   -  Repeat MBS 10/29- no improvement  -PEG scheduled for 11/5                        9. Hypertension. Permissive hypertension. Monitor of increased mobility. Patient on Hyzaar 100-12 0.5 mg daily prior to admission. bp's controlled 11/1 Vitals:   11/11/16 2053 11/12/16 0415  BP: 135/73 135/63  Pulse: 66 84  Resp:    Temp:  98 F (36.7 C)  SpO2: 100% 100%   10. Hyperlipidemia. Lipitor 11. Hypokalemia. 3.4 on 10/24    LOS (Days) 9 A FACE TO FACE EVALUATION WAS PERFORMED  [name] E 11/12/2016, 8:25 AM

## 2016-11-12 NOTE — Progress Notes (Signed)
Physical Therapy Session Note  Patient Details  Name: [name] MRN: [id number] Date of Birth: [birth year]  Today's Date: 11/12/2016 PT Individual Time: 1500-1600 PT Individual Time Calculation (min): 60 min   Short Term Goals: Week 1:  PT Short Term Goal 1 (Week 1): STG=LTG  Skilled Therapeutic Interventions/Progress Updates:   Pt received sitting in WC and agreeable to PT  Gait training with Rollator to rehab gym with supervision assist from PT.   Dynamic gait training without AD to weave through 6 cones x 4 with min-mod assist from PT. Moderate cues for gait pattern and assist for imrpoved weight shifting to the L to prevent LOB.   4 square balance training forward/backward x 6 Bilaterally, side stepping x 8 Bilaterall. Square stepping x 4 in both directions. Min-mod assist from PT with poor use of ankle or hip strategy to prevent LOB.   Wii fit balance training to improve use of ankle strategy to shift weight and improve safety with dynamic balance tasks. Penguin slide x 3, tilt table x 2. Min assist from PT with noted improvement in weight shifting.   Gait training with rollator back to room with supervision assist from PT.  Patient returned to room and left sitting in [facility] with call bell in reach and all needs met.        Therapy Documentation Precautions:  Precautions Precautions: Fall Restrictions Weight Bearing Restrictions: No Pain: 0/10    Vital Signs: Therapy Vitals Temp: 98.1 F (36.7 C) Temp Source: Oral Pulse Rate: 98 Resp: 18 BP: 130/64 Patient Position (if appropriate): Sitting Oxygen Therapy SpO2: 96 % O2 Device: Not Delivered   See Function Navigator for Current Functional Status.   Therapy/Group: Individual Therapy  Lorie Phenix 11/12/2016, 4:18 PM

## 2016-11-12 NOTE — Progress Notes (Signed)
Speech Language Pathology Daily Session Note  Patient Details  Name: [name] MRN: [id number] Date of Birth: 09/24/1940  Today's Date: 11/12/2016   Skilled treatment session #1 SLP Individual Time: 0800-0830 SLP Individual Time Calculation (min): 30 min   Skilled treatment session #2 SLP Individual time: 1130-1200 SLP Individual Time Calculation (min): 30 min  Short Term Goals: Week 2: SLP Short Term Goal 1 (Week 2): Pt will demonstrate readiness for repeat MBS as evidenced by minimal overt s/s of aspiration with PO trials and mod I use of swallowing precautions.   SLP Short Term Goal 2 (Week 2): Pt will complete 100 repetitions of pharyngeal strengthening exercises with mod I each session.   SLP Short Term Goal 3 (Week 2): Pt will complete 25 repetitions of EMST with mod I use of device and a self perceived effort level of <7/10.    Skilled Therapeutic Interventions:  Skilled treatment session #1 focused on dysphagia goals and review of POC. SLP facilitated session by providing ice chip stimulation to facilitate swallow during CTAR exercises. Pt has just completed oral care. Pt was able to perform 1 set of 10 CTAR with swallow produced during resistance. Pt with coughing and oral suction on last ice chip. Pt's RMT resistance reassessed and increased to slightly over 30 cmH2O. Pt able to perform 15 reps with self-perceived effort of 8 out of 10.  Current POC is to have PEG placed on Monday 11/5 and if pt's swallow appears improved at bedside, might repeat instrumental swallow assessment on 11/6.  Skilled treatment session #2 focused on dysphagia goals. SLP facilitated session by providing skilled observation of pt consuming 1/2 pieces of ice chips to facilitate CTAR for 10 reps. Pt with continuous throat clear throughout session with need for suction at end of 10 1/2 pieces of ice chips. Continued education provided. Pt left upright in recliner with all needs within reach. Continue per  current plan of care.      Function:  Eating Eating   Modified Consistency Diet: No (ice chips with SLP only) Eating Assist Level: Supervision or verbal cues           Cognition Comprehension Comprehension assist level: Follows complex conversation/direction with extra time/assistive device  Expression   Expression assist level: Expresses complex ideas: With extra time/assistive device  Social Interaction Social Interaction assist level: Interacts appropriately with others with medication or extra time (anti-anxiety, antidepressant).  Problem Solving Problem solving assist level: Solves complex problems: With extra time  Memory Memory assist level: More than reasonable amount of time    Pain    Therapy/Group: Individual Therapy  Niya Behler 11/12/2016, 8:48 AM

## 2016-11-12 NOTE — Progress Notes (Signed)
Social Work Patient ID: [name], female   DOB: 1940-08-01, 76 y.o.   MRN: [id number]  Met with pt to discuss team conference goals mod/i and discharge target 11/7 if medically ready and PEG working well. PEG to be placed on Monday and will need to do teaching with pt, son and pt's friend prior to discharge. Pt is doing well with her mobility and just wishes her swallow would do well also. Will work on discharge needs.

## 2016-11-12 NOTE — Progress Notes (Signed)
Occupational Therapy Weekly Progress Note  Patient Details  Name: [name] MRN: [id number] Date of Birth: 06/07/1940  Beginning of progress report period: November 04, 2016 End of progress report period: November 12, 2016  Today's Date: 11/12/2016 OT Individual Time: [id number] OT Individual Time Calculation (min): 70 min    Short term goals were not established as estimating short LOS. However, due to need for PEG placement, LOS extended. Pt making steady progress towards mod I overall goals. She cont to require increased assist with dynamic balance and functional ambulation without AD, requiring min-mod A for these tasks.   Patient continues to demonstrate the following deficits:muscle weakness (generalized) and therefore will continue to benefit from skilled OT intervention to enhance overall performance with BADL and iADL.  Patient progressing toward long term goals..  Continue plan of care.  OT Short Term Goals Week 1:  OT Short Term Goal 1 (Week 1): LTG=STG OT Short Term Goal 1 - Progress (Week 1): Progressing toward goal Week 2:  OT Short Term Goal 1 (Week 2): Cont to work towards LTG of Mod I overall  Skilled Therapeutic Interventions/Progress Updates:    PT seen for OT ADL bathing/dressing session. Pt sitting up in w/c upon arrival, ready for tx session and desiring to shower. She ambulated throughout room without AD and min A, required VCs not to reach for furniture for stabilization when ambulating.  She bathed with distant supervision seated on tub bench.  She returned to toilet to dress and completed toileting task with close supervision. Grooming tasks completed seated on rollator with assist for set-up of items.  She ambulated to therapy gym with min A, no AD. Completed x2 standing activities standing on non-compliant foam mat with steadying assist focusing on righting reactions and strengthening/ stabilization. Following rest break, pt returned to room, left seated in  recliner with all needs in reach and RN present.   Therapy Documentation Precautions:  Precautions Precautions: Fall Restrictions Weight Bearing Restrictions: No Pain:   No/ denies pain ADL: ADL ADL Comments: see functional navigator  See Function Navigator for Current Functional Status.   Therapy/Group: Individual Therapy  [name], [name] 11/12/2016, 7:14 AM

## 2016-11-13 ENCOUNTER — Inpatient Hospital Stay (HOSPITAL_COMMUNITY): Payer: Medicare Other | Admitting: Occupational Therapy

## 2016-11-13 ENCOUNTER — Inpatient Hospital Stay (HOSPITAL_COMMUNITY): Payer: Medicare Other | Admitting: Speech Pathology

## 2016-11-13 ENCOUNTER — Inpatient Hospital Stay (HOSPITAL_COMMUNITY): Payer: Medicare Other | Admitting: Physical Therapy

## 2016-11-13 LAB — GLUCOSE, CAPILLARY
GLUCOSE-CAPILLARY: 100 mg/dL — AB (ref 65–99)
GLUCOSE-CAPILLARY: 106 mg/dL — AB (ref 65–99)
GLUCOSE-CAPILLARY: 129 mg/dL — AB (ref 65–99)
GLUCOSE-CAPILLARY: 151 mg/dL — AB (ref 65–99)
GLUCOSE-CAPILLARY: 82 mg/dL (ref 65–99)
Glucose-Capillary: 112 mg/dL — ABNORMAL HIGH (ref 65–99)

## 2016-11-13 NOTE — Progress Notes (Signed)
Speech Language Pathology Daily Session Note  Patient Details  Name: Nolon Nationsnna K Harvill MRN: [id number] Date of Birth: 04/29/1940  Today's Date: 11/13/2016   Skilled treatment session #1 SLP Individual Time: 1000-1030 SLP Individual Time Calculation (min): 30 min   Skilled treatment session #2 SLP Individual Time: 1300-1345 SLP Individual Time Calculation (min): 45 min  Short Term Goals: Week 2: SLP Short Term Goal 1 (Week 2): Pt will demonstrate readiness for repeat MBS as evidenced by minimal overt s/s of aspiration with PO trials and mod I use of swallowing precautions.   SLP Short Term Goal 2 (Week 2): Pt will complete 100 repetitions of pharyngeal strengthening exercises with mod I each session.   SLP Short Term Goal 3 (Week 2): Pt will complete 25 repetitions of EMST with mod I use of device and a self perceived effort level of <7/10.    Skilled Therapeutic Interventions:   Skilled treatment session #1 focused on dysphagia goals. SLP facilitated session by providing skilled observation of pt consuming 1/2 pieces of ice chips. Pt with decreased throat clearing and no oral suctioning needed during session. Improvement over previous sessions. Pt able to demonstrate pharyngeal strengthening exercises (CTAR x 15).   Skilled treatment session #2 focused on dysphagia goals. SLP facilitated session by providing skilled observation of pt consuming 1/2 pieces of ice chips. Pt with coughing and oral suctioning. SLP continued to pose questions requiring previous swallow abilities. Pt frequently states that she ate very small bites and was always last in finishing meals. SLP reviewed MBS images with pt and suspect ostephytes. As pt began processing this information, she stated that she now remembers have frequent small throat clears during meals. SLP further trials nectar thick liquids via spoon for weighted bolus to facilitate possible UES opening. Pt with throat clears but no oral suctioning. SLP gave  trials of water via spoon with head turn to right and throat clears but no oral suctioning needed. SLP also provided verbal, tactile and visual cues to attempt Mendelsohn maneuver. Pt with most difficulty attempting. Will continue targeting swallow deficits. Pt left upright in recliner with all needs within reach. Continue per current plan of care.      Function:  Eating Eating Eating activity did not occur: Safety/medical concerns   Eating Assist Level: Helper performs IV, parenteral or tube feed           Cognition Comprehension Comprehension assist level: Follows complex conversation/direction with extra time/assistive device  Expression   Expression assist level: Expresses complex ideas: With extra time/assistive device  Social Interaction Social Interaction assist level: Interacts appropriately with others with medication or extra time (anti-anxiety, antidepressant).  Problem Solving Problem solving assist level: Solves complex problems: With extra time  Memory Memory assist level: More than reasonable amount of time    Pain Pain Assessment Pain Score: 0-No pain  Therapy/Group: Individual Therapy  Roshaunda Starkey 11/13/2016, 12:57 PM

## 2016-11-13 NOTE — Progress Notes (Signed)
Occupational Therapy Session Note  Patient Details  Name: [name] MRN: [id number] Date of Birth: 06/10/1940  Today's Date: 11/13/2016 OT Individual Time: [id number] OT Individual Time Calculation (min): 75 min    Short Term Goals: Week 2:  OT Short Term Goal 1 (Week 2): Cont to work towards LTG of Mod I overall  Skilled Therapeutic Interventions/Progress Updates:    Pt seen for OT ADL bathing/dressing session. Pt sitting up in recliner upon arrival, agreeable to tx session and desiring to shower. She ambulated throughout room without AD and close supervision/ guarding assist to gather clothing items from low drawers. She bathed seated on tub bench with assist for set-up only. She returned to recliner to dress with distant supervision.  Grooming tasks completed standing at sink. She ambulated with rollator throughout unit to pt laundry facility. Completed laundry task with supervision and min cuing for rollator management.  She completed dynamic standing balance activity utilizing [employer] focusing on weightshift and crossing midline, standing without use of AD, and standing on non-complinat surface. Guarding assist for dynamic balance. Seated rest breaks provided btwn trials. Pt returned to room at end of session, left seated in recliner with all needs in reach. Throughout session, required min-mod cuing for proper use of rollator including locking brakes and how to turn and sit to use rollator seat.  Therapy Documentation Precautions:  Precautions Precautions: Fall Restrictions Weight Bearing Restrictions: No Pain:   No/ denies pain ADL: ADL ADL Comments: see functional navigator  See Function Navigator for Current Functional Status.   Therapy/Group: Individual Therapy  [name], Birch Farino C 11/13/2016, 6:39 AM

## 2016-11-13 NOTE — Progress Notes (Signed)
Therapy requested tube feeding to be disconnected for shower.  Started again and clogged down line.  Attempted to clean and restart but would not start.  Called [facility]rak.  Will attempt to unclog.

## 2016-11-13 NOTE — Progress Notes (Signed)
Physical Therapy Session Note  Patient Details  Name: [name] MRN: [id number] Date of Birth: 01/19/1940  Today's Date: 11/13/2016 PT Individual Time: 0900-1000 PT Individual Time Calculation (min): 60 min   Short Term Goals: Week 1:  PT Short Term Goal 1 (Week 1): STG=LTG  Skilled Therapeutic Interventions/Progress Updates:    Pt received sitting in WC and agreeable to PT  Gait training instructed by PT with Rollator 251f x 2 with supervision assist from PT. Additional gait training instructed by PT without AD x 1072fwith min-mod assist form PT; pt continues to have poor weight shifting as well as poor righting reactions and with anterior LOB. Pt performed laundry task with supervision assist and 1-0 UE support.   Dynamic balance training instructed by PT with min-mod assist for reciprocal stepping to target. Toe tapping on 2 cones with min-mod assist from PT. PT required to facilitate weight shift to the L to prevent LOB. Wii fit balance games  to improve weight shifting L and anteriorly. Mod-max cues for proper ankle strategy to allow improved anterior weight shift.   Throughout treatment, Pt performed sit<>stand and stand pivot transfer with supervision assist.   [facility] returned to room and [facility] reach and all needs met.        Therapy Documentation Precautions:  Precautions Precautions: Fall Restrictions Weight Bearing Restrictions: No Pain: 0/10   See Function Navigator for Current Functional Status.   Therapy/Group: Individual Therapy  AuLorie Phenix1/02/2016, 10:05 AM

## 2016-11-13 NOTE — Progress Notes (Signed)
Subjective/Complaints:  Patient tolerating therapy. Tube feeding clogged today  ROS: pt denies nausea, vomiting, diarrhea, cough, shortness of breath or chest pain   Objective: Vital Signs: Blood pressure 130/64, pulse 98, temperature 98.1 F (36.7 C), temperature source Oral, resp. rate 18, weight 59.3 kg (130 lb 12.8 oz), SpO2 96 %. No results found. Results for orders placed or performed during the hospital encounter of 11/03/16 (from the past 72 hour(s))  Glucose, capillary     Status: Abnormal   Collection Time: 11/10/16  4:10 PM  Result Value Ref Range   Glucose-Capillary 119 (H) 65 - 99 mg/dL  Glucose, capillary     Status: Abnormal   Collection Time: 11/10/16  7:28 PM  Result Value Ref Range   Glucose-Capillary 119 (H) 65 - 99 mg/dL  Glucose, capillary     Status: Abnormal   Collection Time: 11/10/16 11:32 PM  Result Value Ref Range   Glucose-Capillary 144 (H) 65 - 99 mg/dL  Glucose, capillary     Status: Abnormal   Collection Time: 11/11/16  4:00 AM  Result Value Ref Range   Glucose-Capillary 144 (H) 65 - 99 mg/dL  Glucose, capillary     Status: Abnormal   Collection Time: 11/11/16  8:00 AM  Result Value Ref Range   Glucose-Capillary 110 (H) 65 - 99 mg/dL  Glucose, capillary     Status: Abnormal   Collection Time: 11/11/16 11:29 AM  Result Value Ref Range   Glucose-Capillary 136 (H) 65 - 99 mg/dL  Glucose, capillary     Status: Abnormal   Collection Time: 11/11/16  4:17 PM  Result Value Ref Range   Glucose-Capillary 135 (H) 65 - 99 mg/dL  Glucose, capillary     Status: Abnormal   Collection Time: 11/11/16  7:50 PM  Result Value Ref Range   Glucose-Capillary 112 (H) 65 - 99 mg/dL  Glucose, capillary     Status: Abnormal   Collection Time: 11/11/16 11:56 PM  Result Value Ref Range   Glucose-Capillary 140 (H) 65 - 99 mg/dL  Glucose, capillary     Status: Abnormal   Collection Time: 11/12/16  3:59 AM  Result Value Ref Range   Glucose-Capillary 134 (H) 65 - 99  mg/dL  Glucose, capillary     Status: Abnormal   Collection Time: 11/12/16  8:02 AM  Result Value Ref Range   Glucose-Capillary 142 (H) 65 - 99 mg/dL  CBC     Status: Abnormal   Collection Time: 11/12/16  8:44 AM  Result Value Ref Range   WBC 9.7 4.0 - 10.5 K/uL   RBC 3.58 (L) 3.87 - 5.11 MIL/uL   Hemoglobin 10.7 (L) 12.0 - 15.0 g/dL   HCT 78.2 (L) 95.6 - 21.3 %   MCV 96.1 78.0 - 100.0 fL   MCH 29.9 26.0 - 34.0 pg   MCHC 31.1 30.0 - 36.0 g/dL   RDW 08.6 (H) 57.8 - 46.9 %   Platelets 411 (H) 150 - 400 K/uL  Protime-INR     Status: None   Collection Time: 11/12/16  8:44 AM  Result Value Ref Range   Prothrombin Time 12.7 11.4 - 15.2 seconds   INR 0.96   Glucose, capillary     Status: Abnormal   Collection Time: 11/12/16 11:43 AM  Result Value Ref Range   Glucose-Capillary 123 (H) 65 - 99 mg/dL  Glucose, capillary     Status: Abnormal   Collection Time: 11/12/16  4:13 PM  Result Value Ref Range  Glucose-Capillary 109 (H) 65 - 99 mg/dL  Glucose, capillary     Status: Abnormal   Collection Time: 11/12/16  8:15 PM  Result Value Ref Range   Glucose-Capillary 116 (H) 65 - 99 mg/dL  Glucose, capillary     Status: Abnormal   Collection Time: 11/12/16  9:11 PM  Result Value Ref Range   Glucose-Capillary 138 (H) 65 - 99 mg/dL  Glucose, capillary     Status: Abnormal   Collection Time: 11/13/16 12:02 AM  Result Value Ref Range   Glucose-Capillary 116 (H) 65 - 99 mg/dL  Glucose, capillary     Status: Abnormal   Collection Time: 11/13/16  4:09 AM  Result Value Ref Range   Glucose-Capillary 151 (H) 65 - 99 mg/dL  Glucose, capillary     Status: Abnormal   Collection Time: 11/13/16  8:28 AM  Result Value Ref Range   Glucose-Capillary 100 (H) 65 - 99 mg/dL  Glucose, capillary     Status: Abnormal   Collection Time: 11/13/16 11:51 AM  Result Value Ref Range   Glucose-Capillary 112 (H) 65 - 99 mg/dL     HEENT: normal Cardio:.RRR without murmur. No JVD  Resp: CTA Bilaterally  without wheezes or rales. Normal effort   GI: BS positive and Nontender and nondistended Extremity:  No Edema Skin:   Intact Neuro: Alert/Oriented and Other Motor strength is 5/5 bilateral deltoid bicep tricep grip hip flexor and extensor ankle dorsiflexor, no dysmetria on finger-nose-finger testing. No nystagmus Musc/Skel:  Other No pain with upper limb or lower limb range of motion General no acute distress   Assessment/Plan: 1. 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 Physiatrist is providing close team supervision and 24 hour management of active medical problems listed below. Physiatrist and rehab team continue to assess barriers to discharge/monitor patient progress toward functional and medical goals. FIM: Function - Bathing Position: Shower Body parts bathed by patient: Right arm, Left arm, Chest, Abdomen, Front perineal area, Buttocks, Right upper leg, Left upper leg, Left lower leg, Right lower leg, Back Body parts bathed by helper: Back Assist Level: Set up Set up : To obtain items  Function- Upper Body Dressing/Undressing What is the patient wearing?: Pull over shirt/dress, Bra Bra - Perfomed by patient: Thread/unthread right bra strap, Thread/unthread left bra strap, Hook/unhook bra (pull down sports bra) Pull over shirt/dress - Perfomed by patient: Thread/unthread right sleeve, Thread/unthread left sleeve, Put head through opening, Pull shirt over trunk Assist Level: More than reasonable time Function - Lower Body Dressing/Undressing What is the patient wearing?: Underwear, Shoes, Pants Position: Other (comment) (Recliner) Underwear - Performed by patient: Thread/unthread right underwear leg, Thread/unthread left underwear leg, Pull underwear up/down Pants- Performed by patient: Thread/unthread right pants  leg, Thread/unthread left pants leg, Pull pants up/down, Fasten/unfasten pants Non-skid slipper socks- Performed by patient: Don/doff right sock, Don/doff left sock Shoes - Performed by patient: Don/doff right shoe, Don/doff left shoe Assist for footwear: Setup Assist for lower body dressing: Supervision or verbal cues  Function - Toileting Toileting steps completed by patient: Adjust clothing prior to toileting, Performs perineal hygiene, Adjust clothing after toileting Toileting Assistive Devices: Grab bar or rail Assist level: Supervision or verbal cues  Function - Archivist transfer assistive device: Grab bar Assist level to toilet: Supervision or verbal cues Assist level from toilet: Supervision or verbal cues  Function -  Chair/bed transfer Chair/bed transfer method: Stand pivot Chair/bed transfer assist level: Supervision or verbal cues Chair/bed transfer assistive device: Armrests Chair/bed transfer details: Tactile cues for posture, Verbal cues for precautions/safety  Function - Locomotion: Wheelchair Will patient use wheelchair at discharge?: No Wheelchair activity did not occur: N/A Wheel 50 feet with 2 turns activity did not occur: N/A Wheel 150 feet activity did not occur: N/A Function - Locomotion: Ambulation Assistive device: No device Max distance: 26400ft  Assist level: Moderate assist (Pt 50 - 74%) Assist level: Touching or steadying assistance (Pt > 75%) Assist level: Moderate assist (Pt 50 - 74%) Walk 150 feet activity did not occur: Safety/medical concerns Assist level: Moderate assist (Pt 50 - 74%) Assist level: Touching or steadying assistance (Pt > 75%)  Function - Comprehension Comprehension: Auditory Comprehension assist level: Follows complex conversation/direction with extra time/assistive device  Function - Expression Expression: Verbal Expression assist level: Expresses complex ideas: With extra time/assistive device  Function -  Social Interaction Social Interaction assist level: Interacts appropriately with others with medication or extra time (anti-anxiety, antidepressant).  Function - Problem Solving Problem solving assist level: Solves complex problems: With extra time  Function - Memory Memory assist level: More than reasonable amount of time Patient normally able to recall (first 3 days only): Current season, Staff names and faces, That he or she is in a hospital, Location of own room  Medical Problem List and Plan: 1.  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 Recommendations of 30 day event monitor as outpatient             -CIR PT, OT, SLP   2.  DVT Prophylaxis/Anticoagulation: Subcutaneous heparin.--dc, encourage ambulation, on ASA and Plavix 3. Pain Management: Tylenol as needed 4. Mood: Zoloft 50 mg daily 5. Neuropsych: This patient is capable of making decisions on her own behalf. 6. Skin/Wound Care: Routine skin checks 7. Fluids/Electrolytes/Nutrition: tolerating TF 8. Dysphagia. Patient with tube feeds   -  Repeat MBS 10/29- no improvement  -PEG scheduled for 11/5                        9. Hypertension. Permissive hypertension. Monitor of increased mobility. Patient on Hyzaar 100-12 0.5 mg daily prior to admission. bp's controlled 11/1 Vitals:   11/12/16 0415 11/12/16 1429  BP: 135/63 130/64  Pulse: 84 98  Resp:  18  Temp: 98 F (36.7 C) 98.1 F (36.7 C)  SpO2: 100% 96%   10. Hyperlipidemia. Lipitor 11. Hypokalemia. 3.4 on 10/24    LOS (Days) 10 A FACE TO FACE EVALUATION WAS PERFORMED  Olanda Downie E 11/13/2016, 1:04 PM

## 2016-11-14 ENCOUNTER — Inpatient Hospital Stay (HOSPITAL_COMMUNITY): Payer: Medicare Other | Admitting: Physical Therapy

## 2016-11-14 ENCOUNTER — Inpatient Hospital Stay (HOSPITAL_COMMUNITY): Payer: Medicare Other

## 2016-11-14 LAB — GLUCOSE, CAPILLARY
GLUCOSE-CAPILLARY: 112 mg/dL — AB (ref 65–99)
GLUCOSE-CAPILLARY: 114 mg/dL — AB (ref 65–99)
GLUCOSE-CAPILLARY: 122 mg/dL — AB (ref 65–99)
GLUCOSE-CAPILLARY: 97 mg/dL (ref 65–99)
Glucose-Capillary: 154 mg/dL — ABNORMAL HIGH (ref 65–99)
Glucose-Capillary: 129 mg/dL — ABNORMAL HIGH (ref 65–99)

## 2016-11-14 NOTE — Progress Notes (Addendum)
Speech Language Pathology Daily Session Note  Patient Details  Name: [name] MRN: [id number] Date of Birth: 11/03/1940  Today's Date: 11/14/2016 SLP Individual Time: 1415-1430 SLP Individual Time Calculation (min): 15 min  Short Term Goals: Week 2: SLP Short Term Goal 1 (Week 2): Pt will demonstrate readiness for repeat MBS as evidenced by minimal overt s/s of aspiration with PO trials and mod I use of swallowing precautions.   SLP Short Term Goal 2 (Week 2): Pt will complete 100 repetitions of pharyngeal strengthening exercises with mod I each session.   SLP Short Term Goal 3 (Week 2): Pt will complete 25 repetitions of EMST with mod I use of device and a self perceived effort level of <7/10.    Skilled Therapeutic Interventions: Skilled treatment session focused on dysphagia goals. SLP facilitated session by providing small trial sips of room temperature water with head turn to right. Pt with mild increase in ability but continues to cough, throat clear and expectorate after swallow. She declined nectar thick d/t taste/texture. Pt returned to room, left at sink to continue to hock up mucous on back of throat. Recommend FEES on Tuesday or Wednesday if MD agreeable.      Function:  Eating Eating   Modified Consistency Diet: No             Cognition Comprehension Comprehension assist level: Follows complex conversation/direction with extra time/assistive device  Expression   Expression assist level: Expresses complex ideas: With extra time/assistive device  Social Interaction Social Interaction assist level: Interacts appropriately with others - No medications needed.  Problem Solving Problem solving assist level: Solves complex problems: With extra time  Memory Memory assist level: More than reasonable amount of time    Pain    Therapy/Group: Individual Therapy  Elih Mooney 11/14/2016, 3:23 PM

## 2016-11-14 NOTE — Progress Notes (Signed)
Occupational Therapy Session Note  Patient Details  Name: [name] MRN: [id number] Date of Birth: March 04, 1940  Today's Date: 11/14/2016 OT Individual Time: 2715-6648 OT Individual Time Calculation (min): 42 min    Short Term Goals: Week 2:  OT Short Term Goal 1 (Week 2): Cont to work towards LTG of Mod I overall  Skilled Therapeutic Interventions/Progress Updates:    1:1. Pt ambualtes with rollator to dayroom with supervision throughout and Vc to lock rollator brakes prior to sitting down. Pt stands to complete [facility] activity with RUE manipulating push pins and matching them to sorted colors on mounted easel. Pt vacuums without AD with 1 small LOB to R that pt able to correct without A to improve endurance and engage in IADL. Exited session with pt seated in recliner with call light in reach and all needs met.   Therapy Documentation Precautions:  Precautions Precautions: Fall Restrictions Weight Bearing Restrictions: No  See Function Navigator for Current Functional Status.   Therapy/Group: Individual Therapy  [name] 11/14/2016, 6:59 AM

## 2016-11-14 NOTE — Progress Notes (Signed)
Patient ID: [name], female   DOB: 09/18/1940, 76 y.o.   MRN: [id number]   11/14/16.  [name] is a 76 y.o. 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  Past Medical History:  Diagnosis Date  . Arthritis   . Hypertension     Subjective: No new complaints. No new problems. Slept well. Feeling OK.  Objective: Vital signs in last 24 hours: Temp:  [97.7 F (36.5 C)-98.3 F (36.8 C)] 97.7 F (36.5 C) (11/03 0454) Pulse Rate:  [98-100] 98 (11/03 0454) Resp:  [18] 18 (11/03 0454) BP: (125-136)/(69-71) 125/69 (11/03 0454) SpO2:  [97 %-98 %] 98 % (11/03 0454) Weight:  [132 lb 15 oz (60.3 kg)] 132 lb 15 oz (60.3 kg) (11/03 0454) Weight change: 2 lb 3.3 oz (1 kg) Last BM Date: 11/12/16  Intake/Output from previous day: 11/02 0701 - 11/03 0700 In: -  Out: 1000 [Urine:1000] Last cbgs: CBG (last 3)   Recent Labs  11/13/16 2334 11/14/16 0419 11/14/16 0830  GLUCAP 129* 154* 129*    Vitals:   11/12/16 1429 11/13/16 0400 11/13/16 1429 11/14/16 0454  BP: 130/64 (!) 142/66 136/71 125/69  Pulse: 98 (!) 101 100 98  Resp: 18 14 18 18   Temp: 98.1 F (36.7 C) 97.6 F (36.4 C) 98.3 F (36.8 C) 97.7 F (36.5 C)  TempSrc: Oral Oral Oral Oral  SpO2: 96% 99% 97% 98%  Weight:  130 lb 11.7 oz (59.3 kg)  132 lb 15 oz (60.3 kg)    Physical Exam General: No apparent distress   HEENT: unremarkable  NGT in place Lungs: Normal effort. Lungs clear to auscultation, no crackles or wheezes. Cardiovascular: Regular rate and rhythm, no edema Abdomen: S/NT/ND; BS(+) Musculoskeletal:  unchanged Neurological: No new neurological deficits Skin: clear  Mental state: Alert, oriented, cooperative    Lab Results: BMET    Component Value Date/Time   NA 145 11/09/2016 1113   K 4.2 11/09/2016 1113   CL 106 11/09/2016 1113   CO2 29 11/09/2016 1113   GLUCOSE 121 (H) 11/09/2016 1113   BUN 44 (H)  11/09/2016 1113   CREATININE 0.80 11/09/2016 1113   CALCIUM 9.7 11/09/2016 1113   GFRNONAA >60 11/09/2016 1113   GFRAA >60 11/09/2016 1113   CBC    Component Value Date/Time   WBC 9.7 11/12/2016 0844   RBC 3.58 (L) 11/12/2016 0844   HGB 10.7 (L) 11/12/2016 0844   HCT 34.4 (L) 11/12/2016 0844   PLT 411 (H) 11/12/2016 0844   MCV 96.1 11/12/2016 0844   MCH 29.9 11/12/2016 0844   MCHC 31.1 11/12/2016 0844   RDW 15.6 (H) 11/12/2016 0844   LYMPHSABS 1.6 11/04/2016 0621   MONOABS 1.3 (H) 11/04/2016 0621   EOSABS 0.1 11/04/2016 0621   BASOSABS 0.0 11/04/2016 0621    Medications: I have reviewed the patient's current medications.  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 Continue statin therapy H/O hypokalemia- resolved. Dysphagia.  PEG scheduled for Monday.  Length of stay, days: 11  Rogelia BogaKWIATKOWSKI,PETER FRANK , MD 11/14/2016, 11:13 AM

## 2016-11-14 NOTE — Progress Notes (Signed)
Physical Therapy Session Note  Patient Details  Name: [name] MRN: [id number] Date of Birth: 01/25/1940  Today's Date: 11/14/2016 PT Individual Time: 1002-1100 PT Individual Time Calculation (min): 58 min   Short Term Goals: Week 1:  PT Short Term Goal 1 (Week 1): STG=LTG  Skilled Therapeutic Interventions/Progress Updates:   Pt received sitting in WC and agreeable to PT  Gait training with Rollator 24400ft x4 with supervision assist. Additional gait training withno AD and min assist overall x 12600ft. Gait training up/diwn ramp with rollator to simulate access to haome.   NMR for BLE strengthening with emphasis on gluteal strengthening. Clam shells in side sidelying, Bridges, bridge in figure 4. Hip abduction,. All completed x 10 BLE with cues for trunk and pelvic stability.   Qped NMR. Forward reaching, donkey kicks, hip abduciton. All completed x 10 BLE with min assist from PT and min cues for stabilization of pelvis.   Dynamic standing balance on Biodex. LOS x 4 and Random control x 3. Pt continues to required min assist and moderate cues for use of ankle strategy to shift weight to end of LOS.   [facility]            Therapy Documentation Precautions:  Precautions Precautions: Fall Restrictions Weight Bearing Restrictions: No  Pain: Pain Assessment Pain Assessment: No/denies pain   See Function Navigator for Current Functional Status.   Therapy/Group: Individual Therapy  Golden Popustin E Darely Becknell 11/14/2016, 11:05 AM

## 2016-11-15 ENCOUNTER — Inpatient Hospital Stay (HOSPITAL_COMMUNITY): Payer: Medicare Other

## 2016-11-15 LAB — GLUCOSE, CAPILLARY
GLUCOSE-CAPILLARY: 113 mg/dL — AB (ref 65–99)
GLUCOSE-CAPILLARY: 115 mg/dL — AB (ref 65–99)
GLUCOSE-CAPILLARY: 133 mg/dL — AB (ref 65–99)
GLUCOSE-CAPILLARY: 148 mg/dL — AB (ref 65–99)
GLUCOSE-CAPILLARY: 158 mg/dL — AB (ref 65–99)
GLUCOSE-CAPILLARY: 83 mg/dL (ref 65–99)

## 2016-11-15 NOTE — Progress Notes (Signed)
Occupational Therapy Session Note  Patient Details  Name: [name] MRN: [id number] Date of Birth: 17-Feb-1940  Today's Date: 11/15/2016 OT Individual Time: 2479-9800 OT Individual Time Calculation (min): 54 min    Short Term Goals: Week 2:  OT Short Term Goal 1 (Week 2): Cont to work towards LTG of Mod I overall  Skilled Therapeutic Interventions/Progress Updates:    1:1. No pain reported. Friend/hairdresser present for session. Focus of session on ambulation with Rolator, safety awareness, functional endurance, ambulation over uneven surface/ramp, and UB therapeutic exercise. Pt ambulates with rolator to/from outside courtyard with increased time and supervision. Pt able to navigate up/down ramp and over uneven surfaces while outside with supervision. Pt completes 1x15 of UB theres in all planes of motion with 2# dumbells with instructional cues for technique and breathing to improve BUE strength required for ADLs. Exited session with pt seated in w/c with call light in reach and all needs met and friend present in room.  Therapy Documentation Precautions:  Precautions Precautions: Fall Restrictions Weight Bearing Restrictions: No  See Function Navigator for Current Functional Status.   Therapy/Group: individual  [name] 11/15/2016, 2:54 PM

## 2016-11-15 NOTE — Progress Notes (Signed)
Patient ID: [name], female   DOB: 03/03/1940, 76 y.o.   MRN: [id number]   11/15/16.  Donna Nationsnna K Rocchi is a 76 y.o. female  Admit for CIR with functional deficits secondary to a L>R cerebellar and L medullary infarcts  Subjective: No new complaints. No new problems. Annoyed with NGT.  Objective: Vital signs in last 24 hours: Temp:  [98.2 F (36.8 C)-98.3 F (36.8 C)] 98.2 F (36.8 C) (11/04 0408) Pulse Rate:  [96-103] 96 (11/04 0408) Resp:  [18] 18 (11/04 0408) BP: (129-134)/(61-67) 134/61 (11/04 0408) SpO2:  [95 %-97 %] 95 % (11/04 0408) Weight:  [126 lb 5.2 oz (57.3 kg)] 126 lb 5.2 oz (57.3 kg) (11/04 0408) Weight change: -9.8 oz (-3 kg) Last BM Date: 11/14/16  Intake/Output from previous day: 11/03 0701 - 11/04 0700 In: 920 [NG/GT:920] Out: 1375 [Urine:1375] Last cbgs: CBG (last 3)  Recent Labs    11/14/16 2343 11/15/16 0356 11/15/16 0852  GLUCAP 122* 158* 148*   Vitals:   11/13/16 1429 11/14/16 0454 11/14/16 1438 11/15/16 0408  BP: 136/71 125/69 129/67 134/61  Pulse: 100 98 (!) 103 96  Resp: 18 18 18 18   Temp: 98.3 F (36.8 C) 97.7 F (36.5 C) 98.3 F (36.8 C) 98.2 F (36.8 C)  TempSrc: Oral Oral Oral Oral  SpO2: 97% 98% 97% 95%  Weight:  132 lb 15 oz (60.3 kg)  126 lb 5.2 oz (57.3 kg)    Physical Exam General: No apparent distress   HEENT: NGT in palce Lungs: Normal effort. Lungs clear to auscultation, no crackles or wheezes. Cardiovascular: Regular rate and rhythm, no edema Abdomen: S/NT/ND; BS(+) Musculoskeletal:  unchanged Neurological: No new neurological deficits Extremities- no edema  Skin: clear   Mental state: Alert, oriented, cooperative    Lab Results: BMET    Component Value Date/Time   NA 145 11/09/2016 1113   K 4.2 11/09/2016 1113   CL 106 11/09/2016 1113   CO2 29 11/09/2016 1113   GLUCOSE 121 (H) 11/09/2016 1113   BUN 44 (H) 11/09/2016 1113   CREATININE 0.80 11/09/2016 1113   CALCIUM 9.7 11/09/2016 1113   GFRNONAA >60  11/09/2016 1113   GFRAA >60 11/09/2016 1113   CBC    Component Value Date/Time   WBC 9.7 11/12/2016 0844   RBC 3.58 (L) 11/12/2016 0844   HGB 10.7 (L) 11/12/2016 0844   HCT 34.4 (L) 11/12/2016 0844   PLT 411 (H) 11/12/2016 0844   MCV 96.1 11/12/2016 0844   MCH 29.9 11/12/2016 0844   MCHC 31.1 11/12/2016 0844   RDW 15.6 (H) 11/12/2016 0844   LYMPHSABS 1.6 11/04/2016 0621   MONOABS 1.3 (H) 11/04/2016 0621   EOSABS 0.1 11/04/2016 0621   BASOSABS 0.0 11/04/2016 0621     Medications: I have reviewed the patient's current medications.  Assessment/Plan:  Dizziness with gait deficitssecondary to cerebellar and left medullary infarcts.Recommendations of 30 day event monitor as outpatient -CIR PT, OT, SLP  Dyslipidemia. Continue statin therapy H/O hypokalemia- resolved. Dysphagia.  PEG scheduled for tomarrow      Length of stay, days: 12  Rogelia BogaKWIATKOWSKI,PETER FRANK , MD 11/15/2016, 9:28 AM

## 2016-11-16 ENCOUNTER — Inpatient Hospital Stay (HOSPITAL_COMMUNITY): Payer: Medicare Other | Admitting: Physical Therapy

## 2016-11-16 ENCOUNTER — Inpatient Hospital Stay (HOSPITAL_COMMUNITY): Payer: Medicare Other | Admitting: Speech Pathology

## 2016-11-16 ENCOUNTER — Inpatient Hospital Stay (HOSPITAL_COMMUNITY): Payer: Medicare Other | Admitting: Occupational Therapy

## 2016-11-16 LAB — GLUCOSE, CAPILLARY
GLUCOSE-CAPILLARY: 72 mg/dL (ref 65–99)
GLUCOSE-CAPILLARY: 112 mg/dL — AB (ref 65–99)
Glucose-Capillary: 104 mg/dL — ABNORMAL HIGH (ref 65–99)
Glucose-Capillary: 138 mg/dL — ABNORMAL HIGH (ref 65–99)
Glucose-Capillary: 98 mg/dL (ref 65–99)

## 2016-11-16 LAB — SURGICAL PCR SCREEN
MRSA, PCR: NEGATIVE
Staphylococcus aureus: POSITIVE — AB

## 2016-11-16 MED ORDER — CHLORHEXIDINE GLUCONATE CLOTH 2 % EX PADS
6.0000 | MEDICATED_PAD | Freq: Every day | CUTANEOUS | Status: DC
Start: 2016-11-16 — End: 2016-11-19
  Administered 2016-11-16 – 2016-11-18 (×2): 6 via TOPICAL

## 2016-11-16 MED ORDER — MUPIROCIN 2 % EX OINT
1.0000 "application " | TOPICAL_OINTMENT | Freq: Two times a day (BID) | CUTANEOUS | Status: DC
  Administered 2016-11-16 – 2016-11-19 (×6): 1 via NASAL
  Filled 2016-11-16: qty 22

## 2016-11-16 NOTE — Progress Notes (Signed)
Pt NPO, consent in shadow chart for Gtube placement. CHG baths completed.

## 2016-11-16 NOTE — Progress Notes (Signed)
Physical Therapy Session Note  Patient Details  Name: LECHELLE WRIGLEY MRN: [id number] Date of Birth: Apr 10, 1940  Today's Date: 11/16/2016 PT Individual Time: [id number] PT Individual Time Calculation (min): 45 min and 45 min  Short Term Goals: Week 1:   STG=LTG  Skilled Therapeutic Interventions/Progress Updates: Tx1: Pt presented in recliner agreeable to therapy. Pt ambulated to rehab gym with rollator with supervision. Participated in standing balance activities on compliant surface including fine motor activities at table. Pt initially required minA to maintain balance but improved to min guard. Pt instructed in [place] A exercises and provided HEP. Pt returned to room in same manner as prior and returned to recliner with call bell within reach and needs met.  Tx2: Pt presented in recliner with son and DIL present agreeable to therapy. Pt ambulated to day room via rollator and performed wt shifting anterior/posterior and LOB on Biodex. Pt required min tactile cues for increasing anterior wt shift without use of shoulders. Pt performed additional balance activities including side stepping at rail, backwards walking, heel walk, and heel raises. Pt returned to room and discussed with family role of 24/7 supervision and family discussing providing family ed. Family advised will be present tomorrow morning for any family ed. Pt and family happy with set up and left with all needs met.      Therapy Documentation Precautions:  Precautions Precautions: Fall Restrictions Weight Bearing Restrictions: No   See Function Navigator for Current Functional Status.   Therapy/Group: Individual Therapy  Jamesia Linnen  Timithy Arons, PTA  11/16/2016, 1:05 PM

## 2016-11-16 NOTE — Progress Notes (Signed)
Occupational Therapy Session Note  Patient Details  Name: [name] MRN: [id number] Date of Birth: 04/11/1940  Today's Date: 11/16/2016 OT Individual Time: [id number] OT Individual Time Calculation (min): 58 min   Short Term Goals: Week 2:  OT Short Term Goal 1 (Week 2): Cont to work towards LTG of Mod I overall  Skilled Therapeutic Interventions/Progress Updates:    Tx focus on ADL retraining, functional ambulation with device, safety awareness, and balance.   Pt greeted in recliner, wanting to shower but unsure if she was allowed due to impending surgery this afternoon. Consulted RN and she consented to shower. Pt ambulating around room with rollator to gather necessary items with supervision. She transferred to toilet to void bladder and doffed clothing items. Pt then transferred to TTB. IV covered. Pt bathing with setup at sit<stand level. Pt drying herself in standing afterwards with 1 small posterior LOB, which she was able to correct without physical assist. Pt dressing in recliner, using rollator for sit<stands. Grooming tasks/oral care completed standing and sitting at sink as needed (with use of rollator). Pt able to pick up soiled linen from bathrooms floor, stooping to floor as needed with unilateral support on grab bar. Throughout session, she required mod cues to lock her rollator brakes. At end of tx pt was left in recliner with all needs within reach, anticipating next therapist.   Therapy Documentation Precautions:  Precautions Precautions: Fall Restrictions Weight Bearing Restrictions: No Pain: No c/o pain during tx    ADL: ADL ADL Comments: see functional navigator :    See Function Navigator for Current Functional Status.   Therapy/Group: Individual Therapy  Edrie Ehrich A Prescilla Monger 11/16/2016, 12:49 PM

## 2016-11-16 NOTE — Progress Notes (Signed)
Called IR, long case and unsure if will get to pt today.  Will call within the hour to let me know. Notified PA and SW.

## 2016-11-16 NOTE — Progress Notes (Signed)
Subjective/Complaints:  No issues overnite, looking forward to PEG, discussed process including OP HH follow up   ROS: pt denies nausea, vomiting, diarrhea, cough, shortness of breath or chest pain   Objective: Vital Signs: Blood pressure 131/67, pulse 98, temperature 98 F (36.7 C), temperature source Oral, resp. rate 18, weight 57.1 kg (125 lb 14.1 oz), SpO2 96 %. No results found. Results for orders placed or performed during the hospital encounter of 11/03/16 (from the past 72 hour(s))  Glucose, capillary     Status: Abnormal   Collection Time: 11/13/16 11:51 AM  Result Value Ref Range   Glucose-Capillary 112 (H) 65 - 99 mg/dL  Glucose, capillary     Status: Abnormal   Collection Time: 11/13/16  4:30 PM  Result Value Ref Range   Glucose-Capillary 106 (H) 65 - 99 mg/dL  Glucose, capillary     Status: None   Collection Time: 11/13/16  8:07 PM  Result Value Ref Range   Glucose-Capillary 82 65 - 99 mg/dL  Glucose, capillary     Status: Abnormal   Collection Time: 11/13/16 11:34 PM  Result Value Ref Range   Glucose-Capillary 129 (H) 65 - 99 mg/dL  Glucose, capillary     Status: Abnormal   Collection Time: 11/14/16  4:19 AM  Result Value Ref Range   Glucose-Capillary 154 (H) 65 - 99 mg/dL  Glucose, capillary     Status: Abnormal   Collection Time: 11/14/16  8:30 AM  Result Value Ref Range   Glucose-Capillary 129 (H) 65 - 99 mg/dL   Comment 1 Document in Chart   Glucose, capillary     Status: None   Collection Time: 11/14/16 12:01 PM  Result Value Ref Range   Glucose-Capillary 97 65 - 99 mg/dL  Glucose, capillary     Status: Abnormal   Collection Time: 11/14/16  4:49 PM  Result Value Ref Range   Glucose-Capillary 112 (H) 65 - 99 mg/dL  Glucose, capillary     Status: Abnormal   Collection Time: 11/14/16  8:28 PM  Result Value Ref Range   Glucose-Capillary 114 (H) 65 - 99 mg/dL  Glucose, capillary     Status: Abnormal   Collection Time: 11/14/16 11:43 PM  Result Value  Ref Range   Glucose-Capillary 122 (H) 65 - 99 mg/dL  Glucose, capillary     Status: Abnormal   Collection Time: 11/15/16  3:56 AM  Result Value Ref Range   Glucose-Capillary 158 (H) 65 - 99 mg/dL  Glucose, capillary     Status: Abnormal   Collection Time: 11/15/16  8:52 AM  Result Value Ref Range   Glucose-Capillary 148 (H) 65 - 99 mg/dL   Comment 1 Notify RN   Glucose, capillary     Status: Abnormal   Collection Time: 11/15/16 12:15 PM  Result Value Ref Range   Glucose-Capillary 113 (H) 65 - 99 mg/dL  Glucose, capillary     Status: None   Collection Time: 11/15/16  4:31 PM  Result Value Ref Range   Glucose-Capillary 83 65 - 99 mg/dL  Surgical pcr screen     Status: Abnormal   Collection Time: 11/15/16  6:32 PM  Result Value Ref Range   MRSA, PCR NEGATIVE NEGATIVE   Staphylococcus aureus POSITIVE (A) NEGATIVE    Comment: (NOTE) The Xpert SA Assay (FDA approved for NASAL specimens in patients 74 years of age and older), is one component of a comprehensive surveillance program. It is not intended to diagnose infection nor to guide  or monitor treatment. Performed at [facility], [address] [place], [place] [address]   Glucose, capillary     Status: Abnormal   Collection Time: 11/15/16  7:58 PM  Result Value Ref Range   Glucose-Capillary 115 (H) 65 - 99 mg/dL   Comment 1 Notify RN   Glucose, capillary     Status: Abnormal   Collection Time: 11/15/16 11:48 PM  Result Value Ref Range   Glucose-Capillary 133 (H) 65 - 99 mg/dL  Glucose, capillary     Status: Abnormal   Collection Time: 11/16/16  4:16 AM  Result Value Ref Range   Glucose-Capillary 104 (H) 65 - 99 mg/dL   Comment 1 Notify RN      HEENT: normal Cardio:.RRR without murmur. No JVD  Resp: CTA Bilaterally without wheezes or rales. Normal effort   GI: BS positive and Nontender and nondistended Extremity:  No Edema Skin:   Intact Neuro: Alert/Oriented and Other Motor strength is 5/5  bilateral deltoid bicep tricep grip hip flexor and extensor ankle dorsiflexor, no dysmetria on finger-nose-finger testing. No nystagmus Musc/Skel:  Other No pain with upper limb or lower limb range of motion General no acute distress   Assessment/Plan: 1. 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 Physiatrist is providing close team supervision and 24 hour management of active medical problems listed below. Physiatrist and rehab team continue to assess barriers to discharge/monitor patient progress toward functional and medical goals. FIM: Function - Bathing Position: Shower Body parts bathed by patient: Right arm, Left arm, Chest, Abdomen, Front perineal area, Buttocks, Right upper leg, Left upper leg, Left lower leg, Right lower leg, Back Body parts bathed by helper: Back Assist Level: Set up Set up : To obtain items  Function- Upper Body Dressing/Undressing What is the patient wearing?: Pull over shirt/dress, Bra Bra - Perfomed by patient: Thread/unthread right bra strap, Thread/unthread left bra strap, Hook/unhook bra (pull down sports bra) Pull over shirt/dress - Perfomed by patient: Thread/unthread right sleeve, Thread/unthread left sleeve, Put head through opening, Pull shirt over trunk Assist Level: More than reasonable time Function - Lower Body Dressing/Undressing What is the patient wearing?: Underwear, Shoes, Pants Position: Other (comment)(Recliner) Underwear - Performed by patient: Thread/unthread right underwear leg, Thread/unthread left underwear leg, Pull underwear up/down Pants- Performed by patient: Thread/unthread right pants leg, Thread/unthread left pants leg, Pull pants up/down, Fasten/unfasten pants Non-skid slipper socks- Performed by patient: Don/doff right sock, Don/doff left sock Shoes - Performed  by patient: Don/doff right shoe, Don/doff left shoe Assist for footwear: Setup Assist for lower body dressing: Supervision or verbal cues  Function - Toileting Toileting steps completed by patient: Adjust clothing prior to toileting, Performs perineal hygiene, Adjust clothing after toileting Toileting Assistive Devices: Grab bar or rail Assist level: Supervision or verbal cues  Function - Archivist transfer assistive device: Hospital doctor level to toilet: Supervision or verbal cues Assist level from toilet: Supervision or verbal cues  Function - Chair/bed transfer Chair/bed transfer method: Stand pivot Chair/bed transfer assist level: Supervision or verbal cues Chair/bed transfer assistive device: Armrests Chair/bed transfer details: Tactile cues for posture, Verbal cues for precautions/safety  Function - Locomotion: Wheelchair Will patient use wheelchair at discharge?: No Wheelchair activity did not occur: N/A Wheel 50 feet with 2 turns activity did not occur: N/A Wheel 150 feet activity did not occur: N/A Function -  Locomotion: Ambulation Assistive device: No device Max distance: 177ft  Assist level: Touching or steadying assistance (Pt > 75%) Assist level: Touching or steadying assistance (Pt > 75%) Assist level: Touching or steadying assistance (Pt > 75%) Walk 150 feet activity did not occur: Safety/medical concerns Assist level: Moderate assist (Pt 50 - 74%) Assist level: Touching or steadying assistance (Pt > 75%)  Function - Comprehension Comprehension: Auditory Comprehension assist level: Follows complex conversation/direction with extra time/assistive device  Function - Expression Expression: Verbal Expression assist level: Expresses complex ideas: With extra time/assistive device  Function - Social Interaction Social Interaction assist level: Interacts appropriately with others with medication or extra time (anti-anxiety,  antidepressant).  Function - Problem Solving Problem solving assist level: Solves complex problems: With extra time  Function - Memory Memory assist level: More than reasonable amount of time Patient normally able to recall (first 3 days only): Current season, Staff names and faces, That he or she is in a hospital, Location of own room  Medical Problem List and Plan: 1.  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 Recommendations of 30 day event monitor as outpatient             -CIR PT, OT, SLP- discussed with speech,FEES will not change treatment therefore will hold off   2.  DVT Prophylaxis/Anticoagulation: Subcutaneous heparin.--dc, encourage ambulation, on ASA and Plavix 3. Pain Management: Tylenol as needed 4. Mood: Zoloft 50 mg daily 5. Neuropsych: This patient is capable of making decisions on her own behalf. 6. Skin/Wound Care: Routine skin checks 7. Fluids/Electrolytes/Nutrition: tolerating TF 8. Dysphagia. Patient with tube feeds   -  Repeat MBS 10/29- no improvement  -PEG scheduled for 11/5                        9. Hypertension. Permissive hypertension. Monitor of increased mobility. Patient on Hyzaar 100-12 0.5 mg daily prior to admission. bp's controlled 11/5 Vitals:   11/15/16 1533 11/16/16 0437  BP: 105/78 131/67  Pulse: (!) 109 98  Resp: 18 18  Temp: (!) 97.5 F (36.4 C) 98 F (36.7 C)  SpO2: 97% 96%   10. Hyperlipidemia. Lipitor 11. Hypokalemia. 3.4 on 10/24    LOS (Days) 13 A FACE TO FACE EVALUATION WAS PERFORMED  KIRSTEINS,ANDREW E 11/16/2016, 8:30 AM

## 2016-11-16 NOTE — Progress Notes (Signed)
Speech Language Pathology Daily Session Note  Patient Details  Name: Nolon Nationsnna K Kehres MRN: [id number] Date of Birth: 09/06/1940  Today's Date: 11/16/2016 SLP Individual Time: 1100-1200 SLP Individual Time Calculation (min): 60 min  Short Term Goals: Week 2: SLP Short Term Goal 1 (Week 2): Pt will demonstrate readiness for repeat MBS as evidenced by minimal overt s/s of aspiration with PO trials and mod I use of swallowing precautions.   SLP Short Term Goal 2 (Week 2): Pt will complete 100 repetitions of pharyngeal strengthening exercises with mod I each session.   SLP Short Term Goal 3 (Week 2): Pt will complete 25 repetitions of EMST with mod I use of device and a self perceived effort level of <7/10.    Skilled Therapeutic Interventions:Skilled treatment session focused on pharyngeal strengthening exercises and continued education on swallow dysfunction, poor prognosis (i.e., slow recovery), FEEs not indicated at this time as it will not offer any new information. Continue NPO status with outpatient ST. Pt able to return demonstrate EMST for 3 sets of 30 reps with self perceived effort of 7 out of 10. Pt left upright in recliner with all needs within reach. Continue per current plan of care.      Function:    Cognition Comprehension Comprehension assist level: Follows complex conversation/direction with extra time/assistive device  Expression   Expression assist level: Expresses complex ideas: With extra time/assistive device  Social Interaction Social Interaction assist level: Interacts appropriately with others with medication or extra time (anti-anxiety, antidepressant).  Problem Solving Problem solving assist level: Solves complex problems: With extra time  Memory Memory assist level: More than reasonable amount of time    Pain    Therapy/Group: Individual Therapy  Novalee Horsfall 11/16/2016, 12:01 PM

## 2016-11-16 NOTE — Progress Notes (Signed)
Social Work Patient ID: [name], female   DOB: 1940/11/14, 76 y.o.   MRN: [id number]  Met with pt, son and daughter in-law to discuss discharge plans. Had questions regarding tomorrow and when discharged and what will she need. Will order tube feedings when ordered by nutritionist and after procedure. Discussed [place] suction and hospital bed for a short time. Son to be here tomorrow to go through therapies with pt.

## 2016-11-17 ENCOUNTER — Inpatient Hospital Stay (HOSPITAL_COMMUNITY): Payer: Medicare Other

## 2016-11-17 ENCOUNTER — Encounter (HOSPITAL_COMMUNITY): Payer: Medicare Other | Admitting: Occupational Therapy

## 2016-11-17 ENCOUNTER — Ambulatory Visit (HOSPITAL_COMMUNITY): Payer: Medicare Other | Admitting: Physical Therapy

## 2016-11-17 ENCOUNTER — Encounter (HOSPITAL_COMMUNITY): Payer: Self-pay | Admitting: Interventional Radiology

## 2016-11-17 HISTORY — PX: IR GASTROSTOMY TUBE MOD SED: IMG625

## 2016-11-17 LAB — GLUCOSE, CAPILLARY
GLUCOSE-CAPILLARY: 77 mg/dL (ref 65–99)
GLUCOSE-CAPILLARY: 92 mg/dL (ref 65–99)
Glucose-Capillary: 100 mg/dL — ABNORMAL HIGH (ref 65–99)
Glucose-Capillary: 80 mg/dL (ref 65–99)
Glucose-Capillary: 92 mg/dL (ref 65–99)

## 2016-11-17 MED ORDER — HYDROCODONE-ACETAMINOPHEN 5-325 MG PO TABS
1.0000 | ORAL_TABLET | ORAL | Status: DC | PRN
Start: 2016-11-17 — End: 2016-11-19

## 2016-11-17 MED ORDER — LIDOCAINE HCL 1 % IJ SOLN
INTRAMUSCULAR | Status: AC
  Filled 2016-11-17: qty 20

## 2016-11-17 MED ORDER — MIDAZOLAM HCL 2 MG/2ML IJ SOLN
INTRAMUSCULAR | Status: AC
  Filled 2016-11-17: qty 2

## 2016-11-17 MED ORDER — ONDANSETRON HCL 4 MG/2ML IJ SOLN: 4.0000 mg | INTRAMUSCULAR | Status: DC | PRN

## 2016-11-17 MED ORDER — HYDROMORPHONE HCL 1 MG/ML IJ SOLN: 1.0000 mg | INTRAMUSCULAR | Status: DC | PRN

## 2016-11-17 MED ORDER — GLUCAGON HCL RDNA (DIAGNOSTIC) 1 MG IJ SOLR
INTRAMUSCULAR | Status: AC | PRN
Start: 2016-11-17 — End: 2016-11-17
  Administered 2016-11-17: .5 mg via INTRAVENOUS

## 2016-11-17 MED ORDER — ACETAMINOPHEN 160 MG/5ML PO SOLN
650.0000 mg | ORAL | Status: DC | PRN
  Administered 2016-11-17 – 2016-11-18 (×4): 650 mg
  Filled 2016-11-17 (×3): qty 20.3

## 2016-11-17 MED ORDER — IOPAMIDOL (ISOVUE-300) INJECTION 61%
INTRAVENOUS | Status: AC
  Administered 2016-11-17: 10 mL
  Filled 2016-11-17: qty 50

## 2016-11-17 MED ORDER — FENTANYL CITRATE (PF) 100 MCG/2ML IJ SOLN
INTRAMUSCULAR | Status: AC
  Filled 2016-11-17: qty 2

## 2016-11-17 MED ORDER — MIDAZOLAM HCL 2 MG/2ML IJ SOLN
INTRAMUSCULAR | Status: AC | PRN
  Administered 2016-11-17: 0.5 mg via INTRAVENOUS
  Administered 2016-11-17: 1 mg via INTRAVENOUS
  Administered 2016-11-17: 0.5 mg via INTRAVENOUS

## 2016-11-17 MED ORDER — LIDOCAINE HCL 1 % IJ SOLN
INTRAMUSCULAR | Status: AC | PRN
Start: 2016-11-17 — End: 2016-11-17
  Administered 2016-11-17: 10 mL

## 2016-11-17 MED ORDER — CEFAZOLIN SODIUM-DEXTROSE 2-4 GM/100ML-% IV SOLN
INTRAVENOUS | Status: AC
  Administered 2016-11-17: 2000 mg
  Filled 2016-11-17: qty 100

## 2016-11-17 MED ORDER — SODIUM CHLORIDE 0.9 % IV SOLN
INTRAVENOUS | Status: AC | PRN
  Administered 2016-11-17: 10 mL/h via INTRAVENOUS

## 2016-11-17 MED ORDER — GLUCAGON HCL RDNA (DIAGNOSTIC) 1 MG IJ SOLR
INTRAMUSCULAR | Status: AC
  Filled 2016-11-17: qty 1

## 2016-11-17 MED ORDER — FENTANYL CITRATE (PF) 100 MCG/2ML IJ SOLN
INTRAMUSCULAR | Status: AC | PRN
  Administered 2016-11-17 (×2): 25 ug via INTRAVENOUS
  Administered 2016-11-17: 50 ug via INTRAVENOUS

## 2016-11-17 NOTE — Progress Notes (Signed)
Occupational Therapy Note  Patient Details  Name: Nolon Nationsnna K Minervini MRN: [id number] Date of Birth: 07/31/1940  Today's Date: 11/17/2016 OT Individual Time: [id number] OT Individual Time Calculation (min): 14 min  OT missed time:  61 mins  Pt resting in bed upon OT arrival and reported she had just returned from peg tube procedure.  Pt stated she was not in pain but was very fatigued.  Coordinated with RN and pt rescheduling of family training for tomorrow and repositioning pt in bed per orders (HOB 30*).  Deitra Mayolexandra  Adalena Abdulla 11/17/2016, 12:35 PM

## 2016-11-17 NOTE — Progress Notes (Signed)
Physical Therapy Weekly Progress Note  Patient Details  Name: [name] MRN: [id number] Date of Birth: [birth year]  Beginning of progress report period: November 04, 2016 End of progress report period: November 17, 2016  Today's Date: 11/17/2016  Patient has met 1 of 1 short term goals.  Pt has made progress during her current course of therapy. She is currently at supervision level with rollator as she continues to demonstrate deficits in balance as noted decreased in ankle and hip strategy for balance recovery without AD. Pt was anticipated for d/c Wed 11/7 however PEG placement was delayed to 11/6 thus extending her d/c date.   Patient continues to demonstrate the following deficits muscle weakness and impaired timing and sequencing, unbalanced muscle activation and decreased coordination and therefore will continue to benefit from skilled PT intervention to increase functional independence with mobility.  Patient progressing toward long term goals..  Continue plan of care.  PT Short Term Goals Week 1:  PT Short Term Goal 1 (Week 1): STG=LTG  Week 2: STG =LTG du tt d/c date.   Skilled Therapeutic Interventions/Progress Updates:      Therapy Documentation Precautions:  Precautions Precautions: Fall Restrictions Weight Bearing Restrictions: No General: PT Amount of Missed Time (min): 30 Minutes PT Missed Treatment Reason: Unavailable (Comment)(PEG placement) Vital Signs: Therapy Vitals Pulse Rate: (!) 105 Resp: 12 BP: 140/71 Patient Position (if appropriate): Lying Oxygen Therapy SpO2: 95 % O2 Device: Nasal Cannula O2 Flow Rate (L/min): 2 L/min Pulse Oximetry Type: Continuous End Tidal CO2 (EtCO2): 33 Pain: Pain Assessment Pain Assessment: No/denies pain Faces Pain Scale: Hurts little more   See Function Navigator for Current Functional Status.  Therapy/Group: Individual Therapy  Rosita DeChalus 11/17/2016, 9:21 AM

## 2016-11-17 NOTE — Progress Notes (Signed)
Subjective/Complaints:  Patient is awaiting tube placement.  Was to be done yesterday.  Tube feeds were run between 5 and midnight yesterday.  Has had bowel movement and is still urinating.  Remains n.p.o.   ROS: pt denies nausea, vomiting, diarrhea, cough, shortness of breath or chest pain   Objective: Vital Signs: Blood pressure 124/71, pulse (!) 102, temperature 97.9 F (36.6 C), temperature source Oral, resp. rate 20, weight 60.1 kg (132 lb 9.6 oz), SpO2 98 %. No results found. Results for orders placed or performed during the hospital encounter of 11/03/16 (from the past 72 hour(s))  Glucose, capillary     Status: Abnormal   Collection Time: 11/14/16  8:30 AM  Result Value Ref Range   Glucose-Capillary 129 (H) 65 - 99 mg/dL   Comment 1 Document in Chart   Glucose, capillary     Status: None   Collection Time: 11/14/16 12:01 PM  Result Value Ref Range   Glucose-Capillary 97 65 - 99 mg/dL  Glucose, capillary     Status: Abnormal   Collection Time: 11/14/16  4:49 PM  Result Value Ref Range   Glucose-Capillary 112 (H) 65 - 99 mg/dL  Glucose, capillary     Status: Abnormal   Collection Time: 11/14/16  8:28 PM  Result Value Ref Range   Glucose-Capillary 114 (H) 65 - 99 mg/dL  Glucose, capillary     Status: Abnormal   Collection Time: 11/14/16 11:43 PM  Result Value Ref Range   Glucose-Capillary 122 (H) 65 - 99 mg/dL  Glucose, capillary     Status: Abnormal   Collection Time: 11/15/16  3:56 AM  Result Value Ref Range   Glucose-Capillary 158 (H) 65 - 99 mg/dL  Glucose, capillary     Status: Abnormal   Collection Time: 11/15/16  8:52 AM  Result Value Ref Range   Glucose-Capillary 148 (H) 65 - 99 mg/dL   Comment 1 Notify RN   Glucose, capillary     Status: Abnormal   Collection Time: 11/15/16 12:15 PM  Result Value Ref Range   Glucose-Capillary 113 (H) 65 - 99 mg/dL  Glucose, capillary     Status: None   Collection Time: 11/15/16  4:31 PM  Result Value Ref Range    Glucose-Capillary 83 65 - 99 mg/dL  Surgical pcr screen     Status: Abnormal   Collection Time: 11/15/16  6:32 PM  Result Value Ref Range   MRSA, PCR NEGATIVE NEGATIVE   Staphylococcus aureus POSITIVE (A) NEGATIVE    Comment: (NOTE) The Xpert SA Assay (FDA approved for NASAL specimens in patients 74 years of age and older), is one component of a comprehensive surveillance program. It is not intended to diagnose infection nor to guide or monitor treatment. Performed at [facility], [address] [place], [place] [address]   Glucose, capillary     Status: Abnormal   Collection Time: 11/15/16  7:58 PM  Result Value Ref Range   Glucose-Capillary 115 (H) 65 - 99 mg/dL   Comment 1 Notify RN   Glucose, capillary     Status: Abnormal   Collection Time: 11/15/16 11:48 PM  Result Value Ref Range   Glucose-Capillary 133 (H) 65 - 99 mg/dL  Glucose, capillary     Status: Abnormal   Collection Time: 11/16/16  4:16 AM  Result Value Ref Range   Glucose-Capillary 104 (H) 65 - 99 mg/dL   Comment 1 Notify RN   Glucose, capillary     Status: None  Collection Time: 11/16/16 11:46 AM  Result Value Ref Range   Glucose-Capillary 98 65 - 99 mg/dL  Glucose, capillary     Status: None   Collection Time: 11/16/16  4:53 PM  Result Value Ref Range   Glucose-Capillary 72 65 - 99 mg/dL  Glucose, capillary     Status: Abnormal   Collection Time: 11/16/16  8:28 PM  Result Value Ref Range   Glucose-Capillary 112 (H) 65 - 99 mg/dL  Glucose, capillary     Status: Abnormal   Collection Time: 11/16/16 11:58 PM  Result Value Ref Range   Glucose-Capillary 138 (H) 65 - 99 mg/dL  Glucose, capillary     Status: Abnormal   Collection Time: 11/17/16  4:09 AM  Result Value Ref Range   Glucose-Capillary 100 (H) 65 - 99 mg/dL     HEENT: normal Cardio:.RRR without murmur. No JVD  Resp: CTA Bilaterally without wheezes or rales. Normal effort   GI: BS positive and Nontender and  nondistended Extremity:  No Edema Skin:   Intact Neuro: Alert/Oriented and Other Motor strength is 5/5 bilateral deltoid bicep tricep grip hip flexor and extensor ankle dorsiflexor, no dysmetria on finger-nose-finger testing. No nystagmus Musc/Skel:  Other No pain with upper limb or lower limb range of motion General no acute distress   Assessment/Plan: 1. 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 Physiatrist is providing close team supervision and 24 hour management of active medical problems listed below. Physiatrist and rehab team continue to assess barriers to discharge/monitor patient progress toward functional and medical goals. FIM: Function - Bathing Position: Shower Body parts bathed by patient: Right arm, Left arm, Chest, Abdomen, Front perineal area, Buttocks, Right upper leg, Left upper leg, Left lower leg, Right lower leg, Back Body parts bathed by helper: Back Assist Level: Set up Set up : To obtain items  Function- Upper Body Dressing/Undressing What is the patient wearing?: Pull over shirt/dress, Bra Bra - Perfomed by patient: Thread/unthread right bra strap, Thread/unthread left bra strap, Hook/unhook bra (pull down sports bra) Pull over shirt/dress - Perfomed by patient: Thread/unthread right sleeve, Thread/unthread left sleeve, Put head through opening, Pull shirt over trunk Assist Level: More than reasonable time Function - Lower Body Dressing/Undressing What is the patient wearing?: Underwear, Shoes, Pants Position: Other (comment)(sitting in recliner) Underwear - Performed by patient: Thread/unthread right underwear leg, Thread/unthread left underwear leg, Pull underwear up/down Pants- Performed by patient: Thread/unthread right pants leg, Thread/unthread left pants leg, Pull pants up/down,  Fasten/unfasten pants Non-skid slipper socks- Performed by patient: Don/doff right sock, Don/doff left sock Shoes - Performed by patient: Don/doff right shoe, Don/doff left shoe Assist for footwear: Setup Assist for lower body dressing: Supervision or verbal cues  Function - Toileting Toileting steps completed by patient: Performs perineal hygiene, Adjust clothing after toileting, Adjust clothing prior to toileting Toileting Assistive Devices: Grab bar or rail Assist level: Supervision or verbal cues  Function - ArchivistToilet Transfers Toilet transfer assistive device: Grab bar, Walker Assist level to toilet: Supervision or verbal cues Assist level from toilet: Supervision or verbal cues  Function - Chair/bed transfer Chair/bed transfer method: Stand pivot Chair/bed transfer assist level: Supervision or verbal cues Chair/bed transfer assistive device: Armrests Chair/bed transfer details: Tactile cues for posture, Verbal cues for precautions/safety  Function - Locomotion: Wheelchair Will patient use wheelchair at discharge?: No Wheelchair activity did not occur:  N/A Wheel 50 feet with 2 turns activity did not occur: N/A Wheel 150 feet activity did not occur: N/A Function - Locomotion: Ambulation Assistive device: No device Max distance: 132ft  Assist level: Touching or steadying assistance (Pt > 75%) Assist level: Touching or steadying assistance (Pt > 75%) Assist level: Touching or steadying assistance (Pt > 75%) Walk 150 feet activity did not occur: Safety/medical concerns Assist level: Moderate assist (Pt 50 - 74%) Assist level: Touching or steadying assistance (Pt > 75%)  Function - Comprehension Comprehension: Auditory Comprehension assist level: Follows complex conversation/direction with extra time/assistive device  Function - Expression Expression: Verbal Expression assist level: Expresses complex ideas: With extra time/assistive device  Function - Social  Interaction Social Interaction assist level: Interacts appropriately with others with medication or extra time (anti-anxiety, antidepressant).  Function - Problem Solving Problem solving assist level: Solves complex problems: With extra time  Function - Memory Memory assist level: More than reasonable amount of time Patient normally able to recall (first 3 days only): Current season, Staff names and faces, That he or she is in a hospital, Location of own room  Medical Problem List and Plan: 1.  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 Recommendations of 30 day event monitor as outpatient             -CIR PT, OT, SLP-plan discharge after PEG feeds are established  2.  DVT Prophylaxis/Anticoagulation: Subcutaneous heparin.--dc, encourage ambulation, on ASA and Plavix, Plavix is on hold for the procedure 3. Pain Management: Tylenol as needed 4. Mood: Zoloft 50 mg daily 5. Neuropsych: This patient is capable of making decisions on her own behalf. 6. Skin/Wound Care: Routine skin checks 7. Fluids/Electrolytes/Nutrition: tolerating TF 8. Dysphagia. Patient with tube feeds   -  Repeat MBS 10/29- no improvement  -PEG scheduled for 11/6                        9. Hypertension. Permissive hypertension. Monitor of increased mobility. Patient on Hyzaar 100-12 0.5 mg daily prior to admission. bp's controlled 11/6 Vitals:   11/16/16 1300 11/17/16 0400  BP: 120/73 124/71  Pulse: (!) 107 (!) 102  Resp: 20 20  Temp: 98.4 F (36.9 C) 97.9 F (36.6 C)  SpO2: 100% 98%   10. Hyperlipidemia. Lipitor 11. Hypokalemia. 3.4 on 10/24    LOS (Days) 14 A FACE TO FACE EVALUATION WAS PERFORMED  Trenese Haft E 11/17/2016, 7:52 AM

## 2016-11-17 NOTE — Progress Notes (Signed)
Nutrition Follow-up  DOCUMENTATION CODES:   Not applicable  INTERVENTION:   -When appropriate, Initiate Jevity 1.5 boluses at 100 mL and advance every 6 hours by 100 mL to goal rate of 300 mL 4 times/day to provide 1800kcal (100% of needs), 77grams of protein, and 978m of H2O.   - Continue free water flushes of 200 ml q 8 hours. Total free water: 1512 ml/day.  NUTRITION DIAGNOSIS:   Inadequate oral intake related to inability to eat as evidenced by NPO status. Ongoing  GOAL:   Patient will meet greater than or equal to 90% of their needs Met  MONITOR:   TF tolerance, Weight trends, Labs, I & O's, Skin    ASSESSMENT:   76y.o. right handed female with history of hypertension. Presented 10/28/2016 after syncopal episode. 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 Per report, several small areas of acute early subacute infarction involving left medulla and scattered throughout the left greater than right mid to inferior cerebellar hemispheres as well as vermis. Currently NPO.   PEG placed today. Pt complains of abdominal soreness d/t procedure. She is glad NGT is removed and says she "feels much better". Per SLP note yesterday, pts swallow skills are getting stronger.   She has been tolerating her tube feeds via Cortrak NGT. Pts tube feeds were run between 5 pm and midnight yesterday (11/5). Tube feeds were stopped for PEG placement. Pt has gained 4 lbs since RD visit on 10/30.   Pt expressed that she did not want continuous feeds if possible. Spoke with PA who is open to starting either bolus or nocturnal feeds at RD and interns discretion. Per orders, PEG to not be used for tube feeds until cleared by IR in AM (11/7).   RD intern to continue to monitor. Labs and medications reviewed.  Diet Order:  Diet NPO time specified  Except for: Ice Chips  EDUCATION NEEDS:   No education needs identified at this time  Skin:  Skin Assessment: Reviewed RN Assessment  Last BM:  11/5  Height:   Ht Readings from Last 1 Encounters:  11/01/16 5' (1.524 m)    Weight:   Wt Readings from Last 1 Encounters:  11/17/16 132 lb 9.6 oz (60.1 kg)    Ideal Body Weight:  45.45 kg  BMI:  Body mass index is 25.9 kg/m.  Estimated Nutritional Needs:   Kcal:  1600-1800  Protein:  65-80 grams  Fluid:  1.6 - 1.8 L/day   G[place] PhiladelphiaDietetic Intern Pager: [phone]/06/2016 2:33 PM

## 2016-11-17 NOTE — Progress Notes (Signed)
Speech Language Pathology Daily Session Note  Patient Details  Name: [name] MRN: [id number] Date of Birth: 03/09/1940  Today's Date: 11/17/2016 SLP Individual Time: 4098-11911056-1156 SLP Individual Time Calculation (min): 60 min  Short Term Goals: Week 2: SLP Short Term Goal 1 (Week 2): Pt will demonstrate readiness for repeat MBS as evidenced by minimal overt s/s of aspiration with PO trials and mod I use of swallowing precautions.   SLP Short Term Goal 2 (Week 2): Pt will complete 100 repetitions of pharyngeal strengthening exercises with mod I each session.   SLP Short Term Goal 3 (Week 2): Pt will complete 25 repetitions of EMST with mod I use of device and a self perceived effort level of <7/10.    Skilled Therapeutic Interventions: Skilled ST services focused on swallow skills.Pt had PEG placed this morning and reported feeling much better without her nasal tube and her swallow felt stronger. Pt demonstrated clear vocal quality during conversation. SLP facilitated oral care via suction toothbrush with set up assistance pt demonstrated Mod I level. Pt consumed trials of ice chips demonstrated clear vocal quality with immediate throat clears to reduce pharyngeal residue for 4 trials and on the 5 trial demonstrated immediate cough and difficulty clearing airway with subsequental swallows. Pt preformed 110 CTAR,swallowing every 3-5 chin tucks due to lack of salvia via pt report at supervision- Mod I level.Pt demonstrated ability preform EMST for a total of 60 repetition in sets of 5 with an self perceived effort level of 6 at a Mod I level.Pt was left in room with call bell within reach. Recommend to continue plan of care.     Function:  Eating Eating   Modified Consistency Diet: No(Ice chips) Eating Assist Level: Supervision or verbal cues           Cognition Comprehension Comprehension assist level: Follows complex conversation/direction with extra time/assistive device  Expression    Expression assist level: Expresses complex ideas: With extra time/assistive device  Social Interaction Social Interaction assist level: Interacts appropriately with others with medication or extra time (anti-anxiety, antidepressant).  Problem Solving Problem solving assist level: Solves complex problems: With extra time  Memory Memory assist level: More than reasonable amount of time    Pain Pain Assessment Pain Assessment: 0-10 Pain Score: 4  Faces Pain Scale: Hurts little more Pain Type: Surgical pain Pain Location: Abdomen Pain Orientation: Left;Mid;Lower Pain Descriptors / Indicators: Sore Pain Frequency: Intermittent Pain Onset: Gradual Patients Stated Pain Goal: 2 Pain Intervention(s): Repositioned;Rest;Relaxation  Therapy/Group: Individual Therapy  Darianna Amy  [facility] 11/17/2016, 12:09 PM

## 2016-11-17 NOTE — Procedures (Signed)
  Procedure: Perc gastrostomy placement 9827f Preprocedure diagnosis: Dysphagia Postprocedure diagnosis: same EBL:   minimal Complications:  none immediate  See full dictation in [facility] PACS.  Thora Lance. Brenee Gajda MD Main # [phone] Pager  [phone]

## 2016-11-17 NOTE — Progress Notes (Signed)
Orthopedic Tech Progress Note Patient Details:  [name] 07/26/1940 [id number]  Ortho Devices Type of Ortho Device: Abdominal binder Ortho Device/Splint Location: Dropped off abdominal binder at bedside as requested.     Alvina ChouWilliams, Lyndsey Demos C 11/17/2016, 2:19 PM

## 2016-11-17 NOTE — Progress Notes (Signed)
Physical Therapy Note  Patient Details  Name: Nolon Nationsnna K Ergle MRN: [id number] Date of Birth: 09/22/1940 Today's Date: 11/17/2016    Pt off unit for PEG tube placement, pt missed 30 skilled therapy. WIll continue efforts as appropriate.    Shigeko Manard  Carmeron Heady, PTA  11/17/2016, 9:20 AM

## 2016-11-17 NOTE — Progress Notes (Signed)
Physical Therapy Session Note  Patient Details  Name: [name] MRN: [id number] Date of Birth: 10/15/1940  Today's Date: 11/17/2016 PT Missed Time: 60 Minutes Missed Time Reason: Other (Comment)(s/p PEG placement, RN reports hold therapies today)  Short Term Goals: Week 2:     Skilled Therapeutic Interventions/Progress Updates:    Patient s/p PEG tube placement and reports to hold therapies for this afternoon, RN confirmed.  Will complete family education next day.   Therapy Documentation Precautions:  Precautions Precautions: Fall Restrictions Weight Bearing Restrictions: No General: PT Amount of Missed Time (min): 60 Minutes PT Missed Treatment Reason: Other (Comment)(s/p PEG placement, RN reports hold therapies today)   See Function Navigator for Current Functional Status.   Therapy/Group: missed tx due to PEG placement  Elray McgregorCynthia Wynn 11/17/2016, 5:23 PM

## 2016-11-18 ENCOUNTER — Inpatient Hospital Stay (HOSPITAL_COMMUNITY): Payer: Medicare Other | Admitting: Occupational Therapy

## 2016-11-18 ENCOUNTER — Inpatient Hospital Stay (HOSPITAL_COMMUNITY): Payer: Medicare Other | Admitting: Physical Therapy

## 2016-11-18 ENCOUNTER — Inpatient Hospital Stay (HOSPITAL_COMMUNITY): Payer: Medicare Other

## 2016-11-18 LAB — GLUCOSE, CAPILLARY
GLUCOSE-CAPILLARY: 80 mg/dL (ref 65–99)
GLUCOSE-CAPILLARY: 119 mg/dL — AB (ref 65–99)
GLUCOSE-CAPILLARY: 72 mg/dL (ref 65–99)
Glucose-Capillary: 75 mg/dL (ref 65–99)
Glucose-Capillary: 99 mg/dL (ref 65–99)

## 2016-11-18 MED ORDER — CLOPIDOGREL BISULFATE 75 MG PO TABS
75.0000 mg | ORAL_TABLET | Freq: Every day | ORAL | Status: DC
  Administered 2016-11-18 – 2016-11-19 (×2): 75 mg
  Filled 2016-11-18 (×2): qty 1

## 2016-11-18 MED ORDER — JEVITY 1.5 CAL/FIBER PO LIQD
300.0000 mL | Freq: Four times a day (QID) | ORAL | Status: DC
  Administered 2016-11-18 – 2016-11-19 (×4): 300 mL
  Filled 2016-11-18 (×10): qty 1000

## 2016-11-18 NOTE — Patient Care Conference (Signed)
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 Cerebellar infarction (HCC) 11/03/2016  . Acute ischemic stroke (HCC)   . Benign essential HTN   . Dysarthria, post-stroke   . Leukocytosis   . Acute blood loss anemia   . 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 Acute encephalopathy 10/28/2016  . 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 INSECT BITE 07/04/2009  . HYPERLIPIDEMIA 06/03/2007  . ALLERGIC RHINITIS 06/03/2007  . VAGINITIS, ATROPHIC 06/03/2007  . CONTACT DERMATITIS&OTH ECZEMA DUE OTH SPEC AGENT 06/03/2007  . COLONIC POLYPS, HX OF 06/03/2007    Expected Discharge Date: Expected Discharge Date: 11/19/16  Team Members Present: Physician leading conference: Dr. Claudette LawsAndrew Kirsteins Social Worker Present: Dossie DerBecky Manan Olmo, LCSW Nurse Present: Willey BladeKaren Winter, RN PT Present: Grier RocherAustin Tucker, PT OT Present: Roney MansJennifer Smith, OT SLP Present: Colin BentonMadison Cratch, SLP PPS Coordinator present : Tora DuckMarie Noel, RN, CRRN     Current Status/Progress Goal Weekly Team Focus  Medical   s/p PEG, starting TF,   Home with TF boluses  resume plavix,    Bowel/Bladder   continent of bowel and bladder with supervision  Continent of bowel and bladder with supervision  Assist as needed   Swallow/Nutrition/ Hydration   NPO, ice chips, phyarngeal exercises   Mod I  pharyngeal  strengthening exercises, trials of thin/ice and family edu   ADL's   Supervision-Mod I overall at shower level   Mod I overall   pt/family education, d/c planning, IADL retraining    Mobility   supervision gait with rollator, minA gait no AD, supervision transfers  mod I - ambulatory, transfers  Balance, gait noAD, endurance   Communication             Safety/Cognition/ Behavioral Observations            Pain   C/o soreness at surgical site  Pain < 3  Assess Qshift and PRN, Utilize tylenol if needed   Skin   New surgical incision for placement  Prevent breakdown and infection  Assess Qshift and PRN      *See Care Plan and progress notes for long and short-term goals.     Barriers to Discharge  Current Status/Progress Possible Resolutions Date Resolved   Physician    Inaccessible home environment;Medical stability;Nutrition means  initiating feeds today  PEG placed ready for D/C in am  D/C hfome after PEG training completed      Nursing                  PT                    OT Pending surgery;Medical stability                SLP                SW  Discharge Planning/Teaching Needs:  PEG placed yesterday and beginning tube feeds, son and daughter in-law coming in for education today.      Team Discussion:  Begin tube feedings and educate pt, son and daughter in-law who is here. Reached mod/i-supervision mobility goals. Should reach goal rate by tomorrow am-ready for DC home tomorrow afternoon.   Revisions to Treatment Plan:  DC 11/8    Continued Need for Acute Rehabilitation Level of Care: The patient requires daily medical management by a physician with specialized training in physical medicine and rehabilitation for the following conditions: Daily direction of a multidisciplinary physical rehabilitation program to ensure safe treatment while eliciting the highest outcome that is of practical value to the patient.: Yes Daily medical management of  patient stability for increased activity during participation in an intensive rehabilitation regime.: Yes Daily analysis of laboratory values and/or radiology reports with any subsequent need for medication adjustment of medical intervention for : Nutritional problems;Neurological problems  Lucy ChrisDupree, Dynisha Due G 11/18/2016, 12:51 PM

## 2016-11-18 NOTE — Progress Notes (Signed)
Social Work Patient ID: [name], female   DOB: 03/29/1940, 76 y.o.   MRN: [id number]  Met with pt, son and daughter in-law to discuss team conference, beginning tube feedings and plan for discharge tomorrow afternoon onnc pt reaches goal rate and is stable. She ws not feeling well this am due to 36 hrs without tube feeds. Son and daughter in-law were educated on tube feeds and how she moves around. Will order equipment and follow up for tomorrow.

## 2016-11-18 NOTE — Progress Notes (Signed)
Occupational Therapy Discharge Summary  Patient Details  Name: [name] MRN: [id number] Date of Birth: July 24, 1940  Today's Date: 11/18/2016 OT Individual Time: 0900-0950 OT Individual Time Calculation (min): 50 min    Patient has met 8 of 9 long term goals due to improved activity tolerance, improved balance and ability to compensate for deficits.  Patient to discharge at overall Modified Independent level with recommendation for S for safety during shower transfers and IADLs.  Patient's care partner is independent to provide the necessary physical and cognitive assistance at discharge.    Reasons goals not met: Pt continues to require S for safety while stepping in/out of tub.  Pt and her family participated in training and demonstrated ability to provide S at DC.  Recommendation:  Patient will benefit from ongoing skilled OT services in home health setting to continue to advance functional skills in the area of iADL and dynamic standing balance during functional mobility  Equipment: Recommended use of shower seat  Reasons for discharge: discharge from hospital  Patient/family agrees with progress made and goals achieved: Yes   OT treatment session: Pt seated in recliner and family arrived shortly after OT's arrival.  Family education/training completed, specifically regarding tub transfers, home safety and fall prevention, home modifications/DME set-up, and covering peg tube prior to showers.  Pt and family asked appropriate questions and were engaged throughout training, both reporting feeling comfortable with [facility] home.  Pt completed tub transfer with S for safety.  Radiology present during session to assess peg tube site, notified RN.  OT Discharge Precautions/Restrictions  Precautions Precautions: Fall Restrictions Weight Bearing Restrictions: No Pain Pain Assessment Pain Assessment: No/denies pain ADL ADL ADL Comments: see functional navigator Vision Baseline  Vision/History: Wears glasses Wears Glasses: At all times Patient Visual Report: No change from baseline Eye Alignment: Within Functional Limits Perception  Perception: Within Functional Limits Praxis Praxis: Intact Cognition Overall Cognitive Status: Within Functional Limits for tasks assessed Arousal/Alertness: Awake/alert Orientation Level: Oriented X4 Attention: Selective Selective Attention: Appears intact Memory: Appears intact Awareness: Appears intact Problem Solving: Appears intact Safety/Judgment: Appears intact Sensation Sensation Light Touch: Impaired Detail Light Touch Impaired Details: Impaired RUE Proprioception: Appears Intact Coordination Gross Motor Movements are Fluid and Coordinated: Yes Fine Motor Movements are Fluid and Coordinated: Yes Motor  Motor Motor - Skilled Clinical Observations: Decreased dynamic standing balance without UE support Mobility  Transfers Transfers: Sit to Stand;Stand to Sit Sit to Stand: 6: Modified independent (Device/Increase time) Stand to Sit: 6: Modified independent (Device/Increase time)  Trunk/Postural Assessment  Cervical Assessment Cervical Assessment: Within Functional Limits Thoracic Assessment Thoracic Assessment: Within Functional Limits Lumbar Assessment Lumbar Assessment: Within Functional Limits Postural Control Postural Control: Deficits on evaluation Righting Reactions: delayed  Balance Dynamic Sitting Balance Sitting balance - Comments: Mod I with dynamic sitting during BADLs/IADLs Extremity/Trunk Assessment RUE Assessment RUE Assessment: Within Functional Limits LUE Assessment LUE Assessment: Within Functional Limits   See Function Navigator for Current Functional Status.  Marcella Dubs 11/18/2016, 12:56 PM

## 2016-11-18 NOTE — Progress Notes (Signed)
Referring Physician(s): Dr. Wynn BankerKirsteins  Supervising Physician: Jolaine ClickHoss, Arthur  [facility]  Chief Complaint: Dysphagia  Subjective: Sitting in chair. Hungry. With questions related to gastrostomy tube.   Allergies: Lisinopril and Lisinopril  Medications: Prior to Admission medications   Medication Sig Start Date End Date Taking? Authorizing Provider  acetaminophen (TYLENOL) 325 MG tablet Take 2 tablets (650 mg total) by mouth every 6 (six) hours as needed for moderate pain. 11/03/16   Laverna PeaceNettey, Shayla D, MD  aspirin 300 MG suppository Place 1 suppository (300 mg total) rectally daily. 11/04/16   Laverna PeaceNettey, Shayla D, MD  atorvastatin (LIPITOR) 40 MG tablet Take 1 tablet (40 mg total) by mouth daily at 6 PM. 11/03/16   Roberto ScalesNettey, Shayla D, MD  Cholecalciferol (VITAMIN D3) 5000 units CAPS Take 5,000 Units by mouth daily.    [provider]  clopidogrel (PLAVIX) 75 MG tablet Take 1 tablet (75 mg total) by mouth daily. 11/04/16   Laverna PeaceNettey, Shayla D, MD  diazepam (VALIUM) 5 MG tablet Take 1-2 tablets (5-10 mg total) by mouth every 6 (six) hours as needed for muscle spasms. 06/03/16   Julio SicksPool, Henry, MD  heparin 5000 UNIT/ML injection Inject 1 mL (5,000 Units total) into the skin every 8 (eight) hours. 11/03/16   Laverna PeaceNettey, Shayla D, MD  HYDROcodone-acetaminophen (NORCO) 10-325 MG tablet Take 1-2 tablets by mouth every 4 (four) hours as needed (breakthrough pain). 06/03/16   Julio SicksPool, Henry, MD  insulin aspart (NOVOLOG) 100 UNIT/ML injection Inject 0-15 Units into the skin every 4 (four) hours. 11/03/16   Roberto ScalesNettey, Shayla D, MD  labetalol (NORMODYNE,TRANDATE) 5 MG/ML injection Inject 2-4 mLs (10-20 mg total) into the vein every 2 (two) hours as needed (Use only if SBP > 180). 11/03/16   Laverna PeaceNettey, Shayla D, MD  losartan-hydrochlorothiazide (HYZAAR) 100-12.5 MG tablet Take 1 tablet by mouth daily. 04/23/16   [provider]  losartan-hydrochlorothiazide (HYZAAR) 100-12.5 MG tablet  HOLD until BP can tolerate 11/03/16   Roberto ScalesNettey, Shayla D, MD  mouth rinse LIQD solution 15 mLs by Mouth Rinse route 2 (two) times daily. 11/03/16   Laverna PeaceNettey, Shayla D, MD  Multiple Vitamin (MULTIVITAMIN WITH MINERALS) TABS tablet Take 1 tablet by mouth daily.    [provider]  naproxen sodium (ANAPROX) 220 MG tablet Take 220 mg by mouth daily as needed (pain).    [provider]  Nutritional Supplements (FEEDING SUPPLEMENT, JEVITY 1.2 CAL,) LIQD Place 1,000 mLs into feeding tube continuous. 11/03/16   Roberto ScalesNettey, Shayla D, MD  polyethylene glycol (MIRALAX / GLYCOLAX) packet Take 17 g by mouth daily. 11/04/16   Laverna PeaceNettey, Shayla D, MD  senna-docusate (SENOKOT-S) 8.6-50 MG tablet Take 2 tablets by mouth 2 (two) times daily. 11/03/16   Roberto ScalesNettey, Shayla D, MD  sertraline (ZOLOFT) 50 MG tablet Take 50 mg by mouth every evening. 04/23/16   [provider]  sertraline (ZOLOFT) 50 MG tablet Take 50 mg by mouth daily.    [provider]  traMADol (ULTRAM) 50 MG tablet Take 50 mg by mouth 3 (three) times daily as needed for pain. 05/06/16   [provider]  traMADol Janean Sark(ULTRAM) 50 MG tablet HOLD until seen by PCP 11/03/16   Laverna PeaceNettey, Shayla D, MD  Water For Irrigation, Sterile (FREE WATER) SOLN Place 200 mLs into feeding tube every 8 (eight) hours. 11/03/16   Laverna PeaceNettey, Shayla D, MD  white petrolatum (VASELINE) OINT Apply 1 application topically as needed for lip care. 11/03/16   Roberto ScalesNettey, Shayla  D, MD     Vital Signs: BP (!) 123/56 (BP Location: Left Arm)   Pulse 92   Temp 97.9 F (36.6 C) (Oral)   Resp 18   Wt 131 lb 13.4 oz (59.8 kg)   SpO2 100%   BMI 25.75 kg/m   Physical Exam  NAD, alert Abd:  Gastrostomy in place.  Site intact.  Skin c/d/i. [place] appropriately placed. G-tube ready for use.   Imaging: Ir Gastrostomy Tube Mod Sed  Result Date: 11/17/2016 CLINICAL DATA:  Dysphagia, previous stroke, needs enteral feeding support EXAM: PERC PLACEMENT GASTROSTOMY  FLUOROSCOPY TIME:  2.5 minutes, 102 uGym2 DAP TECHNIQUE: The procedure, risks, benefits, and alternatives were explained to the patient. Questions regarding the procedure were encouraged and answered. The patient understands and consents to the procedure. As antibiotic prophylaxis, cefazolin 2 g was ordered pre-procedure and administered intravenously within one hour of incision. A safe percutaneous approach had been reviewed on prior CT imaging. A 5 French angiographic catheter was placed as orogastric tube. The upper abdomen was prepped with Betadine, draped in usual sterile fashion, and infiltrated locally with 1% lidocaine. Intravenous Fentanyl and Versed were administered as conscious sedation during continuous monitoring of the patient's level of consciousness and physiological / cardiorespiratory status by the radiology RN, with a total moderate sedation time of 11 minutes. Stomach was insufflated using air through the orogastric tube. An 7118 French sheath needle was advanced percutaneously into the gastric lumen under fluoroscopy. Gas could be aspirated and a small contrast injection confirmed intraluminal spread. The sheath was exchanged over a guidewire for a 9 [place]French vascular sheath, through which the snare device was advanced and used to snare a guidewire passed through the orogastric tube. This was withdrawn, and the snare attached to the 20 French pull-through gastrostomy tube, which was advanced antegrade, positioned with the internal bumper securing the anterior gastric wall to the anterior abdominal wall. Small contrast injection confirms appropriate positioning. The external bumper was applied and the catheter was flushed. COMPLICATIONS: COMPLICATIONS none IMPRESSION: 1. Technically successful 20 French pull-through gastrostomy placement under fluoroscopy. Electronically Signed   By: Corlis Leak  Hassell M.D.   On: 11/17/2016 09:29    Labs:  CBC: Recent Labs    10/30/16 0420 11/03/16 1856  11/04/16 0621 11/12/16 0844  WBC 11.0* 12.8* 12.7* 9.7  HGB 10.9* 10.6* 10.5* 10.7*  HCT 33.5* 32.3* 32.7* 34.4*  PLT 249 268 275 411*    COAGS: Recent Labs    10/28/16 1930 11/12/16 0844  INR 0.97 0.96  APTT 25  --     BMP: Recent Labs    10/29/16 0110 10/30/16 0420 11/03/16 1856 11/04/16 0621 11/09/16 1113  NA 134* 141  --  144 145  K 3.3* 3.8  --  3.4* 4.2  CL 97* 107  --  100* 106  CO2 25 26  --  33* 29  GLUCOSE 164* 102*  --  111* 121*  BUN 23* 20  --  25* 44*  CALCIUM 9.2 8.8*  --  9.0 9.7  CREATININE 0.86 0.75 0.68 0.71 0.80  GFRNONAA >60 >60 >60 >60 >60  GFRAA >60 >60 >60 >60 >60    LIVER FUNCTION TESTS: Recent Labs    10/28/16 1930 11/04/16 0621  BILITOT 0.8 0.6  AST 32 27  ALT 21 26  ALKPHOS 116 102  PROT 7.8 6.6  ALBUMIN 4.4 3.5    Assessment and Plan: Dysphagia Patient s/p gastrostomy tube placement yesterday.  Remains intact today. Site without  issues.  G-tube ready for use.  Patient and family with question about use at home which are answered. IR available if needed.   Electronically Signed: Hoyt Koch, PA 11/18/2016, 12:29 PM   I spent a total of 15 Minutes at the the patient's bedside AND on the patient's hospital floor or unit, greater than 50% of which was counseling/coordinating care for dysphagia.

## 2016-11-18 NOTE — Discharge Summary (Signed)
[place]:  [name], [name]                 ACCOUNT NO.:  [id number]  MEDICAL RECORD NO.:  [id number]  LOCATION:                                 FACILITY:  PHYSICIAN:  Erick ColaceAndrew E. Kirsteins, M.D.   DATE OF BIRTH:  DATE OF ADMISSION:  11/03/2016 DATE OF DISCHARGE:  11/19/2016                              DISCHARGE SUMMARY   DISCHARGE DIAGNOSES: 1. Right cerebellar and left medullary infarction. 2. Subcutaneous heparin for DVT prophylaxis. 3. Pain management. 4. Depression. 5. Dysphagia, status post PEG tube on November 17, 2016. 6. Hyperlipidemia.  This is a 76 year old right-handed female with history of hypertension who lives alone, independent prior to admission.  Presented on October 28, 2016, after syncopal episode.  She became dizzy, had episode of vomiting.  Cranial CT scan negative.  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  MRI showed multiple brain stem and cerebellar infarctions.  No acute hemorrhage. Troponin negative.  Hypokalemia, 2.6, with supplement added. Echocardiogram with ejection fraction of 60%, grade 1 diastolic dysfunction.  The patient did initially require intubation, extubated on October 29, 2016.  Neurology consulted, maintained on aspirin and Plavix for CVA prophylaxis and recommendations of 30-day event monitor as an outpatient.  Subcutaneous heparin for DVT prophylaxis.  Nasogastric tube in place for nutritional support due to dysphagia, followed by Speech Therapy.  The patient was admitted for a comprehensive rehab program.  PAST MEDICAL HISTORY:  See discharge diagnoses.  SOCIAL HISTORY:  Lives alone, independent prior to admission.  FUNCTIONAL STATUS UPON ADMISSION TO REHAB SERVICES:  Minimal guard 15 feet without assistive device, minimal assist sit to stand, min assist activities of daily living.  PHYSICAL EXAMINATION:  VITAL SIGNS:   Blood pressure 131/86, pulse 98, temperature 98, and respirations 20. GENERAL:  This was an alert female, in no acute distress. HEENT:  Nasogastric tube in place.  EOMs intact. NECK:  Supple, nontender.  No JVD. CARDIAC:  Rate controlled. ABDOMEN:  Soft, nontender.  Good bowel sounds. LUNGS:  Clear to auscultation.  Motor strength 4/5 to all 4 limbs.  REHABILITATION HOSPITAL COURSE:  The patient was admitted to Inpatient Rehab Services with therapies initiated on a 3-hour daily basis, consisting of physical therapy, occupational therapy, speech therapy, and rehabilitation nursing.  The following issues were addressed during the patient's rehab stay.  Pertaining to Mrs. River's right cerebellar left medullary infarction, remained stable, maintained on aspirin and Plavix therapy.  She would follow up with Neurology Services. Subcutaneous heparin for DVT prophylaxis, later discontinued as the patient was ambulatory.  She remained on Zoloft for history of depression, emotional support provided and she was attending full therapies.  Blood pressures controlled, on no current antihypertensive medications.  Maintained on Lipitor for hyperlipidemia.  Obstacles of dysphagia secondary to CVA.  A gastrostomy tube was placed on November 17, 2016, per Interventional Radiology.  Tube feeds initiated.  The patient received weekly collaborative interdisciplinary team conferences to discuss estimated length of stay, family teaching, any barriers to her discharge.  The patient was ambulating extended household distances with assistive  device.  Participated in standing balance activities. Initially required some minimal assist to maintain her balance.  She could gather her belongings for activities of daily living and homemaking, ambulate to the day room using her rolling walker.  Full family teaching was completed and plan discharge to home.  DISCHARGE MEDICATIONS: 1. Aspirin 325 mg daily. 2. Lipitor  40 mg daily. 3. Plavix 75 mg daily. 4. Free water 200 mL every 8 hours. 5. Zoloft 50 mg daily. 6. Jevity 240 mL 5 times daily tube feeds as directed by PEG tube.  The patient would remain in p.o.  Follow up with Dr. Claudette LawsAndrew Kirsteins [facility] as directed; Dr. Roda ShuttersXu, Neurology Services, call for appointment; Dr. Oley Balmaniel Hassell, Interventional Radiology.     [name], P.A.   ______________________________ Erick ColaceAndrew E. Kirsteins, M.D.    DA/MEDQ  D:  11/18/2016  T:  11/18/2016  Job:  [id number]  cc:   Dr. Roda ShuttersXu D. Oley Balmaniel Hassell III, M.D. Erick ColaceAndrew E. Kirsteins, M.D.

## 2016-11-18 NOTE — Progress Notes (Signed)
Pt has administered medications, water flushes, and feedings via tube today.  Pt has tolerated feeding via tube.  Will continue to increase at tolerated per MD order.

## 2016-11-18 NOTE — Progress Notes (Signed)
Nutrition Follow-up  DOCUMENTATION CODES:   Not applicable  INTERVENTION:   -Initiate Jevity 1.5 boluses at 100 mL and advance every 6 hours by 100 mL to goal rate of 300 mL 4 times/day to provide 1800kcal (100% of needs), 77grams of protein, and91312ml of H2O.   - Continue free water flushes of 200 ml q 8 hours. Total free water: 156012ml/day.  NUTRITION DIAGNOSIS:   Inadequate oral intake related to inability to eat as evidenced by NPO status. Ongoing  GOAL:   Patient will meet greater than or equal to 90% of their needs Progressing, pt began bolus feeds at 11am  MONITOR:   TF tolerance, Weight trends, Labs, I & O's, Skin   ASSESSMENT:   76 y.o. right handed female with history of hypertension. Presented 10/28/2016 after syncopal episode. 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 MRI of the brain reviewed, showing multiple brainstem and cerebellar infarcts. Per report, several small areas of acute early subacute infarction involving left medulla and scattered throughout the left greater than right mid to inferior cerebellar hemispheres as well as vermis. Currently NPO.  Pt reports that her first bolus feeding went well and the RN taught her and her family how to do it. Pt is excited to be off of continuous feeds. Spoke with RN who began bolus feeds at 11am. Pt to continue bolus feeds as she prepares for discharge.  Medications and labs reviewed. Will continue to monitor.  Diet Order:  No diet orders on file  EDUCATION NEEDS:   No education needs identified at this time  Skin:  Skin Assessment: Reviewed RN Assessment  Last BM:  11/5  Height:   Ht Readings from Last 1 Encounters:  11/01/16 5' (1.524 m)    Weight:   Wt Readings from Last 1 Encounters:  11/18/16 131 lb 13.4 oz (59.8 kg)    Ideal Body Weight:  45.45 kg  BMI:  Body mass index is 25.75 kg/m.  Estimated Nutritional Needs:    Kcal:  1600-1800  Protein:  65-80 grams  Fluid:  1.6 - 1.8 L/day    Wynetta EmeryGrace Herman [facility] Dietetic Intern Pager: [phone] 11/18/2016 11:34 AM

## 2016-11-18 NOTE — Progress Notes (Signed)
Subjective/Complaints:  Patient remains n.p.o., PEG tube placed yesterday morning.  Tolerating medications through feeding tube.  Appreciate PT OT and speech therapy notes.  Patient feels a little tired today and a bit dizzy which she attributes to being in bed all day yesterday. No IR orders in regards to when tube feeding should be started.  Appreciate dietary note.  ROS: pt denies nausea, vomiting, diarrhea, cough, shortness of breath or chest pain   Objective: Vital Signs: Blood pressure (!) 123/56, pulse 92, temperature 97.9 F (36.6 C), temperature source Oral, resp. rate 18, weight 59.8 kg (131 lb 13.4 oz), SpO2 100 %. Ir Gastrostomy Tube Mod Sed  Result Date: 11/17/2016 CLINICAL DATA:  Dysphagia, previous stroke, needs enteral feeding support EXAM: PERC PLACEMENT GASTROSTOMY FLUOROSCOPY TIME:  2.5 minutes, 102 uGym2 DAP TECHNIQUE: The procedure, risks, benefits, and alternatives were explained to the patient. Questions regarding the procedure were encouraged and answered. The patient understands and consents to the procedure. As antibiotic prophylaxis, cefazolin 2 g was ordered pre-procedure and administered intravenously within one hour of incision. A safe percutaneous approach had been reviewed on prior CT imaging. A 5 French angiographic catheter was placed as orogastric tube. The upper abdomen was prepped with Betadine, draped in usual sterile fashion, and infiltrated locally with 1% lidocaine. Intravenous Fentanyl and Versed were administered as conscious sedation during continuous monitoring of the patient's level of consciousness and physiological / cardiorespiratory status by the radiology RN, with a total moderate sedation time of 11 minutes. Stomach was insufflated using air through the orogastric tube. An 67 French sheath needle was advanced percutaneously into the gastric lumen under fluoroscopy. Gas could be aspirated and a small contrast injection confirmed intraluminal spread.  The sheath was exchanged over a guidewire for a 9 [place] vascular sheath, through which the snare device was advanced and used to snare a guidewire passed through the orogastric tube. This was withdrawn, and the snare attached to the 20 French pull-through gastrostomy tube, which was advanced antegrade, positioned with the internal bumper securing the anterior gastric wall to the anterior abdominal wall. Small contrast injection confirms appropriate positioning. The external bumper was applied and the catheter was flushed. COMPLICATIONS: COMPLICATIONS none IMPRESSION: 1. Technically successful 20 French pull-through gastrostomy placement under fluoroscopy. Electronically Signed   By: Lucrezia Europe M.D.   On: 11/17/2016 09:29   Results for orders placed or performed during the hospital encounter of 11/03/16 (from the past 72 hour(s))  Glucose, capillary     Status: Abnormal   Collection Time: 11/15/16 12:15 PM  Result Value Ref Range   Glucose-Capillary 113 (H) 65 - 99 mg/dL  Glucose, capillary     Status: None   Collection Time: 11/15/16  4:31 PM  Result Value Ref Range   Glucose-Capillary 83 65 - 99 mg/dL  Surgical pcr screen     Status: Abnormal   Collection Time: 11/15/16  6:32 PM  Result Value Ref Range   MRSA, PCR NEGATIVE NEGATIVE   Staphylococcus aureus POSITIVE (A) NEGATIVE    Comment: (NOTE) The Xpert SA Assay (FDA approved for NASAL specimens in patients 21 years of age and older), is one component of a comprehensive surveillance program. It is not intended to diagnose infection nor to guide or monitor treatment. Performed at [facility], [place] [address], [place], [place] [address]   Glucose, capillary     Status: Abnormal   Collection Time: 11/15/16  7:58 PM  Result Value Ref Range   Glucose-Capillary 115 (  H) 65 - 99 mg/dL   Comment 1 Notify RN   Glucose, capillary     Status: Abnormal   Collection Time: 11/15/16 11:48 PM  Result Value Ref Range    Glucose-Capillary 133 (H) 65 - 99 mg/dL  Glucose, capillary     Status: Abnormal   Collection Time: 11/16/16  4:16 AM  Result Value Ref Range   Glucose-Capillary 104 (H) 65 - 99 mg/dL   Comment 1 Notify RN   Glucose, capillary     Status: None   Collection Time: 11/16/16 11:46 AM  Result Value Ref Range   Glucose-Capillary 98 65 - 99 mg/dL  Glucose, capillary     Status: None   Collection Time: 11/16/16  4:53 PM  Result Value Ref Range   Glucose-Capillary 72 65 - 99 mg/dL  Glucose, capillary     Status: Abnormal   Collection Time: 11/16/16  8:28 PM  Result Value Ref Range   Glucose-Capillary 112 (H) 65 - 99 mg/dL  Glucose, capillary     Status: Abnormal   Collection Time: 11/16/16 11:58 PM  Result Value Ref Range   Glucose-Capillary 138 (H) 65 - 99 mg/dL  Glucose, capillary     Status: Abnormal   Collection Time: 11/17/16  4:09 AM  Result Value Ref Range   Glucose-Capillary 100 (H) 65 - 99 mg/dL  Glucose, capillary     Status: None   Collection Time: 11/17/16 12:10 PM  Result Value Ref Range   Glucose-Capillary 77 65 - 99 mg/dL  Glucose, capillary     Status: None   Collection Time: 11/17/16  4:55 PM  Result Value Ref Range   Glucose-Capillary 92 65 - 99 mg/dL  Glucose, capillary     Status: None   Collection Time: 11/17/16  7:26 PM  Result Value Ref Range   Glucose-Capillary 92 65 - 99 mg/dL  Glucose, capillary     Status: None   Collection Time: 11/17/16 11:49 PM  Result Value Ref Range   Glucose-Capillary 80 65 - 99 mg/dL  Glucose, capillary     Status: None   Collection Time: 11/18/16  3:48 AM  Result Value Ref Range   Glucose-Capillary 75 65 - 99 mg/dL     HEENT: normal Cardio:.RRR without murmur. No JVD  Resp: CTA Bilaterally without wheezes or rales. Normal effort   GI: BS positive and Nontender and nondistended Extremity:  No Edema Skin:   Intact Neuro: Alert/Oriented and Other Motor strength is 5/5 bilateral deltoid bicep tricep grip hip flexor and  extensor ankle dorsiflexor, no dysmetria on finger-nose-finger testing. No nystagmus Musc/Skel:  Other No pain with upper limb or lower limb range of motion General no acute distress   Assessment/Plan: 1. 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 Physiatrist is providing close team supervision and 24 hour management of active medical problems listed below. Physiatrist and rehab team continue to assess barriers to discharge/monitor patient progress toward functional and medical goals. FIM: Function - Bathing Position: Shower Body parts bathed by patient: Right arm, Left arm, Chest, Abdomen, Front perineal area, Buttocks, Right upper leg, Left upper leg, Left lower leg, Right lower leg, Back Body parts bathed by helper: Back Assist Level: Set up Set up : To obtain items  Function- Upper Body Dressing/Undressing What is the patient wearing?: Pull over shirt/dress, Bra Bra - Perfomed by patient: Thread/unthread right  bra strap, Thread/unthread left bra strap, Hook/unhook bra (pull down sports bra) Pull over shirt/dress - Perfomed by patient: Thread/unthread right sleeve, Thread/unthread left sleeve, Put head through opening, Pull shirt over trunk Assist Level: More than reasonable time Function - Lower Body Dressing/Undressing What is the patient wearing?: Underwear, Shoes, Pants Position: Other (comment)(sitting in recliner) Underwear - Performed by patient: Thread/unthread right underwear leg, Thread/unthread left underwear leg, Pull underwear up/down Pants- Performed by patient: Thread/unthread right pants leg, Thread/unthread left pants leg, Pull pants up/down, Fasten/unfasten pants Non-skid slipper socks- Performed by patient: Don/doff right sock, Don/doff left sock Shoes - Performed by patient: Don/doff right shoe,  Don/doff left shoe Assist for footwear: Setup Assist for lower body dressing: Supervision or verbal cues  Function - Toileting Toileting steps completed by patient: Adjust clothing prior to toileting, Performs perineal hygiene, Adjust clothing after toileting Toileting Assistive Devices: Grab bar or rail Assist level: Supervision or verbal cues  Function - Air cabin crew transfer assistive device: Landscape architect, Pension scheme manager level to toilet: Supervision or verbal cues Assist level from toilet: Supervision or verbal cues  Function - Chair/bed transfer Chair/bed transfer method: Stand pivot Chair/bed transfer assist level: Supervision or verbal cues Chair/bed transfer assistive device: Armrests Chair/bed transfer details: Tactile cues for posture, Verbal cues for precautions/safety  Function - Locomotion: Wheelchair Will patient use wheelchair at discharge?: No Wheelchair activity did not occur: N/A Wheel 50 feet with 2 turns activity did not occur: N/A Wheel 150 feet activity did not occur: N/A Function - Locomotion: Ambulation Assistive device: No device Max distance: 11f  Assist level: Touching or steadying assistance (Pt > 75%) Assist level: Touching or steadying assistance (Pt > 75%) Assist level: Touching or steadying assistance (Pt > 75%) Walk 150 feet activity did not occur: Safety/medical concerns Assist level: Moderate assist (Pt 50 - 74%) Assist level: Touching or steadying assistance (Pt > 75%)  Function - Comprehension Comprehension: Auditory Comprehension assist level: Follows complex conversation/direction with extra time/assistive device  Function - Expression Expression: Verbal Expression assist level: Expresses complex ideas: With extra time/assistive device  Function - Social Interaction Social Interaction assist level: Interacts appropriately with others with medication or extra time (anti-anxiety, antidepressant).  Function - Problem  Solving Problem solving assist level: Solves complex problems: With extra time  Function - Memory Memory assist level: More than reasonable amount of time Patient normally able to recall (first 3 days only): Current season, Staff names and faces, That he or she is in a hospital, Location of own room  Medical Problem List and Plan: 1.  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 Recommendations of 30 day event monitor as outpatient             -CIR PT, OT, SLP-plan discharge after PEG feeds are established Team conference today please see physician documentation under team conference tab, met with team face-to-face to discuss problems,progress, and goals. Formulized individual treatment plan based on medical history, underlying problem and comorbidities.  2.  DVT Prophylaxis/Anticoagulation: Subcutaneous heparin.--dc, encourage ambulation, on ASA and Plavix, Plavix is on hold for the procedure 3. Pain Management: Tylenol as needed 4. Mood: Zoloft 50 mg daily 5. Neuropsych: This patient is capable of making decisions on her own behalf. 6. Skin/Wound Care: Routine skin checks 7. Fluids/Electrolytes/Nutrition: tolerating TF 8. Dysphagia. Patient with tube feeds   -  Repeat MBS 10/29- no improvement  -PEG 11/6  9. Hypertension. Permissive hypertension. Monitor of increased mobility. Patient on Hyzaar 100-12 0.5 mg daily prior to admission. bp's controlled 11/7 Vitals:   11/17/16 1409 11/18/16 0400  BP: (!) 129/51 (!) 123/56  Pulse: (!) 106 92  Resp: 18 18  Temp: 98.4 F (36.9 C) 97.9 F (36.6 C)  SpO2: 97% 100%   10. Hyperlipidemia. Lipitor   LOS (Days) 15 A FACE TO FACE EVALUATION WAS PERFORMED  KIRSTEINS,ANDREW E 11/18/2016, 9:28 AM

## 2016-11-18 NOTE — Progress Notes (Signed)
Physical Therapy Discharge Summary  Patient Details  Name: [name] MRN: [id number] Date of Birth: [birth year]  Today's Date: 11/18/2016 PT Individual Time: 0800-0900 AND 1300-1330 PT Individual Time Calculation (min): 60 min AND 30 min    Patient has met 9 of 9 long term goals due to improved activity tolerance, improved balance, improved postural control, increased strength, increased range of motion, ability to compensate for deficits and improved coordination.  Patient to discharge at an ambulatory level Modified Independent.   Patient's care partner is independent to provide the necessary physical assistance at discharge.  Reasons goals not met: all PT goals met    Recommendation:  Patient will benefit from ongoing skilled PT services in home health setting to continue to advance safe functional mobility, address ongoing impairments in balance, safety, coordination, gait , and minimize fall risk.  Equipment: No equipment provided  Reasons for discharge: treatment goals met and discharge from hospital  Patient/family agrees with progress made and goals achieved: Yes   PT therapy:   Session 1 Pt received sitting in WC and agreeable to PT. PT instructed pt in Grad day assessment to measure progress toward goals. See below for details.   FOllowing assessment of bed mobility, pt reports nausea. Vitals assessed; BP 141/79, HR 110. Nausea/dizziness continues for approximately 4 minutes, vitals assessed again once sx decreased; 135/73, HR 103.  Patient returned to room and left sitting in [facility] with call bell in reach and all needs met.  Session 2.  Pt received sitting in WC and agreeable to PT. Gait training instructed by PT without cues or assist with Rollator. Pt performed all transfers without assist from PT and UE support with rollator, Pt retrieved laundry from dryer with 1 UE support on rollator and no assist or cues from PT. Patient returned to room and left sitting in [facility] with  call bell in reach and all needs met.     PT Discharge Precautions/Restrictions Precautions Precautions: Fall Restrictions Weight Bearing Restrictions: No Pain Pain Assessment Pain Assessment: No/denies pain Vision/Perception  Vision - Assessment Eye Alignment: Within Functional Limits Perception Perception: Within Functional Limits Praxis Praxis: Intact  Cognition Overall Cognitive Status: Within Functional Limits for tasks assessed Arousal/Alertness: Awake/alert Orientation Level: Oriented X4 Attention: Selective Selective Attention: Appears intact Memory: Appears intact Awareness: Appears intact Problem Solving: Appears intact Safety/Judgment: Appears intact Sensation Sensation Light Touch: Impaired Detail Light Touch Impaired Details: Impaired RUE Proprioception: Appears Intact Coordination Gross Motor Movements are Fluid and Coordinated: Yes Fine Motor Movements are Fluid and Coordinated: Yes Motor  Motor Motor - Skilled Clinical Observations: Decreased dynamic standing balance without UE support  Mobility Bed Mobility Rolling Right: 6: Modified independent (Device/Increase time) Rolling Left: 6: Modified independent (Device/Increase time) Supine to Sit: 6: Modified independent (Device/Increase time) Sit to Supine: 6: Modified independent (Device/Increase time) Transfers Sit to Stand: 6: Modified independent (Device/Increase time) Stand to Sit: 6: Modified independent (Device/Increase time) Locomotion  Ambulation Ambulation: Yes Ambulation/Gait Assistance: 6: Modified independent (Device/Increase time) Ambulation Distance (Feet): 200 Feet Assistive device: Rolling walker Gait Gait: Yes Gait Pattern: Impaired Gait Pattern: Lateral hip instability;Narrow base of support Stairs / Additional Locomotion Stairs: Yes Stairs Assistance: 6: Modified independent (Device/Increase time) Stair Management Technique: Two rails Number of Stairs: 12 Height of  Stairs: 6 Ramp: 6: Modified independent (Device)(with Rollator ) Wheelchair Mobility Wheelchair Mobility: No  Trunk/Postural Assessment  Cervical Assessment Cervical Assessment: Within Functional Limits Thoracic Assessment Thoracic Assessment: Within Functional Limits Lumbar Assessment Lumbar Assessment: Within Functional  Limits Postural Control Postural Control: Deficits on evaluation Righting Reactions: delayed  Balance Balance Balance Assessed: Yes Dynamic Sitting Balance Sitting balance - Comments: Mod I  Dynamic Standing Balance Dynamic Standing - Balance Support: During functional activity Dynamic Standing - Level of Assistance: 6: Modified independent (Device/Increase time)(with Rollator, Mod I - no AD , min assist) Extremity Assessment  RUE Assessment RUE Assessment: Within Functional Limits LUE Assessment LUE Assessment: Within Functional Limits RLE Assessment RLE Assessment: Exceptions to [facility] RLE Strength RLE Overall Strength Comments: 4+/5 proximal to distal  LLE Assessment LLE Assessment: Exceptions to [facility] LLE Strength LLE Overall Strength Comments: 4+/5 proximal to distal    See Function Navigator for Current Functional Status.  Lorie Phenix 11/18/2016, 1:19 PM

## 2016-11-18 NOTE — Discharge Summary (Signed)
Discharge summary job # [id number]

## 2016-11-18 NOTE — Progress Notes (Signed)
Occupational Therapy Session Note  Patient Details  Name: [name] MRN: [id number] Date of Birth: [birth year]  Today's Date: 11/18/2016 OT Individual Time: 1040-1100 OT Individual Time Calculation (min): 20 min   Short Term Goals: Week 1:  OT Short Term Goal 1 (Week 1): LTG=STG OT Short Term Goal 1 - Progress (Week 1): Progressing toward goal  Skilled Therapeutic Interventions/Progress Updates:    OT treatment session focused on home management. Pt collected clothing and placed into bag with supervision for balance. Pt ambulated to laundry room using rollator walker to transport clothing. Educated pt on rollator placement to access top loader washer, then pt able to load clothing without assist. Discussed home laundry room set-up and energy conservation techniques. Pt ambulated back to room and left seated in recliner with needs met.   Therapy Documentation Precautions:  Precautions Precautions: Fall Restrictions Weight Bearing Restrictions: No Pain:  none/denies pain ADL: ADL ADL Comments: see functional navigator Other Treatments:    See Function Navigator for Current Functional Status.   Therapy/Group: Individual Therapy  Valma Cava 11/18/2016, 11:35 AM

## 2016-11-18 NOTE — Progress Notes (Signed)
Speech Language Pathology Discharge Summary  Patient Details  Name: [name] MRN: [id number] Date of Birth: December 27, 1940  Today's Date: 11/18/2016 SLP Individual Time: 1100-1200 SLP Individual Time Calculation (min): 60 min   Skilled Therapeutic Interventions: Skilled ST services focused on swallow skills and family education. SLP provided education to family and pt to continue pharyngeal strengthening exercises, EMT and small amounts of thin via TSP or ice chips following oral care with swallow strategies.Pt returned demonstration of EMT x 30  And CTRA X 60 exercises.Pt demonstrated PO consumption of ice chips following oral care with swallow strategies, however after 10 trials demonstrated impairment in able to clear throat and pt request to stop.SLP instructed pt to only consume small amounts of thin/ice chips at a time and stop when swallow demonstrates fatigue. All of pt's and family's questions were answered. Pt left in room with nurtional staff member.     Patient has met 3 of 3 long term goals.  Patient to discharge at overall Modified Independent level.  Reasons goals not met:     Clinical Impression/Discharge Summary:  Pt demonstrated ability to met 3 out 3 swallowing goals at Mod I level. Pt is currently NPO, however is Mod I in return demonstration of EMST x100 per day and pharyngeal strengthening exercises x 100 per day and minmum consumption of TSP sips of thin/ice chips with swallow strategies ( swallow, cough and re-swallow) following oral care in order to improve swallow function and demonstrate readiness for instrumental swallow study. Pt and family was educated on exercises and small amounts of thin PO consumption following oral care and with swallow startegies.  Care Partner:  Caregiver Able to Provide Assistance: Yes  Type of Caregiver Assistance: Physical;Cognitive  Recommendation:  Home Health SLP  Rationale for SLP Follow Up: Maximize swallowing safety   Equipment:  bed capable of 30 degree positioning    Reasons for discharge: Discharged from hospital   Patient/Family Agrees with Progress Made and Goals Achieved: Yes   Function:  Eating Eating   Modified Consistency Diet: No(ice chips) Eating Assist Level: Supervision or verbal cues           Cognition Comprehension Comprehension assist level: Follows complex conversation/direction with extra time/assistive device  Expression   Expression assist level: Expresses complex ideas: With extra time/assistive device  Social Interaction Social Interaction assist level: Interacts appropriately with others with medication or extra time (anti-anxiety, antidepressant).  Problem Solving Problem solving assist level: Solves complex problems: With extra time  Memory Memory assist level: More than reasonable amount of time   Lamyra Malcolm  [facility] 11/18/2016, 12:34 PM

## 2016-11-19 LAB — GLUCOSE, CAPILLARY
GLUCOSE-CAPILLARY: 161 mg/dL — AB (ref 65–99)
GLUCOSE-CAPILLARY: 88 mg/dL (ref 65–99)
Glucose-Capillary: 89 mg/dL (ref 65–99)
Glucose-Capillary: 153 mg/dL — ABNORMAL HIGH (ref 65–99)

## 2016-11-19 MED ORDER — CLOPIDOGREL BISULFATE 75 MG PO TABS: 75.0000 mg | ORAL_TABLET | Freq: Every day | ORAL | 0 refills | Status: DC

## 2016-11-19 MED ORDER — JEVITY 1.5 CAL/FIBER PO LIQD: 300.0000 mL | Freq: Four times a day (QID) | ORAL | 1 refills | Status: DC

## 2016-11-19 MED ORDER — SERTRALINE HCL 50 MG PO TABS: 50.0000 mg | ORAL_TABLET | Freq: Every day | ORAL | 0 refills | Status: DC

## 2016-11-19 MED ORDER — ASPIRIN 325 MG PO TABS: 325.0000 mg | ORAL_TABLET | Freq: Every day | ORAL | Status: DC

## 2016-11-19 MED ORDER — FREE WATER: 200.0000 mL | Freq: Three times a day (TID) | Status: DC

## 2016-11-19 MED ORDER — ATORVASTATIN CALCIUM 40 MG PO TABS: 40.0000 mg | ORAL_TABLET | Freq: Every day | ORAL | 0 refills | Status: DC

## 2016-11-19 MED ORDER — JEVITY 1.5 CAL/FIBER PO LIQD: 240.0000 mL | Freq: Every day | ORAL | Status: DC

## 2016-11-19 MED ORDER — ADULT MULTIVITAMIN W/MINERALS CH: 1.0000 | ORAL_TABLET | Freq: Every day | ORAL | Status: DC

## 2016-11-19 MED ORDER — VITAMIN D3 125 MCG (5000 UT) PO CAPS: 5000.0000 [IU] | ORAL_CAPSULE | Freq: Every day | ORAL | 0 refills | Status: DC

## 2016-11-19 MED ORDER — JEVITY 1.5 CAL/FIBER PO LIQD
240.0000 mL | Freq: Every day | ORAL | Status: DC
  Filled 2016-11-19 (×5): qty 1000

## 2016-11-19 MED ORDER — HYDROCODONE-ACETAMINOPHEN 10-325 MG PO TABS: 1.0000 | ORAL_TABLET | ORAL | 0 refills | Status: DC | PRN

## 2016-11-19 MED ORDER — HYDROCODONE-ACETAMINOPHEN 5-325 MG PO TABS: 1.0000 | ORAL_TABLET | ORAL | 0 refills | Status: DC | PRN

## 2016-11-19 NOTE — Progress Notes (Signed)
Nutrition Follow-up  DOCUMENTATION CODES:   Not applicable  INTERVENTION:   For discharge home, Provide bolus tube feeds of Jevity 1.5 formula at volumes of 240 ml (1 can) 5 times daily via PEG toprovide 1800kcal (100% of needs), 77grams of protein, and949m of H2O.   Provide free water flushes of 200 ml q 8 hours in between feeds. Total free water: 15164mday.   NUTRITION DIAGNOSIS:   Inadequate oral intake related to inability to eat as evidenced by NPO status; ongoing  GOAL:   Patient will meet greater than or equal to 90% of their needs; met  MONITOR:   TF tolerance, Weight trends, Labs, I & O's, Skin  REASON FOR ASSESSMENT:   Consult    ASSESSMENT:   [age].o. right handed female with history of hypertension. Presented 10/28/2016 after syncopal episode. 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 Marland KitchenRI of the brain reviewed, showing multiple brainstem and cerebellar infarcts. Per report, several small areas of acute early subacute infarction involving left medulla and scattered throughout the left greater than right mid to inferior cerebellar hemispheres as well as vermis. Currently NPO. PEG placed.  Plans for discharge today. Per RN, pt reports abdominal fullness and discomfort with bolus feeds at 300 ml. RD to modify tube feeding orders to aid in tolerance. Bolus feeds have been modified to be more frequent thus so boluses can be smaller in volume. Total nutrition remains the same.   Diet Order:  No diet orders on file  EDUCATION NEEDS:   No education needs identified at this time  Skin:  Skin Assessment: Reviewed RN Assessment  Last BM:  11/5  Height:   Ht Readings from Last 1 Encounters:  11/01/16 5' (1.524 m)    Weight:   Wt Readings from Last 1 Encounters:  11/19/16 131 lb 4.8 oz (59.6 kg)    Ideal Body Weight:  45.45 kg  BMI:  Body mass index is 25.64 kg/m.  Estimated  Nutritional Needs:   Kcal:  1600-1800  Protein:  65-80 grams  Fluid:  1.6 - 1.8 L/day    StCorrin ParkerMS, RD, LDN Pager # [phone] hours/ weekend pager # [phone]

## 2016-11-19 NOTE — Progress Notes (Signed)
Subjective/Complaints:  Patient states that she has tolerated 300 mL tube feedings.  Has been learning how to give her own tube feedings and water flushes.  No new problems overnight.  No more dizziness after tube feedings were started.  ROS: pt denies nausea, vomiting, diarrhea, cough, shortness of breath or chest pain   Objective: Vital Signs: Blood pressure 131/66, pulse 95, temperature (!) 97.4 F (36.3 C), temperature source Oral, resp. rate 18, weight 59.6 kg (131 lb 4.8 oz), SpO2 97 %. Ir Gastrostomy Tube Mod Sed  Result Date: 11/17/2016 CLINICAL DATA:  Dysphagia, previous stroke, needs enteral feeding support EXAM: PERC PLACEMENT GASTROSTOMY FLUOROSCOPY TIME:  2.5 minutes, 102 uGym2 DAP TECHNIQUE: The procedure, risks, benefits, and alternatives were explained to the patient. Questions regarding the procedure were encouraged and answered. The patient understands and consents to the procedure. As antibiotic prophylaxis, cefazolin 2 g was ordered pre-procedure and administered intravenously within one hour of incision. A safe percutaneous approach had been reviewed on prior CT imaging. A 5 French angiographic catheter was placed as orogastric tube. The upper abdomen was prepped with Betadine, draped in usual sterile fashion, and infiltrated locally with 1% lidocaine. Intravenous Fentanyl and Versed were administered as conscious sedation during continuous monitoring of the patient's level of consciousness and physiological / cardiorespiratory status by the radiology RN, with a total moderate sedation time of 11 minutes. Stomach was insufflated using air through the orogastric tube. An 66 French sheath needle was advanced percutaneously into the gastric lumen under fluoroscopy. Gas could be aspirated and a small contrast injection confirmed intraluminal spread. The sheath was exchanged over a guidewire for a 9 [place] vascular sheath, through which the snare device was advanced and used to snare a  guidewire passed through the orogastric tube. This was withdrawn, and the snare attached to the 20 French pull-through gastrostomy tube, which was advanced antegrade, positioned with the internal bumper securing the anterior gastric wall to the anterior abdominal wall. Small contrast injection confirms appropriate positioning. The external bumper was applied and the catheter was flushed. COMPLICATIONS: COMPLICATIONS none IMPRESSION: 1. Technically successful 20 French pull-through gastrostomy placement under fluoroscopy. Electronically Signed   By: Corlis Leak M.D.   On: 11/17/2016 09:29   Results for orders placed or performed during the hospital encounter of 11/03/16 (from the past 72 hour(s))  Glucose, capillary     Status: None   Collection Time: 11/16/16 11:46 AM  Result Value Ref Range   Glucose-Capillary 98 65 - 99 mg/dL  Glucose, capillary     Status: None   Collection Time: 11/16/16  4:53 PM  Result Value Ref Range   Glucose-Capillary 72 65 - 99 mg/dL  Glucose, capillary     Status: Abnormal   Collection Time: 11/16/16  8:28 PM  Result Value Ref Range   Glucose-Capillary 112 (H) 65 - 99 mg/dL  Glucose, capillary     Status: Abnormal   Collection Time: 11/16/16 11:58 PM  Result Value Ref Range   Glucose-Capillary 138 (H) 65 - 99 mg/dL  Glucose, capillary     Status: Abnormal   Collection Time: 11/17/16  4:09 AM  Result Value Ref Range   Glucose-Capillary 100 (H) 65 - 99 mg/dL  Glucose, capillary     Status: None   Collection Time: 11/17/16 12:10 PM  Result Value Ref Range   Glucose-Capillary 77 65 - 99 mg/dL  Glucose, capillary     Status: None   Collection Time: 11/17/16  4:55 PM  Result Value  Ref Range   Glucose-Capillary 92 65 - 99 mg/dL  Glucose, capillary     Status: None   Collection Time: 11/17/16  7:26 PM  Result Value Ref Range   Glucose-Capillary 92 65 - 99 mg/dL  Glucose, capillary     Status: None   Collection Time: 11/17/16 11:49 PM  Result Value Ref Range    Glucose-Capillary 80 65 - 99 mg/dL  Glucose, capillary     Status: None   Collection Time: 11/18/16  3:48 AM  Result Value Ref Range   Glucose-Capillary 75 65 - 99 mg/dL  Glucose, capillary     Status: None   Collection Time: 11/18/16  9:26 AM  Result Value Ref Range   Glucose-Capillary 80 65 - 99 mg/dL  Glucose, capillary     Status: Abnormal   Collection Time: 11/18/16 11:50 AM  Result Value Ref Range   Glucose-Capillary 119 (H) 65 - 99 mg/dL  Glucose, capillary     Status: None   Collection Time: 11/18/16  4:50 PM  Result Value Ref Range   Glucose-Capillary 72 65 - 99 mg/dL  Glucose, capillary     Status: None   Collection Time: 11/18/16  7:54 PM  Result Value Ref Range   Glucose-Capillary 99 65 - 99 mg/dL  Glucose, capillary     Status: Abnormal   Collection Time: 11/19/16 12:20 AM  Result Value Ref Range   Glucose-Capillary 161 (H) 65 - 99 mg/dL   Comment 1 Notify RN   Glucose, capillary     Status: None   Collection Time: 11/19/16  4:13 AM  Result Value Ref Range   Glucose-Capillary 88 65 - 99 mg/dL     HEENT: normal Cardio:.RRR without murmur. No JVD  Resp: CTA Bilaterally without wheezes or rales. Normal effort   GI: BS positive and Nontender and nondistended Extremity:  No Edema Skin:   Intact Neuro: Alert/Oriented and Other Motor strength is 5/5 bilateral deltoid bicep tricep grip hip flexor and extensor ankle dorsiflexor, no dysmetria on finger-nose-finger testing. No nystagmus Musc/Skel:  Other No pain with upper limb or lower limb range of motion General no acute distress   Assessment/Plan: 1. 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 Stable for D/C today F/u PCP in 3-4 weeks F/u PM&R 2 weeks See D/C summary See D/C instructionsFIM: Function - Bathing Position: Shower Body parts bathed by patient: Right arm, Left arm, Chest, Abdomen, Front perineal area, Buttocks, Right upper leg, Left upper leg,  Left lower leg, Right lower leg, Back Body parts bathed by helper: Back Assist Level: More than reasonable time Set up : To obtain items  Function- Upper Body Dressing/Undressing What is the patient wearing?: Pull over shirt/dress, Bra Bra - Perfomed by patient: Thread/unthread right bra strap, Thread/unthread left bra strap, Hook/unhook bra (pull down sports bra) Pull over shirt/dress - Perfomed by patient: Thread/unthread right sleeve, Thread/unthread left sleeve, Put head through opening, Pull shirt over trunk Assist Level: More than reasonable time Function - Lower Body Dressing/Undressing What is the patient wearing?: Underwear, Shoes, Pants Position: Other (comment)(sitting in recliner) Underwear - Performed by patient: Thread/unthread right underwear leg, Thread/unthread left underwear leg, Pull underwear up/down Pants- Performed by patient: Thread/unthread right pants leg, Thread/unthread left pants leg, Pull pants up/down, Fasten/unfasten pants Non-skid slipper socks- Performed by patient: Don/doff right sock, Don/doff left sock Shoes - Performed by patient: Don/doff right shoe, Don/doff left shoe Assist for footwear: Independent Assist for lower body dressing:  More than reasonable time  Function - Toileting Toileting steps completed by patient: Adjust clothing prior to toileting, Performs perineal hygiene, Adjust clothing after toileting Toileting Assistive Devices: Grab bar or rail Assist level: No help/no cues  Function - ArchivistToilet Transfers Toilet transfer assistive device: Grab bar, Walker Assist level to toilet: No Help, no cues, assistive device, takes more than a reasonable amount of time Assist level from toilet: No Help, no cues, assistive device, takes more than a reasonable amount of time  Function - Chair/bed transfer Chair/bed transfer method: Stand pivot Chair/bed transfer assist level: No Help, no cues, assistive device, takes more than a reasonable amount of  time Chair/bed transfer assistive device: Armrests Chair/bed transfer details: Tactile cues for posture, Verbal cues for precautions/safety  Function - Locomotion: Wheelchair Will patient use wheelchair at discharge?: No Wheelchair activity did not occur: N/A Wheel 50 feet with 2 turns activity did not occur: N/A Wheel 150 feet activity did not occur: N/A Function - Locomotion: Ambulation Assistive device: Walker-rolling Max distance: 25700ft  Assist level: No help, No cues, assistive device, takes more than a reasonable amount of time Assist level: No help, No cues, assistive device, takes more than a reasonable amount of time Assist level: No help, No cues, assistive device, takes more than a reasonable amount of time Walk 150 feet activity did not occur: Safety/medical concerns Assist level: No help, No cues, assistive device, takes more than a reasonable amount of time Assist level: No help, No cues, assistive device, takes more than a reasonable amount of time  Function - Comprehension Comprehension: Auditory Comprehension assist level: Follows complex conversation/direction with no assist  Function - Expression Expression: Verbal Expression assist level: Expresses complex ideas: With no assist  Function - Social Interaction Social Interaction assist level: Interacts appropriately with others with medication or extra time (anti-anxiety, antidepressant).  Function - Problem Solving Problem solving assist level: Solves complex problems: Recognizes & self-corrects  Function - Memory Memory assist level: More than reasonable amount of time Patient normally able to recall (first 3 days only): Current season, Staff names and faces, That he or she is in a hospital, Location of own room  Medical Problem List and Plan: 1.  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 Recommendations of 30 day event monitor as outpatient              -CIR PT, OT, SLP-plan discharge today after PEG feeds are established  2.  DVT Prophylaxis/Anticoagulation: Subcutaneous heparin.--dc, encourage ambulation, on ASA and Plavix, now resumed post procedure 3. Pain Management: Tylenol as needed 4. Mood: Zoloft 50 mg daily 5. Neuropsych: This patient is capable of making decisions on her own behalf. 6. Skin/Wound Care: Routine skin checks 7. Fluids/Electrolytes/Nutrition: tolerating TF 8. Dysphagia. Patient with tube feeds   -  Repeat MBS 10/29- no improvement  -PEG 11/6                        9. Hypertension. Permissive hypertension. Monitor of increased mobility. Patient on Hyzaar 100-12 0.5 mg daily prior to admission. bp's controlled 11/8 Vitals:   11/18/16 1300 11/19/16 0500  BP: 124/72 131/66  Pulse: 99 95  Resp: 18 18  Temp: 98.1 F (36.7 C) (!) 97.4 F (36.3 C)  SpO2: 99% 97%   10. Hyperlipidemia. Lipitor   LOS (Days) 16 A FACE TO FACE EVALUATION WAS PERFORMED  KIRSTEINS,ANDREW E 11/19/2016, 8:26 AM

## 2016-11-19 NOTE — Discharge Instructions (Signed)
Inpatient Rehab Discharge Instructions  [name] Discharge date and time: No discharge date for patient encounter.   Activities/Precautions/ Functional Status: Activity: activity as tolerated Diet: JEVITY 240 five times daily  and free water 200 ml every 8 hour Wound Care: keep wound clean and dry Functional status:  ___ No restrictions     ___ Walk up steps independently ___ 24/7 supervision/assistance   ___ Walk up steps with assistance ___ Intermittent supervision/assistance  ___ Bathe/dress independently ___ Walk with walker     _x STROKE/TIA DISCHARGE INSTRUCTIONS SMOKING Cigarette smoking nearly doubles your risk of having a stroke & is the single most alterable risk factor  If you smoke or have smoked in the last 12 months, you are advised to quit smoking for your health.  Most of the excess cardiovascular risk related to smoking disappears within a year of stopping.  Ask you doctor about anti-smoking medications   Quit Line: 1-800-QUIT NOW  Free Smoking Cessation Classes [phone]  CHOLESTEROL Know your levels; limit fat & cholesterol in your diet  Lipid Panel     Component Value Date/Time   CHOL 206 (H) 10/29/2016 1206   TRIG 117 10/29/2016 1206   HDL 49 10/29/2016 1206   CHOLHDL 4.2 10/29/2016 1206   VLDL 23 10/29/2016 1206   LDLCALC 134 (H) 10/29/2016 1206      Many patients benefit from treatment even if their cholesterol is at goal.  Goal: Total Cholesterol (CHOL) less than 160  Goal:  Triglycerides (TRIG) less than 150  Goal:  HDL greater than 40  Goal:  LDL (LDLCALC) less than 100   BLOOD PRESSURE American Stroke Association blood pressure target is less that 120/80 mm/Hg  Your discharge blood pressure is:  BP: 131/67  Monitor your blood pressure  Limit your salt and alcohol intake  Many individuals will require more than one medication for high blood pressure  DIABETES (A1c is a blood sugar average for last 3 months) Goal HGBA1c is  under 7% (HBGA1c is blood sugar average for last 3 months)  Diabetes: No known diagnosis of diabetes    Lab Results  Component Value Date   HGBA1C 5.8 (H) 10/29/2016     Your HGBA1c can be lowered with medications, healthy diet, and exercise.  Check your blood sugar as directed by your physician  Call your physician if you experience unexplained or low blood sugars.  PHYSICAL ACTIVITY/REHABILITATION Goal is 30 minutes at least 4 days per week  Activity: Increase activity slowly, Therapies: Physical Therapy: Home Health Return to work:   Activity decreases your risk of heart attack and stroke and makes your heart stronger.  It helps control your weight and blood pressure; helps you relax and can improve your mood.  Participate in a regular exercise program.  Talk with your doctor about the best form of exercise for you (dancing, walking, swimming, cycling).  DIET/WEIGHT Goal is to maintain a healthy weight  Your discharge diet is: Diet NPO time specified  liquids Your height is:    Your current weight is: Weight: 57.1 kg (125 lb 14.1 oz) Your Body Mass Index (BMI) is:  BMI (Calculated): 24.58  Following the type of diet specifically designed for you will help prevent another stroke.  Your goal weight range is:    Your goal Body Mass Index (BMI) is 19-24.  Healthy food habits can help reduce 3 risk factors for stroke:  High cholesterol, hypertension, and excess weight.  RESOURCES Stroke/Support Group:  Call  [phone]   STROKE EDUCATION PROVIDED/REVIEWED AND GIVEN TO PATIENT Stroke warning signs and symptoms How to activate emergency medical system (call 911). Medications prescribed at discharge. Need for follow-up after discharge. Personal risk factors for stroke. Pneumonia vaccine given:  Flu vaccine given:  My questions have been answered, the writing is legible, and I understand these instructions.  I will adhere to these goals & educational materials that have been  provided to me after my discharge from the hospital.   __ Bathe/dress with assistance ___ Walk Independently    ___ Shower independently ___ Walk with assistance    ___ Shower with assistance ___ No alcohol     ___ Return to work/school ________  Special Instructions: Plan for 30 day cardiac event monitor   COMMUNITY REFERRALS UPON DISCHARGE:    Home Health:   PT, OT, SP,RN   Agency:[facility]   Phone:[phone]   Date of last service:11/19/2016  Medical Equipment/Items Ordered:HOSPITAL BED, YONKERS SUCTION MACHINE AND JEVITY 1.5 300 ML 4X DAILY  Agency/Supplier:[facility]   [phone]   GENERAL COMMUNITY RESOURCES FOR PATIENT/FAMILY: Support Groups:[facility] SUPPORT GROUP EVERY SECOND Thursday @ 3:00-4:00 PM ON THE REHAB UNIT QUESTIONS CONTACT CAITLIN [phone]  My questions have been answered and I understand these instructions. I will adhere to these goals and the provided educational materials after my discharge from the hospital.  Patient/Caregiver Signature _______________________________ Date __________  Clinician Signature _______________________________________ Date __________  Please bring this form and your medication list with you to all your follow-up doctor's appointments.

## 2016-11-19 NOTE — Progress Notes (Signed)
Social Work  Discharge Note  The overall goal for the admission was met for:   Discharge location: Yes-GOING TO [place]  Length of Stay: Yes-16 DAYS  Discharge activity level: Yes-MOD/I LEVEL-SUPERVISION LEVEL  Home/community participation: Yes  Services provided included: MD, RD, PT, OT, SLP, RN, CM, Pharmacy and SW  Financial Services: Private Insurance: [facility]  Follow-up services arranged: Home Health: [facility]-PT,OT,SP,RN, DME: [facility]-HOSPITAL BED, YONKERS SUNCTION MACHINE AND JEVITY 1.5 TUBE FEEDS and Patient/Family has no preference for HH/DME agencies  Comments (or additional information):JASON AND WIFE IN FOR EDUCATION AND WENT WELL. PT GOING TO THEIR HOME UNTIL ABLE TO RETURN TO HER'S. CAN DO OWN TUBES FEEDINGS AND WATER FLUSHES  Patient/Family verbalized understanding of follow-up arrangements: Yes  Individual responsible for coordination of the follow-up plan: SELF & JASON-SON  Confirmed correct DME delivered: Elease Hashimoto 11/19/2016    Elease Hashimoto

## 2016-11-25 ENCOUNTER — Telehealth: Payer: Self-pay

## 2016-11-25 NOTE — Telephone Encounter (Signed)
Amy McGee speech therapist is following up about patients modified barium swallow study. She is wondering if this has been set up or scheduled and if there is anything that the patient needs to do.

## 2016-11-26 ENCOUNTER — Telehealth: Payer: Self-pay | Admitting: *Deleted

## 2016-11-26 NOTE — Telephone Encounter (Signed)
We will set this up when the patient sees me back in clinic which will be in less than 2 weeks

## 2016-11-26 NOTE — Telephone Encounter (Signed)
Amy Mila PalmerMcGee, ST, [facility] left a message stating that initial eval was completed and was seeking verbal orders to initiate therapy. I contacted speech therapist and gave verbal orders per office protocol. ALSO, speech therapist reports that the patient has an 'irregular' irregular heart beat.  She is asking for advice on what to tell the patient. Discharge note does not indicate follow up with cardio

## 2016-11-26 NOTE — Telephone Encounter (Signed)
Message relayed to Amy 

## 2016-11-26 NOTE — Telephone Encounter (Signed)
Message relayed to Amy

## 2016-11-26 NOTE — Telephone Encounter (Signed)
Pt will need to see PCP for any cardiac concerns

## 2016-12-15 ENCOUNTER — Ambulatory Visit: Payer: Medicare Other | Admitting: Physical Medicine & Rehabilitation

## 2016-12-15 ENCOUNTER — Encounter: Payer: Medicare Other | Attending: Physical Medicine & Rehabilitation

## 2016-12-15 ENCOUNTER — Encounter: Payer: Self-pay | Admitting: Physical Medicine & Rehabilitation

## 2016-12-15 VITALS — BP 150/76 | HR 125

## 2016-12-15 DIAGNOSIS — I639 Cerebral infarction, unspecified: Secondary | ICD-10-CM

## 2016-12-15 DIAGNOSIS — I69391 Dysphagia following cerebral infarction: Secondary | ICD-10-CM | POA: Diagnosis not present

## 2016-12-15 DIAGNOSIS — Z8673 Personal history of transient ischemic attack (TIA), and cerebral infarction without residual deficits: Secondary | ICD-10-CM

## 2016-12-15 DIAGNOSIS — E785 Hyperlipidemia, unspecified: Secondary | ICD-10-CM | POA: Insufficient documentation

## 2016-12-15 NOTE — Patient Instructions (Signed)
Referral to outpatient therapy PT, OT, Speech

## 2016-12-15 NOTE — Progress Notes (Signed)
Subjective:    Patient ID: [name], female    DOB: 05/22/1940, 76 y.o.   MRN: [id number] 76 year old right-handed female with history of hypertension who lives alone, independent prior to admission.  Presented on October 28, 2016, after syncopal episode.  She became dizzy, had episode of vomiting.  Cranial CT scan negative.  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  MRI showed multiple brain stem and cerebellar infarctions.  No acute hemorrhage. Troponin negative.  Hypokalemia, 2.6, with supplement added. Echocardiogram with ejection fraction of 60%, grade 1 diastolic dysfunction.  The patient did initially require intubation, extubated on October 29, 2016.    HPI    Patient here for hospital follow-up after stroke [facility].  She was on my stroke rehabilitation service.  Required G-tube placement secondary to inability to progress beyond n.p.o. status with speech therapy.  She has returned to home where she is modified independent with a walker.  She still has problems with her balance but has not fallen.  Able to do her own TFs and meds via G tube  Bowels are moving, no bladder complaints  Patient has had a couple dizzy spells associated with elevated heart rate.  Message from physical therapy about heart rate resting about 103 and with exercise 120-130. 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    Pain Inventory Average Pain 0 Pain Right Now 0 My pain is na  In the last 24 hours, has pain interfered with the following? General activity 0 Relation with others 0 Enjoyment of life 0 What TIME of day is your pain at its worst? na Sleep (in general) Fair  Pain is  worse with: na Pain improves with: na Relief from Meds: na  Mobility use a walker ability to climb steps?  yes do you drive?  no  Function retired  Neuro/Psych No problems in this area  Prior Studies Any changes since last visit?  no  Physicians involved in your care Any changes since last visit?  no   No family history on file. Social History   Socioeconomic History  . Marital status: Single    Spouse name: Not on file  . Number of children: Not on file  . Years of education: Not on file  . Highest education level: Not on file  Social Needs  . Financial resource strain: Not on file  . Food insecurity - worry: Not on file  . Food insecurity - inability: Not on file  . Transportation needs - medical: Not on file  . Transportation needs - non-medical: Not on file  Occupational History  . Not on file  Tobacco Use  . Smoking status: Never Smoker  . Smokeless tobacco: Never Used  Substance and Sexual Activity  . Alcohol use: No  . Drug use: No  . Sexual activity: No  Other Topics Concern  . Not on file  Social History Narrative   ** Merged History Encounter **       Past Surgical History:  Procedure Laterality Date  . BACK SURGERY    . CHOLECYSTECTOMY  2006  . IR GASTROSTOMY TUBE MOD SED  11/17/2016   Past Medical History:  Diagnosis Date  . Arthritis   .  Hypertension    There were no vitals taken for this visit.  Opioid Risk Score:   Fall Risk Score:  `1  Depression screen PHQ 2/9  No flowsheet data found.   Review of Systems  Constitutional: Negative.   HENT: Negative.   Eyes: Negative.   Respiratory: Negative.   Cardiovascular: Negative.   Gastrointestinal: Negative.   Endocrine: Negative.   Genitourinary: Negative.   Musculoskeletal: Negative.   Skin: Negative.   Allergic/Immunologic: Negative.   Neurological: Negative.   Hematological: Negative.   Psychiatric/Behavioral: Negative.   All other systems reviewed and are  negative.      Objective:   Physical Exam  Constitutional: She is oriented to person, place, and time. She appears well-developed and well-nourished.  HENT:  Head: Normocephalic and atraumatic.  Eyes: Conjunctivae and EOM are normal. Pupils are equal, round, and reactive to light.  Neck: Normal range of motion.  Cardiovascular: Regular rhythm, S1 normal and S2 normal. Tachycardia present. Exam reveals no gallop.  No murmur heard. Pulmonary/Chest: Effort normal and breath sounds normal. No respiratory distress. She has no wheezes.  Abdominal: Soft. Bowel sounds are normal. She exhibits no distension. There is no tenderness.  G-tube site clean dry and intact  Neurological: She is alert and oriented to person, place, and time. No sensory deficit. Gait abnormal.  Positive Romberg Ambulates with a walker wide basis support no evidence of toe drag or knee instability No evidence of dysmetria with finger-nose-finger testing or heel to shin testing Sensation intact light touch bilateral upper and lower limbs  Skin: Skin is warm and dry.  Psychiatric: She has a normal mood and affect.  Nursing note and vitals reviewed.          Assessment & Plan:  1.  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 Overall she is recovering well however her swallowing function has been more affected than expected.  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 Recommendations Repeat modified barium swallow Referral to outpatient PT OT speech Patient request she would like to have a primary physician through [facility].  [facility]  Physical medicine and rehabilitation follow-up in 3-4 weeks

## 2016-12-17 ENCOUNTER — Telehealth: Payer: Self-pay

## 2016-12-17 MED ORDER — SERTRALINE HCL 50 MG PO TABS: 50.0000 mg | ORAL_TABLET | Freq: Every day | ORAL | 0 refills | Status: DC

## 2016-12-17 MED ORDER — CLOPIDOGREL BISULFATE 75 MG PO TABS: 75.0000 mg | ORAL_TABLET | Freq: Every day | ORAL | 0 refills | Status: DC

## 2016-12-17 MED ORDER — ATORVASTATIN CALCIUM 40 MG PO TABS: 40.0000 mg | ORAL_TABLET | Freq: Every day | ORAL | 0 refills | Status: DC

## 2016-12-17 NOTE — Telephone Encounter (Signed)
Called in refill  

## 2016-12-23 ENCOUNTER — Other Ambulatory Visit: Payer: Self-pay | Admitting: Physical Medicine & Rehabilitation

## 2016-12-23 ENCOUNTER — Other Ambulatory Visit (HOSPITAL_COMMUNITY): Payer: Self-pay | Admitting: Physical Medicine & Rehabilitation

## 2016-12-23 ENCOUNTER — Telehealth: Payer: Self-pay | Admitting: *Deleted

## 2016-12-23 DIAGNOSIS — I69322 Dysarthria following cerebral infarction: Secondary | ICD-10-CM

## 2016-12-23 DIAGNOSIS — R1310 Dysphagia, unspecified: Secondary | ICD-10-CM

## 2016-12-23 NOTE — Telephone Encounter (Signed)
Given number to outpt neuro rehab but Ms [name] says they say it needs to be done in acute rehab.  New order placed for MBS .

## 2016-12-24 NOTE — Telephone Encounter (Signed)
Recieved electronic medication refill requests for atorvastatin, clopidogrel, and sertraline.   Previous note mentioned patient is being referred to a new family practice physician.  Did not mention continuing these medications thou, please advise

## 2016-12-24 NOTE — Telephone Encounter (Signed)
I am ok with ordring a one month supply of meds but she needs to schedule with  PCP to order 90d supply.

## 2016-12-24 NOTE — Telephone Encounter (Signed)
Medications refilled through [facility] for 1 month, all further refills to PCP

## 2016-12-25 DIAGNOSIS — E785 Hyperlipidemia, unspecified: Secondary | ICD-10-CM | POA: Diagnosis not present

## 2016-12-25 DIAGNOSIS — I69391 Dysphagia following cerebral infarction: Secondary | ICD-10-CM | POA: Diagnosis not present

## 2016-12-25 DIAGNOSIS — I10 Essential (primary) hypertension: Secondary | ICD-10-CM | POA: Diagnosis not present

## 2016-12-25 DIAGNOSIS — Z7902 Long term (current) use of antithrombotics/antiplatelets: Secondary | ICD-10-CM | POA: Diagnosis not present

## 2016-12-25 DIAGNOSIS — I69354 Hemiplegia and hemiparesis following cerebral infarction affecting left non-dominant side: Secondary | ICD-10-CM | POA: Diagnosis not present

## 2016-12-25 DIAGNOSIS — Z431 Encounter for attention to gastrostomy: Secondary | ICD-10-CM | POA: Diagnosis not present

## 2016-12-25 DIAGNOSIS — R1310 Dysphagia, unspecified: Secondary | ICD-10-CM | POA: Diagnosis not present

## 2016-12-25 DIAGNOSIS — Z7982 Long term (current) use of aspirin: Secondary | ICD-10-CM | POA: Diagnosis not present

## 2016-12-30 ENCOUNTER — Ambulatory Visit: Payer: Medicare Other | Admitting: Rehabilitative and Restorative Service Providers"

## 2016-12-30 ENCOUNTER — Encounter: Payer: Self-pay | Admitting: Occupational Therapy

## 2016-12-30 ENCOUNTER — Ambulatory Visit: Payer: Medicare Other

## 2016-12-30 ENCOUNTER — Other Ambulatory Visit: Payer: Self-pay

## 2016-12-30 ENCOUNTER — Ambulatory Visit: Payer: Medicare Other | Attending: Physical Medicine & Rehabilitation | Admitting: Occupational Therapy

## 2016-12-30 ENCOUNTER — Encounter: Payer: Self-pay | Admitting: Rehabilitative and Restorative Service Providers"

## 2016-12-30 DIAGNOSIS — R29818 Other symptoms and signs involving the nervous system: Secondary | ICD-10-CM

## 2016-12-30 DIAGNOSIS — M6281 Muscle weakness (generalized): Secondary | ICD-10-CM | POA: Insufficient documentation

## 2016-12-30 DIAGNOSIS — R1312 Dysphagia, oropharyngeal phase: Secondary | ICD-10-CM | POA: Insufficient documentation

## 2016-12-30 DIAGNOSIS — R2689 Other abnormalities of gait and mobility: Secondary | ICD-10-CM | POA: Insufficient documentation

## 2016-12-30 DIAGNOSIS — R2681 Unsteadiness on feet: Secondary | ICD-10-CM

## 2016-12-30 NOTE — Patient Instructions (Signed)
Complete your exercises they provided you with on rehab. Please bring that sheet, and your expiratory muscle trainer next session. We will go over the precautions from your swallow test as well as have you perform your exercises.

## 2016-12-30 NOTE — Therapy (Signed)
[facility] [address] [place], [place], [address] Phone: [phone]   Fax:  [phone]  Speech Language Pathology Evaluation  Patient Details  Name: [name] MRN: [id number] Date of Birth: 07/09/1940 Referring Provider: Claudette LawsKirsteins, Andrew, MD   Encounter Date: 12/30/2016  End of Session - 12/30/16 2231    Visit Number  1    Number of Visits  17    Date for SLP Re-Evaluation  03/05/17    SLP Start Time  1318    SLP Stop Time   1400    SLP Time Calculation (min)  42 min    Activity Tolerance  Patient tolerated treatment well       Past Medical History:  Diagnosis Date  . Arthritis   . Hypertension     Past Surgical History:  Procedure Laterality Date  . BACK SURGERY    . CHOLECYSTECTOMY  2006  . IR GASTROSTOMY TUBE MOD SED  11/17/2016    There were no vitals filed for this visit.  Subjective Assessment - 12/30/16 1338    Subjective  Pt arrives NPO, with ice chips after thorough oral care. Pt did not bring toothbrush/paste with her to ST eval today. Appears to manage secretions very well.    Currently in Pain?  No/denies         SLP Evaluation [facility] - 12/30/16 2228      SLP Visit Information   SLP Received On  12/30/16    Referring Provider  Claudette LawsKirsteins, Andrew, MD    Onset Date  10-28-16    Medical Diagnosis  CVA      General Information   HPI  Pt with CVA 10-28-16 requiring G-tube insertion due to severe dysphagia. Pt has had ST on CIR and HHST. Pt has established HEP already. Follow up modified (MBSS) scheduled tomorrow.      Prior Functional Status   Cognitive/Linguistic Baseline  Within functional limits    Type of Home  House     Lives With  Alone    Vocation  Retired      IT consultantCognition   Overall Cognitive Status  Within Functional Limits for tasks assessed      Auditory Comprehension   Overall Auditory Comprehension  Appears within functional limits for tasks assessed      Verbal  Expression   Overall Verbal Expression  Appears within functional limits for tasks assessed      Oral Motor/Sensory Function   Overall Oral Motor/Sensory Function  Appears within functional limits for tasks assessed    Velum  Within Functional Limits      Motor Speech   Overall Motor Speech  Appears within functional limits for tasks assessed       Pt currently tolerates ice chips following thorough oral care. . POs: Due to pt not performing oral care prior to today's eval, no POs were attempted except for approx 8-10 drops of H2O for pt to use during demonstration of her HEP to SLP.   SLP then inquired of pt what exercises were on her HEP. She reported Masako, effortful swallow, open mouth (10 second hold), and use of an expiratory muscle trainer. Lastly she was performing effortful swallows with ice chips following thorough oral care. SLP performed each exercise and pt return demonstrated each exercise. Her biggest difficulty was with Masako. SLP ensured pt performance was correct prior to moving on to next exercise. Pt was instructed to complete this program 2-3 times a day.  SLP Education - January 28, 2017 2230    Education provided  Yes    Education Details  eval results, possible goals, likely decr to x1/week when following precautions (PRN), and modified independent with HEP, MAsako    Person(s) Educated  Patient    Methods  Explanation;Demonstration;Verbal cues    Comprehension  Verbalized understanding;Returned demonstration;Verbal cues required       SLP Short Term Goals - Jan 28, 2017 2236      SLP SHORT TERM GOAL #1   Title  pt will complete HEP with modified independence over 3 sessions    Time  4    Period  Weeks    Status  New      SLP SHORT TERM GOAL #2   Title  pt will use any recommended safe swallow strategies following modified barium swallow (MBSS) during 3 ST sessions    Time  4    Period  Weeks    Status  New      SLP SHORT TERM GOAL #3    Title  pt will thicken liqiuds to prescribed consistency with modified independence, PRN following MBSS    Time  4    Period  Weeks    Status  New       SLP Long Term Goals - 01/28/17 2238      SLP LONG TERM GOAL #1   Title  pt will perform swallow HEP correctly with modified independence over 5 sessions    Time  8    Period  Weeks    Status  New      SLP LONG TERM GOAL #2   Title  pt will adhere to any safe swallow strategies with POs during 5 ST sessions with modified independence    Time  8    Period  Weeks    Status  New      SLP LONG TERM GOAL #3   Title  pt will thicken liquids (PRN) to prescrbed consistency with modified independence over 3 sessions    Time  8    Period  Weeks    Status  New       Plan - 28-Jan-2017 2232    Clinical Impression Statement  Pt presents today with severe dysphagia as ID'd by modified barium swallow assessment (MBSS) 11-09-16, when cont'd NPO recommended. Pt has had ice chips after thorough oral care since that time and can now manage secretions to the point she does not have to use suction(last 6 weeks). Additionally she notes stronger swallow response than when she returned home from rehab.  Pt with follow up MBSS tomorrow. Skilled ST is necessary for assiting pt in improving her swallowing skills by HEP assessment as well as teaching/ensuring any possible precautions are used correctly with recommended POs.following MBSS.     Speech Therapy Frequency  2x / week    Duration  -- 8 weeks    Treatment/Interventions  Aspiration precaution training;Pharyngeal strengthening exercises;Diet toleration management by SLP;Trials of upgraded texture/liquids;Internal/external aids;Patient/family education;Compensatory strategies;SLP instruction and feedback;Oral motor exercises;Cueing hierarchy;Environmental controls;NMES any or all may be used    Potential to Achieve Goals  Good       Patient will benefit from skilled therapeutic intervention in order to  improve the following deficits and impairments:   Dysphagia, oropharyngeal phase  G-Codes - 2017/01/28 2240    Functional Assessment Tool Used  noms    Functional Limitations  Swallowing    Swallow Current Status (Z6109)  At least 80 percent but less  than 100 percent impaired, limited or restricted    Swallow Goal Status [phone](G8997)  At least 40 percent but less than 60 percent impaired, limited or restricted       Problem List Patient Active Problem List   Diagnosis Date Noted  . Cerebellar infarction (HCC) 11/03/2016  . Acute ischemic stroke (HCC)   . Benign essential HTN   . Dysarthria, post-stroke   . Leukocytosis   . Acute blood loss anemia   . 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 Acute encephalopathy 10/28/2016  . 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 INSECT BITE 07/04/2009  . HYPERLIPIDEMIA 06/03/2007  . ALLERGIC RHINITIS 06/03/2007  . VAGINITIS, ATROPHIC 06/03/2007  . CONTACT DERMATITIS&OTH ECZEMA DUE OTH SPEC AGENT 06/03/2007  . COLONIC POLYPS, HX OF 06/03/2007    [facility] HospitalCHINKE,CARL ,MS, CCC-SLP  12/30/2016, 10:41 PM  [facility] [address] [place], [place], [address] Phone: [phone]   Fax:  [phone]  Name: [name] MRN: [id number] Date of Birth: 09/30/1940

## 2016-12-30 NOTE — Therapy (Signed)
[facility] [address] [place], [place], [address] Phone: [phone]   Fax:  [phone]  Physical Therapy Evaluation  Patient Details  Name: [name] MRN: [id number] Date of Birth: 07/31/1940 Referring Provider: Donell Sievert, MD   Encounter Date: 12/30/2016  PT End of Session - 12/30/16 1326    Visit Number  1    Number of Visits  17 eval + 16 visits    Date for PT Re-Evaluation  02/28/17    Authorization Type  G code every 10th visit    PT Start Time  1235    PT Stop Time  1318    PT Time Calculation (min)  43 min    Equipment Utilized During Treatment  Gait belt    Activity Tolerance  Patient tolerated treatment well    Behavior During Therapy  WFL for tasks assessed/performed       Past Medical History:  Diagnosis Date  . Arthritis   . Hypertension     Past Surgical History:  Procedure Laterality Date  . BACK SURGERY    . CHOLECYSTECTOMY  2006  . IR GASTROSTOMY TUBE MOD SED  11/17/2016    There were no vitals filed for this visit.   Subjective Assessment - 12/30/16 1241    Subjective  The patient is s/p CVA 10/28/16 (cerebellar infarction with L vertebral artery occlusion).  The patient went to IP rehab and d/c to her son's home 11/19/16, and had HH PT/OT/ST.  She returned to her home environment yesterday and is living alone.  She is using rollater RW for outdoors and is using no device in home.  She feels llimited in walking and balance.  She notes her right side is slightly weaker than the left side.    Patient is accompained by:  -- friend drove her to therapy today    Patient Stated Goals  "Walk on my own" without rolling walker outdoors.    Currently in Pain?  No/denies         [facility] PT Assessment - 12/30/16 1246      Assessment   Medical Diagnosis  CVA/ cerebellar infarction    Referring Provider  Donell Sievert, MD    Onset Date/Surgical Date  10/28/16    Prior Therapy  IP  rehab and home health.      Precautions   Precautions  Fall    Precaution Comments  *had back surgery in 05/2016*      Restrictions   Weight Bearing Restrictions  No      Balance Screen   Has the patient fallen in the past 6 months  No    Has the patient had a decrease in activity level because of a fear of falling?   Yes due to falls    Is the patient reluctant to leave their home because of a fear of falling?   Yes recently returned home      Home Environment   Living Environment  Private residence    Type of Home  -- townhome    Home Access  Stairs to enter    Entrance Stairs-Number of Steps  1    Entrance Stairs-Rails  None    Home Layout  One level    Home Equipment  Walker - 4 wheels;Shower seat      Prior Function   Level of Independence  Independent    Vocation  Retired      Observation/Other Assessments   Focus on  Therapeutic Outcomes (FOTO)   50%      Sensation   Light Touch  Appears Intact    Additional Comments  Does note some diminished sensation in feet from prior to back surgery.      ROM / Strength   AROM / PROM / Strength  AROM;Strength      AROM   Overall AROM   Within functional limits for tasks performed    Overall AROM Comments  ROM in neck is limited bilaterally, worse to the left side.      Strength   Overall Strength Comments  4/5 left shoulder flexion/abduction; 4/5 left elbow flexion; 3+/5 right shoulder flexion/abduction; 5/5 right elbow flexion/extension.;  bilateral hip flexion and knee flexion/extension is 5/5.  Right ankle DF 3+/5, Left ankle DF 4+/5 (*may be related to prior back surgery).      Ambulation/Gait   Ambulation/Gait  Yes    Ambulation/Gait Assistance  4: Min guard;6: Modified independent (Device/Increase time)    Ambulation Distance (Feet)  100 Feet    Assistive device  Rollator;None    Gait Pattern  Wide base of support slowed timing left side with exaggerated heel strike;    Ambulation Surface  Level;Indoor    Gait  velocity  2.38 ft/sec    Stairs  Yes    Stairs Assistance  5: Supervision    Stairs Assistance Details (indicate cue type and reason)  patient exaggerated R foot clearance on stairs? possibly from prior back issues    Stair Management Technique  Two rails;Alternating pattern    Number of Stairs  4    Gait Comments  Modified indep with rollater RW and CGA without device with loss of balance needing min A to recover.       Standardized Balance Assessment   Standardized Balance Assessment  Berg Balance Test;Timed Up and Go Test      Berg Balance Test   Sit to Stand  Able to stand without using hands and stabilize independently    Standing Unsupported  Able to stand safely 2 minutes    Sitting with Back Unsupported but Feet Supported on Floor or Stool  Able to sit safely and securely 2 minutes    Stand to Sit  Sits safely with minimal use of hands    Transfers  Able to transfer safely, minor use of hands    Standing Unsupported with Eyes Closed  Able to stand 10 seconds with supervision    Standing Ubsupported with Feet Together  Able to place feet together independently and stand for 1 minute with supervision    From Standing, Reach Forward with Outstretched Arm  Can reach forward >12 cm safely (5")    From Standing Position, Pick up Object from Floor  Able to pick up shoe, needs supervision    From Standing Position, Turn to Look Behind Over each Shoulder  Needs supervision when turning    Turn 360 Degrees  Able to turn 360 degrees safely but slowly    Standing Unsupported, Alternately Place Feet on Step/Stool  Able to complete 4 steps without aid or supervision    Standing Unsupported, One Foot in Front  Able to take small step independently and hold 30 seconds    Standing on One Leg  Unable to try or needs assist to prevent fall    Total Score  39    Berg comment:  39/56 indicating high fall risk.      Timed Up and Go Test   TUG  --  19.91 seconds without device    TUG Comments  On  first attempt, patient had loss of balanace when turning to the left and required min A to recover.  Repeated and encouraged typical pace as she felt she was moving faster than normal.             Objective measurements completed on examination: See above findings.      [facility] Adult PT Treatment/Exercise - 12/30/16 1246      Self-Care   Self-Care  Other Self-Care Comments    Other Self-Care Comments   Discussed current home exercise program with HH (knee bends x 25, heel/toe raises x 25, hip abduction x 25 reps, knee flexion x 25).             PT Education - 12/30/16 1325    Education provided  Yes    Education Details  Discussed home health physical therapy HEP and recommended continuing.  Discussed use of Rollater RW     Person(s) Educated  Patient    Methods  Explanation    Comprehension  Verbalized understanding       PT Short Term Goals - 12/30/16 1330      PT SHORT TERM GOAL #1   Title  The patient will be indep with HEP for balance and general mobility.    Time  4    Period  Weeks    Target Date  01/29/17      PT SHORT TERM GOAL #2   Title  The patient will improve gait speed from 2.38 ft/sec to > or equal to 2.62 ft/sec to demo transition to "full community ambulator" classification of gait.    Time  4    Period  Weeks    Target Date  01/29/17      PT SHORT TERM GOAL #3   Title  The patient will improve TUG from 19.91 seconds to < or equal to 16 seconds to demo improving functional mobility.    Time  4    Period  Weeks    Target Date  01/29/17      PT SHORT TERM GOAL #4   Title  The patient will improve Berg from 39/56 to > or equal to 44/56 to demo dec'ing risk for falls.    Time  4    Period  Weeks    Target Date  01/29/17      PT SHORT TERM GOAL #5   Title  The patient will ambulate x 250 ft in the clinic without loss of balance to demo improved safety for household ambulation.    Time  4    Period  Weeks    Target Date  01/29/17         PT Long Term Goals - 12/30/16 1332      PT LONG TERM GOAL #1   Title  The patient will be indep with progression of HEP.    Time  8    Period  Weeks    Target Date  02/28/17      PT LONG TERM GOAL #2   Title  The patient will improve Berg from 39/56 to > or equal than 46/56 to demo dec'd fall risk.    Time  8    Period  Weeks    Target Date  02/28/17      PT LONG TERM GOAL #3   Title  The patient will improve TUG from 19.91 seconds to < or equal to 14  seconds to demo dec'ing risk for falls.    Time  8    Period  Weeks    Target Date  02/28/17      PT LONG TERM GOAL #4   Title  The patinet will improve gait speed from 2.38 ft/sec to > or equal to 3.0 ft/sec to demo improving mobility.    Time  8    Period  Weeks    Target Date  02/28/17      PT LONG TERM GOAL #5   Title  The patient will improve functional status survey from 50% to > or equal to 62% to demo improving functional mobility.    Time  8    Period  Weeks    Target Date  02/28/17             Plan - 01-11-17 1335    Clinical Impression Statement  The patient is a 76 year old female s/p CVA on 10/28/16.  She also is s/p back surgery in 05/2016. The patient presents with mild weakness noted in right ankle, UE weakness (to be further addressed by OT) decreased balance with fall risk per Berg and TUG, decreased gait speed and dec'd dynamic gait.  PT to address deficits to optimize functional status and return to prior functional level.     History and Personal Factors relevant to plan of care:  CVA, cerebellar infarction    Clinical Presentation  Stable    Clinical Presentation due to:  recently returned home to live mod indep    Clinical Decision Making  Low    Rehab Potential  Good    PT Frequency  2x / week +eval    PT Duration  8 weeks    PT Treatment/Interventions  ADLs/Self Care Home Management;Balance training;Neuromuscular re-education;Patient/family education;Gait training;Stair training;Functional  mobility training;Therapeutic activities;Therapeutic exercise;Manual techniques    PT Next Visit Plan  Establish balance HEP (has strengthening HEP from home health):  corner with eyes closed, head motion with narrowing base of support.  Gait, dynamic gait and safety with turns.    Consulted and Agree with Plan of Care  Patient       Patient will benefit from skilled therapeutic intervention in order to improve the following deficits and impairments:  Abnormal gait, Decreased balance, Decreased strength, Difficulty walking  Visit Diagnosis: Other abnormalities of gait and mobility - Plan: PT plan of care cert/re-cert  Unsteadiness on feet - Plan: PT plan of care cert/re-cert  Other symptoms and signs involving the nervous system - Plan: PT plan of care cert/re-cert  G-Codes - 2017-01-11 1418    Functional Assessment Tool Used (Outpatient Only)  Berg=39/56    Functional Limitation  Mobility: Walking and moving around    Mobility: Walking and Moving Around Current Status (Z6109)  At least 20 percent but less than 40 percent impaired, limited or restricted    Mobility: Walking and Moving Around Goal Status (U0454)  At least 1 percent but less than 20 percent impaired, limited or restricted        Problem List Patient Active Problem List   Diagnosis Date Noted  . Cerebellar infarction (HCC) 11/03/2016  . Acute ischemic stroke (HCC)   . Benign essential HTN   . Dysarthria, post-stroke   . Leukocytosis   . Acute blood loss anemia   . 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 Acute encephalopathy 10/28/2016  . 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 INSECT BITE  07/04/2009  . HYPERLIPIDEMIA 06/03/2007  . ALLERGIC RHINITIS 06/03/2007  . VAGINITIS, ATROPHIC 06/03/2007  . CONTACT DERMATITIS&OTH ECZEMA DUE OTH SPEC AGENT 06/03/2007  . COLONIC POLYPS, HX OF 06/03/2007    Kiyana Vazguez, PT 12/30/2016, 2:24  PM  [facility] [address] [place], [place], [address] Phone: [phone]   Fax:  [phone]  Name: Nolon Nationsnna K Burgner MRN: [id number] Date of Birth: 08/20/1940

## 2016-12-30 NOTE — Therapy (Signed)
[facility] [address] [place], [place], [address] Phone: [phone]   Fax:  [phone]  Occupational Therapy Evaluation  Patient Details  Name: Donna Nationsnna K Fluty MRN: [id number] Date of Birth: 11/20/1940 No Data Recorded  Encounter Date: 12/30/2016  OT End of Session - 12/30/16 1526    Visit Number  1    Number of Visits  1    Authorization Type  [employer] Medicare    OT Start Time  1420    OT Stop Time  1505    OT Time Calculation (min)  45 min    Activity Tolerance  Patient tolerated treatment well    Behavior During Therapy  [facility] for tasks assessed/performed       Past Medical History:  Diagnosis Date  . Arthritis   . Hypertension     Past Surgical History:  Procedure Laterality Date  . BACK SURGERY    . CHOLECYSTECTOMY  2006  . IR GASTROSTOMY TUBE MOD SED  11/17/2016    There were no vitals filed for this visit.  Subjective Assessment - 12/30/16 1430    Subjective   I have not been cleared    Pertinent History  Hypertension, recent back surgery    Currently in Pain?  No/denies    Pain Score  0-No pain        [facility] OT Assessment - 12/30/16 1436      Assessment   Medical Diagnosis  CVA/ cerebellar infarction    Referring Provider  Claudette LawsAndrew Kirsteins    Onset Date/Surgical Date  10/28/16    Prior Therapy  [facility] 10/23-11/8, HH      Precautions   Precautions  Fall    Precaution Comments  *had back surgery in 05/2016*      Restrictions   Weight Bearing Restrictions  No      Balance Screen   Has the patient fallen in the past 6 months  No      Prior Function   Level of Independence  Independent with basic ADLs;Independent with household mobility without device;Independent with community mobility without device;Independent with homemaking with ambulation    Vocation Requirements  cleaned houses    Leisure  [employer], friends, out to eat, tv, crosswords, knit, cross stitch      ADL   Eating/Feeding  NPO ice chips - after oral care    Grooming  Independent    Upper Body Bathing  Modified independent    Lower Body Bathing  Modified independent    Upper Body Dressing  Independent    Lower Body Dressing  Modified independent    Toilet Transfer  Modified independent    Toileting - Clothing Manipulation  Modified independent    PsychiatristTub/Shower Transfer  Modified independent    Tub/Shower Transfer Equipment  Shower seat with back;Grab bars    Transfers/Ambulation Related to ADL's  walks with rolling walker      IADL   Prior Level of Function Shopping  independent    Shopping  Needs to be accompanied on any shopping trip;Assistance for transportation    Prior Level of Function Light Housekeeping  independent    Light Housekeeping  Performs light daily tasks but cannot maintain acceptable level of cleanliness    Prior Level of Function Meal Prep  independent    Prior Level of Function Community Mobility  independent    Community Mobility  Relies on family or friends for transportation    Prior Level of  Function Medication Managment  independent    Medication Management  Is responsible for taking medication in correct dosages at correct time    Prior Level of Function Financial Management  independent    Financial Management  Manages financial matters independently (budgets, writes checks, pays rent, bills goes to bank), collects and keeps track of income      Written Expression   Dominant Hand  Right    Handwriting  100% legible      Vision - History   Baseline Vision  Wears contact      Vision Assessment   Eye Alignment  Within Functional Limits      Activity Tolerance   Activity Tolerance  Endurance does not limit participation in activity    Activity Tolerance Comments  Patient reports improving energy level      Cognition   Overall Cognitive Status  Within Functional Limits for tasks assessed    Cognition Comments  No deficits detected.         Observation/Other Assessments   Observations  Skilled clinical judgement      Posture/Postural Control   Posture/Postural Control  No significant limitations      Sensation   Light Touch  Appears Intact      Coordination   Gross Motor Movements are Fluid and Coordinated  No    Fine Motor Movements are Fluid and Coordinated  Yes    9 Hole Peg Test  Right;Left    Right 9 Hole Peg Test  37.62    Left 9 Hole Peg Test  29.75      Perception   Perception  Within Functional Limits      Praxis   Praxis  Intact      ROM / Strength   AROM / PROM / Strength  AROM;Strength      AROM   Overall AROM   Within functional limits for tasks performed    Overall AROM Comments  slight limitation in right shoulder from prior injury      Strength   Overall Strength Comments  4/5 left shoulder flexion/abduction; 4/5 left elbow flexion; 3+/5 right shoulder flexion/abduction; 5/5 right elbow flexion/extension.;  bilateral hip flexion and knee flexion/extension is 5/5.  Right ankle DF 3+/5, Left ankle DF 4+/5 (*may be related to prior back surgery).      Hand Function   Right Hand Gross Grasp  Impaired    Right Hand Grip (lbs)  42    Right Hand Lateral Pinch  12 lbs    Left Hand Gross Grasp  Impaired    Left Hand Grip (lbs)  38    Left Hand Lateral Pinch  12 lbs                      OT Education - 12/30/16 1526    Education provided  Yes    Education Details  OT evaluation results, graduated driving approach once cleared by MD    Person(s) Educated  Patient    Methods  Explanation    Comprehension  Verbalized understanding                 Plan - 12/30/16 1642    Clinical Impression Statement  Patient is a 76 year old woman with L cerebellar medullary infarct, recently back in her own home after hospitalization 10/17-11/8, then discharged to son's home until yesterday, now back in her own town house.  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  Patient has overall decreased grasp strength, and decreased balance.  Patient is currently NPO except ice chips - and is having MBSS tomorrow to determine swallowing status.  Patient is modified independent with all aspects of ADL at this time, and is returning to most IADL tasks but has not yet been cleared to drive.  Patient was very active prior to this stroke, and is slowly building her activity level.  Patient does not have a functional deficit sufficient to warrant further OT services at this time.  Spoke at [employer] patient that if her situation changes she need only seek a new MD referral to be seen.  Patient is an appropriate candidate to be seen by both Physical and Speech Therapy at this time.      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[employer], has active church group, routinely exercises, enjoys cooking, cleaning, crosswords, reading    Plan  No further OT warranted    Clinical Decision Making  Limited treatment options, no task modification necessary    Recommended Other Services  PT/SLP    Consulted and Agree with Plan of Care  Patient       Patient will benefit from skilled therapeutic intervention in order to improve the following deficits and impairments:     Visit Diagnosis: Muscle weakness (generalized) - Plan: Ot plan of care cert/re-cert  Unsteadiness on feet - Plan: Ot plan of care cert/re-cert  G-Codes - 01/08/2017 1534    Functional Assessment Tool Used (Outpatient only)  Skilled clinical judgement    Functional Limitation  Self care    Self Care Current Status [phone])  At least 1 percent but less than 20 percent impaired, limited or restricted    Self Care Discharge Status [phone])  At least 1 percent but less than 20 percent impaired, limited or restricted       Problem List Patient  Active Problem List   Diagnosis Date Noted  . Cerebellar infarction (HCC) 11/03/2016  . Acute ischemic stroke (HCC)   . Benign essential HTN   . Dysarthria, post-stroke   . Leukocytosis   . Acute blood loss anemia   . 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 Acute encephalopathy 10/28/2016  . 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 INSECT BITE 07/04/2009  . HYPERLIPIDEMIA 06/03/2007  . ALLERGIC RHINITIS 06/03/2007  . VAGINITIS, ATROPHIC 06/03/2007  . CONTACT DERMATITIS&OTH ECZEMA DUE OTH SPEC AGENT 06/03/2007  . COLONIC POLYPS, HX OF 06/03/2007    Collier Salina, OTR/L 08-Jan-2017, 4:45 PM  [facility] [address] [place], [place], [address] Phone: [phone]   Fax:  [phone]  Name: MCKENLEE MANGHAM MRN: [id number] Date of Birth: 1940-03-29

## 2016-12-31 ENCOUNTER — Ambulatory Visit (HOSPITAL_COMMUNITY)
Admission: RE | Admit: 2016-12-31 | Discharge: 2016-12-31 | Disposition: A | Discharge status: Home / Self Care | Payer: Medicare Other | Source: Ambulatory Visit | Attending: Physical Medicine & Rehabilitation | Admitting: Physical Medicine & Rehabilitation

## 2016-12-31 DIAGNOSIS — I69391 Dysphagia following cerebral infarction: Secondary | ICD-10-CM | POA: Insufficient documentation

## 2016-12-31 DIAGNOSIS — R1310 Dysphagia, unspecified: Secondary | ICD-10-CM | POA: Insufficient documentation

## 2016-12-31 DIAGNOSIS — Z8673 Personal history of transient ischemic attack (TIA), and cerebral infarction without residual deficits: Secondary | ICD-10-CM | POA: Diagnosis present

## 2016-12-31 DIAGNOSIS — R1312 Dysphagia, oropharyngeal phase: Secondary | ICD-10-CM | POA: Insufficient documentation

## 2017-01-08 ENCOUNTER — Encounter: Payer: Self-pay | Admitting: Physical Medicine & Rehabilitation

## 2017-01-08 ENCOUNTER — Ambulatory Visit: Payer: Medicare Other | Admitting: Physical Medicine & Rehabilitation

## 2017-01-08 VITALS — BP 130/89 | HR 97 | Resp 14

## 2017-01-08 DIAGNOSIS — I69391 Dysphagia following cerebral infarction: Secondary | ICD-10-CM | POA: Diagnosis not present

## 2017-01-08 DIAGNOSIS — I639 Cerebral infarction, unspecified: Secondary | ICD-10-CM | POA: Diagnosis not present

## 2017-01-08 MED ORDER — ATORVASTATIN CALCIUM 40 MG PO TABS: 40.0000 mg | ORAL_TABLET | Freq: Every day | ORAL | 0 refills | Status: DC

## 2017-01-08 MED ORDER — SERTRALINE HCL 50 MG PO TABS: ORAL_TABLET | ORAL | 0 refills | Status: DC

## 2017-01-08 MED ORDER — CLOPIDOGREL BISULFATE 75 MG PO TABS: 75.0000 mg | ORAL_TABLET | Freq: Every day | ORAL | 0 refills | Status: DC

## 2017-01-08 NOTE — Patient Instructions (Addendum)
Discontinue tube feeding  Flush tube with water 60ml (~2oz) three times daily  Graduated return to driving instructions were provided. It is recommended that the patient first drives with another licensed driver in an empty parking lot. If the patient does well with this, and they can drive on a quiet street with the licensed driver. If the patient does well with this they can drive on a busy street with a licensed driver. If the patient does well with this, the next time out they can go by himself. For the first month after resuming driving, I recommend no nighttime or Interstate driving.

## 2017-01-08 NOTE — Progress Notes (Signed)
Subjective:  76 year old right-handed female with history of hypertension who lives alone, independent prior to admission.  Presented on October 28, 2016, after syncopal episode.  She became dizzy, had episode of vomiting.  Cranial CT scan negative.  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  MRI showed multiple brain stem and cerebellar infarctions.  No acute hemorrhage. Troponin negative.  Hypokalemia, 2.6, with supplement added. Echocardiogram with ejection fraction of 60%, grade 1 diastolic dysfunction.  The patient did initially require intubation, extubated on October 29, 2016.   Patient ID: [name], female    DOB: 04/05/1940, 76 y.o.   MRN: [id number]  HPI Patient returns after last visit on 12/15/2016.  The G-tube was placed on 11/17/2016  Patient has had no new issues since last visit F/u after MBS 12/20   CLINICAL DATA:  Medullary stroke. Aspiration on previous examination. Evaluation prior to PEG tube removal.  EXAM: MODIFIED BARIUM SWALLOW  TECHNIQUE: Different consistencies of barium were administered orally to the patient by the Speech Pathologist. Imaging of the pharynx was performed in the lateral projection.  FLUOROSCOPY TIME:  Fluoroscopy Time:  1 minutes and 58 seconds  Radiation Exposure Index (if provided by the fluoroscopic device): NA  Number of Acquired Spot Images: 0  COMPARISON:  None.  FINDINGS: Thin liquid- flash penetration without aspiration. Minimal vallecular pooling. No significant delay.  Nectar thick liquid- within normal limits  Honey- not administered.  Pure- mild vallecular pooling.  No penetration or aspiration.  Pure with cracker- mild vallecular pooling. No penetration or aspiration.  Barium tablet -  not administered.  IMPRESSION: 1. Mild vallecular pooling with all  consistencies. 2. Flash penetration with thin barium only.  No aspiration.  Please refer to the Speech Pathologists report for complete details and recommendations.   Electronically Signed   By: Carey BullocksWilliam  Veazey M.D.   On: 12/31/2016 12:15  Per SLP rec D3 thin diet which pt has been tolerating She has not tried meds po Still administering TF and H20 flushes  No abd pain  Pain Inventory Average Pain 0 Pain Right Now 0 My pain is no pain  In the last 24 hours, has pain interfered with the following? General activity 0 Relation with others 0 Enjoyment of life 0 What TIME of day is your pain at its worst? no pain Sleep (in general) Good  Pain is worse with: no pain Pain improves with: no pain Relief from Meds: no pain  Mobility walk with assistance use a walker ability to climb steps?  yes do you drive?  yes  Function retired Do you have any goals in this area?  no  Neuro/Psych trouble walking  Prior Studies Any changes since last visit?  no  Physicians involved in your care Any changes since last visit?  no   History reviewed. No pertinent family history. Social History   Socioeconomic History  . Marital status: Single    Spouse name: None  . Number of children: None  . Years of education: None  . Highest education level: None  Social Needs  . Financial resource strain: None  . Food insecurity - worry: None  . Food insecurity - inability: None  . Transportation needs - medical: None  . Transportation needs - non-medical: None  Occupational History  . None  Tobacco Use  . Smoking status: Never Smoker  . Smokeless tobacco:  Never Used  Substance and Sexual Activity  . Alcohol use: No  . Drug use: No  . Sexual activity: No  Other Topics Concern  . None  Social History Narrative   ** Merged History Encounter **       Past Surgical History:  Procedure Laterality Date  . BACK SURGERY    . CHOLECYSTECTOMY  2006  . IR GASTROSTOMY TUBE MOD  SED  11/17/2016   Past Medical History:  Diagnosis Date  . Arthritis   . Hypertension    BP 130/89 (BP Location: Left Arm, Patient Position: Sitting, Cuff Size: Normal)   Pulse 97   Resp 14   SpO2 97%   Opioid Risk Score:   Fall Risk Score:  `1  Depression screen PHQ 2/9  No flowsheet data found.  Review of Systems  Constitutional: Negative.   HENT: Negative.   Eyes: Negative.   Respiratory: Negative.   Cardiovascular: Negative.   Gastrointestinal: Negative.   Endocrine: Negative.   Genitourinary: Negative.   Musculoskeletal: Positive for gait problem.  Skin: Negative.   Allergic/Immunologic: Negative.   Hematological: Negative.   Psychiatric/Behavioral: Negative.        Objective:   Physical Exam  Constitutional: She is oriented to person, place, and time. She appears well-developed and well-nourished. No distress.  HENT:  Head: Normocephalic and atraumatic.  Abdominal: Soft. Bowel sounds are normal. She exhibits no distension. There is no tenderness.  G tube site CDI  Neurological: She is alert and oriented to person, place, and time.  Skin: She is not diaphoretic.  Psychiatric: She has a normal mood and affect.  Nursing note and vitals reviewed.  Lungs are clear to auscultation Ambulates without assistive device no evidence of toe drag or knee instability.  She does have a wide-based support. Motor strength 5/5 bilateral deltoid bicep tricep grip hip flexor knee extensor ankle dose flexor no evidence of dysmetria on finger-nose-finger testing      Assessment & Plan:  #1.  Cerebellar and brainstem infarcts with history of dysphagia which has now improved.  May discontinue tube feeds.  Try taking medications by mouth, may take with applesauce if she has difficulty.  Schedule for tube removal. Outpatient PT and OT continue, once tube is out she will likely not need any speech therapy. Physical medicine and rehabilitation follow-up on as-needed basis

## 2017-01-15 ENCOUNTER — Other Ambulatory Visit: Payer: Self-pay | Admitting: Physical Medicine & Rehabilitation

## 2017-01-15 DIAGNOSIS — R1310 Dysphagia, unspecified: Secondary | ICD-10-CM

## 2017-01-18 ENCOUNTER — Encounter (HOSPITAL_COMMUNITY): Payer: Self-pay | Admitting: Physician Assistant

## 2017-01-18 ENCOUNTER — Ambulatory Visit (HOSPITAL_COMMUNITY)
Admission: RE | Admit: 2017-01-18 | Discharge: 2017-01-18 | Disposition: A | Discharge status: Home / Self Care | Payer: Medicare Other | Source: Ambulatory Visit | Attending: Physical Medicine & Rehabilitation | Admitting: Physical Medicine & Rehabilitation

## 2017-01-18 ENCOUNTER — Ambulatory Visit: Payer: Medicare Other | Attending: Physical Medicine & Rehabilitation | Admitting: Rehabilitation

## 2017-01-18 DIAGNOSIS — Z431 Encounter for attention to gastrostomy: Secondary | ICD-10-CM | POA: Diagnosis not present

## 2017-01-18 DIAGNOSIS — R1310 Dysphagia, unspecified: Secondary | ICD-10-CM

## 2017-01-18 DIAGNOSIS — R2681 Unsteadiness on feet: Secondary | ICD-10-CM | POA: Insufficient documentation

## 2017-01-18 DIAGNOSIS — M6281 Muscle weakness (generalized): Secondary | ICD-10-CM | POA: Insufficient documentation

## 2017-01-18 DIAGNOSIS — R2689 Other abnormalities of gait and mobility: Secondary | ICD-10-CM | POA: Insufficient documentation

## 2017-01-18 DIAGNOSIS — R1312 Dysphagia, oropharyngeal phase: Secondary | ICD-10-CM | POA: Insufficient documentation

## 2017-01-18 DIAGNOSIS — R29818 Other symptoms and signs involving the nervous system: Secondary | ICD-10-CM | POA: Insufficient documentation

## 2017-01-18 HISTORY — PX: IR GASTROSTOMY TUBE REMOVAL: IMG5492

## 2017-01-18 MED ORDER — LIDOCAINE VISCOUS 2 % MT SOLN
OROMUCOSAL | Status: AC
  Filled 2017-01-18: qty 15

## 2017-01-18 NOTE — Progress Notes (Signed)
  Successful removal of gastrostomy tube.  No complications. Dressing placed.  Instructions given to patient.  Tiffancy Moger S Dru Primeau PA-C 01/18/2017 10:50 AM

## 2017-01-20 ENCOUNTER — Ambulatory Visit: Payer: Medicare Other | Admitting: Rehabilitative and Restorative Service Providers"

## 2017-01-20 ENCOUNTER — Encounter: Payer: Self-pay | Admitting: Rehabilitative and Restorative Service Providers"

## 2017-01-20 ENCOUNTER — Ambulatory Visit: Payer: Medicare Other

## 2017-01-20 ENCOUNTER — Other Ambulatory Visit: Payer: Self-pay

## 2017-01-20 DIAGNOSIS — R1312 Dysphagia, oropharyngeal phase: Secondary | ICD-10-CM | POA: Diagnosis present

## 2017-01-20 DIAGNOSIS — R2689 Other abnormalities of gait and mobility: Secondary | ICD-10-CM

## 2017-01-20 DIAGNOSIS — R29818 Other symptoms and signs involving the nervous system: Secondary | ICD-10-CM

## 2017-01-20 DIAGNOSIS — M6281 Muscle weakness (generalized): Secondary | ICD-10-CM | POA: Diagnosis present

## 2017-01-20 DIAGNOSIS — R2681 Unsteadiness on feet: Secondary | ICD-10-CM

## 2017-01-20 NOTE — Patient Instructions (Signed)
Feet Apart, Varied Arm Positions - Eyes Closed    Stand with feet shoulder width apart and arms at your side.  Close eyes and visualize upright position. Hold __30__ seconds. Repeat __3__ times per session. Do ____ sessions per day.  Copyright  VHI. All rights reserved.   Feet Together, Head Motion - Eyes Open    With eyes open, feet together, move head slowly: side to side 5-10 times. Do _2___ sessions per day. Copyright  VHI. All rights reserved.   Feet Partial Heel-Toe, Varied Arm Positions - Eyes Open    With eyes open, right foot partially in front of the other, arms out, look straight ahead at a stationary object. Hold _30___ seconds, then switch feet.  Repeat __3__ times per session. Do __2__ sessions per day.  Copyright  VHI. All rights reserved.   SINGLE LIMB STANCE    Stance: single leg on floor. Raise leg. Hold _10__ seconds. Repeat with other leg. _3__ reps per set, _2__ sets per day. HAVE ONE HAND/FINGERTIPS TOUCHING COUNTERTOP FOR SUPPORT.  Copyright  VHI. All rights reserved.

## 2017-01-20 NOTE — Patient Instructions (Signed)
  Added exercises:  1. Shaker Exercise - head lift - Lie flat on your back in your bed or on a couch without pillows - Raise your head and look at your feet - KEEP YOUR SHOULDERS DOWN - HOLD FOR 45-60 SECONDS, then lower your head back down - Repeat 3 times, 2-3 times a day  2. Mendelsohn Maneuver - "half swallow" exercise - Start to swallow, and keep your Adam's apple up by squeezing hard with the            muscles of the throat - Hold the squeeze for 5-7 seconds and then relax - Repeat 15-20 times, 2-3 times a day *use a wet spoon if your mouth gets dry*        3.  Open mouth swallow  - Open your mouth and swallow your saliva  - Repeat 10 times, 2-3 times a day

## 2017-01-20 NOTE — Therapy (Addendum)
[facility] [address] [place], [place], [address] Phone: [phone]   Fax:  [phone]  Physical Therapy Treatment  Patient Details  Name: [name] MRN: [id number] Date of Birth: 1940/09/17 Referring Provider: Claudette Laws   Encounter Date: 01/20/2017  PT End of Session - 01/20/17 1405    Visit Number  2    Number of Visits  17 eval + 16 visits    Date for PT Re-Evaluation  02/28/17    Authorization Type  G code every 10th visit    PT Start Time  1152    PT Stop Time  1232    PT Time Calculation (min)  40 min    Equipment Utilized During Treatment  Gait belt    Activity Tolerance  Patient tolerated treatment well    Behavior During Therapy  [facility] for tasks assessed/performed       Past Medical History:  Diagnosis Date  . Arthritis   . Hypertension     Past Surgical History:  Procedure Laterality Date  . BACK SURGERY    . CHOLECYSTECTOMY  2006  . IR GASTROSTOMY TUBE MOD SED  11/17/2016  . IR GASTROSTOMY TUBE REMOVAL  01/18/2017    There were no vitals filed for this visit.  Subjective Assessment - 01/20/17 1156    Subjective  The patient notes she has been waiting in the lobby after 8:45 speech appt.  Things are going well at home-- she is walking at home withou tthe walker.  She notes the delay in beginning care was related to the holidays.    Establishes primary care with Helane Rima @ [facility] this Friday.    Patient Stated Goals  "Walk on my own" without rolling walker outdoors.    Currently in Pain?  No/denies                      [facility] Adult PT Treatment/Exercise - 01/20/17 1158      Ambulation/Gait   Ambulation/Gait  Yes    Ambulation/Gait Assistance  4: Min guard    Ambulation Distance (Feet)  400 Feet 200 x 3    Assistive device  Rollator;None    Ambulation Surface  Level;Indoor    Ramp  4: Min assist    Ramp Details (indicate cue type and reason)  --    Curb  4: Min  assist    Curb Details (indicate cue type and reason)  Worked on leading with L LE stepping up onto curb with min A for safety    Gait Comments  Gait activities emphasizing narrowing base of support; marching with min A x 115 ft, ball toss with gait x 115 ft with min A, gait with horizontal head motion x 50 ft x 2 with min A.      Neuro Re-ed    Neuro Re-ed Details   Corner balance exercises with feet apart + eyes closed x 10 seconds progressing up to 30 seconds with 3 reps, then feet together + horizontal head motion and then vertical head mtion x 10 reps.  Partial heel/toe standing dec'ing UE support.  Single limb stance near support surface.  Turning progression beginning with spotting objects for 1/2 turns, then full turns, then turns with gait activities.  Figure 8 walking with spotting for visual feedback.                01/20/17 1412  PT Education  Education provided Yes  Education Details  home program for balance established  Person(s) Educated Patient  Methods Explanation;Demonstration;Handout  Comprehension Verbalized understanding;Returned demonstration     PT Short Term Goals - 12/30/16 1330      PT SHORT TERM GOAL #1   Title  The patient will be indep with HEP for balance and general mobility.    Time  4    Period  Weeks    Target Date  01/29/17      PT SHORT TERM GOAL #2   Title  The patient will improve gait speed from 2.38 ft/sec to > or equal to 2.62 ft/sec to demo transition to "full community ambulator" classification of gait.    Time  4    Period  Weeks    Target Date  01/29/17      PT SHORT TERM GOAL #3   Title  The patient will improve TUG from 19.91 seconds to < or equal to 16 seconds to demo improving functional mobility.    Time  4    Period  Weeks    Target Date  01/29/17      PT SHORT TERM GOAL #4   Title  The patient will improve Berg from 39/56 to > or equal to 44/56 to demo dec'ing risk for falls.    Time  4    Period  Weeks    Target Date   01/29/17      PT SHORT TERM GOAL #5   Title  The patient will ambulate x 250 ft in the clinic without loss of balance to demo improved safety for household ambulation.    Time  4    Period  Weeks    Target Date  01/29/17        PT Long Term Goals - 12/30/16 1332      PT LONG TERM GOAL #1   Title  The patient will be indep with progression of HEP.    Time  8    Period  Weeks    Target Date  02/28/17      PT LONG TERM GOAL #2   Title  The patient will improve Berg from 39/56 to > or equal than 46/56 to demo dec'd fall risk.    Time  8    Period  Weeks    Target Date  02/28/17      PT LONG TERM GOAL #3   Title  The patient will improve TUG from 19.91 seconds to < or equal to 14 seconds to demo dec'ing risk for falls.    Time  8    Period  Weeks    Target Date  02/28/17      PT LONG TERM GOAL #4   Title  The patinet will improve gait speed from 2.38 ft/sec to > or equal to 3.0 ft/sec to demo improving mobility.    Time  8    Period  Weeks    Target Date  02/28/17      PT LONG TERM GOAL #5   Title  The patient will improve functional status survey from 50% to > or equal to 62% to demo improving functional mobility.    Time  8    Period  Weeks    Target Date  02/28/17            Plan - 01/20/17 1408    Clinical Impression Statement  PT established balance home exercise program.  Patient demonstrating progress with gait per a more normal base of support (  wide at eval).  PT emphasizing arm swing and a less guarded position when ambulating.  Patient has intermittent losses of balance with turns.  Continue working towards STGs/LTGs.     PT Treatment/Interventions  ADLs/Self Care Home Management;Balance training;Neuromuscular re-education;Patient/family education;Gait training;Stair training;Functional mobility training;Therapeutic activities;Therapeutic exercise;Manual techniques    PT Next Visit Plan  Dynamic gait activities emphasizing a more narrow base of support,  direction changes, head motion, and turns (in functional context for home negotiation and during transitions).  Gait without rollater RW; balance training.    Consulted and Agree with Plan of Care  Patient       Patient will benefit from skilled therapeutic intervention in order to improve the following deficits and impairments:  Abnormal gait, Decreased balance, Decreased strength, Difficulty walking  Visit Diagnosis: Other abnormalities of gait and mobility  Unsteadiness on feet  Other symptoms and signs involving the nervous system     Problem List Patient Active Problem List   Diagnosis Date Noted  . Cerebellar infarction (HCC) 11/03/2016  . Acute ischemic stroke (HCC)   . Benign essential HTN   . Dysarthria, post-stroke   . Leukocytosis   . Acute blood loss anemia   . 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 Acute encephalopathy 10/28/2016  . 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 INSECT BITE 07/04/2009  . HYPERLIPIDEMIA 06/03/2007  . ALLERGIC RHINITIS 06/03/2007  . VAGINITIS, ATROPHIC 06/03/2007  . CONTACT DERMATITIS&OTH ECZEMA DUE OTH SPEC AGENT 06/03/2007  . COLONIC POLYPS, HX OF 06/03/2007    Tisheena Maguire, PT 01/20/2017, 2:10 PM  [facility] [address] [place], [place], [address] Phone: [phone]   Fax:  [phone]  Name: [name] MRN: [id number] Date of Birth: 10/29/1940

## 2017-01-20 NOTE — Therapy (Signed)
[facility] [address] [place], [place], [address] Phone: [phone]   Fax:  [phone]  Speech Language Pathology Treatment  Patient Details  Name: [name] MRN: [id number] Date of Birth: 08/16/1940 Referring Provider: Claudette LawsKirsteins, Andrew, MD   Encounter Date: 01/20/2017  End of Session - 01/20/17 1728    Visit Number  2    Number of Visits  17    Date for SLP Re-Evaluation  03/05/17    SLP Start Time  0850    SLP Stop Time   0930    SLP Time Calculation (min)  40 min    Activity Tolerance  Patient tolerated treatment well       Past Medical History:  Diagnosis Date  . Arthritis   . Hypertension     Past Surgical History:  Procedure Laterality Date  . BACK SURGERY    . CHOLECYSTECTOMY  2006  . IR GASTROSTOMY TUBE MOD SED  11/17/2016  . IR GASTROSTOMY TUBE REMOVAL  01/18/2017    There were no vitals filed for this visit.  Subjective Assessment - 01/20/17 0855    Subjective  Pt with dys III/thin recommendation following modified (MBSS) 12-31-16.    Currently in Pain?  No/denies            ADULT SLP TREATMENT - 01/20/17 0856      General Information   Behavior/Cognition  Alert;Cooperative;Pleasant mood      Treatment Provided   Treatment provided  Dysphagia      Dysphagia Treatment   Temperature Spikes Noted  No does not report any other overt s/s aspriation PNA    Respiratory Status  Room air    Treatment Methods  Skilled observation;Therapeutic exercise;Compensation strategy training;Patient/caregiver education    Patient observed directly with PO's  Yes    Type of PO's observed  Thin liquids;Dysphagia 3 (soft)    Oral Phase Signs & Symptoms  -- none noted    Pharyngeal Phase Signs & Symptoms  Delayed throat clear    Amount of cueing  Modified independent SLP repeated pt swallow precautions prior to POs    Other treatment/comments  SLP took measurements for pt's expiratory muscle  trainer. Pt's average MEP was 79.5 cm H2O with spiromieter. SLP set pt's expiratory trainer at approx 50cm H2O, at which point pt reported 7/10 effort level over 7 reps. She req'd visual cues to keep buccal muscles tight and not puff cheeks with exhalations. SLP added three exercises to pt's regimen - Mendelsohn, Shaker, adn open mouth swallow. Pt return demonstrated all three. Mendelsohn  and open mouth swallow were most challenging for pt.      Assessment / Recommendations / Plan   Plan  Continue with current plan of care      Progression Toward Goals   Progression toward goals  Progressing toward goals       SLP Education - 01/20/17 1727    Education provided  Yes    Education Details  new dysphagia exercises, swalllw precautions    Person(s) Educated  Patient    Methods  Explanation;Verbal cues;Handout    Comprehension  Verbalized understanding;Returned demonstration;Need further instruction;Verbal cues required       SLP Short Term Goals - 01/20/17 1730      SLP SHORT TERM GOAL #1   Title  pt will complete HEP with modified independence over 3 sessions    Time  4    Period  Weeks    Status  On-going  SLP SHORT TERM GOAL #2   Title  pt will use any recommended safe swallow strategies following modified barium swallow (MBSS) during 3 ST sessions    Baseline  01-20-17    Time  4    Period  Weeks    Status  On-going      SLP SHORT TERM GOAL #3   Title  pt will thicken liqiuds to prescribed consistency with modified independence, PRN following MBSS    Status  Deferred thin recommended       SLP Long Term Goals - 01/20/17 1731      SLP LONG TERM GOAL #1   Title  pt will perform swallow HEP correctly with modified independence over 5 sessions    Time  8    Period  Weeks    Status  On-going      SLP LONG TERM GOAL #2   Title  pt will adhere to any safe swallow strategies with POs during 5 ST sessions with modified independence    Baseline  01-20-17    Time  8     Period  Weeks    Status  On-going      SLP LONG TERM GOAL #3   Title  pt will thicken liquids (PRN) to prescrbed consistency with modified independence over 3 sessions    Status  Deferred thin recommended       Plan - 01/20/17 1728    Clinical Impression Statement  Pt presents today with mild dysphagia as ID'd on modifeid (MBSS) 01-01-17. Dys III/thin were recommended. Pt has ingested that diet since 01-02-17 and does not exhibit overt s/s aspiration PNA to date. Skilled ST remains necessary for continuing to assist pt in improving her swallowing skills by HEP and to teach and ensure correct swallow precautions used with POs    Speech Therapy Frequency  2x / week    Duration  -- 8 weeks    Treatment/Interventions  Aspiration precaution training;Pharyngeal strengthening exercises;Diet toleration management by SLP;Trials of upgraded texture/liquids;Internal/external aids;Patient/family education;Compensatory strategies;SLP instruction and feedback;Oral motor exercises;Cueing hierarchy;Environmental controls;NMES any or all may be used    Potential to Achieve Goals  Good       Patient will benefit from skilled therapeutic intervention in order to improve the following deficits and impairments:   Dysphagia, oropharyngeal phase    Problem List Patient Active Problem List   Diagnosis Date Noted  . Cerebellar infarction (HCC) 11/03/2016  . Acute ischemic stroke (HCC)   . Benign essential HTN   . Dysarthria, post-stroke   . Leukocytosis   . Acute blood loss anemia   . 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 Acute encephalopathy 10/28/2016  . 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 INSECT BITE 07/04/2009  . HYPERLIPIDEMIA 06/03/2007  . ALLERGIC RHINITIS 06/03/2007  . VAGINITIS, ATROPHIC 06/03/2007  . CONTACT DERMATITIS&OTH ECZEMA DUE OTH SPEC AGENT 06/03/2007  . COLONIC POLYPS, HX OF 06/03/2007    [facility]  ,MS, CCC-SLP  01/20/2017, 5:32 PM  [facility] [facility] [address] [place], [place], [address] Phone: [phone]   Fax:  [phone]   Name: [name] MRN: [id number] Date of Birth: Jan 05, 1941

## 2017-01-21 ENCOUNTER — Other Ambulatory Visit: Payer: Self-pay

## 2017-01-21 ENCOUNTER — Ambulatory Visit: Payer: Medicare Other

## 2017-01-21 ENCOUNTER — Encounter: Payer: Self-pay | Admitting: Rehabilitation

## 2017-01-21 ENCOUNTER — Ambulatory Visit: Payer: Medicare Other | Admitting: Rehabilitation

## 2017-01-21 DIAGNOSIS — R2681 Unsteadiness on feet: Secondary | ICD-10-CM

## 2017-01-21 DIAGNOSIS — R2689 Other abnormalities of gait and mobility: Secondary | ICD-10-CM

## 2017-01-21 DIAGNOSIS — R1312 Dysphagia, oropharyngeal phase: Secondary | ICD-10-CM

## 2017-01-21 DIAGNOSIS — M6281 Muscle weakness (generalized): Secondary | ICD-10-CM

## 2017-01-21 NOTE — Therapy (Signed)
[facility] [address] [place], [place], [address] Phone: [phone]   Fax:  [phone]  Speech Language Pathology Treatment  Patient Details  Name: [name] MRN: [id number] Date of Birth: 10/14/1940 Referring Provider: Claudette LawsKirsteins, Andrew, MD   Encounter Date: 01/21/2017  End of Session - 01/21/17 1656    Visit Number  3    Number of Visits  17    Date for SLP Re-Evaluation  03/05/17    SLP Start Time  1535    SLP Stop Time   1615    SLP Time Calculation (min)  40 min    Activity Tolerance  Patient tolerated treatment well       Past Medical History:  Diagnosis Date  . Arthritis   . Hypertension     Past Surgical History:  Procedure Laterality Date  . BACK SURGERY    . CHOLECYSTECTOMY  2006  . IR GASTROSTOMY TUBE MOD SED  11/17/2016  . IR GASTROSTOMY TUBE REMOVAL  01/18/2017    There were no vitals filed for this visit.  Subjective Assessment - 01/21/17 1538    Subjective  Cheese frittata with spinach for breakfast.     Currently in Pain?  No/denies            ADULT SLP TREATMENT - 01/21/17 1541      General Information   Behavior/Cognition  Alert;Cooperative;Pleasant mood      Treatment Provided   Treatment provided  Dysphagia      Dysphagia Treatment   Temperature Spikes Noted  No    Respiratory Status  Room air    Treatment Methods  Skilled observation;Therapeutic exercise;Compensation strategy training;Patient/caregiver education    Patient observed directly with PO's  Yes    Type of PO's observed  Thin liquids;Dysphagia 3 (soft)    Oral Phase Signs & Symptoms  -- none noted    Pharyngeal Phase Signs & Symptoms  Delayed throat clear    Amount of cueing  Independent    Other treatment/comments  SLP faciliatated work with pt's HEP for swallowing and with observation for adherence to swallow/aspiration precautions. Pt with independent adherence to precautions and min verbal cues for  HEP (shaker, mendelsohn). Pt without questions re: either, agrees with reducing to x1/week.      Assessment / Recommendations / Plan   Plan  -- decr to once/week due to progress      Progression Toward Goals   Progression toward goals  Progressing toward goals       SLP Education - 01/20/17 1727    Education provided  Yes    Education Details  new dysphagia exercises, swalllw precautions    Person(s) Educated  Patient    Methods  Explanation;Verbal cues;Handout    Comprehension  Verbalized understanding;Returned demonstration;Need further instruction;Verbal cues required       SLP Short Term Goals - 01/21/17 1658      SLP SHORT TERM GOAL #1   Title  pt will complete HEP with modified independence over 3 sessions    Baseline  01-21-17    Time  4    Period  Weeks    Status  On-going      SLP SHORT TERM GOAL #2   Title  pt will use any recommended safe swallow strategies following modified barium swallow (MBSS) during 3 ST sessions    Baseline  01-20-17, 01-21-17    Time  4    Period  Weeks    Status  On-going      SLP SHORT TERM GOAL #3   Title  pt will thicken liqiuds to prescribed consistency with modified independence, PRN following MBSS    Status  Deferred thin recommended       SLP Long Term Goals - 01/21/17 1658      SLP LONG TERM GOAL #1   Title  pt will perform swallow HEP correctly with modified independence over 4 sessions    Baseline  01-21-17    Time  8    Period  Weeks    Status  Revised      SLP LONG TERM GOAL #2   Title  pt will adhere to any safe swallow strategies with POs during 4 ST sessions with modified independence    Baseline  01-20-17, 01-21-17    Time  8    Period  Weeks    Status  Revised      SLP LONG TERM GOAL #3   Title  pt will thicken liquids (PRN) to prescrbed consistency with modified independence over 3 sessions    Status  Deferred thin recommended       Plan - 01/21/17 1657    Clinical Impression Statement  Pt presents today  with cont'd mild dysphagia as ID'd on modifeid (MBSS) 01-01-17. Pt is independent with swallow/aspiration precautions and rare min A with new exercises provided last session. Pt return demonstrated Shaker exercise. Once a week is appropriate given this. Skilled ST remains necessary for continuing to assist pt in improving her swallowing skills by HEP and to teach and ensure correct swallow precautions used with POs    Speech Therapy Frequency  1x /week    Duration  -- 8 weeks    Treatment/Interventions  Aspiration precaution training;Pharyngeal strengthening exercises;Diet toleration management by SLP;Trials of upgraded texture/liquids;Internal/external aids;Patient/family education;Compensatory strategies;SLP instruction and feedback;Oral motor exercises;Cueing hierarchy;Environmental controls;NMES any or all may be used    Potential to Achieve Goals  Good       Patient will benefit from skilled therapeutic intervention in order to improve the following deficits and impairments:   Dysphagia, oropharyngeal phase    Problem List Patient Active Problem List   Diagnosis Date Noted  . Cerebellar infarction (HCC) 11/03/2016  . Acute ischemic stroke (HCC)   . Benign essential HTN   . Dysarthria, post-stroke   . Leukocytosis   . Acute blood loss anemia   . 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 Acute encephalopathy 10/28/2016  . 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 INSECT BITE 07/04/2009  . HYPERLIPIDEMIA 06/03/2007  . ALLERGIC RHINITIS 06/03/2007  . VAGINITIS, ATROPHIC 06/03/2007  . CONTACT DERMATITIS&OTH ECZEMA DUE OTH SPEC AGENT 06/03/2007  . COLONIC POLYPS, HX OF 06/03/2007    [facility] ,MS, CCC-SLP  01/21/2017, 4:59 PM  [facility] [address] [place], [place], [address] Phone: [phone]   Fax:  [phone]   Name: [name] MRN:  [id number] Date of Birth: [birth year]

## 2017-01-21 NOTE — Patient Instructions (Signed)
  1. Shaker Exercise - head lift - Lie flat on your back in your bed or on a couch without pillows - Raise your head and look at your feet - KEEP YOUR SHOULDERS DOWN - HOLD FOR 45-60 SECONDS, then lower your head back down - Repeat 3 times, 2-3 times a day   2. Mendelsohn Maneuver - "half swallow" exercise - Start to swallow, and keep your Adam's apple up by squeezing hard with the muscles of the throat - Hold the squeeze for 5-7 seconds and then relax - Repeat 15-20 times, 2-3 times a day *use a wet spoon if your mouth gets dry*         3.  Open mouth swallow           - Open your mouth and swallow your saliva, or use a wet spoon           - Repeat 10 times, 2-3 times a day        4.  Opening mouth  - open your mouth as wide as you can and hold for 10 seconds - do 10 times, 2-3 times a day  1. Effortful Swallow - Press your tongue against the roof of your mouth for 3 seconds, then squeeze the muscles in your neck while you swallow your saliva or a sip of water - Repeat 20 times, 2-3 times a day, and use whenever you eat or drink

## 2017-01-22 ENCOUNTER — Encounter: Payer: Self-pay | Admitting: Family Medicine

## 2017-01-22 ENCOUNTER — Ambulatory Visit: Payer: Medicare Other | Admitting: Family Medicine

## 2017-01-22 VITALS — BP 130/86 | HR 100 | Temp 98.6°F | Wt 128.8 lb

## 2017-01-22 DIAGNOSIS — E782 Mixed hyperlipidemia: Secondary | ICD-10-CM

## 2017-01-22 DIAGNOSIS — L980 Pyogenic granuloma: Secondary | ICD-10-CM

## 2017-01-22 DIAGNOSIS — D649 Anemia, unspecified: Secondary | ICD-10-CM | POA: Insufficient documentation

## 2017-01-22 DIAGNOSIS — Z8673 Personal history of transient ischemic attack (TIA), and cerebral infarction without residual deficits: Secondary | ICD-10-CM | POA: Diagnosis not present

## 2017-01-22 DIAGNOSIS — I10 Essential (primary) hypertension: Secondary | ICD-10-CM

## 2017-01-22 MED ORDER — CEFDINIR 300 MG PO CAPS: 300.0000 mg | ORAL_CAPSULE | Freq: Two times a day (BID) | ORAL | 0 refills | Status: DC

## 2017-01-22 MED ORDER — FREESTYLE LIBRE SENSOR SYSTEM MISC: 1.0000 [IU] | 12 refills | Status: DC

## 2017-01-22 MED ORDER — GUAIFENESIN-CODEINE 100-10 MG/5ML PO SYRP: 10.0000 mL | ORAL_SOLUTION | Freq: Every evening | ORAL | 0 refills | Status: DC | PRN

## 2017-01-22 MED ORDER — FREESTYLE LIBRE READER DEVI: 1.0000 | Freq: Once | 0 refills | Status: DC

## 2017-01-22 MED ORDER — BENZONATATE 100 MG PO CAPS: 100.0000 mg | ORAL_CAPSULE | Freq: Three times a day (TID) | ORAL | 1 refills | Status: DC | PRN

## 2017-01-22 NOTE — Progress Notes (Signed)
[name] is a 77 y.o. female is here to [facility].   Patient Care Team: Helane Rima, DO as PCP - General (Family Medicine)   History of Present Illness:   HPI: See Assessment and Plan section for Problem Based Charting of issues discussed today.  Health Maintenance Due  Topic Date Due  . TETANUS/TDAP  09/15/1961  . DEXA SCAN  09/16/2007  . PNA vac Low Risk Adult (1 of 2 - PCV13) 09/16/2007  . COLONOSCOPY  11/08/2014  . INFLUENZA VACCINE  08/12/2016  . MAMMOGRAM  12/11/2016   Depression screen PHQ 2/9 01/22/2017  Decreased Interest 0  Down, Depressed, Hopeless 0  PHQ - 2 Score 0   PMHx, SurgHx, SocialHx, Medications, and Allergies were reviewed in the Visit Navigator and updated as appropriate.   Past Medical History:  Diagnosis Date  . Allergic rhinitis 06/03/2007  . Arthritis   . Conductive hearing loss of both ears   . History of colon polyps   . Hypertension    Past Surgical History:  Procedure Laterality Date  . BACK SURGERY    . CHOLECYSTECTOMY  2006  . IR GASTROSTOMY TUBE MOD SED  11/17/2016  . IR GASTROSTOMY TUBE REMOVAL  01/18/2017   History reviewed. No pertinent family history. Social History   Tobacco Use  . Smoking status: Never Smoker  . Smokeless tobacco: Never Used  Substance Use Topics  . Alcohol use: No  . Drug use: No   Current Medications and Allergies:   .  aspirin 325 MG tablet, Place 1 tablet (325 mg total) daily into feeding tube., Disp: , Rfl:  .  atorvastatin (LIPITOR) 40 MG tablet, Take 1 tablet (40 mg total) by mouth daily at 6 PM., Disp: 90 tablet, Rfl: 0 .  clopidogrel (PLAVIX) 75 MG tablet, Take 1 tablet (75 mg total) by mouth daily., Disp: 90 tablet, Rfl: 0 .  Multiple Vitamin (MULTIVITAMIN WITH MINERALS) TABS tablet, Place 1 tablet daily into feeding tube., Disp: , Rfl:  .  sertraline (ZOLOFT) 50 MG tablet, Take by mouth daily, Disp: 90 tablet, Rfl: 0   Allergies  Allergen Reactions  . Lisinopril Other (See  Comments)    Dizziness and felt odd  . Lisinopril     Dizziness per merged chart    Review of Systems:   Pertinent items are noted in the HPI. Otherwise, ROS is negative.  Vitals:   Vitals:   01/22/17 1406  BP: 130/86  Pulse: 100  Temp: 98.6 F (37 C)  TempSrc: Oral  SpO2: 97%  Weight: 128 lb 12.8 oz (58.4 kg)     Body mass index is 25.15 kg/m.  Physical Exam:   Physical Exam  Constitutional: She is oriented to person, place, and time. She appears well-developed and well-nourished. No distress.  HENT:  Head: Normocephalic and atraumatic.  Right Ear: External ear normal.  Left Ear: External ear normal.  Nose: Nose normal.  Mouth/Throat: Oropharynx is clear and moist.  Eyes: Conjunctivae and EOM are normal. Pupils are equal, round, and reactive to light.  Neck: Normal range of motion. Neck supple. No thyromegaly present.  Cardiovascular: Normal rate, regular rhythm, normal heart sounds and intact distal pulses.  Pulmonary/Chest: Effort normal and breath sounds normal.  Abdominal: Soft. Bowel sounds are normal.    Musculoskeletal: Normal range of motion.  Lymphadenopathy:    She has no cervical adenopathy.  Neurological: She is alert and oriented to person, place, and time.  Skin: Skin is warm and  dry. Capillary refill takes less than 2 seconds.  Psychiatric: She has a normal mood and affect. Her behavior is normal.  Nursing note and vitals reviewed.  Assessment and Plan:   1. Benign essential HTN Patient states that she was previously taking losartan.  She has not been on the medication since her hospital stay.  Her blood pressure has been checked on multiple occasions and remains in the systolic 120s-130s.  She denies any headaches, dizziness, chest pain, shortness of breath, or lower extremity edema.  - Comprehensive metabolic panel  2. Mixed hyperlipidemia Patient is taking the Lipitor without any concerns.  She denies any myalgias.  Lab Results  Component  Value Date   CHOL 206 (H) 10/29/2016   HDL 49 10/29/2016   LDLCALC 134 (H) 10/29/2016   LDLDIRECT 155.5 08/23/2009   TRIG 117 10/29/2016   CHOLHDL 4.2 10/29/2016   - Comprehensive metabolic panel  3. Status post stroke Patient had dysarthria post stroke and had a G-tube placed.  She has improved enough to have the G-tube removed last week.  She states that she feels very well.  She is staying hydrated.  Weight is maintaining.  She continues a post stroke rehab for balance.  She denies any other concerns today.  She is continuing to take aspirin and Plavix.  - TSH  4. Pyogenic granuloma New issue after removal of the G-tube.  It was chemically cauterized with silver nitrate today without any complications in the usual manner.  Wound aftercare instructions were reviewed.  Red flags were reviewed.  - CBC with Differential/Platelet  5. Anemia, unspecified type Discovered during chart evaluation.  CBC from today is pending.   . Reviewed expectations re: course of current medical issues. . Discussed self-management of symptoms. . Outlined signs and symptoms indicating need for more acute intervention. . Patient verbalized understanding and all questions were answered. Marland Kitchen. Health Maintenance issues including appropriate healthy diet, exercise, and smoking avoidance were discussed with patient. . See orders for this visit as documented in the electronic medical record. . Patient received an After Visit Summary.  Helane RimaErica Jerrold Haskell, DO [place], [facility] 01/22/2017

## 2017-01-22 NOTE — Patient Instructions (Signed)
Feet Apart, Varied Arm Positions - Eyes Closed    Stand with feet shoulder width apart and arms at your side.  Close eyes and visualize upright position. Hold __30__ seconds. Repeat __3__ times per session. Do ____ sessions per day.  Copyright  VHI. All rights reserved.   Feet Together, Head Motion - Eyes Open    With eyes open, feet together, move head slowly: side to side 5-10 times. Do _2___ sessions per day. Copyright  VHI. All rights reserved.   Feet Partial Heel-Toe, Varied Arm Positions - Eyes Open    With eyes open, right foot partially in front of the other, arms out, look straight ahead at a stationary object. Hold _30___ seconds, then switch feet.  Repeat __3__ times per session. Do __2__ sessions per day.

## 2017-01-22 NOTE — Therapy (Signed)
[facility] [address] [place], [place], [address] Phone: [phone]   Fax:  [phone]  Physical Therapy Treatment  Patient Details  Name: [name] MRN: [id number] Date of Birth: March 13, 1940 Referring Provider: Claudette Laws   Encounter Date: 01/21/2017  PT End of Session - 01/22/17 0910    Visit Number  3    Number of Visits  17 eval + 16 visits    Date for PT Re-Evaluation  02/28/17    Authorization Type  G code every 10th visit    PT Start Time  1618    PT Stop Time  1700    PT Time Calculation (min)  42 min    Equipment Utilized During Treatment  Gait belt    Activity Tolerance  Patient tolerated treatment well    Behavior During Therapy  [facility] for tasks assessed/performed       Past Medical History:  Diagnosis Date  . Arthritis   . Hypertension     Past Surgical History:  Procedure Laterality Date  . BACK SURGERY    . CHOLECYSTECTOMY  2006  . IR GASTROSTOMY TUBE MOD SED  11/17/2016  . IR GASTROSTOMY TUBE REMOVAL  01/18/2017    There were no vitals filed for this visit.  Subjective Assessment - 01/21/17 1620    Subjective  Pt reports no changes, no falls.     Patient Stated Goals  "Walk on my own" without rolling walker outdoors.    Currently in Pain?  No/denies                      [facility] Adult PT Treatment/Exercise - 01/21/17 1620      Ambulation/Gait   Ambulation/Gait  Yes    Ambulation/Gait Assistance  4: Min guard;4: Min assist    Ambulation/Gait Assistance Details  Assessed gait with use of SPC (in both R and L hand).  Pt ambulated at min/guard to min A level but did tend to improve with faster gait speed due to decreased coordination when ambulating at slower gait speed.  She did continue to need intermittent assist esp with turns.  Had her ambulate during session in figure 8 pattern around hula hoops to address this with cues for stepping sequence for improved balance.       Ambulation Distance (Feet)  230 Feet    Assistive device  Straight cane    Gait Pattern  Wide base of support    Ambulation Surface  Level;Indoor      Self-Care   Self-Care  Other Self-Care Comments    Other Self-Care Comments   Discussed walking program to improve endurance.  Encouraged her to keep time so that she can increase slowly and recommend that she walk with walker at this time.  Pt verbalized understanding.       Neuro Re-ed    Neuro Re-ed Details   Went over current HEP with corner balance exercises, see pt instruction.  She is doing well with these and recommend she continue them.  Also performed corner balance on compliant surface with feet apart, EO x 2 sets of 20 secs, feet apart EO with head turns up/down and side/side x 10 reps each, and feet together EC x 2 sets of 20 secs.  Continued with high level balance and gait during session with ball toss during gait x 115', kicking bean bag x 115', moving ball in clockwise and counterclockwise motion while following with head/eyes x 115'.  Note that she needs intermittent min/guard for LOB, however she does demonstrate good stepping strategy to recover LOB.  Also had pt ambulate up to 500' while performing head turns when cued, speeding up/slowing down, making sudden stops and turns and backwards walking.  Again, needs min/guard to min A at times for intermittent LOB.  Ended session with gait x 50' x 2 reps with head turns diagonally (one 6550' in one direction and vice versa).  Tolerated all well but did need intermittent seated rest breaks due to fatigue.               PT Education - 01/22/17 0910    Education provided  Yes    Education Details  walking program with rollator    Person(s) Educated  Patient    Methods  Explanation    Comprehension  Verbalized understanding       PT Short Term Goals - 12/30/16 1330      PT SHORT TERM GOAL #1   Title  The patient will be indep with HEP for balance and general mobility.     Time  4    Period  Weeks    Target Date  01/29/17      PT SHORT TERM GOAL #2   Title  The patient will improve gait speed from 2.38 ft/sec to > or equal to 2.62 ft/sec to demo transition to "full community ambulator" classification of gait.    Time  4    Period  Weeks    Target Date  01/29/17      PT SHORT TERM GOAL #3   Title  The patient will improve TUG from 19.91 seconds to < or equal to 16 seconds to demo improving functional mobility.    Time  4    Period  Weeks    Target Date  01/29/17      PT SHORT TERM GOAL #4   Title  The patient will improve Berg from 39/56 to > or equal to 44/56 to demo dec'ing risk for falls.    Time  4    Period  Weeks    Target Date  01/29/17      PT SHORT TERM GOAL #5   Title  The patient will ambulate x 250 ft in the clinic without loss of balance to demo improved safety for household ambulation.    Time  4    Period  Weeks    Target Date  01/29/17        PT Long Term Goals - 12/30/16 1332      PT LONG TERM GOAL #1   Title  The patient will be indep with progression of HEP.    Time  8    Period  Weeks    Target Date  02/28/17      PT LONG TERM GOAL #2   Title  The patient will improve Berg from 39/56 to > or equal than 46/56 to demo dec'd fall risk.    Time  8    Period  Weeks    Target Date  02/28/17      PT LONG TERM GOAL #3   Title  The patient will improve TUG from 19.91 seconds to < or equal to 14 seconds to demo dec'ing risk for falls.    Time  8    Period  Weeks    Target Date  02/28/17      PT LONG TERM GOAL #4   Title  The  patinet will improve gait speed from 2.38 ft/sec to > or equal to 3.0 ft/sec to demo improving mobility.    Time  8    Period  Weeks    Target Date  02/28/17      PT LONG TERM GOAL #5   Title  The patient will improve functional status survey from 50% to > or equal to 62% to demo improving functional mobility.    Time  8    Period  Weeks    Target Date  02/28/17            Plan -  01/22/17 0911    Clinical Impression Statement  Skilled session reviewed current HEP for balance and also continued to address high level balance and gait deficits.  Pt continues to demonstrate unsteady gait and slower gait speed which tends to cause her to become unbalanced and needing stepping strategy to correct.     PT Treatment/Interventions  ADLs/Self Care Home Management;Balance training;Neuromuscular re-education;Patient/family education;Gait training;Stair training;Functional mobility training;Therapeutic activities;Therapeutic exercise;Manual techniques    PT Next Visit Plan  Dynamic gait activities emphasizing a more narrow base of support, direction changes, head motion, and turns (in functional context for home negotiation and during transitions).  Gait without rollater RW; balance training.    Consulted and Agree with Plan of Care  Patient       Patient will benefit from skilled therapeutic intervention in order to improve the following deficits and impairments:  Abnormal gait, Decreased balance, Decreased strength, Difficulty walking  Visit Diagnosis: Other abnormalities of gait and mobility  Unsteadiness on feet  Muscle weakness (generalized)     Problem List Patient Active Problem List   Diagnosis Date Noted  . Cerebellar infarction (HCC) 11/03/2016  . Acute ischemic stroke (HCC)   . Benign essential HTN   . Dysarthria, post-stroke   . Leukocytosis   . Acute blood loss anemia   . 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 Acute encephalopathy 10/28/2016  . 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 INSECT BITE 07/04/2009  . HYPERLIPIDEMIA 06/03/2007  . ALLERGIC RHINITIS 06/03/2007  . VAGINITIS, ATROPHIC 06/03/2007  . CONTACT DERMATITIS&OTH ECZEMA DUE OTH SPEC AGENT 06/03/2007  . COLONIC POLYPS, HX OF 06/03/2007    Harriet Butte, PT, MPT [facility] [address] [place], [place], [address] Phone: [phone]   Fax:  [phone] 01/22/17, 9:13 AM  Name: [name] MRN: [id number] Date of Birth: 12/12/1940

## 2017-01-23 LAB — COMPREHENSIVE METABOLIC PANEL
AG Ratio: 1.7 (calc) (ref 1.0–2.5)
ALT: 20 U/L (ref 6–29)
AST: 20 U/L (ref 10–35)
Albumin: 4.3 g/dL (ref 3.6–5.1)
Alkaline phosphatase (APISO): 131 U/L — ABNORMAL HIGH (ref 33–130)
BUN/Creatinine Ratio: 38 (calc) — ABNORMAL HIGH (ref 6–22)
BUN: 21 mg/dL (ref 7–25)
CO2: 28 mmol/L (ref 20–32)
Calcium: 9.7 mg/dL (ref 8.6–10.4)
Chloride: 105 mmol/L (ref 98–110)
Creat: 0.56 mg/dL — ABNORMAL LOW (ref 0.60–0.93)
Globulin: 2.6 g/dL (calc) (ref 1.9–3.7)
Glucose, Bld: 83 mg/dL (ref 65–99)
Potassium: 4.9 mmol/L (ref 3.5–5.3)
Sodium: 141 mmol/L (ref 135–146)
Total Bilirubin: 0.4 mg/dL (ref 0.2–1.2)
Total Protein: 6.9 g/dL (ref 6.1–8.1)

## 2017-01-23 LAB — CBC WITH DIFFERENTIAL/PLATELET
Basophils Absolute: 40 cells/uL (ref 0–200)
Basophils Relative: 0.5 %
Eosinophils Absolute: 142 cells/uL (ref 15–500)
Eosinophils Relative: 1.8 %
HCT: 34.2 % — ABNORMAL LOW (ref 35.0–45.0)
Hemoglobin: 11.3 g/dL — ABNORMAL LOW (ref 11.7–15.5)
Lymphs Abs: 1770 cells/uL (ref 850–3900)
MCH: 30.3 pg (ref 27.0–33.0)
MCHC: 33 g/dL (ref 32.0–36.0)
MCV: 91.7 fL (ref 80.0–100.0)
MPV: 10.1 fL (ref 7.5–12.5)
Monocytes Relative: 8.9 %
Neutro Abs: 5246 cells/uL (ref 1500–7800)
Neutrophils Relative %: 66.4 %
Platelets: 307 10*3/uL (ref 140–400)
RBC: 3.73 10*6/uL — ABNORMAL LOW (ref 3.80–5.10)
RDW: 12.9 % (ref 11.0–15.0)
Total Lymphocyte: 22.4 %
WBC mixed population: 703 cells/uL (ref 200–950)
WBC: 7.9 10*3/uL (ref 3.8–10.8)

## 2017-01-23 LAB — TSH: TSH: 4.6 mIU/L — ABNORMAL HIGH (ref 0.40–4.50)

## 2017-01-27 ENCOUNTER — Ambulatory Visit: Payer: Medicare Other | Admitting: Physical Therapy

## 2017-01-27 ENCOUNTER — Encounter: Payer: Self-pay | Admitting: Physical Therapy

## 2017-01-27 VITALS — BP 160/90

## 2017-01-27 DIAGNOSIS — R2689 Other abnormalities of gait and mobility: Secondary | ICD-10-CM

## 2017-01-27 DIAGNOSIS — R2681 Unsteadiness on feet: Secondary | ICD-10-CM

## 2017-01-27 DIAGNOSIS — M6281 Muscle weakness (generalized): Secondary | ICD-10-CM

## 2017-01-27 NOTE — Therapy (Signed)
[facility] [address] [place], [place], [address] Phone: [phone]   Fax:  [phone]  Physical Therapy Treatment  Patient Details  Name: [name] MRN: [id number] Date of Birth: [birth year] Referring Provider: Claudette Laws   Encounter Date: 01/27/2017  PT End of Session - 01/27/17 1259    Visit Number  4    Number of Visits  17 eval + 16 visits    Date for PT Re-Evaluation  02/28/17    PT Start Time  1100    PT Stop Time  1149    PT Time Calculation (min)  49 min    Activity Tolerance  Patient tolerated treatment well    Behavior During Therapy  [facility] for tasks assessed/performed       Past Medical History:  Diagnosis Date  . Allergic rhinitis 06/03/2007  . Arthritis   . Atrophic vaginitis   . 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 Conductive hearing loss of both ears   . CVA (cerebral vascular accident) (HCC)   . History of colon polyps   . HLD (hyperlipidemia)   . Hypertension   . Melanoma in situ [facility])     Past Surgical History:  Procedure Laterality Date  . BACK SURGERY    . CHOLECYSTECTOMY  2006  . IR GASTROSTOMY TUBE MOD SED  11/17/2016  . IR GASTROSTOMY TUBE REMOVAL  01/18/2017    Vitals:   01/27/17 1104  BP: (!) 160/90   No change in BP after gait training with cane.    Subjective Assessment - 01/27/17 1108    Subjective  BP elevated today; feels like it may be the drive over or the cold.  Not on any BP medications because BP has been pretty low since being in the hospital.  Cleaned her house yesterday, LE were a little tired.    Patient Stated Goals  "Walk on my own" without rolling walker outdoors.    Currently in Pain?  Yes    Pain Score  2     Pain Location  Hip    Pain Orientation  Right;Left    Pain Descriptors / Indicators  Sore                      [facility] Adult PT Treatment/Exercise - 01/27/17 1112      Ambulation/Gait   Ambulation/Gait  Yes     Ambulation/Gait Assistance  4: Min guard    Ambulation/Gait Assistance Details  Continued gait assessment and training with cane initially with quad cane (pt has one at home) and then with [facility] (can borrow one from friend); pt had more difficulty coordinating gait sequence with quad cane but felt less stable with SPC.  Performed gait with variety of challenges including negotiating L and R around cones, up/down ramp/curb and across compliant surface x 4 reps. second repetition pt required supervision and intermittent min A during lateral LOB during compliant surface and pivoting    Ambulation Distance (Feet)  250 Feet    Assistive device  Straight cane    Gait Pattern  Step-through pattern;Lateral hip instability    Ambulation Surface  Level;Unlevel;Indoor    Ramp  4: Min assist    Curb  4: Min assist    Curb Details (indicate cue type and reason)  cues for safe sequence when descending      Self-Care   Self-Care  Other Self-Care Comments    Other Self-Care Comments  Discussed BP monitoring at therapy visits and then possibly keeping a BP log at home to see if BP is trending upwards.          Balance Exercises - 01/27/17 1146      Balance Exercises: Standing   Other Standing Exercises  performed toe taps to cones x 4 reps while walking forwards - taps to L and R, taps across midline x 2 sets each; Side stepping to L and R x 2 sets, 4 reps while performing R and L toe tap to cone with light min A overall        PT Education - 01/27/17 1258    Education provided  Yes    Education Details  gait with cane    Person(s) Educated  Patient    Methods  Explanation;Demonstration    Comprehension  Need further instruction       PT Short Term Goals - 12/30/16 1330      PT SHORT TERM GOAL #1   Title  The patient will be indep with HEP for balance and general mobility.    Time  4    Period  Weeks    Target Date  01/29/17      PT SHORT TERM GOAL #2   Title  The patient will improve  gait speed from 2.38 ft/sec to > or equal to 2.62 ft/sec to demo transition to "full community ambulator" classification of gait.    Time  4    Period  Weeks    Target Date  01/29/17      PT SHORT TERM GOAL #3   Title  The patient will improve TUG from 19.91 seconds to < or equal to 16 seconds to demo improving functional mobility.    Time  4    Period  Weeks    Target Date  01/29/17      PT SHORT TERM GOAL #4   Title  The patient will improve Berg from 39/56 to > or equal to 44/56 to demo dec'ing risk for falls.    Time  4    Period  Weeks    Target Date  01/29/17      PT SHORT TERM GOAL #5   Title  The patient will ambulate x 250 ft in the clinic without loss of balance to demo improved safety for household ambulation.    Time  4    Period  Weeks    Target Date  01/29/17        PT Long Term Goals - 12/30/16 1332      PT LONG TERM GOAL #1   Title  The patient will be indep with progression of HEP.    Time  8    Period  Weeks    Target Date  02/28/17      PT LONG TERM GOAL #2   Title  The patient will improve Berg from 39/56 to > or equal than 46/56 to demo dec'd fall risk.    Time  8    Period  Weeks    Target Date  02/28/17      PT LONG TERM GOAL #3   Title  The patient will improve TUG from 19.91 seconds to < or equal to 14 seconds to demo dec'ing risk for falls.    Time  8    Period  Weeks    Target Date  02/28/17      PT LONG TERM GOAL #4   Title  The patinet will improve gait speed from 2.38 ft/sec to > or equal to 3.0 ft/sec to demo improving mobility.    Time  8    Period  Weeks    Target Date  02/28/17      PT LONG TERM GOAL #5   Title  The patient will improve functional status survey from 50% to > or equal to 62% to demo improving functional mobility.    Time  8    Period  Weeks    Target Date  02/28/17            Plan - 01/27/17 1149    Clinical Impression Statement  Continued to focus on higher level gait and balance training with SPC  over variety of surfaces and negotiating various obstacles.  Pt had greatest difficulty maintaining balance on compliant surfaces, changes in direction and side stepping.  Will continue to address and progress towards LTG.    PT Treatment/Interventions  ADLs/Self Care Home Management;Balance training;Neuromuscular re-education;Patient/family education;Gait training;Stair training;Functional mobility training;Therapeutic activities;Therapeutic exercise;Manual techniques    PT Next Visit Plan  CHECK STG; continue gait train with SPC - taps to cones, side stepping, retro stepping, various surfaces/obstacles; Dynamic gait activities emphasizing a more narrow base of support, direction changes, head motion, and turns (in functional context for home negotiation and during transitions).  Gait without rollater RW; balance training.    Consulted and Agree with Plan of Care  Patient       Patient will benefit from skilled therapeutic intervention in order to improve the following deficits and impairments:  Abnormal gait, Decreased balance, Decreased strength, Difficulty walking  Visit Diagnosis: Other abnormalities of gait and mobility  Unsteadiness on feet  Muscle weakness (generalized)     Problem List Patient Active Problem List   Diagnosis Date Noted  . Status post stroke 01/22/2017  . Anemia 01/22/2017  . Cerebellar infarction (HCC) 11/03/2016  . Acute ischemic stroke (HCC)   . Benign essential HTN   . Dysarthria, post-stroke   . Leukocytosis   . Acute blood loss anemia   . 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 Mixed hyperlipidemia 06/03/2007  . Allergic rhinitis 06/03/2007  . VAGINITIS, ATROPHIC 06/03/2007   Dierdre HighmanAudra F Tinley Rought, PT, DPT 01/27/17    1:02 PM    [facility] [address] [place], [place], [address] Phone: [phone]   Fax:   [phone]  Name: [name] MRN: [id number] Date of Birth: 06/22/1940

## 2017-01-28 ENCOUNTER — Encounter: Payer: Self-pay | Admitting: Rehabilitation

## 2017-01-28 ENCOUNTER — Ambulatory Visit: Payer: Medicare Other | Admitting: Neurology

## 2017-01-28 ENCOUNTER — Ambulatory Visit: Payer: Medicare Other | Admitting: Rehabilitation

## 2017-01-28 ENCOUNTER — Encounter: Payer: Medicare Other | Admitting: Speech Pathology

## 2017-01-28 ENCOUNTER — Encounter: Payer: Self-pay | Admitting: Neurology

## 2017-01-28 VITALS — BP 167/86

## 2017-01-28 VITALS — BP 155/90 | HR 93 | Wt 130.6 lb

## 2017-01-28 DIAGNOSIS — R2689 Other abnormalities of gait and mobility: Secondary | ICD-10-CM

## 2017-01-28 DIAGNOSIS — I63012 Cerebral infarction due to thrombosis of left vertebral artery: Secondary | ICD-10-CM

## 2017-01-28 DIAGNOSIS — M6281 Muscle weakness (generalized): Secondary | ICD-10-CM

## 2017-01-28 DIAGNOSIS — R2681 Unsteadiness on feet: Secondary | ICD-10-CM

## 2017-01-28 NOTE — Patient Instructions (Signed)
I had a long d/w patient about her recent stroke,leftt vertebral artery occlusion, risk for recurrent stroke/TIAs, personally independently reviewed imaging studies and stroke evaluation results and answered questions.Discontinue aspirin 325 mg daily and stay on  clopidogrel 75 mg daily  for secondary stroke prevention and maintain strict control of hypertension with blood pressure goal below 130/90, diabetes with hemoglobin A1c goal below 6.5% and lipids with LDL cholesterol goal below 70 mg/dL. I also advised the patient to eat a healthy diet with plenty of whole grains, cereals, fruits and vegetables, exercise regularly and maintain ideal body weight. She was advised to use a cane or walker at all times for fall and safety precautions. Followup in the future with my nurse practitioner in 6 months or call earlier if necessary   Stroke Prevention Some medical conditions and behaviors are associated with a higher chance of having a stroke. You can help prevent a stroke by making nutrition, lifestyle, and other changes, including managing any medical conditions you may have. What nutrition changes can be made?  Eat healthy foods. You can do this by: ? Choosing foods high in fiber, such as fresh fruits and vegetables and whole grains. ? Eating at least 5 or more servings of fruits and vegetables a day. Try to fill half of your plate at each meal with fruits and vegetables. ? Choosing lean protein foods, such as lean cuts of meat, poultry without skin, fish, tofu, beans, and nuts. ? Eating low-fat dairy products. ? Avoiding foods that are high in salt (sodium). This can help lower blood pressure. ? Avoiding foods that have saturated fat, trans fat, and cholesterol. This can help prevent high cholesterol. ? Avoiding processed and premade foods.  Follow your health care provider's specific guidelines for losing weight, controlling high blood pressure (hypertension), lowering high cholesterol, and managing  diabetes. These may include: ? Reducing your daily calorie intake. ? Limiting your daily sodium intake to 1,500 milligrams (mg). ? Using only healthy fats for cooking, such as olive oil, canola oil, or sunflower oil. ? Counting your daily carbohydrate intake. What lifestyle changes can be made?  Maintain a healthy weight. Talk to your health care provider about your ideal weight.  Get at least 30 minutes of moderate physical activity at least 5 days a week. Moderate activity includes brisk walking, biking, and swimming.  Do not use any products that contain nicotine or tobacco, such as cigarettes and e-cigarettes. If you need help quitting, ask your health care provider. It may also be helpful to avoid exposure to secondhand smoke.  Limit alcohol intake to no more than 1 drink a day for nonpregnant women and 2 drinks a day for men. One drink equals 12 oz of beer, 5 oz of wine, or 1 oz of hard liquor.  Stop any illegal drug use.  Avoid taking birth control pills. Talk to your health care provider about the risks of taking birth control pills if: ? You are over 75 years old. ? You smoke. ? You get migraines. ? You have ever had a blood clot. What other changes can be made?  Manage your cholesterol levels. ? Eating a healthy diet is important for preventing high cholesterol. If cholesterol cannot be managed through diet alone, you may also need to take medicines. ? Take any prescribed medicines to control your cholesterol as told by your health care provider.  Manage your diabetes. ? Eating a healthy diet and exercising regularly are important parts of managing your blood sugar.  If your blood sugar cannot be managed through diet and exercise, you may need to take medicines. ? Take any prescribed medicines to control your diabetes as told by your health care provider.  Control your hypertension. ? To reduce your risk of stroke, try to keep your blood pressure below 130/80. ? Eating a  healthy diet and exercising regularly are an important part of controlling your blood pressure. If your blood pressure cannot be managed through diet and exercise, you may need to take medicines. ? Take any prescribed medicines to control hypertension as told by your health care provider. ? Ask your health care provider if you should monitor your blood pressure at home. ? Have your blood pressure checked every year, even if your blood pressure is normal. Blood pressure increases with age and some medical conditions.  Get evaluated for sleep disorders (sleep apnea). Talk to your health care provider about getting a sleep evaluation if you snore a lot or have excessive sleepiness.  Take over-the-counter and prescription medicines only as told by your health care provider. Aspirin or blood thinners (antiplatelets or anticoagulants) may be recommended to reduce your risk of forming blood clots that can lead to stroke.  Make sure that any other medical conditions you have, such as atrial fibrillation or atherosclerosis, are managed. What are the warning signs of a stroke? The warning signs of a stroke can be easily remembered as BEFAST.  B is for balance. Signs include: ? Dizziness. ? Loss of balance or coordination. ? Sudden trouble walking.  E is for eyes. Signs include: ? A sudden change in vision. ? Trouble seeing.  F is for face. Signs include: ? Sudden weakness or numbness of the face. ? The face or eyelid drooping to one side.  A is for arms. Signs include: ? Sudden weakness or numbness of the arm, usually on one side of the body.  S is for speech. Signs include: ? Trouble speaking (aphasia). ? Trouble understanding.  T is for time. ? These symptoms may represent a serious problem that is an emergency. Do not wait to see if the symptoms will go away. Get medical help right away. Call your local emergency services (911 in the [place] Do not drive yourself to the hospital.  Other  signs of stroke may include: ? A sudden, severe headache with no known cause. ? Nausea or vomiting. ? Seizure.  Where to find more information: For more information, visit:  American Stroke Association: www.strokeassociation.org  National Stroke Association: www.stroke.org  Summary  You can prevent a stroke by eating healthy, exercising, not smoking, limiting alcohol intake, and managing any medical conditions you may have.  Do not use any products that contain nicotine or tobacco, such as cigarettes and e-cigarettes. If you need help quitting, ask your health care provider. It may also be helpful to avoid exposure to secondhand smoke.  Remember BEFAST for warning signs of stroke. Get help right away if you or a loved one has any of these signs. This information is not intended to replace advice given to you by your health care provider. Make sure you discuss any questions you have with your health care provider. Document Released: 02/06/2004 Document Revised: 02/04/2016 Document Reviewed: 02/04/2016 Elsevier Interactive Patient Education  [employer].

## 2017-01-28 NOTE — Therapy (Signed)
[facility] [address] [place], [place], [address] Phone: [phone]   Fax:  [phone]  Physical Therapy Treatment  Patient Details  Name: [name] MRN: [id number] Date of Birth: 1940/08/21 Referring Provider: Alysia Penna   Encounter Date: 01/28/2017  PT End of Session - 01/28/17 1039    Visit Number  5    Number of Visits  17 eval + 16 visits    Date for PT Re-Evaluation  02/28/17    PT Start Time  0932    PT Stop Time  1015    PT Time Calculation (min)  43 min    Activity Tolerance  Patient tolerated treatment well    Behavior During Therapy  [facility] for tasks assessed/performed       Past Medical History:  Diagnosis Date  . Allergic rhinitis 06/03/2007  . Arthritis   . Atrophic vaginitis   . 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 Conductive hearing loss of both ears   . CVA (cerebral vascular accident) ([place])   . History of colon polyps   . HLD (hyperlipidemia)   . Hypertension   . Melanoma in situ [facility])     Past Surgical History:  Procedure Laterality Date  . BACK SURGERY    . CHOLECYSTECTOMY  2006  . IR GASTROSTOMY TUBE MOD SED  11/17/2016  . IR GASTROSTOMY TUBE REMOVAL  01/18/2017    Vitals:   01/28/17 0938  BP: (!) 167/86    Subjective Assessment - 01/28/17 0934    Subjective  Reports she moved some furniture yesterday, no other changes.      Patient Stated Goals  "Walk on my own" without rolling walker outdoors.    Currently in Pain?  No/denies                      [facility] Adult PT Treatment/Exercise - 01/28/17 0955      Ambulation/Gait   Ambulation/Gait  Yes    Ambulation/Gait Assistance  4: Min guard;5: Supervision    Ambulation/Gait Assistance Details  Continue to work on gait quality with SPC.  Note marked improvement today vs previous sessions.  She was able to ambulate at S level with mild veering to the L, however not as prominent as previous  session.      Gait velocity  2.46 ft/sec with cane, 2.71 ft/sec with rollator    Gait Comments  Also initiated gait on simulated outdoor surfaces by ambulating over red and blue therpay mats.  Pt able to ambulate at mostly S level, however did have single LOB in which she utilized stepping strategy (but crossed LEs) and needed min A to correct.  Educated how this simulates outdoor surfaces.  Will continue to work on this in future sessions.  Pt verbalized understanding.        Standardized Balance Assessment   Standardized Balance Assessment  Berg Balance Test;Timed Up and Go Test      Berg Balance Test   Sit to Stand  Able to stand without using hands and stabilize independently    Standing Unsupported  Able to stand safely 2 minutes    Sitting with Back Unsupported but Feet Supported on Floor or Stool  Able to sit safely and securely 2 minutes    Stand to Sit  Sits safely with minimal use of hands    Transfers  Able to transfer safely, minor use of hands    Standing  Unsupported with Eyes Closed  Able to stand 10 seconds with supervision    Standing Ubsupported with Feet Together  Able to place feet together independently and stand 1 minute safely    From Standing, Reach Forward with Outstretched Arm  Can reach forward >5 cm safely (2")    From Standing Position, Pick up Object from [place] to pick up shoe safely and easily    From Standing Position, Turn to Look Behind Over each Shoulder  Looks behind one side only/other side shows less weight shift    Turn 360 Degrees  Able to turn 360 degrees safely but slowly    Standing Unsupported, Alternately Place Feet on Step/Stool  Able to stand independently and safely and complete 8 steps in 20 seconds    Standing Unsupported, One Foot in Front  Able to plae foot ahead of the other independently and hold 30 seconds    Standing on One Leg  Tries to lift leg/unable to hold 3 seconds but remains standing independently    Total Score  46       Timed Up and Go Test   TUG  Normal TUG    Normal TUG (seconds)  15.69 with cane, 13.50 secs with rollator          Balance Exercises - 01/27/17 1146      Balance Exercises: Standing   Other Standing Exercises  performed toe taps to cones x 4 reps while walking forwards - taps to L and R, taps across midline x 2 sets each; Side stepping to L and R x 2 sets, 4 reps while performing R and L toe tap to cone with light min A overall        PT Education - 01/28/17 1036    Education provided  Yes    Education Details  Recommend she speak with MD today regarding elevated BP last two sessions.  Educated on STG status.     Person(s) Educated  Patient    Methods  Explanation    Comprehension  Verbalized understanding       PT Short Term Goals - 01/28/17 0939      PT SHORT TERM GOAL #1   Title  The patient will be indep with HEP for balance and general mobility.    Baseline  met 01/28/17 (did them 2 sessions ago, verbalizes she is independent at home)    Time  4    Period  Weeks    Status  Achieved      PT SHORT TERM GOAL #2   Title  The patient will improve gait speed from 2.38 ft/sec to > or equal to 2.62 ft/sec to demo transition to "full community ambulator" classification of gait.    Baseline  2.71 ft/sec with rollator and 2.46 ft/sec with cane    Time  4    Period  Weeks    Status  Achieved      PT SHORT TERM GOAL #3   Title  The patient will improve TUG from 19.91 seconds to < or equal to 16 seconds to demo improving functional mobility.    Baseline  15.69 with cane, 13.50 with rollator    Time  4    Period  Weeks    Status  Achieved      PT SHORT TERM GOAL #4   Title  The patient will improve Berg from 39/56 to > or equal to 44/56 to demo dec'ing risk for falls.  Baseline  46/56 on 01/28/17    Time  4    Period  Weeks    Status  Achieved      PT SHORT TERM GOAL #5   Title  The patient will ambulate x 250 ft in the clinic without loss of balance to demo improved  safety for household ambulation.    Baseline  met with rollator     Time  4    Period  Weeks    Status  Achieved        PT Long Term Goals - 12/30/16 1332      PT LONG TERM GOAL #1   Title  The patient will be indep with progression of HEP.    Time  8    Period  Weeks    Target Date  02/28/17      PT LONG TERM GOAL #2   Title  The patient will improve Berg from 39/56 to > or equal than 46/56 to demo dec'd fall risk.    Time  8    Period  Weeks    Target Date  02/28/17      PT LONG TERM GOAL #3   Title  The patient will improve TUG from 19.91 seconds to < or equal to 14 seconds to demo dec'ing risk for falls.    Time  8    Period  Weeks    Target Date  02/28/17      PT LONG TERM GOAL #4   Title  The patinet will improve gait speed from 2.38 ft/sec to > or equal to 3.0 ft/sec to demo improving mobility.    Time  8    Period  Weeks    Target Date  02/28/17      PT LONG TERM GOAL #5   Title  The patient will improve functional status survey from 50% to > or equal to 62% to demo improving functional mobility.    Time  8    Period  Weeks    Target Date  02/28/17            Plan - 01/28/17 1039    Clinical Impression Statement  Skilled session focused on assessment of STGs.  Pt has met 5/5 STGs and making excellent progress towards LTGs.  She is also making good progress with use of SPC during gait, noting more confidence with using SPC.  Recommend she use cane at home to improve quality and confidence and moving towards outdoor/more challenging gait with cane.     PT Treatment/Interventions  ADLs/Self Care Home Management;Balance training;Neuromuscular re-education;Patient/family education;Gait training;Stair training;Functional mobility training;Therapeutic activities;Therapeutic exercise;Manual techniques    PT Next Visit Plan   continue gait train with SPC - taps to cones, side stepping, retro stepping, various surfaces/obstacles; Dynamic gait activities emphasizing a  more narrow base of support, direction changes, head motion, and turns (in functional context for home negotiation and during transitions).  Gait without rollater RW; balance training.    Consulted and Agree with Plan of Care  Patient       Patient will benefit from skilled therapeutic intervention in order to improve the following deficits and impairments:  Abnormal gait, Decreased balance, Decreased strength, Difficulty walking  Visit Diagnosis: Other abnormalities of gait and mobility  Unsteadiness on feet  Muscle weakness (generalized)     Problem List Patient Active Problem List   Diagnosis Date Noted  . Status post stroke 01/22/2017  . Anemia 01/22/2017  . Cerebellar infarction (  [place]) 11/03/2016  . Acute ischemic stroke ([place])   . Benign essential HTN   . Dysarthria, post-stroke   . Leukocytosis   . Acute blood loss anemia   . 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 Mixed hyperlipidemia 06/03/2007  . Allergic rhinitis 06/03/2007  . VAGINITIS, ATROPHIC 06/03/2007    Cameron Sprang, PT, MPT [facility] [address] [place], [place], [address] Phone: [phone]   Fax:  [phone] 01/28/17, 10:43 AM  Name: [name] MRN: [id number] Date of Birth: 1940-11-08

## 2017-01-28 NOTE — Progress Notes (Signed)
[facility] [address] [place] [id number] [phone]       OFFICE FOLLOW-UP NOTE  [name] Date of Birth:  12/13/1940 Medical Record Number:  [id number]   HPI: [name] is a pleasant 77 year old Caucasian lady seen today for the first office follow-up visit following hospital admission for stroke in October 2018. History is obtained from the patient and review of electronic medical records. I have personally reviewed imaging films.Donna Stacksnna Kay Sears Murrayis an 77 y.o.femalepresenting [facility] the [facility] after a short syncopal spell at church, which was followed by gradually worsening confusion and agitation. She had been walking out of church, when the symptoms began. Prior to her syncopal episode, she stated that she felt dizzy. She was noted to be hot and clammy, then vomited. Zofran 4 mg was administered en route. In the ED a Code Stroke was called for acute confusion. No facial droop or limb weakness was noted. Patient was not administered IV t-PA secondary to initial presentation was more concerning for encephalopathy than acute infarct. CT scan of the head on admission showed no acute abnormality. CT angiogram showed age indeterminate occlusion of the left vertebral artery at the V1-32 junction was otherwise unremarkable. MRI scan of the brain showed several small areas of acute infarcts involving left middle and scattered involvement of the left greater than right mid to inferior cerebellar hemispheres as well as vermis. 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 Transthoracic echo showed normal ejection fraction without cardiac source of embolism. LDL cholesterol was elevated 134 mg percent and hemoglobin A1c was 5.8%. Cardiac monitoring during the hospitalization showed no arrhythmias. Patient had mild left finger-to-nose dysmetria and hoarseness of voice and dysphagia. Patient was started on dual antiplatelet therapy. She required a PEG tube. Patient  says she'll conversant discharge. Her dysphagia has improved and feeding tube has come out. Her voice is improved as well. She still has some intermittent numbness in her hands. She is currently part sparing in outpatient physical and speech therapy but occupational therapy has discharged her. She uses a wheeled walker for long distances but can use a cane for short distances. She states her balance is improving and she has had no recent falls. She does complain of increased bruising on aspirin and Plavix. She has no new complaints. She denied any significant head or neck injury or fall , whiplash or motor vehicle accident or chiropractic manipulation parietal stroke   ROS:   14 system review of systems is positive for dizziness, imbalance, numbness, dysphagia and all other systems negative  PMH:  Past Medical History:  Diagnosis Date  . Allergic rhinitis 06/03/2007  . Arthritis   . Atrophic vaginitis   . 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 CVA (cerebral vascular accident) ([facility])   . History of colon polyps   . HLD (hyperlipidemia)   . Hypertension   . Melanoma in situ [facility](HCC)     Social History:  Social History   Socioeconomic History  . Marital status: Widowed    Spouse name: Not on file  . Number of children: Not on file  . Years of education: Not on file  . Highest education level: Not on file  Social Needs  . Financial resource strain: Not on file  . Food insecurity - worry: Not on file  . Food insecurity - inability: Not on file  . Transportation needs - medical: Not on file  . Transportation needs - non-medical:  Not on file  Occupational History  . Not on file  Tobacco Use  . Smoking status: Never Smoker  . Smokeless tobacco: Never Used  Substance and Sexual Activity  . Alcohol use: No  . Drug use: No  . Sexual activity: No  Other Topics Concern  . Not on file  Social History Narrative   ** Merged History Encounter **        Medications:   Current  Outpatient Medications on File Prior to Visit  Medication Sig Dispense Refill  . aspirin 325 MG tablet Place 1 tablet (325 mg total) daily into feeding tube. (Patient taking differently: Take 325 mg by mouth daily. )    . atorvastatin (LIPITOR) 40 MG tablet Take 1 tablet (40 mg total) by mouth daily at 6 PM. 90 tablet 0  . cefdinir (OMNICEF) 300 MG capsule     . clopidogrel (PLAVIX) 75 MG tablet Take 1 tablet (75 mg total) by mouth daily. 90 tablet 0  . sertraline (ZOLOFT) 50 MG tablet Take by mouth daily 90 tablet 0   No current facility-administered medications on file prior to visit.     Allergies:   Allergies  Allergen Reactions  . Lisinopril Other (See Comments)    Dizziness and felt odd  . Lisinopril     Dizziness per merged chart     Physical Exam General: well developed, well nourished pleasant elderly Caucasian lady, seated, in no evident distress Head: head normocephalic and atraumatic.  Neck: supple with no carotid or supraclavicular bruits Cardiovascular: regular rate and rhythm, no murmurs Musculoskeletal: no deformity Skin:  no rash/petichiae Vascular:  Normal pulses all extremities Vitals:   01/28/17 1056  BP: (!) 155/90  Pulse: 93   Neurologic Exam Mental Status: Awake and fully alert. Oriented to place and time. Recent and remote memory intact. Attention span, concentration and fund of knowledge appropriate. Mood and affect appropriate.  Cranial Nerves: Fundoscopic exam reveals sharp disc margins. Pupils equal, briskly reactive to light. Extraocular movements full without nystagmus. But do shows saccadic dysmetria more on left gaze than right. Visual fields full to confrontation. Hearing intact. Facial sensation intact. Face, tongue, palate moves normally and symmetrically.  Motor: Normal bulk and tone. Normal strength in all tested extremity muscles. Sensory.: intact to touch ,pinprick .position and vibratory sensation.  Coordination: Rapid alternating  movements normal in all extremities. Finger-to-nose and heel-to-shin performed accurately bilaterally. Gait and Station: Arises from chair without difficulty. Stance is slightly broad-based.. Gait demonstrates mild imbalance . Able to heel, toe and tandem walk without difficulty.  Reflexes: 1+ and symmetric. Toes downgoing.   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 Vascular risk factors of hypertension hyperlipidemia and interpreted atherosclerosis    PLAN: I had a long d/w patient about her recent stroke,leftt vertebral artery occlusion, risk for recurrent stroke/TIAs, personally independently reviewed imaging studies and stroke evaluation results and answered questions.Discontinue aspirin 325 mg daily and stay on  clopidogrel 75 mg daily  for secondary stroke prevention and maintain strict control of hypertension with blood pressure goal below 130/90, diabetes with hemoglobin A1c goal below 6.5% and lipids with LDL cholesterol goal below 70 mg/dL. I also advised the patient to eat a healthy diet with plenty of whole grains, cereals, fruits and vegetables, exercise regularly and maintain ideal body weight. She was advised to use a cane or walker at all times for  fall and safety precautions. Followup in the future with my nurse practitioner in 6 months or call earlier if necessary Greater than 50% of time during this 25 minute visit was spent on counseling,explanation of diagnosis left vertebral artery occlusion and brainstem stroke, planning of further management, discussion with patient and family and coordination of care Delia Heady, MD  [facility] [address] [place], [place] [address]  Phone [phone] Fax [phone] Note: This document was prepared with digital dictation and possible smart phrase technology. Any transcriptional errors that  result from this process are unintentional

## 2017-01-29 ENCOUNTER — Telehealth: Payer: Self-pay | Admitting: Family Medicine

## 2017-01-29 NOTE — Telephone Encounter (Signed)
See note

## 2017-01-29 NOTE — Telephone Encounter (Signed)
Copied from CRM [phone]. Topic: Quick Communication - Rx Refill/Question >> Jan 29, 2017  3:31 PM Rudi CocoLathan, Camelle Henkels M, [place] wrote: Medication: freestyle glucose sensor    Has the patient contacted their pharmacy? Yes ( [facility] needs to verify day supply and directions)   (Agent: If no, request that the patient contact the pharmacy for the refill.)   Preferred Pharmacy (with phone number or street name): [facility] [id number] - [place] - [id number] [facility] [address] [facility] [address] [place] [address] Phone: [phone] Fax: [phone]     Agent: Please be advised that RX refills may take up to 3 business days. We ask that you follow-up with your pharmacy.

## 2017-02-01 NOTE — Telephone Encounter (Signed)
Do not see where it is ordered for patient.

## 2017-02-02 NOTE — Telephone Encounter (Signed)
Patient states she didnt request this, states something was sent to pharmacy for DM and she is not a diabetic. Call back [phone]

## 2017-02-02 NOTE — Telephone Encounter (Signed)
Please be advised of the note below.  °

## 2017-02-03 ENCOUNTER — Ambulatory Visit: Payer: Medicare Other | Admitting: Rehabilitative and Restorative Service Providers"

## 2017-02-03 ENCOUNTER — Encounter: Payer: Self-pay | Admitting: Rehabilitative and Restorative Service Providers"

## 2017-02-03 ENCOUNTER — Ambulatory Visit: Payer: Medicare Other

## 2017-02-03 VITALS — BP 162/90

## 2017-02-03 DIAGNOSIS — M6281 Muscle weakness (generalized): Secondary | ICD-10-CM

## 2017-02-03 DIAGNOSIS — R1312 Dysphagia, oropharyngeal phase: Secondary | ICD-10-CM

## 2017-02-03 DIAGNOSIS — R2689 Other abnormalities of gait and mobility: Secondary | ICD-10-CM | POA: Diagnosis not present

## 2017-02-03 DIAGNOSIS — R29818 Other symptoms and signs involving the nervous system: Secondary | ICD-10-CM

## 2017-02-03 DIAGNOSIS — R2681 Unsteadiness on feet: Secondary | ICD-10-CM

## 2017-02-03 NOTE — Telephone Encounter (Signed)
Order was placed by mistake should have been cancelled.

## 2017-02-03 NOTE — Therapy (Signed)
[facility] [address] [place], [place], [address] Phone: [phone]   Fax:  [phone]  Physical Therapy Treatment  Patient Details  Name: [name] MRN: [id number] Date of Birth: [birth year] Referring Provider: Alysia Penna   Encounter Date: 02/03/2017  PT End of Session - 02/03/17 1350    Visit Number  6    Number of Visits  17 eval + 16 visits    Date for PT Re-Evaluation  02/28/17    PT Start Time  1240    PT Stop Time  1320    PT Time Calculation (min)  40 min    Activity Tolerance  Patient tolerated treatment well    Behavior During Therapy  [facility] for tasks assessed/performed       Past Medical History:  Diagnosis Date  . Allergic rhinitis 06/03/2007  . Arthritis   . Atrophic vaginitis   . 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 CVA (cerebral vascular accident) ([place])   . History of colon polyps   . HLD (hyperlipidemia)   . Hypertension   . Melanoma in situ [facility])     Past Surgical History:  Procedure Laterality Date  . BACK SURGERY    . CHOLECYSTECTOMY  2006  . IR GASTROSTOMY TUBE MOD SED  11/17/2016  . IR GASTROSTOMY TUBE REMOVAL  01/18/2017    Vitals:   02/03/17 1242  BP: (!) 162/90    Subjective Assessment - 02/03/17 1242    Subjective  The patient reports she walked down to the dumpster with her cane (no curb, has a small curb cutout and a small downhill).  She reports in her house, she is not using a device at all.  She notes imbalance occasionally if she turns too fast.  "I'm still having trouble with my neck."    Patient Stated Goals  "Walk on my own" without rolling walker outdoors.    Currently in Pain?  Yes    Pain Score  -- "stiffness" "I don't consider it pain, just an aggravation:    Aggravating Factors   tightness to the left         [facility] PT Assessment - 02/03/17 1250      Ambulation/Gait   Ambulation/Gait Assistance  4: Min guard;5: Supervision    Ambulation/Gait Assistance Details  The patient ambulated with rollater RW mod indep, with SPC with supervision without loss of balance (has minor veering from midline and then self corrects) x 400 feet.  Gait without device with close suppervision with occasional veering that she can self correct for on level surfaces x 400 ft.    Assistive device  None;Straight cane;Rollator    Ambulation Surface  Level;Unlevel;Indoor    Gait Comments  Dynamic gait activities walking on ramp with compliant mat with CGA to min A (R ankle instability noted); toe walking near support surface with min A; backward walking with UE support and min A.                  [facility] Adult PT Treatment/Exercise - 02/03/17 1250      Ambulation/Gait   Ambulation/Gait  Yes      Neuro Re-ed    Neuro Re-ed Details   Marching in place on compliant surface on ramp facing up/down and standing laterally.  Compliant surface standing working on alternating foot taps.  Marching and wide/narrow stepping, + 1/4 turns in agility ladder with min A.  180 degree turns with  gait working on spotting objects.        Exercises   Exercises  Other Exercises    Other Exercises   "my legs feel weak", "I want to do more exercises for home".  Patient demo'd heel raises *PT recommended she perform with more narrow stance and dec'ing UE support.  She has been doing squats since [facility] PT program and we modified to decrease UE use and perform deeper squat.        Braiding with CGA for support.     PT Short Term Goals - 01/28/17 1610      PT SHORT TERM GOAL #1   Title  The patient will be indep with HEP for balance and general mobility.    Baseline  met 01/28/17 (did them 2 sessions ago, verbalizes she is independent at home)    Time  4    Period  Weeks    Status  Achieved      PT SHORT TERM GOAL #2   Title  The patient will improve gait speed from 2.38 ft/sec to > or equal to 2.62 ft/sec to demo transition to "full community ambulator"  classification of gait.    Baseline  2.71 ft/sec with rollator and 2.46 ft/sec with cane    Time  4    Period  Weeks    Status  Achieved      PT SHORT TERM GOAL #3   Title  The patient will improve TUG from 19.91 seconds to < or equal to 16 seconds to demo improving functional mobility.    Baseline  15.69 with cane, 13.50 with rollator    Time  4    Period  Weeks    Status  Achieved      PT SHORT TERM GOAL #4   Title  The patient will improve Berg from 39/56 to > or equal to 44/56 to demo dec'ing risk for falls.    Baseline  46/56 on 01/28/17    Time  4    Period  Weeks    Status  Achieved      PT SHORT TERM GOAL #5   Title  The patient will ambulate x 250 ft in the clinic without loss of balance to demo improved safety for household ambulation.    Baseline  met with rollator     Time  4    Period  Weeks    Status  Achieved        PT Long Term Goals - 12/30/16 1332      PT LONG TERM GOAL #1   Title  The patient will be indep with progression of HEP.    Time  8    Period  Weeks    Target Date  02/28/17      PT LONG TERM GOAL #2   Title  The patient will improve Berg from 39/56 to > or equal than 46/56 to demo dec'd fall risk.    Time  8    Period  Weeks    Target Date  02/28/17      PT LONG TERM GOAL #3   Title  The patient will improve TUG from 19.91 seconds to < or equal to 14 seconds to demo dec'ing risk for falls.    Time  8    Period  Weeks    Target Date  02/28/17      PT LONG TERM GOAL #4   Title  The patinet will improve gait speed from  2.38 ft/sec to > or equal to 3.0 ft/sec to demo improving mobility.    Time  8    Period  Weeks    Target Date  02/28/17      PT LONG TERM GOAL #5   Title  The patient will improve functional status survey from 50% to > or equal to 62% to demo improving functional mobility.    Time  8    Period  Weeks    Target Date  02/28/17            Plan - 02/03/17 1357    Clinical Impression Statement  The patient showed  progress this week with SPC use.  She is not using a device in home and using cane or walker for short distances in community.  *PT discussed still having some loss of balance with cane, however patient notes no obstacles, curbs in path when taking trash (small bag) to dumpster.  She notes she feels safe performing with cane.  Patient would like more challenging strengthening program with goal to return to community exercise class.     PT Treatment/Interventions  ADLs/Self Care Home Management;Balance training;Neuromuscular re-education;Patient/family education;Gait training;Stair training;Functional mobility training;Therapeutic activities;Therapeutic exercise;Manual techniques    PT Next Visit Plan   continue gait train with SPC - taps to cones, side stepping, retro stepping, various surfaces/obstacles; Dynamic gait activities emphasizing a more narrow base of support, direction changes, head motion, and turns (in functional context for home negotiation and during transitions).  Gait without rollater RW; balance training.    Consulted and Agree with Plan of Care  Patient       Patient will benefit from skilled therapeutic intervention in order to improve the following deficits and impairments:  Abnormal gait, Decreased balance, Decreased strength, Difficulty walking  Visit Diagnosis: Other abnormalities of gait and mobility  Unsteadiness on feet  Muscle weakness (generalized)  Other symptoms and signs involving the nervous system     Problem List Patient Active Problem List   Diagnosis Date Noted  . Status post stroke 01/22/2017  . Anemia 01/22/2017  . 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 Acute ischemic stroke ([place])   . Benign essential HTN   . Dysarthria, post-stroke   . Leukocytosis   . Acute blood loss anemia   . 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 Mixed hyperlipidemia 06/03/2007  . Allergic rhinitis  06/03/2007  . VAGINITIS, ATROPHIC 06/03/2007    Camara Renstrom, PT 02/03/2017, 1:59 PM  [facility] [address] [place], [place], [address] Phone: [phone]   Fax:  [phone]  Name: [name] MRN: [id number] Date of Birth: [birth year]

## 2017-02-03 NOTE — Therapy (Signed)
[facility] [address] [place], [place], [address] Phone: [phone]   Fax:  [phone]  Speech Language Pathology Treatment  Patient Details  Name: Donna LoopNNA KAY SEARS Meroney MRN: [id number] Date of Birth: 11/12/1940 Referring Provider: Claudette LawsKirsteins, Andrew, MD   Encounter Date: 02/03/2017  End of Session - 02/03/17 1401    Visit Number  4    Number of Visits  17    Date for SLP Re-Evaluation  03/05/17    SLP Start Time  1318    SLP Stop Time   1349    SLP Time Calculation (min)  31 min    Activity Tolerance  Patient tolerated treatment well       Past Medical History:  Diagnosis Date  . Allergic rhinitis 06/03/2007  . Arthritis   . Atrophic vaginitis   . 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 CVA (cerebral vascular accident) ([facility])   . History of colon polyps   . HLD (hyperlipidemia)   . Hypertension   . Melanoma in situ [facility](HCC)     Past Surgical History:  Procedure Laterality Date  . BACK SURGERY    . CHOLECYSTECTOMY  2006  . IR GASTROSTOMY TUBE MOD SED  11/17/2016  . IR GASTROSTOMY TUBE REMOVAL  01/18/2017    There were no vitals filed for this visit.  Subjective Assessment - 02/03/17 1325    Subjective  Pt with hot dog, sweet potato, and coleslaw last night, pt had "egg mcmuffin" today for breakfast (sepearately, muffin, cheese, egg, canadian ham)    Currently in Pain?  Yes    Pain Score  -- stiffness            ADULT SLP TREATMENT - 02/03/17 1331      General Information   Behavior/Cognition  Alert;Cooperative;Pleasant mood      Treatment Provided   Treatment provided  Dysphagia      Dysphagia Treatment   Temperature Spikes Noted  No    Respiratory Status  Room air    Treatment Methods  Skilled observation;Therapeutic exercise;Compensation strategy training;Patient/caregiver education    Patient observed directly with PO's  Yes    Type of PO's observed  Thin liquids;Dysphagia 3  (soft)    Pharyngeal Phase Signs & Symptoms  Delayed throat clear 33% of swallows with liquids    Amount of cueing  Independent    Other treatment/comments  Pt performed HEP with usual min SLP cues faded to rare min A. Pt was independent with use of strategies with POs. Pt admitted she has not been as dedicated to performing HEP since last visit, and didn't do the exercises that were "hard". SLP encouraged pt to be more meticulous getting the exercises done, and the difficult exercises are evidence that she NEEDS to do those.      Assessment / Recommendations / Plan   Plan  Continue with current plan of care      Progression Toward Goals   Progression toward goals  Progressing toward goals       SLP Education - 02/03/17 1401    Education provided  Yes    Education Details  need to do exercises as prescribed    Person(s) Educated  Patient    Methods  Explanation    Comprehension  Verbalized understanding       SLP Short Term Goals - 02/03/17 1402      SLP SHORT TERM GOAL #1   Title  pt will  complete HEP with modified independence over 3 sessions    Baseline  01-21-17    Time  3    Period  Weeks    Status  On-going      SLP SHORT TERM GOAL #2   Title  pt will use any recommended safe swallow strategies following modified barium swallow (MBSS) during 3 ST sessions    Status  Achieved      SLP SHORT TERM GOAL #3   Title  pt will thicken liqiuds to prescribed consistency with modified independence, PRN following MBSS    Status  Deferred thin recommended       SLP Long Term Goals - 02/03/17 1402      SLP LONG TERM GOAL #1   Title  pt will perform swallow HEP correctly with modified independence over 4 sessions    Baseline  01-21-17    Time  7    Period  Weeks    Status  Revised      SLP LONG TERM GOAL #2   Title  pt will adhere to any safe swallow strategies with POs during 4 ST sessions with modified independence    Baseline  01-20-17, 01-21-17, 02-03-17    Time  7     Period  Weeks    Status  On-going      SLP LONG TERM GOAL #3   Title  pt will thicken liquids (PRN) to prescrbed consistency with modified independence over 3 sessions    Status  Deferred thin recommended       Plan - 02/03/17 1401    Clinical Impression Statement  Pt presents today with cont'd mild dysphagia as ID'd on modifeid (MBSS) 01-01-17. Pt is independent with swallow/aspiration precautions and rare min A with new exercises provided last session. Pt return demonstrated Shaker exercise. Once a week is appropriate given this. Skilled ST remains necessary for continuing to assist pt in improving her swallowing skills by HEP and to teach and ensure correct swallow precautions used with POs    Speech Therapy Frequency  1x /week    Duration  -- 8 weeks    Treatment/Interventions  Aspiration precaution training;Pharyngeal strengthening exercises;Diet toleration management by SLP;Trials of upgraded texture/liquids;Internal/external aids;Patient/family education;Compensatory strategies;SLP instruction and feedback;Oral motor exercises;Cueing hierarchy;Environmental controls;NMES any or all may be used    Potential to Achieve Goals  Good       Patient will benefit from skilled therapeutic intervention in order to improve the following deficits and impairments:   Dysphagia, oropharyngeal phase    Problem List Patient Active Problem List   Diagnosis Date Noted  . Status post stroke 01/22/2017  . Anemia 01/22/2017  . Cerebellar infarction ([facility]) 11/03/2016  . Acute ischemic stroke ([facility])   . Benign essential HTN   . Dysarthria, post-stroke   . Leukocytosis   . Acute blood loss anemia   . 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 Mixed hyperlipidemia 06/03/2007  . Allergic rhinitis 06/03/2007  . VAGINITIS, ATROPHIC 06/03/2007    [facility] ,MS, CCC-SLP  02/03/2017, 2:03 PM  [facility] [address] [place], [place], [address] Phone: [phone]   Fax:  [phone]   Name: [name] MRN: [id number] Date of Birth: Jun 22, 1940

## 2017-02-05 ENCOUNTER — Encounter: Payer: Self-pay | Admitting: Rehabilitative and Restorative Service Providers"

## 2017-02-05 ENCOUNTER — Ambulatory Visit: Payer: Medicare Other | Admitting: Rehabilitative and Restorative Service Providers"

## 2017-02-05 DIAGNOSIS — R2689 Other abnormalities of gait and mobility: Secondary | ICD-10-CM

## 2017-02-05 DIAGNOSIS — R29818 Other symptoms and signs involving the nervous system: Secondary | ICD-10-CM

## 2017-02-05 DIAGNOSIS — M6281 Muscle weakness (generalized): Secondary | ICD-10-CM

## 2017-02-05 DIAGNOSIS — R2681 Unsteadiness on feet: Secondary | ICD-10-CM

## 2017-02-05 NOTE — Therapy (Signed)
[facility] [address] [place], [place], [address] Phone: [phone]   Fax:  [phone]  Physical Therapy Treatment  Patient Details  Name: [name] MRN: [id number] Date of Birth: 04-26-1940 Referring Provider: Alysia Penna   Encounter Date: 02/05/2017  PT End of Session - 02/05/17 1603    Visit Number  7    Number of Visits  17 eval + 16 visits    Date for PT Re-Evaluation  02/28/17    PT Start Time  1233    PT Stop Time  1315    PT Time Calculation (min)  42 min    Equipment Utilized During Treatment  Gait belt    Activity Tolerance  Patient tolerated treatment well    Behavior During Therapy  [facility] for tasks assessed/performed       Past Medical History:  Diagnosis Date  . Allergic rhinitis 06/03/2007  . Arthritis   . Atrophic vaginitis   . 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 CVA (cerebral vascular accident) ([place])   . History of colon polyps   . HLD (hyperlipidemia)   . Hypertension   . Melanoma in situ [facility])     Past Surgical History:  Procedure Laterality Date  . BACK SURGERY    . CHOLECYSTECTOMY  2006  . IR GASTROSTOMY TUBE MOD SED  11/17/2016  . IR GASTROSTOMY TUBE REMOVAL  01/18/2017    There were no vitals filed for this visit.  Subjective Assessment - 02/05/17 1235    Subjective  The patient reports she is going out to lunch today after therapy.    Patient Stated Goals  "Walk on my own" without rolling walker outdoors.    Currently in Pain?  No/denies                      [facility] Adult PT Treatment/Exercise - 02/05/17 1237      Ambulation/Gait   Ambulation/Gait  Yes    Ambulation/Gait Assistance  5: Supervision;4: Min assist    Ambulation/Gait Assistance Details  Dynamic gait activities indoors including direction changes, forward/backwards walking alternating with sidestepping, marching, toe/heel walking with CGA and occasional min A due to loss  of balance.  Negotiated close obstacles in clinic performing walking around chairs, sitting and standing + 360 degree turn, and transitioning from sit<>walking.  Patient compensates during transitions with wider base of support.    Assistive device  None;Straight cane    Ambulation Surface  Level;Unlevel;Indoor;Outdoor    Gait Comments  Community gait on paved, level surfaces with SPC and supervision with wider base of support and no loss of balance.  PT provided CGA for curb negotiation with SPC.  Provided min A for walking along unlevel ground (pinestraw) with SPC      Neuro Re-ed    Neuro Re-ed Details   Backwards lunges emphasizing strength and balance with min A, forward lunges with min A, side-step lunges with CGA for safety.  Corner balance activities including foam with eyes open/eyes closed, head motion with eyes open on foam.  Rocker board standing with posterior wall bumps with min A and tactile cues for sensory feedback.               PT Short Term Goals - 01/28/17 5397      PT SHORT TERM GOAL #1   Title  The patient will be indep with HEP for balance and general mobility.  Baseline  met 01/28/17 (did them 2 sessions ago, verbalizes she is independent at home)    Time  4    Period  Weeks    Status  Achieved      PT SHORT TERM GOAL #2   Title  The patient will improve gait speed from 2.38 ft/sec to > or equal to 2.62 ft/sec to demo transition to "full community ambulator" classification of gait.    Baseline  2.71 ft/sec with rollator and 2.46 ft/sec with cane    Time  4    Period  Weeks    Status  Achieved      PT SHORT TERM GOAL #3   Title  The patient will improve TUG from 19.91 seconds to < or equal to 16 seconds to demo improving functional mobility.    Baseline  15.69 with cane, 13.50 with rollator    Time  4    Period  Weeks    Status  Achieved      PT SHORT TERM GOAL #4   Title  The patient will improve Berg from 39/56 to > or equal to 44/56 to demo  dec'ing risk for falls.    Baseline  46/56 on 01/28/17    Time  4    Period  Weeks    Status  Achieved      PT SHORT TERM GOAL #5   Title  The patient will ambulate x 250 ft in the clinic without loss of balance to demo improved safety for household ambulation.    Baseline  met with rollator     Time  4    Period  Weeks    Status  Achieved        PT Long Term Goals - 12/30/16 1332      PT LONG TERM GOAL #1   Title  The patient will be indep with progression of HEP.    Time  8    Period  Weeks    Target Date  02/28/17      PT LONG TERM GOAL #2   Title  The patient will improve Berg from 39/56 to > or equal than 46/56 to demo dec'd fall risk.    Time  8    Period  Weeks    Target Date  02/28/17      PT LONG TERM GOAL #3   Title  The patient will improve TUG from 19.91 seconds to < or equal to 14 seconds to demo dec'ing risk for falls.    Time  8    Period  Weeks    Target Date  02/28/17      PT LONG TERM GOAL #4   Title  The patinet will improve gait speed from 2.38 ft/sec to > or equal to 3.0 ft/sec to demo improving mobility.    Time  8    Period  Weeks    Target Date  02/28/17      PT LONG TERM GOAL #5   Title  The patient will improve functional status survey from 50% to > or equal to 62% to demo improving functional mobility.    Time  8    Period  Weeks    Target Date  02/28/17            Plan - 02/05/17 1609    Clinical Impression Statement  The patient is progressing from rollater to [facility] for short, paved community surfaces reporting she is carrying small trash bag to  dumpster at her home.  She continues to compensate with wider base of support and slowed pace during transitions and has occasional loss of balance without device in the clinic.  PT to continue progressing towards LTGs.     PT Treatment/Interventions  ADLs/Self Care Home Management;Balance training;Neuromuscular re-education;Patient/family education;Gait training;Stair training;Functional  mobility training;Therapeutic activities;Therapeutic exercise;Manual techniques    PT Next Visit Plan   continue gait train with SPC - taps to cones, side stepping, retro stepping, various surfaces/obstacles; Dynamic gait activities emphasizing a more narrow base of support, direction changes, head motion, and turns (in functional context for home negotiation and during transitions).  Gait without rollater RW; balance training.    Consulted and Agree with Plan of Care  Patient       Patient will benefit from skilled therapeutic intervention in order to improve the following deficits and impairments:  Abnormal gait, Decreased balance, Decreased strength, Difficulty walking  Visit Diagnosis: Other abnormalities of gait and mobility  Unsteadiness on feet  Muscle weakness (generalized)  Other symptoms and signs involving the nervous system     Problem List Patient Active Problem List   Diagnosis Date Noted  . Status post stroke 01/22/2017  . Anemia 01/22/2017  . 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 Acute ischemic stroke ([place])   . Benign essential HTN   . Dysarthria, post-stroke   . Leukocytosis   . Acute blood loss anemia   . 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 Mixed hyperlipidemia 06/03/2007  . Allergic rhinitis 06/03/2007  . VAGINITIS, ATROPHIC 06/03/2007    Hurman Ketelsen, PT 02/05/2017, 4:11 PM  [facility] [address] [place], [place], [address] Phone: [phone]   Fax:  [phone]  Name: [name] MRN: [id number] Date of Birth: 1940/05/03

## 2017-02-08 ENCOUNTER — Ambulatory Visit: Payer: Medicare Other | Admitting: Physical Therapy

## 2017-02-08 ENCOUNTER — Ambulatory Visit: Payer: Medicare Other

## 2017-02-08 DIAGNOSIS — R2689 Other abnormalities of gait and mobility: Secondary | ICD-10-CM | POA: Diagnosis not present

## 2017-02-08 DIAGNOSIS — R1312 Dysphagia, oropharyngeal phase: Secondary | ICD-10-CM

## 2017-02-08 NOTE — Patient Instructions (Signed)
Take in little enough sip to swallow ENTIRE thing with one swallow.  Have liquids OR  food in your mouth at one time, not both!  Picture ME sitting with you when you eat.

## 2017-02-08 NOTE — Therapy (Signed)
[facility] [address] [place], [place], [address] Phone: [phone]   Fax:  [phone]  Speech Language Pathology Treatment  Patient Details  Name: [name] MRN: [id number] Date of Birth: 07/09/1940 Referring Provider: Claudette LawsKirsteins, Andrew, MD   Encounter Date: 02/08/2017  End of Session - 02/08/17 1602    Visit Number  5    Number of Visits  17    Date for SLP Re-Evaluation  03/05/17    SLP Start Time  1104    SLP Stop Time   1145    SLP Time Calculation (min)  41 min    Activity Tolerance  Patient tolerated treatment well       Past Medical History:  Diagnosis Date  . Allergic rhinitis 06/03/2007  . Arthritis   . Atrophic vaginitis   . 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 CVA (cerebral vascular accident) (HCC)   . History of colon polyps   . HLD (hyperlipidemia)   . Hypertension   . Melanoma in situ [facility](HCC)     Past Surgical History:  Procedure Laterality Date  . BACK SURGERY    . CHOLECYSTECTOMY  2006  . IR GASTROSTOMY TUBE MOD SED  11/17/2016  . IR GASTROSTOMY TUBE REMOVAL  01/18/2017    There were no vitals filed for this visit.  Subjective Assessment - 02/08/17 1113    Subjective  [facility] and I had a hamburger.    Currently in Pain?  No/denies            ADULT SLP TREATMENT - 02/08/17 1115      General Information   Behavior/Cognition  Alert;Cooperative;Pleasant mood      Treatment Provided   Treatment provided  Dysphagia      Dysphagia Treatment   Temperature Spikes Noted  No    Respiratory Status  Room air    Treatment Methods  Skilled observation    Patient observed directly with PO's  Yes    Type of PO's observed  Dysphagia 3 (soft);Thin liquids    Pharyngeal Phase Signs & Symptoms  Delayed throat clear 2/8 boluses    Amount of cueing  Independent    Other treatment/comments  Pt with dys III and thin liquids during session today  following precautions without cues necessary. SLP ascertained that pt does still cough "when I'm not paying atteniton" - approx 1-2/day. SLP told pt to picture him sitting with pt. It happens most of the time when she has had a bite of food, and then adds a sip of liquids. SLP told pt to have food OR liquids in mouth at once. SLP also told pt to only take in enough liquids to swallow the whole bolus on ONE swallow. Pt stated she may have been piecemealing.  Pt req'd usual mod cues for [facility] but faded cues to rare min cues. Pt will need to cont to gain more success with Mendelsohn.      Assessment / Recommendations / Plan   Plan  Continue with current plan of care;Goals updated      Progression Toward Goals   Progression toward goals  Progressing toward goals       SLP Education - 02/08/17 1601    Education provided  Yes    Education Details  no piecemealing liquids, solid or liquid bolus at once - do not mix    Person(s) Educated  Patient    Methods  Explanation  Comprehension  Verbalized understanding       SLP Short Term Goals - 02/08/17 1606      SLP SHORT TERM GOAL #1   Title  pt will complete HEP with modified independence over 3 sessions    Baseline  01-21-17    Time  2    Period  Weeks    Status  On-going      SLP SHORT TERM GOAL #2   Title  pt will use any recommended safe swallow strategies following modified barium swallow (MBSS) during 3 ST sessions    Status  Achieved      SLP SHORT TERM GOAL #3   Title  pt will thicken liqiuds to prescribed consistency with modified independence, PRN following MBSS    Status  Deferred thin recommended       SLP Long Term Goals - 02/08/17 1606      SLP LONG TERM GOAL #1   Title  pt will perform swallow HEP correctly with modified independence over 4 sessions    Baseline  01-21-17    Time  6    Period  Weeks    Status  Revised      SLP LONG TERM GOAL #2   Title  pt will adhere to any safe swallow strategies with POs  during 4 ST sessions with modified independence    Baseline  --    Time  --    Period  --    Status  Achieved      SLP LONG TERM GOAL #3   Title  pt will thicken liquids (PRN) to prescrbed consistency with modified independence over 3 sessions    Status  Deferred thin recommended      SLP LONG TERM GOAL #4   Title  pt will demo correct liquid presentation (not piecemealing liquids) with solids over three therapy sessions    Baseline  02-08-17    Time  6    Period  Weeks    Status  New       Plan - 02/08/17 1602    Clinical Impression Statement  Pt is independent with swallow/aspiration precautions although SLP learned some things today and provided pt reminders/education (see PT Education). Pt was independent with HEP once she learned Mendelsohn. Skilled ST remains necessary for continuing to assist pt in improving her swallowing skills by continuing to assess proper completion of HEP and to ensure correct swallow precautions used with POs    Speech Therapy Frequency  1x /week    Duration  -- 8 weeks    Treatment/Interventions  Aspiration precaution training;Pharyngeal strengthening exercises;Diet toleration management by SLP;Trials of upgraded texture/liquids;Internal/external aids;Patient/family education;Compensatory strategies;SLP instruction and feedback;Oral motor exercises;Cueing hierarchy;Environmental controls;NMES any or all may be used    Potential to Achieve Goals  Good       Patient will benefit from skilled therapeutic intervention in order to improve the following deficits and impairments:   Dysphagia, oropharyngeal phase    Problem List Patient Active Problem List   Diagnosis Date Noted  . Status post stroke 01/22/2017  . Anemia 01/22/2017  . Cerebellar infarction (HCC) 11/03/2016  . Acute ischemic stroke (HCC)   . Benign essential HTN   . Dysarthria, post-stroke   . Leukocytosis   . Acute blood loss anemia   . Cerebral thrombosis with cerebral infarction  10/29/2016  . 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 Mixed hyperlipidemia 06/03/2007  . Allergic rhinitis 06/03/2007  . VAGINITIS, ATROPHIC 06/03/2007    [facility] ,MS,  CCC-SLP  02/08/2017, 4:09 PM  [place] [address] [place], [place], [address] Phone: [phone]   Fax:  [phone]   Name: [name] MRN: [id number] Date of Birth: 1940/01/17

## 2017-02-10 ENCOUNTER — Ambulatory Visit: Payer: Medicare Other | Admitting: Rehabilitative and Restorative Service Providers"

## 2017-02-10 ENCOUNTER — Encounter: Payer: Self-pay | Admitting: Rehabilitative and Restorative Service Providers"

## 2017-02-10 DIAGNOSIS — R2689 Other abnormalities of gait and mobility: Secondary | ICD-10-CM

## 2017-02-10 DIAGNOSIS — R2681 Unsteadiness on feet: Secondary | ICD-10-CM

## 2017-02-10 DIAGNOSIS — R29818 Other symptoms and signs involving the nervous system: Secondary | ICD-10-CM

## 2017-02-10 DIAGNOSIS — M6281 Muscle weakness (generalized): Secondary | ICD-10-CM

## 2017-02-10 NOTE — Therapy (Signed)
[place] [address] [place], [place], [address] Phone: [phone]   Fax:  [phone]  Physical Therapy Treatment  Patient Details  Name: EITHEL RYALL MRN: [id number] Date of Birth: [birth year] Referring Provider: Alysia Penna   Encounter Date: 02/10/2017  PT End of Session - 02/10/17 1522    Visit Number  8    Number of Visits  17 eval + 16 visits    Date for PT Re-Evaluation  02/28/17    PT Start Time  1020    PT Stop Time  1100    PT Time Calculation (min)  40 min    Equipment Utilized During Treatment  Gait belt    Activity Tolerance  Patient tolerated treatment well    Behavior During Therapy  [facility] for tasks assessed/performed       Past Medical History:  Diagnosis Date  . Allergic rhinitis 06/03/2007  . Arthritis   . Atrophic vaginitis   . 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 CVA (cerebral vascular accident) ([place])   . History of colon polyps   . HLD (hyperlipidemia)   . Hypertension   . Melanoma in situ [facility])     Past Surgical History:  Procedure Laterality Date  . BACK SURGERY    . CHOLECYSTECTOMY  2006  . IR GASTROSTOMY TUBE MOD SED  11/17/2016  . IR GASTROSTOMY TUBE REMOVAL  01/18/2017    There were no vitals filed for this visit.  Subjective Assessment - 02/10/17 1023    Subjective  The patient reports that she was able to walk with the cane in the community for lunch last week.  Overall, her balance is about the same per report this week.     Patient Stated Goals  "Walk on my own" without rolling walker outdoors.    Currently in Pain?  No/denies         [facility] PT Assessment - 02/10/17 1030      Timed Up and Go Test   TUG  -- 11.34 seconds                  [facility] Adult PT Treatment/Exercise - 02/10/17 1030      Ambulation/Gait   Ambulation/Gait  Yes    Ambulation/Gait Assistance  5: Supervision;4: Min assist    Ambulation/Gait Assistance Details   Dynamic gait activities performing forward/backwards walking, direction changes, heel/toe walking, marching.    Gait Comments  Functional gait activities working on speed, bending to pick up items, turning in place and moving sit<>stand<>walking for quicker transitions.       Neuro Re-ed    Neuro Re-ed Details   Tandem walking with min A, Compliant surface standing with cone tapping, step ups/step downs to 2" foam surface.  Patient performed marching on compliant surfaces, turning in place.  Braiding with CGA for safety, Compliant surfaces alternating forward/backward lunges.       Exercises   Exercises  Other Exercises    Other Exercises   Step ups laterally x 10 reps to right and left sides emphasizing balance/control dec'ing UE support, mini squats on compliant foam adding side-step squat.               PT Short Term Goals - 01/28/17 0354      PT SHORT TERM GOAL #1   Title  The patient will be indep with HEP for balance and general mobility.    Baseline  met 01/28/17 (did  them 2 sessions ago, verbalizes she is independent at home)    Time  4    Period  Weeks    Status  Achieved      PT SHORT TERM GOAL #2   Title  The patient will improve gait speed from 2.38 ft/sec to > or equal to 2.62 ft/sec to demo transition to "full community ambulator" classification of gait.    Baseline  2.71 ft/sec with rollator and 2.46 ft/sec with cane    Time  4    Period  Weeks    Status  Achieved      PT SHORT TERM GOAL #3   Title  The patient will improve TUG from 19.91 seconds to < or equal to 16 seconds to demo improving functional mobility.    Baseline  15.69 with cane, 13.50 with rollator    Time  4    Period  Weeks    Status  Achieved      PT SHORT TERM GOAL #4   Title  The patient will improve Berg from 39/56 to > or equal to 44/56 to demo dec'ing risk for falls.    Baseline  46/56 on 01/28/17    Time  4    Period  Weeks    Status  Achieved      PT SHORT TERM GOAL #5   Title   The patient will ambulate x 250 ft in the clinic without loss of balance to demo improved safety for household ambulation.    Baseline  met with rollator     Time  4    Period  Weeks    Status  Achieved        PT Long Term Goals - 12/30/16 1332      PT LONG TERM GOAL #1   Title  The patient will be indep with progression of HEP.    Time  8    Period  Weeks    Target Date  02/28/17      PT LONG TERM GOAL #2   Title  The patient will improve Berg from 39/56 to > or equal than 46/56 to demo dec'd fall risk.    Time  8    Period  Weeks    Target Date  02/28/17      PT LONG TERM GOAL #3   Title  The patient will improve TUG from 19.91 seconds to < or equal to 14 seconds to demo dec'ing risk for falls.    Time  8    Period  Weeks    Target Date  02/28/17      PT LONG TERM GOAL #4   Title  The patinet will improve gait speed from 2.38 ft/sec to > or equal to 3.0 ft/sec to demo improving mobility.    Time  8    Period  Weeks    Target Date  02/28/17      PT LONG TERM GOAL #5   Title  The patient will improve functional status survey from 50% to > or equal to 62% to demo improving functional mobility.    Time  8    Period  Weeks    Target Date  02/28/17            Plan - 02/10/17 1522    Clinical Impression Statement  The patient is continuing to show progress with high level balance and dynamic gait activites.  Continue working towards [employer].  *Patient is dec'ing schedule to  1x/week due to copay.  PT recommending continuing therapy to progress to no device and return to community exercise.     PT Treatment/Interventions  ADLs/Self Care Home Management;Balance training;Neuromuscular re-education;Patient/family education;Gait training;Stair training;Functional mobility training;Therapeutic activities;Therapeutic exercise;Manual techniques    PT Next Visit Plan  Gait with SPC, gait without device, compliant surface training, dyanmic gait emphasizing narrow base of support.   Turning in place.  ASK ABOUT WORKOUT VIDEO.    Consulted and Agree with Plan of Care  Patient       Patient will benefit from skilled therapeutic intervention in order to improve the following deficits and impairments:  Abnormal gait, Decreased balance, Decreased strength, Difficulty walking  Visit Diagnosis: Other abnormalities of gait and mobility  Unsteadiness on feet  Muscle weakness (generalized)  Other symptoms and signs involving the nervous system     Problem List Patient Active Problem List   Diagnosis Date Noted  . Status post stroke 01/22/2017  . Anemia 01/22/2017  . Cerebellar infarction ([place]) 11/03/2016  . Acute ischemic stroke ([place])   . Benign essential HTN   . Dysarthria, post-stroke   . Leukocytosis   . Acute blood loss anemia   . 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 Mixed hyperlipidemia 06/03/2007  . Allergic rhinitis 06/03/2007  . VAGINITIS, ATROPHIC 06/03/2007    Syble Picco, PT 02/10/2017, 3:25 PM  [place] [address] [place], [place], [address] Phone: [phone]   Fax:  [phone]  Name: DNASIA GAUNA MRN: [id number] Date of Birth: Dec 15, 1940

## 2017-02-16 ENCOUNTER — Ambulatory Visit: Payer: Medicare Other | Attending: Physical Medicine & Rehabilitation

## 2017-02-16 ENCOUNTER — Telehealth: Payer: Self-pay

## 2017-02-16 DIAGNOSIS — R2689 Other abnormalities of gait and mobility: Secondary | ICD-10-CM | POA: Insufficient documentation

## 2017-02-16 DIAGNOSIS — R29818 Other symptoms and signs involving the nervous system: Secondary | ICD-10-CM | POA: Diagnosis present

## 2017-02-16 DIAGNOSIS — R1312 Dysphagia, oropharyngeal phase: Secondary | ICD-10-CM | POA: Insufficient documentation

## 2017-02-16 DIAGNOSIS — R899 Unspecified abnormal finding in specimens from other organs, systems and tissues: Secondary | ICD-10-CM

## 2017-02-16 DIAGNOSIS — R2681 Unsteadiness on feet: Secondary | ICD-10-CM | POA: Diagnosis present

## 2017-02-16 DIAGNOSIS — M6281 Muscle weakness (generalized): Secondary | ICD-10-CM | POA: Insufficient documentation

## 2017-02-16 NOTE — Telephone Encounter (Signed)
Pt coming for labs 02/17/17. Please place future orders. Thank you.  

## 2017-02-16 NOTE — Telephone Encounter (Signed)
Orders put in

## 2017-02-16 NOTE — Therapy (Signed)
[place] [address] [place], [place], [address] Phone: [phone]   Fax:  [phone]  Speech Language Pathology Treatment  Patient Details  Name: [name] MRN: [id number] Date of Birth: 1940-08-29 Referring Provider: Alysia Penna, MD   Encounter Date: 02/16/2017  End of Session - 02/16/17 1251    Visit Number  6    Number of Visits  17    Date for SLP Re-Evaluation  03/05/17    SLP Start Time  1151    SLP Stop Time   1231    SLP Time Calculation (min)  40 min    Activity Tolerance  Patient tolerated treatment well       Past Medical History:  Diagnosis Date  . Allergic rhinitis 06/03/2007  . Arthritis   . Atrophic vaginitis   . 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 CVA (cerebral vascular accident) ([place])   . History of colon polyps   . HLD (hyperlipidemia)   . Hypertension   . Melanoma in situ [facility])     Past Surgical History:  Procedure Laterality Date  . BACK SURGERY    . CHOLECYSTECTOMY  2006  . IR GASTROSTOMY TUBE MOD SED  11/17/2016  . IR GASTROSTOMY TUBE REMOVAL  01/18/2017    There were no vitals filed for this visit.  Subjective Assessment - 02/16/17 1204    Subjective  At spaghetti, salad, garlic bread, chocolate cake at party Sunday. No difficulty - smaller bites.    Currently in Pain?  No/denies            ADULT SLP TREATMENT - 02/16/17 1205      General Information   Behavior/Cognition  Alert;Cooperative;Pleasant mood      Treatment Provided   Treatment provided  Dysphagia      Dysphagia Treatment   Temperature Spikes Noted  No    Respiratory Status  Room air    Treatment Methods  Skilled observation    Patient observed directly with PO's  Yes    Type of PO's observed  Thin liquids    Pharyngeal Phase Signs & Symptoms  -- none noted    Other treatment/comments  "Everything (at the party) went fine I just couldn't talk with the other people I had  to keep my mouth shut." (due to needing to focus on compensations).  Had muffin today - needed coffee to assist in clearance, apple and peanut butter went better than muffin. Pt req'd min-mod A with Mendelsohn today - after pt had correct procedure last session. After 5 minutes and SLP demo pt was again correctly completing exercise. Other swallow exercises went well with drop or two of H2O. 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       Pain Assessment   Pain Assessment  No/denies pain      Assessment / Recommendations / Plan   Plan  Continue with current plan of care      Progression Toward Goals   Progression toward goals  Progressing toward goals       SLP Education - 02/16/17 1250    Education provided  Yes    Education Details  MEndelsohn procedure    Person(s) Educated  Patient    Methods  Explanation;Demonstration;Verbal cues    Comprehension  Returned demonstration;Verbalized understanding;Verbal cues required       SLP Short Term Goals - 02/16/17 1252  SLP SHORT TERM GOAL #1   Title  pt will complete HEP with modified independence over 3 sessions    Baseline  01-21-17    Time  1    Period  Weeks    Status  Partially Met      SLP SHORT TERM GOAL #2   Title  pt will use any recommended safe swallow strategies following modified barium swallow (MBSS) during 3 ST sessions    Status  Achieved      SLP SHORT TERM GOAL #3   Title  pt will thicken liqiuds to prescribed consistency with modified independence, PRN following MBSS    Status  Deferred thin recommended       SLP Long Term Goals - 02/16/17 1209      SLP LONG TERM GOAL #1   Title  pt will perform swallow HEP correctly with modified independence over 4 sessions    Baseline  01-21-17    Time  5    Period  Weeks    Status  Revised      SLP LONG TERM GOAL #2   Title  pt will adhere to any safe swallow strategies with POs during 4 ST sessions with modified independence     Status  Achieved      SLP LONG TERM GOAL #3   Title  pt will thicken liquids (PRN) to prescrbed consistency with modified independence over 3 sessions    Status  Deferred thin recommended      SLP LONG TERM GOAL #4   Title  pt will demo correct liquid presentation (not piecemealing liquids) with solids over three therapy sessions    Baseline  02-08-17, 02-16-17    Time  5    Period  Weeks    Status  On-going       Plan - 02/16/17 1251    Clinical Impression Statement   Pt is independent again with HEP once she got re-eduation re: C[place]. Skilled ST remains necessary for continuing to assist pt in improving her swallowing skills by continuing to assess proper completion of HEP and to ensure correct swallow precautions used with POs    Speech Therapy Frequency  1x /week    Duration  -- 8 weeks    Treatment/Interventions  Aspiration precaution training;Pharyngeal strengthening exercises;Diet toleration management by SLP;Trials of upgraded texture/liquids;Internal/external aids;Patient/family education;Compensatory strategies;SLP instruction and feedback;Oral motor exercises;Cueing hierarchy;Environmental controls;NMES any or all may be used    Potential to Achieve Goals  Good       Patient will benefit from skilled therapeutic intervention in order to improve the following deficits and impairments:   Dysphagia, oropharyngeal phase    Problem List Patient Active Problem List   Diagnosis Date Noted  . Status post stroke 01/22/2017  . Anemia 01/22/2017  . Cerebellar infarction ([place]) 11/03/2016  . Acute ischemic stroke ([place])   . Benign essential HTN   . Dysarthria, post-stroke   . Leukocytosis   . Acute blood loss anemia   . 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 Mixed hyperlipidemia 06/03/2007  . Allergic rhinitis 06/03/2007  . VAGINITIS, ATROPHIC 06/03/2007    [facility] ,MS, CCC-SLP  02/16/2017, 12:54  PM  [place] [address] [place], [place], [address] Phone: [phone]   Fax:  [phone]   Name: [name] MRN: [id number] Date of Birth: [birth year]

## 2017-02-17 ENCOUNTER — Other Ambulatory Visit (INDEPENDENT_AMBULATORY_CARE_PROVIDER_SITE_OTHER): Payer: Medicare Other

## 2017-02-17 DIAGNOSIS — R899 Unspecified abnormal finding in specimens from other organs, systems and tissues: Secondary | ICD-10-CM

## 2017-02-17 LAB — TSH: TSH: 5.59 u[IU]/mL — ABNORMAL HIGH (ref 0.35–4.50)

## 2017-02-17 LAB — T4, FREE: Free T4: 0.61 ng/dL (ref 0.60–1.60)

## 2017-02-23 ENCOUNTER — Encounter: Payer: Medicare Other | Admitting: Speech Pathology

## 2017-02-24 ENCOUNTER — Ambulatory Visit: Payer: Medicare Other | Admitting: Rehabilitative and Restorative Service Providers"

## 2017-02-24 ENCOUNTER — Ambulatory Visit: Payer: Medicare Other

## 2017-02-24 DIAGNOSIS — R1312 Dysphagia, oropharyngeal phase: Secondary | ICD-10-CM | POA: Diagnosis not present

## 2017-02-24 NOTE — Therapy (Signed)
[facility] [address] [place], [place], [address] Phone: [phone]   Fax:  [phone]  Speech Language Pathology Treatment  Patient Details  Name: [name] MRN: [id number] Date of Birth: 1940/07/14 Referring Provider: Alysia Penna, MD   Encounter Date: 02/24/2017  End of Session - 02/24/17 1050    Visit Number  7    Number of Visits  17    Date for SLP Re-Evaluation  03/05/17    SLP Start Time  1017    SLP Stop Time   1048    SLP Time Calculation (min)  31 min    Activity Tolerance  Patient tolerated treatment well       Past Medical History:  Diagnosis Date  . Allergic rhinitis 06/03/2007  . Arthritis   . Atrophic vaginitis   . 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 CVA (cerebral vascular accident) ([place])   . History of colon polyps   . HLD (hyperlipidemia)   . Hypertension   . Melanoma in situ [facility])     Past Surgical History:  Procedure Laterality Date  . BACK SURGERY    . CHOLECYSTECTOMY  2006  . IR GASTROSTOMY TUBE MOD SED  11/17/2016  . IR GASTROSTOMY TUBE REMOVAL  01/18/2017    There were no vitals filed for this visit.  Subjective Assessment - 02/24/17 1026    Subjective  Pt went to [employer] and got stromboli - ate slowly but all was ok.     Currently in Pain?  No/denies            ADULT SLP TREATMENT - 02/24/17 1032      General Information   Behavior/Cognition  Alert;Cooperative;Pleasant mood      Treatment Provided   Treatment provided  Dysphagia      Dysphagia Treatment   Temperature Spikes Noted  No    Respiratory Status  Room air    Treatment Methods  Skilled observation;Therapeutic exercise;Compensation strategy training    Patient observed directly with PO's  Yes    Type of PO's observed  Dysphagia 3 (soft);Thin liquids    Oral Phase Signs & Symptoms  -- none noted    Pharyngeal Phase Signs & Symptoms  -- none noted    Other  treatment/comments  Reports bread and drier foods are more difficult to clear pharyngeally, eats very little of these things anymore. Pt completed HEP without A - pt was independent. No overt s/s aspiration with POs and pt followed precautions wihtout cues necessary from SLP - pt was independent.       Assessment / Recommendations / Plan   Plan  Continue with current plan of care      Dysphagia Recommendations   Diet recommendations  Dysphagia 3 (mechanical soft);Thin liquid    Liquids provided via  Cup    Medication Administration  Whole meds with liquid    Compensations  Slow rate;Small sips/bites;Multiple dry swallows after each bite/sip      Progression Toward Goals   Progression toward goals  Progressing toward goals         SLP Short Term Goals - 02/24/17 1054      SLP SHORT TERM GOAL #1   Title  pt will complete HEP with modified independence over 3 sessions    Baseline  01-21-17    Status  Partially Met      SLP SHORT TERM GOAL #2   Title  pt will use  any recommended safe swallow strategies following modified barium swallow (MBSS) during 3 ST sessions    Status  Achieved      SLP SHORT TERM GOAL #3   Title  pt will thicken liqiuds to prescribed consistency with modified independence, PRN following MBSS    Status  Deferred thin recommended       SLP Long Term Goals - 02/24/17 1053      SLP LONG TERM GOAL #1   Title  pt will perform swallow HEP correctly with modified independence over 4 sessions    Baseline  01-21-17, 02-24-17    Time  4    Period  Weeks    Status  On-going      SLP LONG TERM GOAL #2   Title  pt will adhere to any safe swallow strategies with POs during 4 ST sessions with modified independence    Status  Achieved      SLP LONG TERM GOAL #3   Title  pt will thicken liquids (PRN) to prescrbed consistency with modified independence over 3 sessions    Status  Deferred thin recommended      SLP LONG TERM GOAL #4   Title  pt will demo correct liquid  presentation (not piecemealing liquids) with solids over three therapy sessions    Status  Achieved       Plan - 02/24/17 1051    Clinical Impression Statement   Pt is independent with HEP, including Mendelsohn. Is independent with precautions with POs and had no overt s/s aspiration with POs. Skilled ST remains necessary for 1-2 more sessions ensuring consistency in swallowing safety, completion of HEP, and in following correct swallow precautions used with POs Likely d/c in 1-2 more visits.    Speech Therapy Frequency  1x /week    Duration  -- 8 weeks    Treatment/Interventions  Aspiration precaution training;Pharyngeal strengthening exercises;Diet toleration management by SLP;Trials of upgraded texture/liquids;Internal/external aids;Patient/family education;Compensatory strategies;SLP instruction and feedback;Oral motor exercises;Cueing hierarchy;Environmental controls;NMES any or all may be used    Potential to Achieve Goals  Good       Patient will benefit from skilled therapeutic intervention in order to improve the following deficits and impairments:   Dysphagia, oropharyngeal phase    Problem List Patient Active Problem List   Diagnosis Date Noted  . Status post stroke 01/22/2017  . Anemia 01/22/2017  . 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 Acute ischemic stroke ([place])   . Benign essential HTN   . Dysarthria, post-stroke   . Leukocytosis   . Acute blood loss anemia   . 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 Mixed hyperlipidemia 06/03/2007  . Allergic rhinitis 06/03/2007  . VAGINITIS, ATROPHIC 06/03/2007    [facility] ,MS, CCC-SLP  02/24/2017, 10:54 AM  [facility] [address] [place], [place], [address] Phone: [phone]   Fax:  [phone]   Name: KIMIKO COMMON MRN: [id number] Date of Birth: [birth year]

## 2017-02-24 NOTE — Patient Instructions (Signed)
Continue with all exercises. 

## 2017-02-25 ENCOUNTER — Encounter: Payer: Medicare Other | Admitting: Speech Pathology

## 2017-03-03 ENCOUNTER — Encounter: Payer: Self-pay | Admitting: Rehabilitative and Restorative Service Providers"

## 2017-03-03 ENCOUNTER — Ambulatory Visit: Payer: Medicare Other

## 2017-03-03 ENCOUNTER — Ambulatory Visit: Payer: Medicare Other | Admitting: Rehabilitative and Restorative Service Providers"

## 2017-03-03 DIAGNOSIS — M6281 Muscle weakness (generalized): Secondary | ICD-10-CM

## 2017-03-03 DIAGNOSIS — R29818 Other symptoms and signs involving the nervous system: Secondary | ICD-10-CM

## 2017-03-03 DIAGNOSIS — R2689 Other abnormalities of gait and mobility: Secondary | ICD-10-CM

## 2017-03-03 DIAGNOSIS — R1312 Dysphagia, oropharyngeal phase: Secondary | ICD-10-CM | POA: Diagnosis not present

## 2017-03-03 DIAGNOSIS — R2681 Unsteadiness on feet: Secondary | ICD-10-CM

## 2017-03-03 NOTE — Therapy (Signed)
[facility] [address] [place], [place], [address] Phone: [phone]   Fax:  [phone]  Physical Therapy Treatment  Patient Details  Name: [name] MRN: [id number] Date of Birth: May 29, 1940 Referring Provider: Alysia Penna   Encounter Date: 03/03/2017  PT End of Session - 03/03/17 1026    Visit Number  9    Number of Visits  17 eval + 16 visits    Date for PT Re-Evaluation  02/28/17    PT Start Time  1020    PT Stop Time  1100    PT Time Calculation (min)  40 min    Equipment Utilized During Treatment  Gait belt    Activity Tolerance  Patient tolerated treatment well    Behavior During Therapy  [facility] for tasks assessed/performed       Past Medical History:  Diagnosis Date  . Allergic rhinitis 06/03/2007  . Arthritis   . Atrophic vaginitis   . 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 CVA (cerebral vascular accident) ([place])   . History of colon polyps   . HLD (hyperlipidemia)   . Hypertension   . Melanoma in situ [facility])     Past Surgical History:  Procedure Laterality Date  . BACK SURGERY    . CHOLECYSTECTOMY  2006  . IR GASTROSTOMY TUBE MOD SED  11/17/2016  . IR GASTROSTOMY TUBE REMOVAL  01/18/2017    There were no vitals filed for this visit.  Subjective Assessment - 03/03/17 1024    Subjective  The patient reports that she has stopped using her RW.  She is using SPC in community and no device in the home.  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  Biggest challenge is still balance control.  "My right leg still is not up to what it was."  (notes from strength standpoint).  She reports that she did part of home workout tape.    She plans to wait until end of March to return to [employer].    Patient Stated Goals  "Walk on my own" without rolling walker outdoors.    Currently in Pain?  No/denies         [facility] PT Assessment - 03/03/17 1029      Ambulation/Gait   Ambulation/Gait  Yes    Ambulation/Gait Assistance  6: Modified independent (Device/Increase time);4: Min guard    Ambulation/Gait Assistance Details  Patient mod indep with SPC on level surfaces.  Dynamic gait activiites requiring CGA including heel walking, toe walking, direction changes, marching.     Assistive device  None;Straight cane    Ambulation Surface  Level;Indoor    Gait velocity  2.57 ft/sec without device    Stairs  Yes    Stairs Assistance  6: Modified independent (Device/Increase time)    Stair Management Technique  Alternating pattern;One rail Right    Number of Stairs  8    Gait Comments  --      Berg Balance Test   Sit to Stand  Able to stand without using hands and stabilize independently    Standing Unsupported  Able to stand safely 2 minutes    Sitting with Back Unsupported but Feet Supported on Floor or Stool  Able to sit safely and securely 2 minutes    Stand to Sit  Sits safely with minimal use of hands    Transfers  Able to transfer safely, minor use of hands  Standing Unsupported with Eyes Closed  Able to stand 10 seconds safely    Standing Ubsupported with Feet Together  Able to place feet together independently and stand 1 minute safely    From Standing, Reach Forward with Outstretched Arm  Can reach forward >12 cm safely (5")    From Standing Position, Pick up Object from [place] to pick up shoe safely and easily    From Standing Position, Turn to Look Behind Over each Shoulder  Looks behind from both sides and weight shifts well    Turn 360 Degrees  Able to turn 360 degrees safely but slowly    Standing Unsupported, Alternately Place Feet on Step/Stool  Able to stand independently and safely and complete 8 steps in 20 seconds    Standing Unsupported, One Foot in Front  Able to take small step independently and hold 30 seconds    Standing on One Leg  Tries to lift leg/unable to hold 3 seconds but remains standing independently     Total Score  48    Berg comment:  48/56      Timed Up and Go Test   TUG  -- 12.38 seconds without device         Vestibular Assessment - 03/03/17 1046      Positional Testing   Sidelying Test  Sidelying Right;Sidelying Left      Sidelying Right   Sidelying Right Duration  30 seconds- sensation of ceiling movement.      Sidelying Right Symptoms  No nystagmus viewecd in room light      Sidelying Left   Sidelying Left Duration  >1 minute- notes ceiling is moving in upward direction.  Denies nausea.  When moving back to sitting, notes a general unsteady sensation    Sidelying Left Symptoms  No nystagmus viewed in room light              [facility] Adult PT Treatment/Exercise - 03/03/17 1029      Neuro Re-ed    Neuro Re-ed Details   Habituation activities performing sit<>bilateral sidelying.to work through dizziness. Instructed in activities for HEP.  Reviewed prior HEP for corner balance and recommended the patient return to doing regularly.      Exercises   Exercises  Other Exercises    Other Exercises   Thomas test stretch.             PT Education - 03/03/17 1101    Education provided  Yes    Education Details  added habituation and recommended return to other HEP    Person(s) Educated  Patient    Methods  Explanation;Demonstration    Comprehension  Returned demonstration;Verbalized understanding       PT Short Term Goals - 01/28/17 0939      PT SHORT TERM GOAL #1   Title  The patient will be indep with HEP for balance and general mobility.    Baseline  met 01/28/17 (did them 2 sessions ago, verbalizes she is independent at home)    Time  4    Period  Weeks    Status  Achieved      PT SHORT TERM GOAL #2   Title  The patient will improve gait speed from 2.38 ft/sec to > or equal to 2.62 ft/sec to demo transition to "full community ambulator" classification of gait.    Baseline  2.71 ft/sec with rollator and 2.46 ft/sec with cane    Time  4    Period  Weeks    Status  Achieved      PT SHORT TERM GOAL #3   Title  The patient will improve TUG from 19.91 seconds to < or equal to 16 seconds to demo improving functional mobility.    Baseline  15.69 with cane, 13.50 with rollator    Time  4    Period  Weeks    Status  Achieved      PT SHORT TERM GOAL #4   Title  The patient will improve Berg from 39/56 to > or equal to 44/56 to demo dec'ing risk for falls.    Baseline  46/56 on 01/28/17    Time  4    Period  Weeks    Status  Achieved      PT SHORT TERM GOAL #5   Title  The patient will ambulate x 250 ft in the clinic without loss of balance to demo improved safety for household ambulation.    Baseline  met with rollator     Time  4    Period  Weeks    Status  Achieved        PT Long Term Goals - 03/03/17 1028      PT LONG TERM GOAL #1   Title  The patient will be indep with progression of HEP.    Time  8    Period  Weeks    Status  On-going      PT LONG TERM GOAL #2   Title  The patient will improve Berg from 39/56 to > or equal than 46/56 to demo dec'd fall risk.    Baseline  48/56 on 03/03/17    Time  8    Period  Weeks    Status  Achieved      PT LONG TERM GOAL #3   Title  The patient will improve TUG from 19.91 seconds to < or equal to 14 seconds to demo dec'ing risk for falls.    Baseline  03/03/17= 12.38 seconds for TUG without device.    Time  8    Period  Weeks    Status  Achieved      PT LONG TERM GOAL #4   Title  The patinet will improve gait speed from 2.38 ft/sec to > or equal to 3.0 ft/sec to demo improving mobility.    Baseline  2.57 ft/sec without a device on 03/03/17    Time  8    Period  Weeks    Status  Partially Met      PT LONG TERM GOAL #5   Title  The patient will improve functional status survey from 50% to > or equal to 62% to demo improving functional mobility.    Time  8    Period  Weeks    Status  On-going      UPDATED LONG TERM GOALS: PT Long Term Goals - 03/03/17 2027      PT  LONG TERM GOAL #1   Title  The patient will be indep with progression of HEP.    Time  4    Period  Weeks    Status  Revised    Target Date  04/02/17      PT LONG TERM GOAL #2   Title  The patient will improve Berg from 48/56 to > or equal to 52/56 to demo improving high level balance tasks.    Time  4    Period  Weeks  Status  Revised    Target Date  04/02/17      PT LONG TERM GOAL #3   Title  The patient will verbalize return to community wellness/exercise class.    Time  4    Period  Weeks    Status  New    Target Date  04/02/17      PT LONG TERM GOAL #4   Title  The patient will improve gait speed from 2.57 ft/sec to > or equal to 3.0 ft/sec to demo improving functional mobility.    Time  4    Period  Weeks    Status  Revised    Target Date  04/02/17      PT LONG TERM GOAL #5   Title  The patient will improve functional status survey from 50% to > or equal to 62% to demo improving functional mobility.    Time  4    Period  Weeks    Status  Revised    Target Date  04/02/17            Plan - 03/03/17 2018    Clinical Impression Statement  The patient met 2 LTGs and partially met one LTG.  She has transitioned from RW use to no device in home and [facility] intermittent use in community.  PT recommended we continue 1x/week x 4 more weeks working towards continued balance training on community surfaces, gait training without device and transition back to community exercises.     PT Treatment/Interventions  ADLs/Self Care Home Management;Balance training;Neuromuscular re-education;Patient/family education;Gait training;Stair training;Functional mobility training;Therapeutic activities;Therapeutic exercise;Manual techniques    PT Next Visit Plan  Gait with SPC, gait without device, compliant surface training, dyanmic gait emphasizing narrow base of support.  Turning in place.      Consulted and Agree with Plan of Care  Patient       Patient will benefit from skilled  therapeutic intervention in order to improve the following deficits and impairments:  Abnormal gait, Decreased balance, Decreased strength, Difficulty walking  Visit Diagnosis: Other abnormalities of gait and mobility  Unsteadiness on feet  Muscle weakness (generalized)  Other symptoms and signs involving the nervous system     Problem List Patient Active Problem List   Diagnosis Date Noted  . Status post stroke 01/22/2017  . Anemia 01/22/2017  . 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 Acute ischemic stroke ([place])   . Benign essential HTN   . Dysarthria, post-stroke   . Leukocytosis   . Acute blood loss anemia   . 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 Mixed hyperlipidemia 06/03/2007  . Allergic rhinitis 06/03/2007  . VAGINITIS, ATROPHIC 06/03/2007    Monterrius Cardosa, PT 03/03/2017, 8:24 PM  [facility] [address] [place], [place], [address] Phone: [phone]   Fax:  [phone]  Name: [name] MRN: [id number] Date of Birth: 09/17/1940

## 2017-03-03 NOTE — Therapy (Signed)
[facility] [address] [place], [place], [address] Phone: [phone]   Fax:  [phone]  Speech Language Pathology Treatment  Patient Details  Name: [name] MRN: [id number] Date of Birth: 08-13-1940 Referring Provider: Alysia Penna, MD   Encounter Date: 03/03/2017  End of Session - 03/03/17 1008    Visit Number  8    Number of Visits  17    Date for SLP Re-Evaluation  03/05/17    SLP Start Time  0932    SLP Stop Time   1008    SLP Time Calculation (min)  36 min    Activity Tolerance  Patient tolerated treatment well       Past Medical History:  Diagnosis Date  . Allergic rhinitis 06/03/2007  . Arthritis   . Atrophic vaginitis   . 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 CVA (cerebral vascular accident) ([place])   . History of colon polyps   . HLD (hyperlipidemia)   . Hypertension   . Melanoma in situ [facility])     Past Surgical History:  Procedure Laterality Date  . BACK SURGERY    . CHOLECYSTECTOMY  2006  . IR GASTROSTOMY TUBE MOD SED  11/17/2016  . IR GASTROSTOMY TUBE REMOVAL  01/18/2017    There were no vitals filed for this visit.  Subjective Assessment - 03/03/17 0944    Subjective  "It's going pretty good, I just have to be careful."    Currently in Pain?  No/denies            ADULT SLP TREATMENT - 03/03/17 0945      General Information   Behavior/Cognition  Alert;Cooperative;Pleasant mood      Treatment Provided   Treatment provided  Dysphagia      Dysphagia Treatment   Temperature Spikes Noted  No    Respiratory Status  Room air    Treatment Methods  Skilled observation;Therapeutic exercise    Patient observed directly with PO's  Yes    Type of PO's observed  Dysphagia 3 (soft);Thin liquids    Liquids provided via  Straw    Oral Phase Signs & Symptoms  -- none noted    Pharyngeal Phase Signs & Symptoms  Delayed throat clear 1/10 boluses    Other  treatment/comments  Pt ate dys III item with thin liquid without SLP cues needed for precautions. SLP discussed possiblity of additional modified (MBSS) in 4-6 weeks but pt declined and stated "I think I'm really doing ok." Pt was independent today wiht HEP, including [place]. SLP reiterated to pt to cont to perform HEP BID until 04-12-17, then twice per week to maintain strength. She agreed with d/c today.      Assessment / Recommendations / Plan   Plan  Discharge SLP treatment due to (comment) independent with HEP, WFL swallowing      Dysphagia Recommendations   Diet recommendations  Dysphagia 3 (mechanical soft);Thin liquid    Medication Administration  Whole meds with liquid    Compensations  Slow rate;Small sips/bites;Multiple dry swallows after each bite/sip      Progression Toward Goals   Progression toward goals  -- d/c day - see goal summary       SLP Education - 03/03/17 1008    Education provided  Yes    Education Details  BID HEP until 04-12-17, then twice a week    Person(s) Educated  Patient    Methods  Explanation  Comprehension  Verbalized understanding       SLP Short Term Goals - 02/24/17 1054      SLP SHORT TERM GOAL #1   Title  pt will complete HEP with modified independence over 3 sessions    Baseline  01-21-17    Status  Partially Met      SLP SHORT TERM GOAL #2   Title  pt will use any recommended safe swallow strategies following modified barium swallow (MBSS) during 3 ST sessions    Status  Achieved      SLP SHORT TERM GOAL #3   Title  pt will thicken liqiuds to prescribed consistency with modified independence, PRN following MBSS    Status  Deferred thin recommended       SLP Long Term Goals - 03/03/17 1010      SLP LONG TERM GOAL #1   Title  pt will perform swallow HEP correctly with modified independence over 4 sessions    Status  Partially Met 3/4 sessions      SLP LONG TERM GOAL #2   Title  pt will adhere to any safe swallow strategies with  POs during 4 ST sessions with modified independence    Status  Achieved      SLP LONG TERM GOAL #3   Title  pt will thicken liquids (PRN) to prescrbed consistency with modified independence over 3 sessions    Status  Deferred thin recommended      SLP LONG TERM GOAL #4   Title  pt will demo correct liquid presentation (not piecemealing liquids) with solids over three therapy sessions    Status  Achieved       Plan - 03/03/17 1009    Clinical Impression Statement   Pt is independent with HEP, including Mendelsohn. Is independent with precautions with POs and was functional with POs. No overt s/s aspiration PNA, nor any reported to SLP during entire ST course. d/c today - pt in agreement.    Treatment/Interventions  Aspiration precaution training;Pharyngeal strengthening exercises;Diet toleration management by SLP;Trials of upgraded texture/liquids;Internal/external aids;Patient/family education;Compensatory strategies;SLP instruction and feedback;Oral motor exercises;Cueing hierarchy;Environmental controls;NMES any or all may be used    Potential to Achieve Goals  Good       Patient will benefit from skilled therapeutic intervention in order to improve the following deficits and impairments:   Dysphagia, oropharyngeal phase   SPEECH THERAPY DISCHARGE SUMMARY  Visits from Start of Care: 8  Current functional level related to goals / functional outcomes: Pt's swallowing function responded wonderfully to skilled ST focusing on dysphagia HEP, as well as following precautions with POs. See goal update for details.   Remaining deficits: Mild dysphagia. Pt has been told to cont HEP BID until 04-12-17 then twice a week to maintain swallow strength and coordination. She does not want to pursue another modified (MBSS).   Education / Equipment: Swallow precautions, HEP. Plan: Patient agrees to discharge.  Patient goals were partially met. Patient is being discharged due to meeting the stated  rehab goals.  ?????and being pleased with functional level.        Problem List Patient Active Problem List   Diagnosis Date Noted  . Status post stroke 01/22/2017  . Anemia 01/22/2017  . 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 Acute ischemic stroke ([place])   . Benign essential HTN   . Dysarthria, post-stroke   . Leukocytosis   . Acute blood loss anemia   . 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 Mixed hyperlipidemia 06/03/2007  . Allergic rhinitis 06/03/2007  . VAGINITIS, ATROPHIC 06/03/2007    [facility] ,MS, CCC-SLP  03/03/2017, 10:11 AM  [facility] [address] [place], [place], [address] Phone: [phone]   Fax:  [phone]   Name: [name] MRN: [id number] Date of Birth: [birth year]

## 2017-03-03 NOTE — Patient Instructions (Signed)
Habituation - Tip Card  1.The goal of habituation training is to assist in decreasing symptoms of vertigo, dizziness, or nausea provoked by specific head and body motions. 2.These exercises may initially increase symptoms; however, be persistent and work through symptoms. With repetition and time, the exercises will assist in reducing or eliminating symptoms. 3.Exercises should be stopped and discussed with the therapist if you experience any of the following: - Sudden change or fluctuation in hearing - New onset of ringing in the ears, or increase in current intensity - Any fluid discharge from the ear - Severe pain in neck or back - Extreme nausea  Copyright  VHI. All rights reserved.  Habituation - Sit to Side-Lying   Sit on edge of bed. Lie down onto the right side and hold until dizziness (room movement sensation) settles.  Return to sitting and wait until dizziness settles.  Repeat to the left side. Repeat sequence 5 times per session. Do 2 sessions per day.  Copyright  VHI. All rights reserved.    Feet Apart, Varied Arm Positions - Eyes Closed    Stand with feet shoulder width apart and arms at your side.  Close eyes and visualize upright position. Hold __30__ seconds. Repeat __3__ times per session. Do ____ sessions per day.  Copyright  VHI. All rights reserved.   Feet Together, Head Motion - Eyes Open    With eyes open, feet together, move head slowly: side to side 5-10 times. Do _2___ sessions per day. Copyright  VHI. All rights reserved.   Feet Partial Heel-Toe, Varied Arm Positions - Eyes Open    With eyes open, right foot partially in front of the other, arms out, look straight ahead at a stationary object. Hold _30___ seconds, then switch feet.  Repeat __3__ times per session. Do __2__ sessions per day.  Copyright  VHI. All rights reserved.   SINGLE LIMB STANCE    Stance: single leg on floor. Raise leg. Hold _10__ seconds. Repeat with other  leg. _3__ reps per set, _2__ sets per day. HAVE ONE HAND/FINGERTIPS TOUCHING COUNTERTOP FOR SUPPORT.  Copyright  VHI. All rights reserved.

## 2017-03-09 ENCOUNTER — Encounter: Payer: Medicare Other | Admitting: Speech Pathology

## 2017-03-10 ENCOUNTER — Encounter: Payer: Self-pay | Admitting: Rehabilitative and Restorative Service Providers"

## 2017-03-10 ENCOUNTER — Ambulatory Visit: Payer: Medicare Other

## 2017-03-10 ENCOUNTER — Ambulatory Visit: Payer: Medicare Other | Admitting: Rehabilitative and Restorative Service Providers"

## 2017-03-10 DIAGNOSIS — M6281 Muscle weakness (generalized): Secondary | ICD-10-CM

## 2017-03-10 DIAGNOSIS — R29818 Other symptoms and signs involving the nervous system: Secondary | ICD-10-CM

## 2017-03-10 DIAGNOSIS — R1312 Dysphagia, oropharyngeal phase: Secondary | ICD-10-CM | POA: Diagnosis not present

## 2017-03-10 DIAGNOSIS — R2689 Other abnormalities of gait and mobility: Secondary | ICD-10-CM

## 2017-03-10 DIAGNOSIS — R2681 Unsteadiness on feet: Secondary | ICD-10-CM

## 2017-03-10 NOTE — Patient Instructions (Signed)
Habituation - Tip Card  1.The goal of habituation training is to assist in decreasing symptoms of vertigo, dizziness, or nausea provoked by specific head and body motions. 2.These exercises may initially increase symptoms; however, be persistent and work through symptoms. With repetition and time, the exercises will assist in reducing or eliminating symptoms. 3.Exercises should be stopped and discussed with the therapist if you experience any of the following: - Sudden change or fluctuation in hearing - New onset of ringing in the ears, or increase in current intensity - Any fluid discharge from the ear - Severe pain in neck or back - Extreme nausea  Copyright  VHI. All rights reserved.  Habituation - Sit to Side-Lying   Sit on edge of bed. Lie down onto the right side and hold until dizziness (room movement sensation) settles.  Return to sitting and wait until dizziness settles.  Repeat to the left side. Repeat sequence 5 times per session. Do 2 sessions per day.  Copyright  VHI. All rights reserved.    Feet Apart, Varied Arm Positions - Eyes Closed    Stand with feet shoulder width apart and arms at your side. Close eyes and visualize upright position. Hold __30__ seconds. Repeat __3__ times per session. Do ____ sessions per day.  Copyright  VHI. All rights reserved.  Feet Together, Head Motion - Eyes Open    With eyes open, feet together, move head slowly: side to side 5-10 times. Do _2___ sessions per day. Copyright  VHI. All rights reserved.  Feet Partial Heel-Toe, Varied Arm Positions - Eyes Open    With eyes open, right foot partially in front of the other, arms out, look straight ahead at a stationary object. Hold _30___ seconds, then switch feet.  Repeat __3__ times per session. Do __2__ sessions per day.  Copyright  VHI. All rights reserved.  SINGLE LIMB STANCE    Stance: single leg on floor. Raise leg. Hold _10__ seconds. Repeat with other  leg. _3__ reps per set, _2__ sets per day. HAVE ONE HAND/FINGERTIPS TOUCHING COUNTERTOP FOR SUPPORT.  Copyright  VHI. All rights reserved.   AROM: Lateral Neck Flexion    Slowly tilt head toward one shoulder, then the other. Hold each position _10___ seconds. Repeat _3___ times per set. Do _1___ sets per session. Do __1__ sessions per day.  http://orth.exer.us/297   Copyright  VHI. All rights reserved.   Wall Slide    Keep head, shoulders, and back against wall, with feet out in front and slightly wider than shoulder width. Slowly lower buttocks by sliding down wall until thighs are parallel to floor. Keep back flat. Repeat __10__ times per set. Do __1__ sets per session. Do __1__ sessions per day.  http://orth.exer.us/153   Copyright  VHI. All rights reserved.

## 2017-03-10 NOTE — Therapy (Signed)
[facility] [address] [place], [place], [address] Phone: [phone]   Fax:  [phone]  Physical Therapy Treatment  Patient Details  Name: [name] MRN: [id number] Date of Birth: [birth year] Referring Provider: Alysia Penna   Encounter Date: 03/10/2017  PT End of Session - 03/10/17 1511    Visit Number  10    Number of Visits  17 eval + 16 visits    Date for PT Re-Evaluation  04/02/17    PT Start Time  1021    PT Stop Time  1101    PT Time Calculation (min)  40 min    Equipment Utilized During Treatment  Gait belt    Activity Tolerance  Patient tolerated treatment well    Behavior During Therapy  [facility] for tasks assessed/performed       Past Medical History:  Diagnosis Date  . Allergic rhinitis 06/03/2007  . Anxiety   . Arthritis   . Atrophic vaginitis   . 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 CVA (cerebral vascular accident) ([place])   . History of colon polyps   . HLD (hyperlipidemia)   . Hypertension   . Low back pain   . Melanoma in situ ([place])   . Vitamin D deficiency     Past Surgical History:  Procedure Laterality Date  . BACK SURGERY    . CHOLECYSTECTOMY  2006  . IR GASTROSTOMY TUBE MOD SED  11/17/2016  . IR GASTROSTOMY TUBE REMOVAL  01/18/2017  . S/P Hysterectomy  1990    There were no vitals filed for this visit.  Subjective Assessment - 03/10/17 1021    Subjective  The patient reports she lifted water package at store and strained her back.  She used a heating pad yesterday and it feels better today.  She notes her neck hurts after oding habituation exercises.   She has been getting out in the community at church, [employer], grocery shopping, etc.     Patient Stated Goals  "Walk on my own" without rolling walker outdoors.    Currently in Pain?  No/denies *Neck always bothers me if I turn"                      [facility] Adult PT Treatment/Exercise -  03/10/17 1044      Ambulation/Gait   Ambulation/Gait  Yes    Ambulation/Gait Assistance  6: Modified independent (Device/Increase time)    Ambulation/Gait Assistance Details  Ambulated without device mod indep at slowed pace in clinic x 400 ft.  Performed dynamic gait activities including ball toss, turning R<>L to identify playing cards and #s, walking backwards, direction changes, and tandem gait (with min A with intermittent UE support)    Assistive device  None;Straight cane    Ambulation Surface  Level;Indoor      Neuro Re-ed    Neuro Re-ed Details   Reviewed habituation exercises with no dizziness noted; performed marching activities for balance.      Exercises   Exercises  Other Exercises    Other Exercises   Wall slides x 10 reps, seated upper trapezius stretch.      Manual Therapy   Manual Therapy  Joint mobilization;Soft tissue mobilization;Manual Traction    Manual therapy comments  For left sided neck discomfort with dec'd AROM rotation noted during dynamic gait activities.     Joint Mobilization  mid cervical lateral glides, upglides on right side  Soft tissue mobilization  L cervical musculature and upper trap    Manual Traction  gentle for relaxation             PT Education - 03/10/17 1510    Education provided  Yes    Education Details  added wall squat and upper trap stretch to current HEP    Person(s) Educated  Patient    Methods  Explanation;Demonstration;Handout    Comprehension  Returned demonstration;Verbalized understanding       PT Short Term Goals - 01/28/17 0939      PT SHORT TERM GOAL #1   Title  The patient will be indep with HEP for balance and general mobility.    Baseline  met 01/28/17 (did them 2 sessions ago, verbalizes she is independent at home)    Time  4    Period  Weeks    Status  Achieved      PT SHORT TERM GOAL #2   Title  The patient will improve gait speed from 2.38 ft/sec to > or equal to 2.62 ft/sec to demo transition to  "full community ambulator" classification of gait.    Baseline  2.71 ft/sec with rollator and 2.46 ft/sec with cane    Time  4    Period  Weeks    Status  Achieved      PT SHORT TERM GOAL #3   Title  The patient will improve TUG from 19.91 seconds to < or equal to 16 seconds to demo improving functional mobility.    Baseline  15.69 with cane, 13.50 with rollator    Time  4    Period  Weeks    Status  Achieved      PT SHORT TERM GOAL #4   Title  The patient will improve Berg from 39/56 to > or equal to 44/56 to demo dec'ing risk for falls.    Baseline  46/56 on 01/28/17    Time  4    Period  Weeks    Status  Achieved      PT SHORT TERM GOAL #5   Title  The patient will ambulate x 250 ft in the clinic without loss of balance to demo improved safety for household ambulation.    Baseline  met with rollator     Time  4    Period  Weeks    Status  Achieved        PT Long Term Goals - 03/03/17 2027      PT LONG TERM GOAL #1   Title  The patient will be indep with progression of HEP.    Time  4    Period  Weeks    Status  Revised    Target Date  04/02/17      PT LONG TERM GOAL #2   Title  The patient will improve Berg from 48/56 to > or equal to 52/56 to demo improving high level balance tasks.    Time  4    Period  Weeks    Status  Revised    Target Date  04/02/17      PT LONG TERM GOAL #3   Title  The patient will verbalize return to community wellness/exercise class.    Time  4    Period  Weeks    Status  New    Target Date  04/02/17      PT LONG TERM GOAL #4   Title  The patient will improve gait  speed from 2.57 ft/sec to > or equal to 3.0 ft/sec to demo improving functional mobility.    Time  4    Period  Weeks    Status  Revised    Target Date  04/02/17      PT LONG TERM GOAL #5   Title  The patient will improve functional status survey from 50% to > or equal to 62% to demo improving functional mobility.    Time  4    Period  Weeks    Status  Revised     Target Date  04/02/17            Plan - 03/10/17 1514    Clinical Impression Statement  The patient c/o neck pain noted today and had joint restrictions limiting left cervical ROM noted during dynamic gait activities.  PT addressing as part of  dynamic activities.      Rehab Potential  Good    PT Frequency  1x / week    PT Duration  4 weeks    PT Treatment/Interventions  ADLs/Self Care Home Management;Balance training;Neuromuscular re-education;Patient/family education;Gait training;Stair training;Functional mobility training;Therapeutic activities;Therapeutic exercise;Manual techniques    PT Next Visit Plan  Work on neck mobility, Gait with SPC, gait without device, compliant surface training, dyanmic gait emphasizing narrow base of support.  Turning in place.      Consulted and Agree with Plan of Care  Patient       Patient will benefit from skilled therapeutic intervention in order to improve the following deficits and impairments:  Abnormal gait, Decreased balance, Decreased strength, Difficulty walking  Visit Diagnosis: Other abnormalities of gait and mobility  Unsteadiness on feet  Muscle weakness (generalized)  Other symptoms and signs involving the nervous system     Problem List Patient Active Problem List   Diagnosis Date Noted  . Status post stroke 01/22/2017  . Anemia 01/22/2017  . 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 Acute ischemic stroke ([place])   . Benign essential HTN   . Dysarthria, post-stroke   . Leukocytosis   . Acute blood loss anemia   . 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 Mixed hyperlipidemia 06/03/2007  . Allergic rhinitis 06/03/2007  . VAGINITIS, ATROPHIC 06/03/2007    Patriciaann Rabanal, PT 03/10/2017, 3:16 PM  [facility] [address] [place], [place], [address] Phone: [phone]   Fax:   [phone]  Name: EDGAR CORRIGAN MRN: [id number] Date of Birth: 03/02/1940

## 2017-03-24 ENCOUNTER — Ambulatory Visit
Payer: Medicare Other | Attending: Physical Medicine & Rehabilitation | Admitting: Rehabilitative and Restorative Service Providers"

## 2017-03-24 ENCOUNTER — Encounter: Payer: Self-pay | Admitting: Rehabilitative and Restorative Service Providers"

## 2017-03-24 VITALS — BP 166/97

## 2017-03-24 DIAGNOSIS — R29818 Other symptoms and signs involving the nervous system: Secondary | ICD-10-CM | POA: Diagnosis present

## 2017-03-24 DIAGNOSIS — R2681 Unsteadiness on feet: Secondary | ICD-10-CM | POA: Diagnosis present

## 2017-03-24 DIAGNOSIS — M6281 Muscle weakness (generalized): Secondary | ICD-10-CM | POA: Diagnosis present

## 2017-03-24 DIAGNOSIS — R2689 Other abnormalities of gait and mobility: Secondary | ICD-10-CM | POA: Diagnosis present

## 2017-03-24 NOTE — Therapy (Signed)
[facility] [address] [place], [place], [address] Phone: [phone]   Fax:  [phone]  Physical Therapy Treatment and Discharge Summary  Patient Details  Name: [name] MRN: [id number] Date of Birth: 1940/04/26 Referring Provider: Alysia Penna   Encounter Date: 03/24/2017  PT End of Session - 03/24/17 1209    Visit Number  11    Number of Visits  17    Date for PT Re-Evaluation  04/02/17    PT Start Time  1153    PT Stop Time  1225    PT Time Calculation (min)  32 min    Equipment Utilized During Treatment  Gait belt    Activity Tolerance  Patient tolerated treatment well    Behavior During Therapy  [facility] for tasks assessed/performed       Past Medical History:  Diagnosis Date  . Allergic rhinitis 06/03/2007  . Anxiety   . Arthritis   . Atrophic vaginitis   . 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 CVA (cerebral vascular accident) ([place])   . History of colon polyps   . HLD (hyperlipidemia)   . Hypertension   . Low back pain   . Melanoma in situ ([place])   . Vitamin D deficiency     Past Surgical History:  Procedure Laterality Date  . BACK SURGERY    . CHOLECYSTECTOMY  2006  . IR GASTROSTOMY TUBE MOD SED  11/17/2016  . IR GASTROSTOMY TUBE REMOVAL  01/18/2017  . S/P Hysterectomy  1990    Vitals:   03/24/17 1154  BP: (!) 166/97    Subjective Assessment - 03/24/17 1154    Subjective  The patient notes she had a good day yesterday and today is more unsteady.  She is walking at home in the parking area and to the mailbox.   No falls.   She reports that she is planing to return to [employer] on Friday if able.    Patient Stated Goals  "Walk on my own" without rolling walker outdoors.    Currently in Pain?  Yes    Pain Score  -- "a little tight this morning"         [facility] PT Assessment - 03/24/17 1215      Ambulation/Gait   Ambulation/Gait Assistance Details   Gait x 500 feet nonstop without device emphasizing greater speed, intermittent CGA during gait with horizontal head motion, walking with direction changes, backwards walking, etc.  Standing turns using visual targets to improve balance.    Assistive device  None;Straight cane    Ambulation Surface  Level;Indoor    Gait Comments  The patient and PT discussed safety during days that she feels greater unsteadiness.  PT recommended SPC use on those days and using visual fixation to help.       Standardized Balance Assessment   Standardized Balance Assessment  Berg Balance Test      Berg Balance Test   Sit to Stand  Able to stand without using hands and stabilize independently    Standing Unsupported  Able to stand safely 2 minutes    Sitting with Back Unsupported but Feet Supported on Floor or Stool  Able to sit safely and securely 2 minutes    Stand to Sit  Sits safely with minimal use of hands    Transfers  Able to transfer safely, minor use of hands    Standing Unsupported with Eyes Closed  Able to stand 10 seconds safely    Standing Ubsupported with Feet Together  Able to place feet together independently and stand 1 minute safely    From Standing, Reach Forward with Outstretched Arm  Can reach forward >12 cm safely (5")    From Standing Position, Pick up Object from [place] to pick up shoe safely and easily    From Standing Position, Turn to Look Behind Over each Shoulder  Looks behind from both sides and weight shifts well    Turn 360 Degrees  Able to turn 360 degrees safely but slowly    Standing Unsupported, Alternately Place Feet on Step/Stool  Able to stand independently and safely and complete 8 steps in 20 seconds    Standing Unsupported, One Foot in Front  Able to plae foot ahead of the other independently and hold 30 seconds    Standing on One Leg  Able to lift leg independently and hold equal to or more than 3 seconds    Total Score  50    Berg comment:  50/56                   [facility] Adult PT Treatment/Exercise - 03/24/17 1215      Ambulation/Gait   Ambulation/Gait  Yes    Ambulation/Gait Assistance  6: Modified independent (Device/Increase time);5: Supervision      Self-Care   Self-Care  Other Self-Care Comments    Other Self-Care Comments   Discussed transition to community.  The patient notes no concerns with ability to attend [employer] gym.  She feels comfortable with HEP and community access at this time.             PT Education - 03/24/17 1500    Education provided  Yes    Education Details  Discussed transition to community.  Also recommended patient log BP measurements    Person(s) Educated  Patient    Methods  Explanation    Comprehension  Verbalized understanding       PT Short Term Goals - 01/28/17 3094      PT SHORT TERM GOAL #1   Title  The patient will be indep with HEP for balance and general mobility.    Baseline  met 01/28/17 (did them 2 sessions ago, verbalizes she is independent at home)    Time  4    Period  Weeks    Status  Achieved      PT SHORT TERM GOAL #2   Title  The patient will improve gait speed from 2.38 ft/sec to > or equal to 2.62 ft/sec to demo transition to "full community ambulator" classification of gait.    Baseline  2.71 ft/sec with rollator and 2.46 ft/sec with cane    Time  4    Period  Weeks    Status  Achieved      PT SHORT TERM GOAL #3   Title  The patient will improve TUG from 19.91 seconds to < or equal to 16 seconds to demo improving functional mobility.    Baseline  15.69 with cane, 13.50 with rollator    Time  4    Period  Weeks    Status  Achieved      PT SHORT TERM GOAL #4   Title  The patient will improve Berg from 39/56 to > or equal to 44/56 to demo dec'ing risk for falls.    Baseline  46/56 on 01/28/17    Time  4    Period  Weeks    Status  Achieved      PT SHORT TERM GOAL #5   Title  The patient will ambulate x 250 ft in the clinic without loss of  balance to demo improved safety for household ambulation.    Baseline  met with rollator     Time  4    Period  Weeks    Status  Achieved        PT Long Term Goals - 03/24/17 1205      PT LONG TERM GOAL #1   Title  The patient will be indep with progression of HEP.    Baseline  met on 03/24/17    Time  4    Period  Weeks    Status  Achieved      PT LONG TERM GOAL #2   Title  The patient will improve Berg from 48/56 to > or equal to 52/56 to demo improving high level balance tasks.    Baseline  50/56 on 03/24/2017    Time  4    Period  Weeks    Status  Partially Met      PT LONG TERM GOAL #3   Title  The patient will verbalize return to community wellness/exercise class.    Baseline  Patient is doing home video of exercises- plans to return once PT ends to [employer] gym.    Time  4    Period  Weeks    Status  Achieved      PT LONG TERM GOAL #4   Title  The patient will improve gait speed from 2.57 ft/sec to > or equal to 3.0 ft/sec to demo improving functional mobility.    Baseline  2.60 ft/sec-- demonstrates a plateau of gait speed at this time.    Time  4    Period  Weeks    Status  Not Met      PT LONG TERM GOAL #5   Title  The patient will improve functional status survey from 50% to > or equal to 62% to demo improving functional mobility.    Baseline  70%    Time  4    Period  Weeks    Status  Achieved            Plan - 03/24/17 1506    Clinical Impression Statement  The patient met 3 LTGs and partially met 1 LTG.  Gait speed goal not met with only minimal change since last measure.  Patient notes feeling comfortable with mobility and having the ability to transition to community exercise class.  She has returned to walking in the community and performing IADLs mod indep.      PT Treatment/Interventions  ADLs/Self Care Home Management;Balance training;Neuromuscular re-education;Patient/family education;Gait training;Stair training;Functional mobility  training;Therapeutic activities;Therapeutic exercise;Manual techniques    PT Next Visit Plan  Discharge today.    Consulted and Agree with Plan of Care  Patient       Patient will benefit from skilled therapeutic intervention in order to improve the following deficits and impairments:  Abnormal gait, Decreased balance, Decreased strength, Difficulty walking  Visit Diagnosis: Other abnormalities of gait and mobility  Unsteadiness on feet  Muscle weakness (generalized)  Other symptoms and signs involving the nervous system    PHYSICAL THERAPY DISCHARGE SUMMARY  Visits from Start of Care: 11  Current functional level related to goals / functional outcomes: See above   Remaining deficits: dec'd  balance during transitions including sit>walking and turns or unpredictable perturbations   Education / Equipment: Home program, community wellness, use of assistive device.  Plan: Patient agrees to discharge.  Patient goals were partially met. Patient is being discharged due to meeting the stated rehab goals.  ?????         Thank you for the referral of this patient. Rudell Cobb, MPT  [place] 03/24/2017, 3:10 PM  [facility] [address] [place], [place], [address] Phone: [phone]   Fax:  [phone]  Name: [name] MRN: [id number] Date of Birth: February 01, 1940

## 2017-03-24 NOTE — Patient Instructions (Signed)
DATE    BP READING     SYMPTOMS      March 24, 2017   166/ 97    More unsteadiness today

## 2017-03-31 ENCOUNTER — Encounter: Payer: Medicare Other | Admitting: Rehabilitative and Restorative Service Providers"

## 2017-04-07 ENCOUNTER — Other Ambulatory Visit: Payer: Self-pay | Admitting: Physical Medicine & Rehabilitation

## 2017-04-07 ENCOUNTER — Encounter: Payer: Medicare Other | Admitting: Rehabilitative and Restorative Service Providers"

## 2017-04-17 ENCOUNTER — Other Ambulatory Visit: Payer: Self-pay | Admitting: Family Medicine

## 2017-04-19 NOTE — Telephone Encounter (Signed)
MEDICATION:   PHARMACY:    IS THIS A 90 DAY SUPPLY : no  IS PATIENT OUT OF MEDICATION:   IF NOT; HOW MUCH IS LEFT:   LAST APPOINTMENT DATE: @3 /07/2017  NEXT APPOINTMENT DATE:@Visit  date not found  OTHER COMMENTS:    **Let patient know to contact pharmacy at the end of the day to make sure medication is ready. **  ** Please notify patient to allow 48-72 hours to process**  **Encourage patient to contact the pharmacy for refills or they can [facility]**

## 2017-05-03 ENCOUNTER — Ambulatory Visit: Payer: Medicare Other | Admitting: Neurology

## 2017-07-04 ENCOUNTER — Other Ambulatory Visit: Payer: Self-pay | Admitting: Physical Medicine & Rehabilitation

## 2017-07-07 ENCOUNTER — Other Ambulatory Visit: Payer: Self-pay | Admitting: Family Medicine

## 2017-07-07 NOTE — Telephone Encounter (Signed)
Please advise on refill.

## 2017-07-28 ENCOUNTER — Encounter: Payer: Self-pay | Admitting: Adult Health

## 2017-07-28 ENCOUNTER — Ambulatory Visit: Payer: Medicare Other | Admitting: Adult Health

## 2017-07-28 VITALS — BP 148/90 | HR 106 | Ht 60.0 in | Wt 141.8 lb

## 2017-07-28 DIAGNOSIS — I63012 Cerebral infarction due to thrombosis of left vertebral artery: Secondary | ICD-10-CM

## 2017-07-28 DIAGNOSIS — E782 Mixed hyperlipidemia: Secondary | ICD-10-CM | POA: Diagnosis not present

## 2017-07-28 DIAGNOSIS — I10 Essential (primary) hypertension: Secondary | ICD-10-CM | POA: Diagnosis not present

## 2017-07-28 MED ORDER — CLOPIDOGREL BISULFATE 75 MG PO TABS: 75.0000 mg | ORAL_TABLET | Freq: Every day | ORAL | 4 refills | Status: DC

## 2017-07-28 NOTE — Progress Notes (Signed)
[facility] [address] [place]. [place] [address] [phone]       OFFICE FOLLOW-UP NOTE  Ms. [name] Date of Birth:  11-17-1940 Medical Record Number:  [id number]   HPI: [name] is a pleasant 77 year old Caucasian lady seen today for the first office follow-up visit following hospital admission for stroke in October 2018. History is obtained from the patient and review of electronic medical records. I have personally reviewed imaging films.Donna Widmer Murrayis an 77 y.o.femalepresenting to the [facility] via [facility] after a short syncopal spell at church, which was followed by gradually worsening confusion and agitation. She had been walking out of church, when the symptoms began. Prior to her syncopal episode, she stated that she felt dizzy. She was noted to be hot and clammy, then vomited. Zofran 4 mg was administered en route. In the ED a Code Stroke was called for acute confusion. No facial droop or limb weakness was noted. Patient was not administered IV t-PA secondary to initial presentation was more concerning for encephalopathy than acute infarct. CT scan of the head on admission showed no acute abnormality. CT angiogram showed age indeterminate occlusion of the left vertebral artery at the V1-32 junction was otherwise unremarkable. MRI scan of the brain showed several small areas of acute infarcts involving left middle and scattered involvement of the left greater than right mid to inferior cerebellar hemispheres as well as vermis. 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 Transthoracic echo showed normal ejection fraction without cardiac source of embolism. LDL cholesterol was elevated 134 mg percent and hemoglobin A1c was 5.8%. Cardiac monitoring during the hospitalization showed no arrhythmias. Patient had mild left finger-to-nose dysmetria and hoarseness of voice and dysphagia. Patient was started on dual antiplatelet therapy. She required a PEG tube. Patient  says she'll conversant discharge. Her dysphagia has improved and feeding tube has come out. Her voice is improved as well. She still has some intermittent numbness in her hands. She is currently part sparing in outpatient physical and speech therapy but occupational therapy has discharged her. She uses a wheeled walker for long distances but can use a cane for short distances. She states her balance is improving and she has had no recent falls. She does complain of increased bruising on aspirin and Plavix. She has no new complaints. She denied any significant head or neck injury or fall , whiplash or motor vehicle accident or chiropractic manipulation parietal stroke  07/28/17 UPDATE: Patient is being seen today for 16-month follow-up visit.  She states she still has slight right-sided weakness with decreased sensation but this has been improving.  She has completed physical therapy but continues exercise classes 3 times per week.  She does use a cane to assist her only when she is in crowded places due to balance issues.  Continues to take aspirin despite recommending stopping aspirin and continuing Plavix only but patient states she misunderstood directions.  Denies bleeding or bruising with continuation of aspirin.   continues Lipitor without side effects myalgias.  Blood pressure today mildly elevated at 148/90.  Patient does not monitor this at home but states that she will start to monitor.  Denies new or worsening stroke/TIA symptoms.   ROS:   14 system review of systems is positive for cough and all other systems negative  PMH:  Past Medical History:  Diagnosis Date  . Allergic rhinitis 06/03/2007  . Anxiety   . Arthritis   . Atrophic vaginitis   . Basal cell  carcinoma (BCC) in situ of skin   . CVA (cerebral vascular accident) (HCC)   . History of colon polyps   . HLD (hyperlipidemia)   . Hypertension   . Low back pain   . Melanoma in situ (HCC)   . Vitamin D deficiency     Social  History:  Social History   Socioeconomic History  . Marital status: Widowed    Spouse name: Not on file  . Number of children: Not on file  . Years of education: Not on file  . Highest education level: Not on file  Occupational History  . Not on file  Social Needs  . Financial resource strain: Not on file  . Food insecurity:    Worry: Not on file    Inability: Not on file  . Transportation needs:    Medical: Not on file    Non-medical: Not on file  Tobacco Use  . Smoking status: Never Smoker  . Smokeless tobacco: Never Used  Substance and Sexual Activity  . Alcohol use: No  . Drug use: No  . Sexual activity: Never  Lifestyle  . Physical activity:    Days per week: Not on file    Minutes per session: Not on file  . Stress: Not on file  Relationships  . Social connections:    Talks on phone: Not on file    Gets together: Not on file    Attends religious service: Not on file    Active member of club or organization: Not on file    Attends meetings of clubs or organizations: Not on file    Relationship status: Not on file  . Intimate partner violence:    Fear of current or ex partner: Not on file    Emotionally abused: Not on file    Physically abused: Not on file    Forced sexual activity: Not on file  Other Topics Concern  . Not on file  Social History Narrative   ** Merged History Encounter **        Medications:   Current Outpatient Medications on File Prior to Visit  Medication Sig Dispense Refill  . aspirin 325 MG tablet Place 1 tablet (325 mg total) daily into feeding tube. (Patient taking differently: Take 325 mg by mouth daily. )    . atorvastatin (LIPITOR) 40 MG tablet TAKE 1 TABLET BY MOUTH DAILY AT 6 PM 90 tablet 0  . clopidogrel (PLAVIX) 75 MG tablet Take 1 tablet (75 mg total) by mouth daily. 90 tablet 0  . sertraline (ZOLOFT) 50 MG tablet TAKE 1 TABLET BY MOUTH DAILY 90 tablet 0  . traMADol (ULTRAM) 50 MG tablet TAKE 1 TABLET BY MOUTH TWICE DAILY  AS NEEDED 60 tablet 0   No current facility-administered medications on file prior to visit.     Allergies:   Allergies  Allergen Reactions  . Lisinopril Other (See Comments)    Dizziness and felt odd--cough     Physical Exam General: well developed, well nourished pleasant elderly Caucasian lady, seated, in no evident distress Head: head normocephalic and atraumatic.  Neck: supple with no carotid or supraclavicular bruits Cardiovascular: regular rate and rhythm, no murmurs Musculoskeletal: no deformity Skin:  no rash/petichiae Vascular:  Normal pulses all extremities Vitals:   07/28/17 1109  BP: (!) 148/90  Pulse: (!) 106  SpO2: 97%   Neurologic Exam Mental Status: Awake and fully alert. Oriented to place and time. Recent and remote memory intact. Attention span, concentration and  fund of knowledge appropriate. Mood and affect appropriate.  Cranial Nerves: Fundoscopic exam reveals sharp disc margins. Pupils equal, briskly reactive to light. Extraocular movements full without nystagmus. Visual fields full to confrontation. Hearing intact. Facial sensation intact. Face, tongue, palate moves normally and symmetrically.  Motor: Normal bulk and tone. Normal strength in all tested extremity muscles. Sensory.: intact to touch ,pinprick .position and vibratory sensation.  Coordination: Rapid alternating movements normal in all extremities. Finger-to-nose and heel-to-shin performed accurately bilaterally.  Decreased right finger dexterity Gait and Station: Arises from chair without difficulty. Stance is slightly broad-based but overall is able to ambulate independently with only occasional assistance of cane.  Reflexes: 1+ and symmetric. Toes downgoing.    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 Vascular risk factors of hypertension hyperlipidemia and interpreted atherosclerosis.  Patient returns today for  follow-up visit and overall continues to improve.    PLAN: -Recommended to stop aspirin 325 mg daily and restart Plavix 75 mg daily and continue Lipitor for secondary stroke prevention -Lipid panel collected today as lipid panel has not been obtained since 10/2016 -F/u with PCP regarding your HLD and HTN management -patient aware for PCP to take over medication management at this time -continue to monitor BP at home -Advised to continue to stay active with exercise classes and maintain healthy diet -low-cholesterol food packet provided -Maintain strict control of hypertension with blood pressure goal below 130/90, diabetes with hemoglobin A1c goal below 6.5% and cholesterol with LDL cholesterol (bad cholesterol) goal below 70 mg/dL. I also advised the patient to eat a healthy diet with plenty of whole grains, cereals, fruits and vegetables, exercise regularly and maintain ideal body weight.  Follow up in 6 months or call earlier if needed  Greater than 50% of time during this 25 minute visit was spent on counseling,explanation of diagnosis left vertebral artery occlusion and brainstem stroke, planning of further management, discussion with patient and family and coordination of care   George HughJessica VanSchaick, [facility]GNP-BC  [facility] [address] [place], [place] [id number]  Phone [phone] Fax [phone] Note: This document was prepared with digital dictation and possible smart phrase technology. Any transcriptional errors that result from this process are unintentional.

## 2017-07-28 NOTE — Patient Instructions (Signed)
Stop aspirin and restart clopidogrel 75 mg daily  and continue lipitor 40mg   for secondary stroke prevention  We will check cholesterol levels today  Continue to follow up with PCP regarding cholesterol and blood pressure management   Continue to stay active and eat healthy  Continue to monitor blood pressure at home  Maintain strict control of hypertension with blood pressure goal below 130/90, diabetes with hemoglobin A1c goal below 6.5% and cholesterol with LDL cholesterol (bad cholesterol) goal below 70 mg/dL. I also advised the patient to eat a healthy diet with plenty of whole grains, cereals, fruits and vegetables, exercise regularly and maintain ideal body weight.  Followup in the future with me in 6 months or call earlier if needed        [facility] you [facility]. I hope we have been able to provide you high quality care today.  You may receive a patient satisfaction survey over the next few weeks. We would appreciate your feedback and comments so that we may continue to improve ourselves and the health of our patients.

## 2017-07-29 LAB — LIPID PANEL
Chol/HDL Ratio: 2.5 ratio (ref 0.0–4.4)
Cholesterol, Total: 131 mg/dL (ref 100–199)
HDL: 53 mg/dL (ref >39)
LDL Calculated: 67 mg/dL (ref 0–99)
Triglycerides: 57 mg/dL (ref 0–149)
VLDL CHOLESTEROL CAL: 11 mg/dL (ref 5–40)

## 2017-07-31 NOTE — Progress Notes (Signed)
I agree with the above plan 

## 2017-10-02 ENCOUNTER — Other Ambulatory Visit: Payer: Self-pay | Admitting: Family Medicine

## 2017-10-12 ENCOUNTER — Other Ambulatory Visit: Payer: Self-pay | Admitting: Family Medicine

## 2018-02-01 ENCOUNTER — Encounter: Payer: Self-pay | Admitting: Adult Health

## 2018-02-01 ENCOUNTER — Ambulatory Visit: Payer: Medicare Other | Admitting: Adult Health

## 2018-02-01 VITALS — BP 164/87 | HR 90 | Ht 60.0 in | Wt 144.8 lb

## 2018-02-01 DIAGNOSIS — I63012 Cerebral infarction due to thrombosis of left vertebral artery: Secondary | ICD-10-CM | POA: Diagnosis not present

## 2018-02-01 DIAGNOSIS — I10 Essential (primary) hypertension: Secondary | ICD-10-CM

## 2018-02-01 DIAGNOSIS — E782 Mixed hyperlipidemia: Secondary | ICD-10-CM

## 2018-02-01 NOTE — Progress Notes (Signed)
[facility] [address] [place]. [place] [address] [phone]       OFFICE FOLLOW-UP NOTE  [name] Date of Birth:  1940-10-13 Medical Record Number:  [id number]   HPI: [name] is a pleasant 78 year old Caucasian lady seen today for the first office follow-up visit following hospital admission for stroke in October 2018. History is obtained from the patient and review of electronic medical records. I have personally reviewed imaging films.Donna Bielawski Murrayis an 78 y.o.femalepresenting to the [facility] ED via EMS after a short syncopal spell at church, which was followed by gradually worsening confusion and agitation. She had been walking out of church, when the symptoms began. Prior to her syncopal episode, she stated that she felt dizzy. She was noted to be hot and clammy, then vomited. Zofran 4 mg was administered en route. In the ED a Code Stroke was called for acute confusion. No facial droop or limb weakness was noted. Patient was not administered IV t-PA secondary to initial presentation was more concerning for encephalopathy than acute infarct. CT scan of the head on admission showed no acute abnormality. CT angiogram showed age indeterminate occlusion of the left vertebral artery at the V1-32 junction was otherwise unremarkable. MRI scan of the brain showed several small areas of acute infarcts involving left middle and scattered involvement of the left greater than right mid to inferior cerebellar hemispheres as well as vermis. 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 Transthoracic echo showed normal ejection fraction without cardiac source of embolism. LDL cholesterol was elevated 134 mg percent and hemoglobin A1c was 5.8%. Cardiac monitoring during the hospitalization showed no arrhythmias. Patient had mild left finger-to-nose dysmetria and hoarseness of voice and dysphagia. Patient was started on dual antiplatelet therapy. She required a PEG tube. Patient  says she'll conversant discharge. Her dysphagia has improved and feeding tube has come out. Her voice is improved as well. She still has some intermittent numbness in her hands. She is currently part sparing in outpatient physical and speech therapy but occupational therapy has discharged her. She uses a wheeled walker for long distances but can use a cane for short distances. She states her balance is improving and she has had no recent falls. She does complain of increased bruising on aspirin and Plavix. She has no new complaints. She denied any significant head or neck injury or fall , whiplash or motor vehicle accident or chiropractic manipulation parietal stroke  07/28/17 UPDATE: Patient is being seen today for 68-month follow-up visit.  She states she still has slight right-sided weakness with decreased sensation but this has been improving.  She has completed physical therapy but continues exercise classes 3 times per week.  She does use a cane to assist her only when she is in crowded places due to balance issues.  Continues to take aspirin despite recommending stopping aspirin and continuing Plavix only but patient states she misunderstood directions.  Denies bleeding or bruising with continuation of aspirin.   continues Lipitor without side effects myalgias.  Blood pressure today mildly elevated at 148/90.  Patient does not monitor this at home but states that she will start to monitor.  Denies new or worsening stroke/TIA symptoms.  Interval history 02/01/18: Patient is being seen today for follow up visit. She has been doing well from a stroke stand point. She does continue to have balance difficulties but is able to ambulate without a cane. Right side 95% improved. Sensation is still decreased compared to left side.  She does exercise classes 3x weekly for 50 minutes at [employer]. Plavix with mild bruising but no bleeding. Continues on lipitor without myalgias. Lipid panel on 07/28/17 with LDL 67. Blood  pressure today 164/87. She does not monitor routinely at home but does have BP cuff at home and will start to monitor.  No further concerns at this time.  Denies new or worsening stroke/TIA symptoms.   ROS:   14 system review of systems is positive for no complaints and all other systems negative  PMH:  Past Medical History:  Diagnosis Date  . Allergic rhinitis 06/03/2007  . Anxiety   . Arthritis   . Atrophic vaginitis   . 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 CVA (cerebral vascular accident) (HCC)   . History of colon polyps   . HLD (hyperlipidemia)   . Hypertension   . Low back pain   . Melanoma in situ (HCC)   . Vitamin D deficiency     Social History:  Social History   Socioeconomic History  . Marital status: Widowed    Spouse name: Not on file  . Number of children: Not on file  . Years of education: Not on file  . Highest education level: Not on file  Occupational History  . Not on file  Social Needs  . Financial resource strain: Not on file  . Food insecurity:    Worry: Not on file    Inability: Not on file  . Transportation needs:    Medical: Not on file    Non-medical: Not on file  Tobacco Use  . Smoking status: Never Smoker  . Smokeless tobacco: Never Used  Substance and Sexual Activity  . Alcohol use: No  . Drug use: No  . Sexual activity: Never  Lifestyle  . Physical activity:    Days per week: Not on file    Minutes per session: Not on file  . Stress: Not on file  Relationships  . Social connections:    Talks on phone: Not on file    Gets together: Not on file    Attends religious service: Not on file    Active member of club or organization: Not on file    Attends meetings of clubs or organizations: Not on file    Relationship status: Not on file  . Intimate partner violence:    Fear of current or ex partner: Not on file    Emotionally abused: Not on file    Physically abused: Not on file    Forced sexual activity: Not on file    Other Topics Concern  . Not on file  Social History Narrative   ** Merged History Encounter **        Medications:   Current Outpatient Medications on File Prior to Visit  Medication Sig Dispense Refill  . atorvastatin (LIPITOR) 40 MG tablet TAKE 1 TABLET BY MOUTH DAILY AT 6:00 PM 90 tablet 0  . clopidogrel (PLAVIX) 75 MG tablet Take 1 tablet (75 mg total) by mouth daily. 90 tablet 4  . sertraline (ZOLOFT) 50 MG tablet TAKE 1 TABLET BY MOUTH DAILY 90 tablet 0  . traMADol (ULTRAM) 50 MG tablet TAKE 1 TABLET BY MOUTH TWICE DAILY AS NEEDED 60 tablet 0   No current facility-administered medications on file prior to visit.     Allergies:   Allergies  Allergen Reactions  . Lisinopril Other (See Comments)    Dizziness and felt odd--cough  Physical Exam General: well developed, well nourished pleasant elderly Caucasian lady, seated, in no evident distress Head: head normocephalic and atraumatic.  Neck: supple with no carotid or supraclavicular bruits Cardiovascular: regular rate and rhythm, no murmurs Musculoskeletal: no deformity Skin:  no rash/petichiae Vascular:  Normal pulses all extremities Vitals:   02/01/18 1447  BP: (!) 164/87  Pulse: 90   Neurologic Exam Mental Status: Awake and fully alert. Oriented to place and time. Recent and remote memory intact. Attention span, concentration and fund of knowledge appropriate. Mood and affect appropriate.  Cranial Nerves: Pupils equal, briskly reactive to light. Extraocular movements full without nystagmus. Visual fields full to confrontation. Hearing intact. Facial sensation intact. Face, tongue, palate moves normally and symmetrically.  Motor: Normal bulk and tone. Normal strength in all tested extremity muscles. Sensory.: intact to touch ,pinprick .position and vibratory sensation.  Coordination: Rapid alternating movements normal in all extremities. Finger-to-nose and heel-to-shin performed accurately bilaterally.   Gait  and Station: Arises from chair without difficulty. Stance is slightly broad-based but overall is able to ambulate independently without use of assistive device Reflexes: 1+ and symmetric. Toes downgoing.    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 Vascular risk factors of hypertension hyperlipidemia and interpreted atherosclerosis.  Patient is being seen today for follow-up visit and does continue to have balance difficulties but overall has recovered well from a stroke standpoint.    PLAN: -Continue Plavix 75 mg daily and continue Lipitor for secondary stroke prevention -F/u with PCP regarding your HLD and HTN management -advised patient to start to monitor BP at home with recordings and to schedule follow-up appointment with PCP for possible need of medication management.   -Advised to continue to stay active with exercise classes and maintain healthy diet  -Maintain strict control of hypertension with blood pressure goal below 130/90, diabetes with hemoglobin A1c goal below 6.5% and cholesterol with LDL cholesterol (bad cholesterol) goal below 70 mg/dL. I also advised the patient to eat a healthy diet with plenty of whole grains, cereals, fruits and vegetables, exercise regularly and maintain ideal body weight.  Stable from stroke standpoint and recommended follow-up as needed but advised patient to call office with questions, concerns or need of future follow-up appointment  Greater than 50% of time during this 25 minute visit was spent on counseling,explanation of diagnosis left vertebral artery occlusion and brainstem stroke, planning of further management, discussion with patient and family and coordination of care   George HughJessica VanSchaick, AGNP-BC  [facility] [address] [place], [place] [id number]  Phone [phone] Fax [phone] Note: This document was prepared with digital  dictation and possible smart phrase technology. Any transcriptional errors that result from this process are unintentional.

## 2018-02-01 NOTE — Patient Instructions (Addendum)
Continue clopidogrel 75 mg daily  and lipitor  for secondary stroke prevention  Continue to follow up with PCP regarding cholesterol management   Continue exercise and try to increase protein intake to help curb carb cravings  Continue to monitor blood pressure at home  Maintain strict control of hypertension with blood pressure goal below 130/90, diabetes with hemoglobin A1c goal below 6.5% and cholesterol with LDL cholesterol (bad cholesterol) goal below 70 mg/dL. I also advised the patient to eat a healthy diet with plenty of whole grains, cereals, fruits and vegetables, exercise regularly and maintain ideal body weight.  Followup in the future with me in as needed or call earlier if needed       Thank you for coming to see [place] at [facility]. I hope we have been able to provide you high quality care today.  You may receive a patient satisfaction survey over the next few weeks. We would appreciate your feedback and comments so that we may continue to improve ourselves and the health of our patients.

## 2018-02-08 NOTE — Progress Notes (Signed)
I agree with the above plan 

## 2018-04-03 ENCOUNTER — Other Ambulatory Visit: Payer: Self-pay | Admitting: Family Medicine

## 2018-04-03 NOTE — Telephone Encounter (Signed)
Pt last app was 01/22/17 ok to send in refills or call for webex visit?

## 2018-04-03 NOTE — Telephone Encounter (Signed)
Needs WEBEX. Okay 3 month refill to get her through.

## 2018-05-12 ENCOUNTER — Telehealth: Payer: Self-pay | Admitting: Physician Assistant

## 2018-05-12 NOTE — Telephone Encounter (Signed)
Error

## 2018-07-06 ENCOUNTER — Other Ambulatory Visit: Payer: Self-pay

## 2018-07-06 MED ORDER — ATORVASTATIN CALCIUM 40 MG PO TABS: ORAL_TABLET | ORAL | 0 refills | Status: DC

## 2018-07-06 MED ORDER — SERTRALINE HCL 50 MG PO TABS: 50.0000 mg | ORAL_TABLET | Freq: Every day | ORAL | 0 refills | Status: DC

## 2018-07-06 NOTE — Telephone Encounter (Signed)
Rx request  Patient need to be seen. Last filled 04/03/18 #90/0  Both medications Last OV 01/22/17

## 2018-09-05 IMAGING — CT CT ABDOMEN W/O CM
2 of 4 series · 15 of 46 positions shown, 17 images · non-contrast
Comparison: 11/20/2004

CLINICAL DATA: Dysphagia, assess for fluoroscopic gastrostomy

EXAM:
CT ABDOMEN WITHOUT CONTRAST
TECHNIQUE: Multidetector CT imaging of the abdomen was performed following the
standard protocol without IV contrast.

[Series 4: abd/ pelvis 5.0 i30f 2 · axial · 0.76mm/px · z∈[+947,+1147]mm · 12 of 46 slices shown, 14 images]
[im 3/46  soft-tissue]
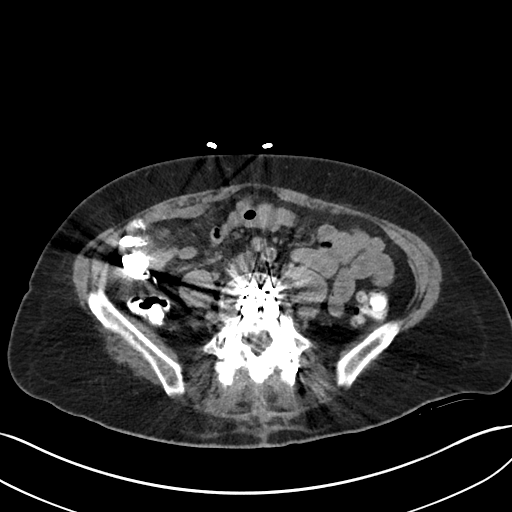
[im 3/46  bone]
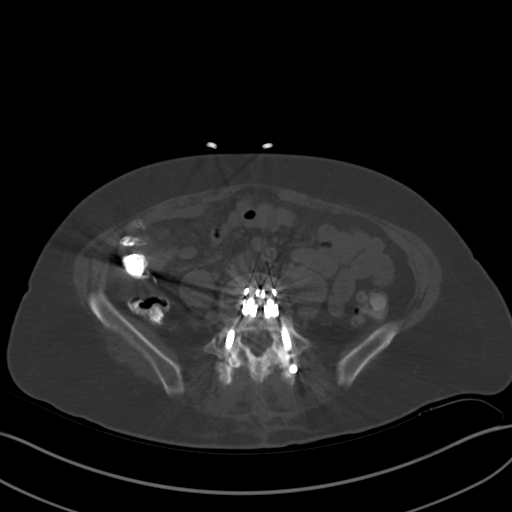
[im 7/46  soft-tissue]
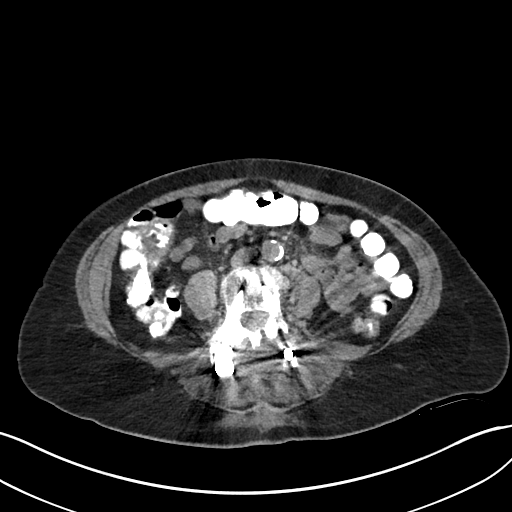
[im 11/46  soft-tissue]
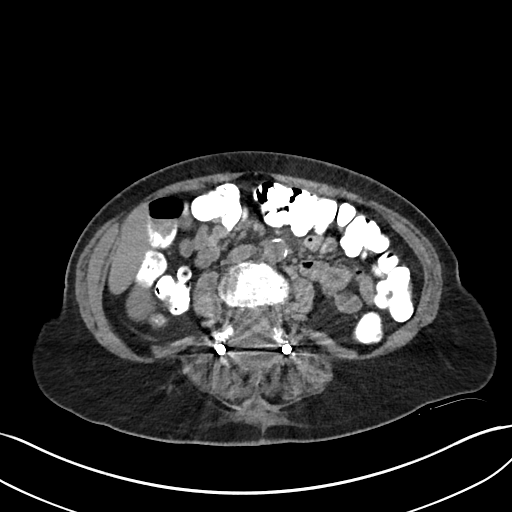
[im 13/46  soft-tissue]
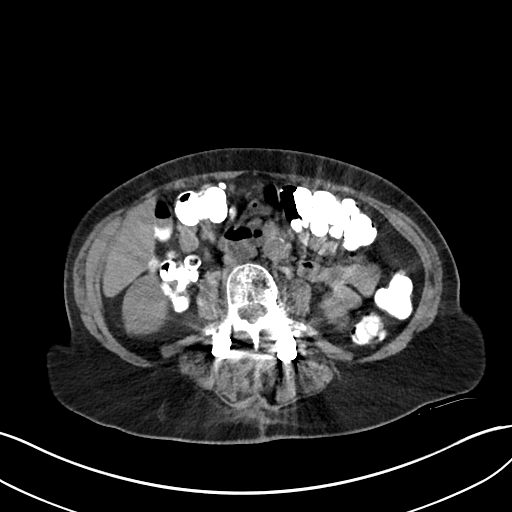
[im 18/46  soft-tissue]
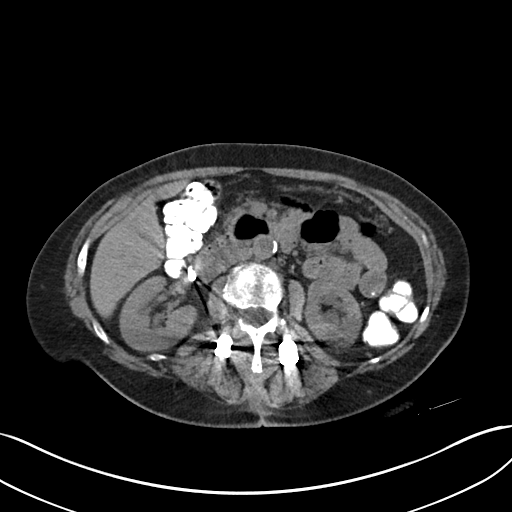
[im 22/46  soft-tissue]
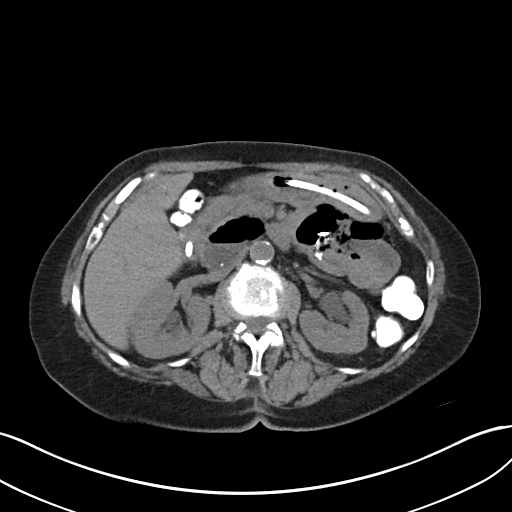
[im 24/46  soft-tissue]
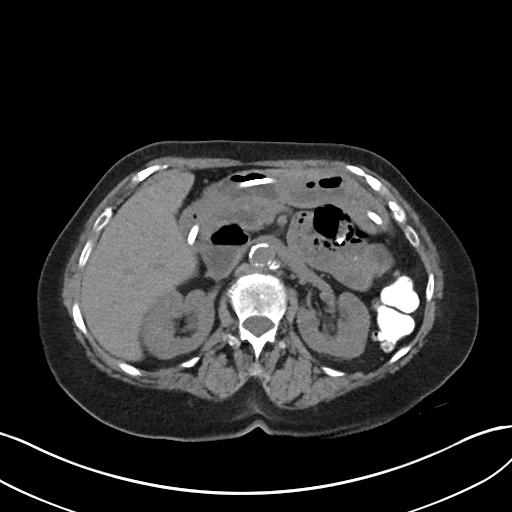
[im 28/46  soft-tissue]
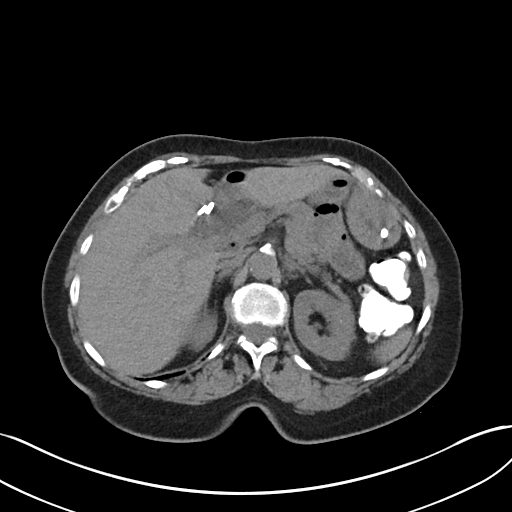
[im 33/46  soft-tissue]
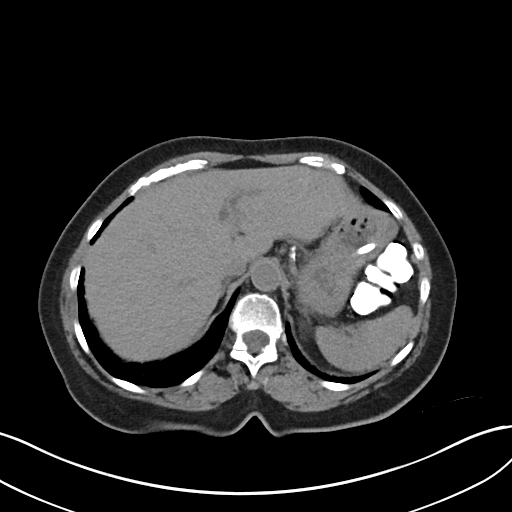
[im 33/46  bone]
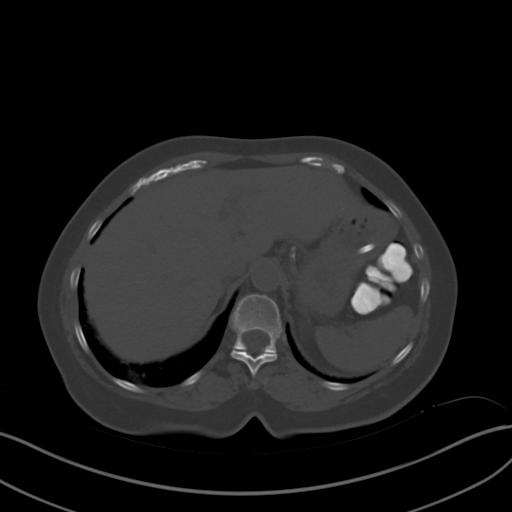
[im 35/46  soft-tissue]
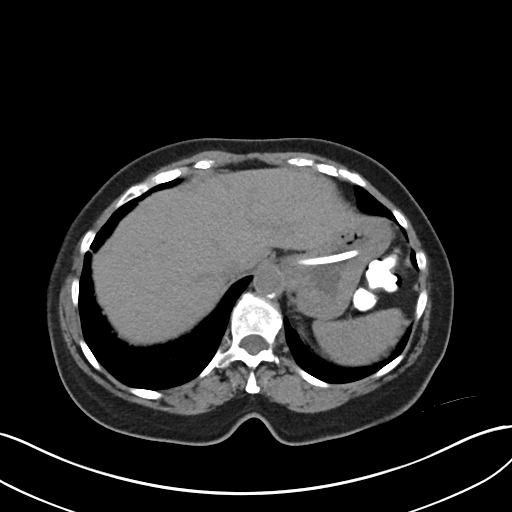
[im 39/46  soft-tissue]
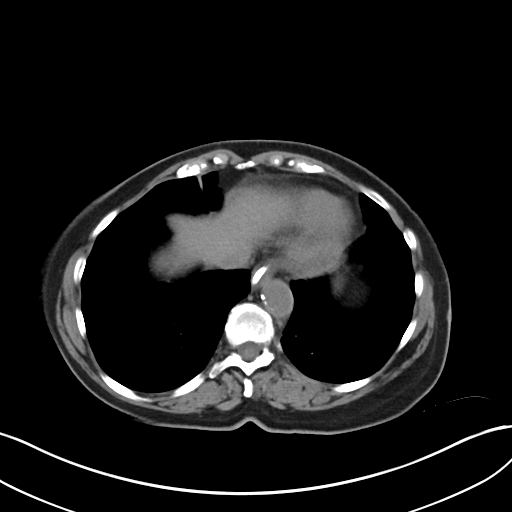
[im 43/46  soft-tissue]
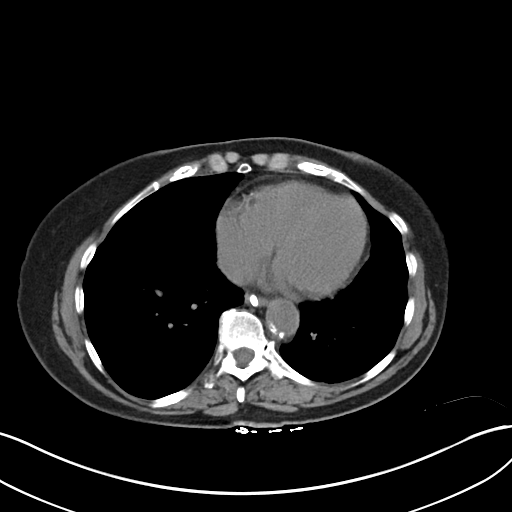

[Series 7: cor st · coronal · 0.50mm/px · 3 of 99 slices shown]
[im 44/99  soft-tissue]
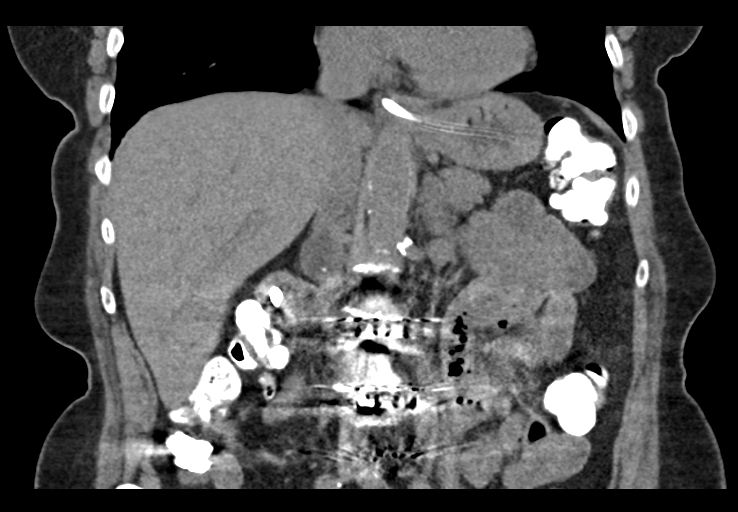
[im 55/99  soft-tissue]
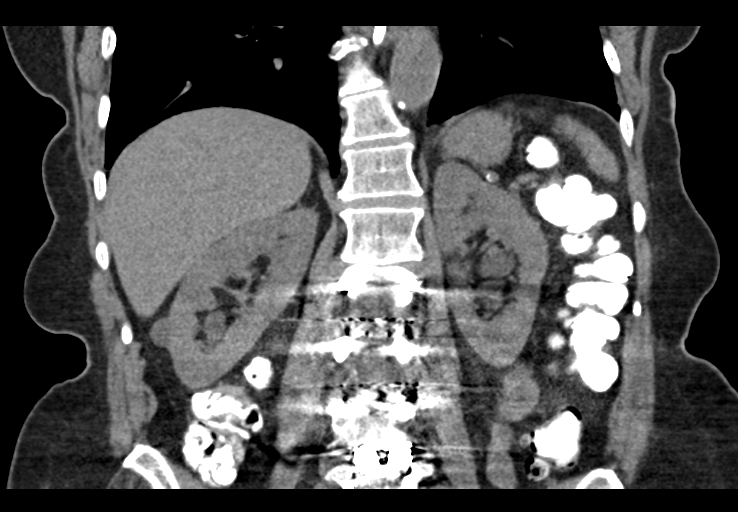
[im 66/99  soft-tissue]
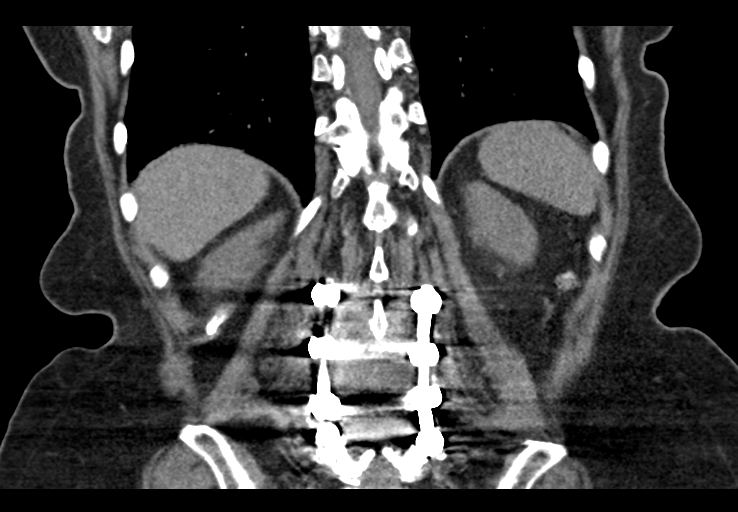

[15 of 46 positions shown; findings below may reference images not displayed]

FINDINGS: Lower chest: Minor bibasilar atelectasis. Normal heart size. No
pericardial or pleural effusion.

Hepatobiliary: Remote cholecystectomy. No definite focal hepatic
abnormality within the limits of noncontrast imaging. Diffuse
biliary dilatation noted, common bile duct measures up to 17 mm.
Suspect postcholecystectomy related.

Pancreas: Unremarkable. No pancreatic ductal dilatation or
surrounding inflammatory changes.

Spleen: Normal in size without focal abnormality.

Adrenals/Urinary Tract: Stable small 8 mm left adrenal nodule,
suspect adenoma. The right adrenal gland appears normal.

No definite renal obstruction or hydronephrosis. Collecting systems
are prominent bilaterally. No hydroureter. Small exophytic right
renal cyst suspected measuring 10 mm, image 31.

Stomach/Bowel: Feeding tube tip is in the mid duodenum. Duodenum
diverticulum noted. Negative for bowel obstruction, significant
dilatation, ileus, or free air. Colonic diverticulosis noted.

Stomach is decompressed. No hiatal hernia. Stomach is within the
anterior abdomen inferior to the xiphoid and left hepatic lobe.
Stomach is also superior to the colon. Stomach anatomy is amenable
to percutaneous fluoroscopic gastrostomy technique.

No fluid collection or abscess.

Vascular/Lymphatic: Abdominal atherosclerosis evident. Negative for
aneurysm. No significant adenopathy within the limits of noncontrast
imaging.

Other: No ascites or abdominal wall hernias.

Musculoskeletal: Extensive artifact from previous posterior spinal
fusion spanning several levels. No definite acute osseous finding.
IMPRESSION: Stomach anatomy is amenable to percutaneous fluoroscopic gastrostomy
technique.

No other acute intra-abdominal finding by noncontrast CT.

Abdominal atherosclerosis without aneurysm

Colonic diverticulosis

Remote cholecystectomy.

Diffuse biliary dilatation, suspect postcholecystectomy related.

## 2018-10-07 ENCOUNTER — Other Ambulatory Visit: Payer: Self-pay | Admitting: Adult Health

## 2018-10-10 ENCOUNTER — Other Ambulatory Visit: Payer: Self-pay

## 2018-10-10 MED ORDER — ATORVASTATIN CALCIUM 40 MG PO TABS: ORAL_TABLET | ORAL | 0 refills | Status: DC

## 2018-10-10 MED ORDER — SERTRALINE HCL 50 MG PO TABS: 50.0000 mg | ORAL_TABLET | Freq: Every day | ORAL | 0 refills | Status: DC

## 2018-10-17 ENCOUNTER — Other Ambulatory Visit: Payer: Self-pay

## 2018-10-17 ENCOUNTER — Ambulatory Visit (INDEPENDENT_AMBULATORY_CARE_PROVIDER_SITE_OTHER): Payer: Medicare Other

## 2018-10-17 VITALS — BP 160/84 | Temp 98.0°F | Ht 60.0 in | Wt 150.1 lb

## 2018-10-17 DIAGNOSIS — Z0000 Encounter for general adult medical examination without abnormal findings: Secondary | ICD-10-CM

## 2018-10-17 NOTE — Progress Notes (Signed)
I have reviewed documentation for AWV and Advance Care planning provided by [facility], I agree with documentation, I was immediately available for any questions. Mylinh Cragg, DO   

## 2018-10-17 NOTE — Progress Notes (Signed)
Subjective:   [name] is a 78 y.o. female who presents for Medicare Annual (Subsequent) preventive examination.  Review of Systems:   Cardiac Risk Factors include: advanced age (>72men, >28 women);dyslipidemia;hypertension     Objective:     Vitals: BP (!) 160/84 (BP Location: Left Arm, Patient Position: Sitting, Cuff Size: Normal)   Temp 98 F (36.7 C) (Temporal)   Ht 5' (1.524 m)   Wt 150 lb 1.6 oz (68.1 kg)   BMI 29.31 kg/m   Body mass index is 29.31 kg/m.  Advanced Directives 10/17/2018 12/30/2016 11/03/2016 11/01/2016 05/25/2016  Does Patient Have a Medical Advance Directive? No No No No No  Would patient like information on creating a medical advance directive? Yes (MAU/Ambulatory/Procedural Areas - Information given) - No - Patient declined No - Patient declined Yes (MAU/Ambulatory/Procedural Areas - Information given)    Tobacco Social History   Tobacco Use  Smoking Status Never Smoker  Smokeless Tobacco Never Used     Counseling given: Not Answered   Clinical Intake:  Pre-visit preparation completed: Yes  Pain : No/denies pain  Diabetes: No  How often do you need to have someone help you when you read instructions, pamphlets, or other written materials from your doctor or pharmacy?: 1 - Never  Interpreter Needed?: No  Information entered by :: Denman George LPN  Past Medical History:  Diagnosis Date  . Allergic rhinitis 06/03/2007  . Anxiety   . Arthritis   . Atrophic vaginitis   . 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 CVA (cerebral vascular accident) ([place])   . History of colon polyps   . HLD (hyperlipidemia)   . Hypertension   . Low back pain   . 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 Vitamin D deficiency    Past Surgical History:  Procedure Laterality Date  . BACK SURGERY    . CHOLECYSTECTOMY  2006  . IR GASTROSTOMY TUBE MOD SED  11/17/2016  . IR GASTROSTOMY TUBE REMOVAL  01/18/2017  . S/P Hysterectomy  1990   Family  History  Problem Relation Age of Onset  . Stroke Mother   . Hyperlipidemia Mother   . Diabetes Mellitus II Mother   . Hypertension Mother   . Heart disease Mother   . Stroke Father   . Hyperlipidemia Father   . Hypertension Father   . Heart disease Father   . Colon cancer Maternal Aunt   . Hypertension Son   . Anxiety disorder Son    Social History   Socioeconomic History  . Marital status: Widowed    Spouse name: Not on file  . Number of children: Not on file  . Years of education: Not on file  . Highest education level: Not on file  Occupational History  . Not on file  Social Needs  . Financial resource strain: Not on file  . Food insecurity    Worry: Not on file    Inability: Not on file  . Transportation needs    Medical: Not on file    Non-medical: Not on file  Tobacco Use  . Smoking status: Never Smoker  . Smokeless tobacco: Never Used  Substance and Sexual Activity  . Alcohol use: No  . Drug use: No  . Sexual activity: Never  Lifestyle  . Physical activity    Days per week: Not on file    Minutes per session: Not on file  . Stress: Not on file  Relationships  . Social Musicianconnections    Talks on phone: Not on file    Gets together: Not on file    Attends religious service: Not on file    Active member of club or organization: Not on file    Attends meetings of clubs or organizations: Not on file    Relationship status: Not on file  Other Topics Concern  . Not on file  Social History Narrative   ** Merged History Encounter **        Outpatient Encounter Medications as of 10/17/2018  Medication Sig  . atorvastatin (LIPITOR) 40 MG tablet TAKE 1 TABLET BY MOUTH DAILY AT 6PM  . sertraline (ZOLOFT) 50 MG tablet Take 1 tablet (50 mg total) by mouth daily. Needs to be seen for more refills.  . clopidogrel (PLAVIX) 75 MG tablet Take 1 tablet (75 mg total) by mouth daily. (Patient not taking: Reported on 10/17/2018)  . traMADol (ULTRAM) 50 MG tablet TAKE 1  TABLET BY MOUTH TWICE DAILY AS NEEDED (Patient not taking: Reported on 10/17/2018)   No facility-administered encounter medications on file as of 10/17/2018.     Activities of Daily Living In your present state of health, do you have any difficulty performing the following activities: 10/17/2018  Hearing? N  Vision? N  Difficulty concentrating or making decisions? N  Walking or climbing stairs? Y  Comment at times due to history of CVA  Dressing or bathing? N  Doing errands, shopping? N  Preparing Food and eating ? N  Using the Toilet? N  In the past six months, have you accidently leaked urine? N  Do you have problems with loss of bowel control? N  Managing your Medications? N  Managing your Finances? N  Housekeeping or managing your Housekeeping? N  Some recent data might be hidden    Patient Care Team: Helane RimaWallace, Erica, DO as PCP - General (Family Medicine)    Assessment:   This is a routine wellness examination for [place].  Exercise Activities and Dietary recommendations Current Exercise Habits: The patient does not participate in regular exercise at present  Goals    . Increase physical activity     Would like to return to exercise class at [employer]        Fall Risk Fall Risk  10/17/2018 07/28/2017 01/28/2017 01/22/2017 12/15/2016  Falls in the past year? 1 Yes No No No  Number falls in past yr: 0 1 - - -  Injury with Fall? 0 Yes - - -  Risk for fall due to : Impaired balance/gait - - - -  Follow up Education provided;Falls prevention discussed;Falls evaluation completed - - - -   Is the patient's home free of loose throw rugs in walkways, pet beds, electrical cords, etc?   yes      Grab bars in the bathroom? yes      Handrails on the stairs?   yes      Adequate lighting?   yes  Timed Get Up and Go performed: completed and within normal timeframe; no gait abnormalities noted    Depression Screen PHQ 2/9 Scores 10/17/2018 01/22/2017  PHQ - 2 Score 0 0  Exception  Documentation - Medical reason     Cognitive Function     6CIT Screen 10/17/2018  What Year? 0 points  What month? 0 points  What time? 0 points  Count back from 20 0 points  Months in reverse 0 points    Immunization History  Administered Date(s) Administered  . Fluad Quad(high Dose 65+) 09/26/2018  . Influenza Whole 09/09/2009, 10/13/2017    Qualifies for Shingles Vaccine?Discussed and patient will check with pharmacy for coverage.  Patient education handout provided    Screening Tests Health Maintenance  Topic Date Due  . TETANUS/TDAP  09/15/1961  . PNA vac Low Risk Adult (1 of 2 - PCV13) 09/16/2007  . INFLUENZA VACCINE  Completed  . DEXA SCAN  Completed    Cancer Screenings: Lung: Low Dose CT Chest recommended if Age 53-80 years, 30 pack-year currently smoking OR have quit w/in 15years. Patient does not qualify. Breast:  Up to date on Mammogram? No; recommended, patient states that she will schedule    Up to date of Bone Density/Dexa? No; recommended  Colorectal: not indicated    Plan:  I have personally reviewed and addressed the Medicare Annual Wellness questionnaire and have noted the following in the patient's chart:  A. Medical and social history B. Use of alcohol, tobacco or illicit drugs  C. Current medications and supplements D. Functional ability and status E.  Nutritional status F.  Physical activity G. Advance directives H. List of other physicians I.  Hospitalizations, surgeries, and ER visits in previous 12 months J.  Vitals K. Screenings such as hearing and vision if needed, cognitive and depression L. Referrals, records requested, and appointments- will request immunization records from pharmacy   In addition, I have reviewed and discussed with patient certain preventive protocols, quality metrics, and best practice recommendations. A written personalized care plan for preventive services as well as general preventive health recommendations were  provided to patient.   Signed,  Kandis Fantasia, LPN  Nurse Health Advisor   Nurse Notes: Patient states that she was released by neurology.  Has not had Plavix refilled and would like to know if she should continue and if a refill can be sent from this office.  Please advise.

## 2018-10-17 NOTE — Patient Instructions (Addendum)
[name] , Thank you for taking time to come for your Medicare Wellness Visit. I appreciate your ongoing commitment to your health goals. Please review the following plan we discussed and let me know if I can assist you in the future.   Screening recommendations/referrals: Colorectal Screening: not indicated Mammogram: last 12/12/14; recommended  Bone Density: reccommended   Vision and Dental Exams: Recommended annual ophthalmology exams for early detection of glaucoma and other disorders of the eye Recommended annual dental exams for proper oral hygiene  Vaccinations: Influenza vaccine: completed 09/26/18 Pneumococcal vaccine: we will try to get records of your last from the pharmacy  Tdap vaccine: reccommended; Please call your insurance company to determine your out of pocket expense. You may also receive this vaccine at your local pharmacy or Health Dept. Shingles vaccine: Please call your insurance company to determine your out of pocket expense for the Shingrix vaccine. You may receive this vaccine at your local pharmacy.  Advanced directives: Advance directives discussed with you today. I have provided a copy for you to complete at home and have notarized. Once this is complete please bring a copy in to our office so we can scan it into your chart.  Goals: Recommend to drink at least 6-8 8oz glasses of water per day and incorporate more fresh fruits and vegetables into diet.   Next appointment: Please schedule your Annual Wellness Visit with your Nurse Health Advisor in one year. Please schedule appointment with alternate provider before the end of the year.    Preventive Care 41 Years and Older, Female Preventive care refers to lifestyle choices and visits with your health care provider that can promote health and wellness. What does preventive care include?  A yearly physical exam. This is also called an annual well check.  Dental exams once or twice a year.  Routine eye  exams. Ask your health care provider how often you should have your eyes checked.  Personal lifestyle choices, including:  Daily care of your teeth and gums.  Regular physical activity.  Eating a healthy diet.  Avoiding tobacco and drug use.  Limiting alcohol use.  Practicing safe sex.  Taking low-dose aspirin every day if recommended by your health care provider.  Taking vitamin and mineral supplements as recommended by your health care provider. What happens during an annual well check? The services and screenings done by your health care provider during your annual well check will depend on your age, overall health, lifestyle risk factors, and family history of disease. Counseling  Your health care provider may ask you questions about your:  Alcohol use.  Tobacco use.  Drug use.  Emotional well-being.  Home and relationship well-being.  Sexual activity.  Eating habits.  History of falls.  Memory and ability to understand (cognition).  Work and work Statistician.  Reproductive health. Screening  You may have the following tests or measurements:  Height, weight, and BMI.  Blood pressure.  Lipid and cholesterol levels. These may be checked every 5 years, or more frequently if you are over 76 years old.  Skin check.  Lung cancer screening. You may have this screening every year starting at age 76 if you have a 30-pack-year history of smoking and currently smoke or have quit within the past 15 years.  Fecal occult blood test (FOBT) of the stool. You may have this test every year starting at age 76.  Flexible sigmoidoscopy or colonoscopy. You may have a sigmoidoscopy every 5 years or a colonoscopy every  10 years starting at age 76.  Hepatitis C blood test.  Hepatitis B blood test.  Sexually transmitted disease (STD) testing.  Diabetes screening. This is done by checking your blood sugar (glucose) after you have not eaten for a while (fasting). You may  have this done every 1-3 years.  Bone density scan. This is done to screen for osteoporosis. You may have this done starting at age 76.  Mammogram. This may be done every 1-2 years. Talk to your health care provider about how often you should have regular mammograms. Talk with your health care provider about your test results, treatment options, and if necessary, the need for more tests. Vaccines  Your health care provider may recommend certain vaccines, such as:  Influenza vaccine. This is recommended every year.  Tetanus, diphtheria, and acellular pertussis (Tdap, Td) vaccine. You may need a Td booster every 10 years.  Zoster vaccine. You may need this after age 76.  Pneumococcal 13-valent conjugate (PCV13) vaccine. One dose is recommended after age 76.  Pneumococcal polysaccharide (PPSV23) vaccine. One dose is recommended after age 76. Talk to your health care provider about which screenings and vaccines you need and how often you need them. This information is not intended to replace advice given to you by your health care provider. Make sure you discuss any questions you have with your health care provider. Document Released: 01/25/2015 Document Revised: 09/18/2015 Document Reviewed: 10/30/2014 Elsevier Interactive Patient Education  2017 [employer].  Fall Prevention in the Home Falls can cause injuries. They can happen to people of all ages. There are many things you can do to make your home safe and to help prevent falls. What can I do on the outside of my home?  Regularly fix the edges of walkways and driveways and fix any cracks.  Remove anything that might make you trip as you walk through a door, such as a raised step or threshold.  Trim any bushes or trees on the path to your home.  Use bright outdoor lighting.  Clear any walking paths of anything that might make someone trip, such as rocks or tools.  Regularly check to see if handrails are loose or broken. Make  sure that both sides of any steps have handrails.  Any raised decks and porches should have guardrails on the edges.  Have any leaves, snow, or ice cleared regularly.  Use sand or salt on walking paths during winter.  Clean up any spills in your garage right away. This includes oil or grease spills. What can I do in the bathroom?  Use night lights.  Install grab bars by the toilet and in the tub and shower. Do not use towel bars as grab bars.  Use non-skid mats or decals in the tub or shower.  If you need to sit down in the shower, use a plastic, non-slip stool.  Keep the floor dry. Clean up any water that spills on the floor as soon as it happens.  Remove soap buildup in the tub or shower regularly.  Attach bath mats securely with double-sided non-slip rug tape.  Do not have throw rugs and other things on the floor that can make you trip. What can I do in the bedroom?  Use night lights.  Make sure that you have a light by your bed that is easy to reach.  Do not use any sheets or blankets that are too big for your bed. They should not hang down onto the floor.  Have a firm chair that has side arms. You can use this for support while you get dressed.  Do not have throw rugs and other things on the floor that can make you trip. What can I do in the kitchen?  Clean up any spills right away.  Avoid walking on wet floors.  Keep items that you use a lot in easy-to-reach places.  If you need to reach something above you, use a strong step stool that has a grab bar.  Keep electrical cords out of the way.  Do not use floor polish or wax that makes floors slippery. If you must use wax, use non-skid floor wax.  Do not have throw rugs and other things on the floor that can make you trip. What can I do with my stairs?  Do not leave any items on the stairs.  Make sure that there are handrails on both sides of the stairs and use them. Fix handrails that are broken or loose.  Make sure that handrails are as long as the stairways.  Check any carpeting to make sure that it is firmly attached to the stairs. Fix any carpet that is loose or worn.  Avoid having throw rugs at the top or bottom of the stairs. If you do have throw rugs, attach them to the floor with carpet tape.  Make sure that you have a light switch at the top of the stairs and the bottom of the stairs. If you do not have them, ask someone to add them for you. What else can I do to help prevent falls?  Wear shoes that:  Do not have high heels.  Have rubber bottoms.  Are comfortable and fit you well.  Are closed at the toe. Do not wear sandals.  If you use a stepladder:  Make sure that it is fully opened. Do not climb a closed stepladder.  Make sure that both sides of the stepladder are locked into place.  Ask someone to hold it for you, if possible.  Clearly mark and make sure that you can see:  Any grab bars or handrails.  First and last steps.  Where the edge of each step is.  Use tools that help you move around (mobility aids) if they are needed. These include:  Canes.  Walkers.  Scooters.  Crutches.  Turn on the lights when you go into a dark area. Replace any light bulbs as soon as they burn out.  Set up your furniture so you have a clear path. Avoid moving your furniture around.  If any of your floors are uneven, fix them.  If there are any pets around you, be aware of where they are.  Review your medicines with your doctor. Some medicines can make you feel dizzy. This can increase your chance of falling. Ask your doctor what other things that you can do to help prevent falls. This information is not intended to replace advice given to you by your health care provider. Make sure you discuss any questions you have with your health care provider. Document Released: 10/25/2008 Document Revised: 06/06/2015 Document Reviewed: 02/02/2014 Elsevier Interactive Patient  Education  2017 [employer]

## 2018-10-26 ENCOUNTER — Telehealth: Payer: Self-pay

## 2018-10-26 NOTE — Telephone Encounter (Signed)
Fax also received for refill of atorvastatin last refill had note that patient needed to be seen for more refills.

## 2018-10-26 NOTE — Telephone Encounter (Signed)
Received refill request for Plavix we have never given before. Ok to fill

## 2018-10-27 NOTE — Telephone Encounter (Signed)
Patient has appointment on Monday.

## 2018-10-27 NOTE — Telephone Encounter (Signed)
Decline. Needs visit.

## 2018-10-30 NOTE — Telephone Encounter (Signed)
ERROR

## 2018-10-31 ENCOUNTER — Telehealth: Payer: Self-pay | Admitting: Physician Assistant

## 2018-10-31 ENCOUNTER — Encounter: Payer: Self-pay | Admitting: Physician Assistant

## 2018-10-31 ENCOUNTER — Other Ambulatory Visit: Payer: Self-pay

## 2018-10-31 ENCOUNTER — Ambulatory Visit (INDEPENDENT_AMBULATORY_CARE_PROVIDER_SITE_OTHER): Payer: Medicare Other | Admitting: Physician Assistant

## 2018-10-31 VITALS — BP 140/86 | HR 97 | Temp 98.5°F | Ht 60.0 in | Wt 145.2 lb

## 2018-10-31 DIAGNOSIS — E782 Mixed hyperlipidemia: Secondary | ICD-10-CM

## 2018-10-31 DIAGNOSIS — I6330 Cerebral infarction due to thrombosis of unspecified cerebral artery: Secondary | ICD-10-CM | POA: Diagnosis not present

## 2018-10-31 DIAGNOSIS — R7989 Other specified abnormal findings of blood chemistry: Secondary | ICD-10-CM | POA: Diagnosis not present

## 2018-10-31 DIAGNOSIS — F419 Anxiety disorder, unspecified: Secondary | ICD-10-CM | POA: Diagnosis not present

## 2018-10-31 LAB — COMPREHENSIVE METABOLIC PANEL
ALT: 26 U/L (ref 0–35)
AST: 25 U/L (ref 0–37)
Albumin: 4.8 g/dL (ref 3.5–5.2)
Alkaline Phosphatase: 145 U/L — ABNORMAL HIGH (ref 39–117)
BUN: 25 mg/dL — ABNORMAL HIGH (ref 6–23)
CO2: 28 mEq/L (ref 19–32)
Calcium: 9.7 mg/dL (ref 8.4–10.5)
Chloride: 103 mEq/L (ref 96–112)
Creatinine, Ser: 0.69 mg/dL (ref 0.40–1.20)
GFR: 82.69 mL/min (ref >60.00)
Glucose, Bld: 100 mg/dL — ABNORMAL HIGH (ref 70–99)
Potassium: 4.4 mEq/L (ref 3.5–5.1)
Sodium: 140 mEq/L (ref 135–145)
Total Bilirubin: 0.7 mg/dL (ref 0.2–1.2)
Total Protein: 7.3 g/dL (ref 6.0–8.3)

## 2018-10-31 LAB — CBC WITH DIFFERENTIAL/PLATELET
Basophils Absolute: 0 10*3/uL (ref 0.0–0.1)
Basophils Relative: 0.4 % (ref 0.0–3.0)
Eosinophils Absolute: 0.1 10*3/uL (ref 0.0–0.7)
Eosinophils Relative: 0.9 % (ref 0.0–5.0)
HCT: 44.5 % (ref 36.0–46.0)
Hemoglobin: 14.8 g/dL (ref 12.0–15.0)
Lymphocytes Relative: 19.1 % (ref 12.0–46.0)
Lymphs Abs: 1.3 10*3/uL (ref 0.7–4.0)
MCHC: 33.4 g/dL (ref 30.0–36.0)
MCV: 92.8 fl (ref 78.0–100.0)
Monocytes Absolute: 0.5 10*3/uL (ref 0.1–1.0)
Monocytes Relative: 6.7 % (ref 3.0–12.0)
Neutro Abs: 5.1 10*3/uL (ref 1.4–7.7)
Neutrophils Relative %: 72.9 % (ref 43.0–77.0)
Platelets: 265 10*3/uL (ref 150.0–400.0)
RBC: 4.79 Mil/uL (ref 3.87–5.11)
RDW: 13.6 % (ref 11.5–15.5)
WBC: 7 10*3/uL (ref 4.0–10.5)

## 2018-10-31 LAB — LIPID PANEL
Cholesterol: 137 mg/dL (ref 0–200)
HDL: 43 mg/dL (ref >39.00)
LDL Cholesterol: 73 mg/dL (ref 0–99)
NonHDL: 94.36
Total CHOL/HDL Ratio: 3
Triglycerides: 106 mg/dL (ref 0.0–149.0)
VLDL: 21.2 mg/dL (ref 0.0–40.0)

## 2018-10-31 LAB — T4, FREE: Free T4: 0.62 ng/dL (ref 0.60–1.60)

## 2018-10-31 LAB — TSH: TSH: 3.61 u[IU]/mL (ref 0.35–4.50)

## 2018-10-31 MED ORDER — CLOPIDOGREL BISULFATE 75 MG PO TABS: 75.0000 mg | ORAL_TABLET | Freq: Every day | ORAL | 3 refills | Status: DC

## 2018-10-31 MED ORDER — SERTRALINE HCL 50 MG PO TABS: 50.0000 mg | ORAL_TABLET | Freq: Every day | ORAL | 1 refills | Status: DC

## 2018-10-31 NOTE — Patient Instructions (Signed)
It was great to see you!  We will continue Plavix 75 mg indefinitely, per neurology recommendation.  I have refilled the zoloft and plavix. I am going to wait until your cholesterol panel has returned so we can make sure we do not need to adjust your lipitor dose before I refill it.  Let's follow-up in 6 months, sooner if you have concerns.  Take care,  Inda Coke PA-C

## 2018-10-31 NOTE — Progress Notes (Signed)
[name] is a 78 y.o. female is here for Transfer of Care.  I acted as a Neurosurgeonscribe for Energy East CorporationSamantha Son Barkan, PA-C Corky Mullonna Orphanos, LPN  History of Present Illness:   Chief Complaint  Patient presents with  . Transfer of Care    from Dr. Earlene PlaterWallace    HPI   Pt is here today to transfer care. Needs refills on medications.  Cerebral thrombosis with cerebral infarction -- last saw neurology earlier this year, and was essentially discharged on Plavix 75 mg daily and cholesterol medication.  She is overall recovering from her stroke well.  She has some intermittent right sided weakness, but overall denies any falls or other significant neurological deficits.  Abnormal TSH -- patient reports that she had some elevated TSH at 1 point, and this was just being monitored as it was subclinical hypothyroidism.  She is due for recheck at this time.  Denies any concerning symptoms including changes in fatigue, changes in temperature tolerance, abnormal weight changes.  Hyperlipidemia --currently on Lipitor 40 mg daily.  She is tolerating this well.  She has not had a lipid panel in over 1 year.  Anxiety -- currently well controlled on 50 mg Zoloft daily.  She has been on Zoloft for quite some time, and it does help with OCD tendencies.  She feels stable on this dosage.  Denies any suicidal or homicidal thoughts.  There are no preventive care reminders to display for this patient.  Past Medical History:  Diagnosis Date  . Allergic rhinitis 06/03/2007  . Anxiety   . Arthritis   . 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 HLD (hyperlipidemia)   . Melanoma in situ (HCC)   . Vitamin D deficiency      Social History   Socioeconomic History  . Marital status: Widowed    Spouse name: Not on file  . Number of children: Not on file  . Years of education: Not on file  . Highest education level: Not on file  Occupational History  . Not on file  Social Needs  . Financial resource  strain: Not on file  . Food insecurity    Worry: Not on file    Inability: Not on file  . Transportation needs    Medical: Not on file    Non-medical: Not on file  Tobacco Use  . Smoking status: Never Smoker  . Smokeless tobacco: Never Used  Substance and Sexual Activity  . Alcohol use: No  . Drug use: No  . Sexual activity: Never  Lifestyle  . Physical activity    Days per week: Not on file    Minutes per session: Not on file  . Stress: Not on file  Relationships  . Social Musicianconnections    Talks on phone: Not on file    Gets together: Not on file    Attends religious service: Not on file    Active member of club or organization: Not on file    Attends meetings of clubs or organizations: Not on file    Relationship status: Not on file  . Intimate partner violence    Fear of current or ex partner: Not on file    Emotionally abused: Not on file    Physically abused: Not on file    Forced sexual activity: Not on file  Other Topics Concern  . Not on file  Social History Narrative   ** Merged History Encounter **  Past Surgical History:  Procedure Laterality Date  . BACK SURGERY    . CHOLECYSTECTOMY  2006  . IR GASTROSTOMY TUBE MOD SED  11/17/2016  . IR GASTROSTOMY TUBE REMOVAL  01/18/2017  . S/P Hysterectomy  1990    Family History  Problem Relation Age of Onset  . Stroke Mother   . Hyperlipidemia Mother   . Diabetes Mellitus II Mother   . Hypertension Mother   . Heart disease Mother   . Stroke Father   . Hyperlipidemia Father   . Hypertension Father   . Heart disease Father   . Colon cancer Maternal Aunt   . Hypertension Son   . Anxiety disorder Son     PMHx, SurgHx, SocialHx, FamHx, Medications, and Allergies were reviewed in the Visit Navigator and updated as appropriate.   Patient Active Problem List   Diagnosis Date Noted  . Anxiety   . Anemia 01/22/2017  . Benign essential HTN   . Dysarthria, post-stroke   . Acute blood loss anemia   .  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 HLD (hyperlipidemia) 06/03/2007  . Allergic rhinitis 06/03/2007  . VAGINITIS, ATROPHIC 06/03/2007    Social History   Tobacco Use  . Smoking status: Never Smoker  . Smokeless tobacco: Never Used  Substance Use Topics  . Alcohol use: No  . Drug use: No    Current Medications and Allergies:    Current Outpatient Medications:  .  atorvastatin (LIPITOR) 40 MG tablet, TAKE 1 TABLET BY MOUTH DAILY AT 6PM, Disp: 30 tablet, Rfl: 0 .  clopidogrel (PLAVIX) 75 MG tablet, Take 1 tablet (75 mg total) by mouth daily., Disp: 90 tablet, Rfl: 3 .  sertraline (ZOLOFT) 50 MG tablet, Take 1 tablet (50 mg total) by mouth daily., Disp: 90 tablet, Rfl: 1   Allergies  Allergen Reactions  . Lisinopril Other (See Comments)    Dizziness and felt odd--cough     Review of Systems   ROS Negative unless otherwise specified per HPI.  Vitals:   Vitals:   10/31/18 1324  BP: 140/86  Pulse: 97  Temp: 98.5 F (36.9 C)  TempSrc: Temporal  SpO2: 95%  Weight: 145 lb 4 oz (65.9 kg)  Height: 5' (1.524 m)     Body mass index is 28.37 kg/m.   Physical Exam:    Physical Exam Vitals signs and nursing note reviewed.  Constitutional:      General: She is not in acute distress.    Appearance: She is well-developed. She is not ill-appearing or toxic-appearing.  Cardiovascular:     Rate and Rhythm: Normal rate and regular rhythm.     Pulses: Normal pulses.     Heart sounds: Normal heart sounds, S1 normal and S2 normal.     Comments: No LE edema Pulmonary:     Effort: Pulmonary effort is normal.     Breath sounds: Normal breath sounds.  Skin:    General: Skin is warm and dry.  Neurological:     Mental Status: She is alert.     GCS: GCS eye subscore is 4. GCS verbal subscore is 5. GCS motor subscore is 6.  Psychiatric:        Speech: Speech normal.        Behavior: Behavior normal. Behavior  is cooperative.        Assessment and Plan:    Konnie was seen today for transfer of care.  Diagnoses and all orders for  this visit:  Mixed hyperlipidemia We will update lipid panel, prior to refilling her Lipitor.  Her LDL goal is below 70 mg/dL.   -     Lipid panel  Elevated TSH Asymptomatic.  Will update labs. -     TSH -     T4, free  Cerebral thrombosis with cerebral infarction Will continue Plavix 75 mg daily per neurology's recommendation.  Neurology is available on a as needed basis. -     CBC with Differential/Platelet -     Comprehensive metabolic panel  Anxiety Well controlled with Zoloft 50 mg daily.  Other orders -     sertraline (ZOLOFT) 50 MG tablet; Take 1 tablet (50 mg total) by mouth daily. -     clopidogrel (PLAVIX) 75 MG tablet; Take 1 tablet (75 mg total) by mouth daily.    . Reviewed expectations re: course of current medical issues. . Discussed self-management of symptoms. . Outlined signs and symptoms indicating need for more acute intervention. . Patient verbalized understanding and all questions were answered. . See orders for this visit as documented in the electronic medical record. . Patient received an After Visit Summary.  CMA or LPN served as scribe during this visit. History, Physical, and Plan performed by medical provider. The above documentation has been reviewed and is accurate and complete.   Inda Coke, PA-C [place] [place] [facility] 10/31/2018  Follow-up: No follow-ups on file.

## 2018-10-31 NOTE — Telephone Encounter (Signed)
See note  Copied from [place] [id number]. Topic: General - Other >> Oct 31, 2018  3:07 PM Rayann Heman wrote: Reason for CRM: pt called and stated that she had pneumonia shot at the pharmacy on 05/18/13 and had the old shingles shot the same day.

## 2018-11-01 ENCOUNTER — Other Ambulatory Visit: Payer: Self-pay | Admitting: Physician Assistant

## 2018-11-01 DIAGNOSIS — R748 Abnormal levels of other serum enzymes: Secondary | ICD-10-CM

## 2018-11-01 MED ORDER — ATORVASTATIN CALCIUM 40 MG PO TABS: ORAL_TABLET | ORAL | 1 refills | Status: DC

## 2018-11-01 NOTE — Telephone Encounter (Signed)
Left message on voicemail to call office.  

## 2018-11-02 ENCOUNTER — Telehealth: Payer: Self-pay | Admitting: Physician Assistant

## 2018-11-02 NOTE — Telephone Encounter (Signed)
See other message

## 2018-11-02 NOTE — Telephone Encounter (Signed)
See note  Copied from [place] [id number]. Topic: General - Call Back - No Documentation >> Nov 02, 2018  9:30 AM Sheran Luz wrote: Patient returning call to [facility].

## 2018-11-02 NOTE — Telephone Encounter (Signed)
Spoke to pt she said she had the Prevnar 13. Told pt okay I will update your chart. Told her you will need the Pneumovax 23 and if interested in the Shingrix need to check with your insurance and can be done at the pharmacy if covered. Pt verbalized understanding.

## 2018-11-11 ENCOUNTER — Other Ambulatory Visit: Payer: Medicare Other

## 2018-11-15 ENCOUNTER — Other Ambulatory Visit: Payer: Self-pay

## 2018-11-15 ENCOUNTER — Other Ambulatory Visit (INDEPENDENT_AMBULATORY_CARE_PROVIDER_SITE_OTHER): Payer: Medicare Other

## 2018-11-15 DIAGNOSIS — R748 Abnormal levels of other serum enzymes: Secondary | ICD-10-CM | POA: Diagnosis not present

## 2018-11-15 LAB — GAMMA GT: GGT: 11 U/L (ref 7–51)

## 2018-11-15 LAB — HEPATIC FUNCTION PANEL
ALT: 21 U/L (ref 0–35)
AST: 19 U/L (ref 0–37)
Albumin: 4.6 g/dL (ref 3.5–5.2)
Alkaline Phosphatase: 136 U/L — ABNORMAL HIGH (ref 39–117)
Bilirubin, Direct: 0.2 mg/dL (ref 0.0–0.3)
Total Bilirubin: 0.7 mg/dL (ref 0.2–1.2)
Total Protein: 7 g/dL (ref 6.0–8.3)

## 2019-04-28 ENCOUNTER — Encounter: Payer: Self-pay | Admitting: Physician Assistant

## 2019-04-28 ENCOUNTER — Other Ambulatory Visit: Payer: Self-pay

## 2019-04-28 ENCOUNTER — Ambulatory Visit (INDEPENDENT_AMBULATORY_CARE_PROVIDER_SITE_OTHER): Payer: Medicare PPO | Admitting: Physician Assistant

## 2019-04-28 VITALS — BP 150/90 | HR 91 | Temp 98.0°F | Ht 60.0 in | Wt 144.5 lb

## 2019-04-28 DIAGNOSIS — R748 Abnormal levels of other serum enzymes: Secondary | ICD-10-CM

## 2019-04-28 DIAGNOSIS — I10 Essential (primary) hypertension: Secondary | ICD-10-CM

## 2019-04-28 DIAGNOSIS — E782 Mixed hyperlipidemia: Secondary | ICD-10-CM | POA: Diagnosis not present

## 2019-04-28 DIAGNOSIS — M85859 Other specified disorders of bone density and structure, unspecified thigh: Secondary | ICD-10-CM | POA: Diagnosis not present

## 2019-04-28 LAB — COMPREHENSIVE METABOLIC PANEL
ALT: 23 U/L (ref 0–35)
AST: 19 U/L (ref 0–37)
Albumin: 4.4 g/dL (ref 3.5–5.2)
Alkaline Phosphatase: 155 U/L — ABNORMAL HIGH (ref 39–117)
BUN: 19 mg/dL (ref 6–23)
CO2: 29 mEq/L (ref 19–32)
Calcium: 9.4 mg/dL (ref 8.4–10.5)
Chloride: 101 mEq/L (ref 96–112)
Creatinine, Ser: 0.66 mg/dL (ref 0.40–1.20)
GFR: 86.93 mL/min (ref >60.00)
Glucose, Bld: 91 mg/dL (ref 70–99)
Potassium: 4.6 mEq/L (ref 3.5–5.1)
Sodium: 138 mEq/L (ref 135–145)
Total Bilirubin: 0.7 mg/dL (ref 0.2–1.2)
Total Protein: 7 g/dL (ref 6.0–8.3)

## 2019-04-28 LAB — LIPID PANEL
Cholesterol: 131 mg/dL (ref 0–200)
HDL: 52.7 mg/dL (ref >39.00)
LDL Cholesterol: 65 mg/dL (ref 0–99)
NonHDL: 78.65
Total CHOL/HDL Ratio: 2
Triglycerides: 67 mg/dL (ref 0.0–149.0)
VLDL: 13.4 mg/dL (ref 0.0–40.0)

## 2019-04-28 NOTE — Patient Instructions (Signed)
It was great to see you!  Please schedule your DEXA scan on the way out.  Record your blood pressure for me a few times or the next two weeks and follow-up with me in office in 2-4 weeks so we can review. It is important we get your blood pressure under better control due to history of stroke.  Take care,  Jarold Motto PA-C

## 2019-04-28 NOTE — Progress Notes (Signed)
[name] is a 79 y.o. female is here to discuss:  I acted as a Education administrator for [employer], PA-C Anselmo Pickler, LPN  History of Present Illness:   Chief Complaint  Patient presents with  . Hyperlipidemia  . Anxiety    HPI   Hyperlipidemia Pt following up, currently on Lipitor 40 mg daily.   Elevated alk phos Labs checked last 10/31/2018 and were stable except elevated Alkaline phosphate. GGT was normal. DEXA was in 2017, she is over due for this.  Anxiety Pt following up,currently well controlled on 50 mg Zoloft daily.  She has been on Zoloft for quite some time, and it does help with OCD tendencies.  She feels stable on this dosage.  Denies any suicidal or homicidal thoughts.  Elevated blood pressure reading Currently not on any medication, states that she was at one point. She has the ability to check her blood pressures at home but she hasn't recently. Patient denies chest pain, SOB, blurred vision, dizziness, unusual headaches, lower leg swelling. Denies excessive caffeine intake, stimulant usage, excessive alcohol intake, or increase in salt consumption.  BP Readings from Last 3 Encounters:  04/28/19 (!) 150/90  10/31/18 140/86  10/17/18 (!) 160/84     There are no preventive care reminders to display for this patient.  Past Medical History:  Diagnosis Date  . Allergic rhinitis 06/03/2007  . Anxiety   . Arthritis   . 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 HLD (hyperlipidemia)   . Melanoma in situ ([place])   . Vitamin D deficiency      Social History   Socioeconomic History  . Marital status: Widowed    Spouse name: Not on file  . Number of children: Not on file  . Years of education: Not on file  . Highest education level: Not on file  Occupational History  . Not on file  Tobacco Use  . Smoking status: Never Smoker  . Smokeless tobacco: Never Used  Substance and Sexual Activity  . Alcohol use: No  . Drug use: No  . Sexual  activity: Never  Other Topics Concern  . Not on file  Social History Narrative   ** Merged History Encounter **       Social Determinants of Health   Financial Resource Strain:   . Difficulty of Paying Living Expenses:   Food Insecurity:   . Worried About Charity fundraiser in the Last Year:   . Arboriculturist in the Last Year:   Transportation Needs:   . Film/video editor (Medical):   Marland Kitchen Lack of Transportation (Non-Medical):   Physical Activity:   . Days of Exercise per Week:   . Minutes of Exercise per Session:   Stress:   . Feeling of Stress :   Social Connections:   . Frequency of Communication with Friends and Family:   . Frequency of Social Gatherings with Friends and Family:   . Attends Religious Services:   . Active Member of Clubs or Organizations:   . Attends Archivist Meetings:   Marland Kitchen Marital Status:   Intimate Partner Violence:   . Fear of Current or Ex-Partner:   . Emotionally Abused:   Marland Kitchen Physically Abused:   . Sexually Abused:     Past Surgical History:  Procedure Laterality Date  . BACK SURGERY    . CHOLECYSTECTOMY  2006  . IR GASTROSTOMY TUBE MOD SED  11/17/2016  . IR  GASTROSTOMY TUBE REMOVAL  01/18/2017  . S/P Hysterectomy  1990    Family History  Problem Relation Age of Onset  . Stroke Mother   . Hyperlipidemia Mother   . Diabetes Mellitus II Mother   . Hypertension Mother   . Heart disease Mother   . Stroke Father   . Hyperlipidemia Father   . Hypertension Father   . Heart disease Father   . Colon cancer Maternal Aunt   . Hypertension Son   . Anxiety disorder Son     PMHx, SurgHx, SocialHx, FamHx, Medications, and Allergies were reviewed in the Visit Navigator and updated as appropriate.   Patient Active Problem List   Diagnosis Date Noted  . Anxiety   . Anemia 01/22/2017  . Benign essential HTN   . Dysarthria, post-stroke   . Acute blood loss anemia   . 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 HLD (hyperlipidemia) 06/03/2007  . Allergic rhinitis 06/03/2007  . VAGINITIS, ATROPHIC 06/03/2007    Social History   Tobacco Use  . Smoking status: Never Smoker  . Smokeless tobacco: Never Used  Substance Use Topics  . Alcohol use: No  . Drug use: No    Current Medications and Allergies:    Current Outpatient Medications:  .  atorvastatin (LIPITOR) 40 MG tablet, TAKE 1 TABLET BY MOUTH DAILY AT 6PM, Disp: 90 tablet, Rfl: 1 .  clopidogrel (PLAVIX) 75 MG tablet, Take 1 tablet (75 mg total) by mouth daily., Disp: 90 tablet, Rfl: 3 .  sertraline (ZOLOFT) 50 MG tablet, Take 1 tablet (50 mg total) by mouth daily., Disp: 90 tablet, Rfl: 1   Allergies  Allergen Reactions  . Lisinopril Other (See Comments)    Dizziness and felt odd--cough     Review of Systems   ROS  Negative unless otherwise specified per HPI.  Vitals:   Vitals:   04/28/19 1421 04/28/19 1444  BP: (!) 160/90 (!) 150/90  Pulse: 91   Temp: 98 F (36.7 C)   TempSrc: Temporal   SpO2: 96%   Weight: 144 lb 8 oz (65.5 kg)   Height: 5' (1.524 m)      Body mass index is 28.22 kg/m.   Physical Exam:    Physical Exam Vitals and nursing note reviewed.  Constitutional:      General: She is not in acute distress.    Appearance: She is well-developed. She is not ill-appearing or toxic-appearing.  Cardiovascular:     Rate and Rhythm: Normal rate and regular rhythm.     Pulses: Normal pulses.     Heart sounds: Normal heart sounds, S1 normal and S2 normal.     Comments: No LE edema Pulmonary:     Effort: Pulmonary effort is normal.     Breath sounds: Normal breath sounds.  Skin:    General: Skin is warm and dry.  Neurological:     Mental Status: She is alert.     GCS: GCS eye subscore is 4. GCS verbal subscore is 5. GCS motor subscore is 6.  Psychiatric:        Speech: Speech normal.        Behavior: Behavior normal. Behavior is cooperative.       Assessment and Plan:    Alida was seen today for hyperlipidemia and anxiety.  Diagnoses and all orders for this visit:  Elevated alkaline phosphatase level Update labs today. Repeat DEXA as she is overdue. Further work-up based upon results. -  Cancel: Comprehensive metabolic panel -     DG Bone Density; Future -     Comprehensive metabolic panel; Future -     Comprehensive metabolic panel  Mixed hyperlipidemia Update lipid panel. LDL goal is < 70 mg/dL. Will assess need for medication change after labs have returned. -     Lipid panel; Future -     Lipid panel  Benign essential HTN Asymptomatic. BP goal is 130/90 -- will have patient maintain BP log so we can assess her BP in 2-4 weeks for in-office re-check.   Osteopenia of neck of femur, unspecified laterality Update DEXA.  Reviewed expectations re: course of current medical issues. Discussed self-management of symptoms. Outlined signs and symptoms indicating need for more acute intervention. Patient verbalized understanding and all questions were answered. See orders for this visit as documented in the electronic medical record. Patient received an After Visit Summary.  CMA or LPN served as scribe during this visit. History, Physical, and Plan performed by medical provider. The above documentation has been reviewed and is accurate and complete.  Inda Coke, PA-C [place] 04/28/2019  Follow-up: No follow-ups on file.

## 2019-05-01 ENCOUNTER — Ambulatory Visit: Payer: Medicare Other | Admitting: Physician Assistant

## 2019-05-02 ENCOUNTER — Ambulatory Visit (INDEPENDENT_AMBULATORY_CARE_PROVIDER_SITE_OTHER)
Admission: RE | Admit: 2019-05-02 | Discharge: 2019-05-02 | Disposition: A | Discharge status: Home / Self Care | Payer: Medicare PPO | Source: Ambulatory Visit | Attending: Physician Assistant | Admitting: Physician Assistant

## 2019-05-02 ENCOUNTER — Other Ambulatory Visit: Payer: Self-pay

## 2019-05-02 DIAGNOSIS — R748 Abnormal levels of other serum enzymes: Secondary | ICD-10-CM

## 2019-05-02 DIAGNOSIS — M85859 Other specified disorders of bone density and structure, unspecified thigh: Secondary | ICD-10-CM | POA: Diagnosis not present

## 2019-05-06 ENCOUNTER — Other Ambulatory Visit: Payer: Self-pay | Admitting: Physician Assistant

## 2019-05-19 ENCOUNTER — Encounter: Payer: Self-pay | Admitting: Physician Assistant

## 2019-05-19 ENCOUNTER — Ambulatory Visit (INDEPENDENT_AMBULATORY_CARE_PROVIDER_SITE_OTHER): Payer: Medicare PPO | Admitting: Physician Assistant

## 2019-05-19 ENCOUNTER — Other Ambulatory Visit: Payer: Self-pay

## 2019-05-19 VITALS — BP 178/102 | HR 82 | Temp 98.2°F | Ht 60.0 in | Wt 145.8 lb

## 2019-05-19 DIAGNOSIS — M810 Age-related osteoporosis without current pathological fracture: Secondary | ICD-10-CM | POA: Diagnosis not present

## 2019-05-19 DIAGNOSIS — I10 Essential (primary) hypertension: Secondary | ICD-10-CM | POA: Diagnosis not present

## 2019-05-19 MED ORDER — ALENDRONATE SODIUM 70 MG PO TABS
70.0000 mg | ORAL_TABLET | ORAL | 11 refills | Status: DC
Start: 2019-05-19 — End: 2020-04-16

## 2019-05-19 MED ORDER — LOSARTAN POTASSIUM 25 MG PO TABS: 25.0000 mg | ORAL_TABLET | Freq: Every day | ORAL | 0 refills | Status: DC

## 2019-05-19 NOTE — Patient Instructions (Signed)
It was great to see you!  1. Start losartan 25 mg daily for your blood pressure. Continue to monitor your blood pressure and follow-up with me in 1 month or so to check on this.  2. At a minimum, recommend 800 IU of vitamin D and 1200mg  of Calcium per day. You can get this with a calcium-vitamin D supplement.  Once you have the above in place, I would start taking fosamax 70mg  once a week.  Administer first thing in the morning and >30 minutes before the first food, beverage (except plain water), or other medication of the day. Do not take with mineral water or with other beverages. Stay upright (not to lie down) for at least 30 minutes after taking medicine and until after first food of the day (to reduce irritation). Must be taken with 6 to 8 oz of plain water. The tablet should be swallowed whole; do not chew or suck.  Let's follow-up in 1-2 months, sooner if you have concerns.  Due to recent changes in healthcare laws, you may see the results of your imaging and laboratory studies on MyChart before your provider has had a chance to review them.  We understand that in some cases there may be results that are confusing or concerning to you. Not all laboratory results come back in the same time frame and the provider may be waiting for multiple results in order to interpret others.  Please give 48 hours in order for your provider to thoroughly review all the results before contacting the office for clarification of your results.   Take care,  PA-C

## 2019-05-19 NOTE — Progress Notes (Signed)
[name] is a 79 y.o. female is here to discuss: osteoporosis and follow up on blood pressure.  I acted as a Education administrator for [employer], PA-C [employer], [place]  History of Present Illness:   Chief Complaint  Patient presents with  . Hypertension  . Osteoporosis    HPI    HTN Not currently taking any medications for BP. At home blood pressure readings are: in the 140s/90s. Patient denies chest pain, SOB, blurred vision, dizziness, unusual headaches, lower leg swelling.  Denies excessive caffeine intake, stimulant usage, excessive alcohol intake, or increase in salt consumption.  BP Readings from Last 3 Encounters:  05/19/19 (!) 178/102  04/28/19 (!) 150/90  10/31/18 140/86    Osteoporosis New diagnosis based on DEXA recently completed. She is not on any medications presently.  There are no preventive care reminders to display for this patient.  Past Medical History:  Diagnosis Date  . Allergic rhinitis 06/03/2007  . Anxiety   . Arthritis   . 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 HLD (hyperlipidemia)   . 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 Vitamin D deficiency      Social History   Socioeconomic History  . Marital status: Widowed    Spouse name: Not on file  . Number of children: Not on file  . Years of education: Not on file  . Highest education level: Not on file  Occupational History  . Not on file  Tobacco Use  . Smoking status: Never Smoker  . Smokeless tobacco: Never Used  Substance and Sexual Activity  . Alcohol use: No  . Drug use: No  . Sexual activity: Never  Other Topics Concern  . Not on file  Social History Narrative  . Not on file   Social Determinants of Health   Financial Resource Strain:   . Difficulty of Paying Living Expenses:   Food Insecurity:   . Worried About Charity fundraiser in the Last Year:   . Arboriculturist in the Last Year:   Transportation Needs:   . Film/video editor (Medical):   Marland Kitchen Lack of  Transportation (Non-Medical):   Physical Activity:   . Days of Exercise per Week:   . Minutes of Exercise per Session:   Stress:   . Feeling of Stress :   Social Connections:   . Frequency of Communication with Friends and Family:   . Frequency of Social Gatherings with Friends and Family:   . Attends Religious Services:   . Active Member of Clubs or Organizations:   . Attends Archivist Meetings:   Marland Kitchen Marital Status:   Intimate Partner Violence:   . Fear of Current or Ex-Partner:   . Emotionally Abused:   Marland Kitchen Physically Abused:   . Sexually Abused:     Past Surgical History:  Procedure Laterality Date  . BACK SURGERY    . CHOLECYSTECTOMY  2006  . IR GASTROSTOMY TUBE MOD SED  11/17/2016  . IR GASTROSTOMY TUBE REMOVAL  01/18/2017  . S/P Hysterectomy  1990    Family History  Problem Relation Age of Onset  . Stroke Mother   . Hyperlipidemia Mother   . Diabetes Mellitus II Mother   . Hypertension Mother   . Heart disease Mother   . Stroke Father   . Hyperlipidemia Father   . Hypertension Father   . Heart disease Father   . Colon cancer Maternal Aunt   .  Hypertension Son   . Anxiety disorder Son     PMHx, SurgHx, SocialHx, FamHx, Medications, and Allergies were reviewed in the Visit Navigator and updated as appropriate.   Patient Active Problem List   Diagnosis Date Noted  . Anxiety   . Anemia 01/22/2017  . Benign essential HTN   . Dysarthria, post-stroke   . Acute blood loss anemia   . 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 HLD (hyperlipidemia) 06/03/2007  . Allergic rhinitis 06/03/2007  . VAGINITIS, ATROPHIC 06/03/2007    Social History   Tobacco Use  . Smoking status: Never Smoker  . Smokeless tobacco: Never Used  Substance Use Topics  . Alcohol use: No  . Drug use: No    Current Medications and Allergies:    Current Outpatient Medications:  .  atorvastatin (LIPITOR) 40 MG  tablet, TAKE 1 TABLET BY MOUTH DAILY AT 6 PM, Disp: 90 tablet, Rfl: 1 .  clopidogrel (PLAVIX) 75 MG tablet, Take 1 tablet (75 mg total) by mouth daily., Disp: 90 tablet, Rfl: 3 .  sertraline (ZOLOFT) 50 MG tablet, TAKE 1 TABLET(50 MG) BY MOUTH DAILY, Disp: 90 tablet, Rfl: 1 .  alendronate (FOSAMAX) 70 MG tablet, Take 1 tablet (70 mg total) by mouth every 7 (seven) days. Take with a full glass of water on an empty stomach., Disp: 4 tablet, Rfl: 11 .  losartan (COZAAR) 25 MG tablet, Take 1 tablet (25 mg total) by mouth daily., Disp: 90 tablet, Rfl: 0   Allergies  Allergen Reactions  . Lisinopril Other (See Comments)    Dizziness and felt odd--cough     Review of Systems   ROS  Negative unless otherwise specified per HPI.  Vitals:   Vitals:   05/19/19 1105  BP: (!) 178/102  Pulse: 82  Temp: 98.2 F (36.8 C)  TempSrc: Temporal  SpO2: 93%  Weight: 145 lb 12.8 oz (66.1 kg)  Height: 5' (1.524 m)     Body mass index is 28.47 kg/m.   Physical Exam:    Physical Exam Vitals and nursing note reviewed.  Constitutional:      General: She is not in acute distress.    Appearance: She is well-developed. She is not ill-appearing or toxic-appearing.  Cardiovascular:     Rate and Rhythm: Normal rate and regular rhythm.     Pulses: Normal pulses.     Heart sounds: Normal heart sounds, S1 normal and S2 normal.     Comments: No LE edema Pulmonary:     Effort: Pulmonary effort is normal.     Breath sounds: Normal breath sounds.  Skin:    General: Skin is warm and dry.  Neurological:     Mental Status: She is alert.     GCS: GCS eye subscore is 4. GCS verbal subscore is 5. GCS motor subscore is 6.  Psychiatric:        Speech: Speech normal.        Behavior: Behavior normal. Behavior is cooperative.      Assessment and Plan:    [name] was seen today for hypertension and osteoporosis.  Diagnoses and all orders for this visit:  Benign essential HTN Uncontrolled, goal is  130/90. Will restart Losartan 25 mg, follow-up in 1 month, continue to monitor blood pressures.   Osteoporosis without current pathological fracture, unspecified osteoporosis type New diagnosis. Start Vitamin D and Calcium supplementation (recommendations on AVS) and then start weekly fosamax. Patient verbalized understanding to plan.  Other orders -     losartan (COZAAR) 25 MG tablet; Take 1 tablet (25 mg total) by mouth daily. -     alendronate (FOSAMAX) 70 MG tablet; Take 1 tablet (70 mg total) by mouth every 7 (seven) days. Take with a full glass of water on an empty stomach.  Reviewed expectations re: course of current medical issues. Discussed self-management of symptoms. Outlined signs and symptoms indicating need for more acute intervention. Patient verbalized understanding and all questions were answered. See orders for this visit as documented in the electronic medical record. Patient received an After Visit Summary.  CMA or LPN served as scribe during this visit. History, Physical, and Plan performed by medical provider. The above documentation has been reviewed and is accurate and complete.  Jarold Motto, PA-C [facility] 05/19/2019  Follow-up: No follow-ups on file.

## 2019-07-04 ENCOUNTER — Encounter: Payer: Self-pay | Admitting: Physician Assistant

## 2019-07-04 ENCOUNTER — Ambulatory Visit (INDEPENDENT_AMBULATORY_CARE_PROVIDER_SITE_OTHER): Payer: Medicare PPO | Admitting: Physician Assistant

## 2019-07-04 ENCOUNTER — Other Ambulatory Visit: Payer: Self-pay

## 2019-07-04 VITALS — BP 132/88 | HR 92 | Temp 98.0°F | Ht 60.0 in | Wt 146.0 lb

## 2019-07-04 DIAGNOSIS — M810 Age-related osteoporosis without current pathological fracture: Secondary | ICD-10-CM

## 2019-07-04 DIAGNOSIS — I10 Essential (primary) hypertension: Secondary | ICD-10-CM

## 2019-07-04 NOTE — Progress Notes (Signed)
[name] is a 79 y.o. female is here to for a follow up on blood pressure and osteoporosis.  I acted as a Neurosurgeon for [employer], PA-C [employer], [place]  History of Present Illness:   Chief Complaint  Patient presents with   Hypertension   Osteoporosis    HPI  HTN Restarting Losartan 25 mg for BP. At home blood pressure readings are: <140/90. Patient denies chest pain, SOB, blurred vision, dizziness, unusual headaches, lower leg swelling.  Denies excessive caffeine intake, stimulant usage, excessive alcohol intake, or increase in salt consumption. Going to [employer]. Recently restarted her exercise classes -- for 2-3 months.   BP Readings from Last 3 Encounters:  07/04/19 132/88  05/19/19 (!) 178/102  04/28/19 (!) 150/90    Osteoporosis Started taking Vitamin D and calcium at last month's visit. Also started taking Fosamax weekly. She is tolerating this well without any concerns -- denies GERD.    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    HLD (hyperlipidemia)    Melanoma in situ ([facility])    Vitamin D deficiency      Social History   Tobacco Use   Smoking status: Never Smoker   Smokeless tobacco: Never Used  Vaping Use   Vaping Use: Never used  Substance Use Topics   Alcohol use: No   Drug use: No    Past Surgical History:  Procedure Laterality Date   BACK SURGERY     CHOLECYSTECTOMY  2006   IR GASTROSTOMY TUBE MOD SED  11/17/2016   IR GASTROSTOMY TUBE REMOVAL  01/18/2017   S/P Hysterectomy  1990    Family History  Problem Relation Age of Onset   Stroke Mother    Hyperlipidemia Mother    Diabetes Mellitus II Mother    Hypertension Mother    Heart disease Mother    Stroke Father    Hyperlipidemia Father    Hypertension  Father    Heart disease Father    Colon cancer Maternal Aunt    Hypertension Son    Anxiety disorder Son     PMHx, SurgHx, SocialHx, FamHx, Medications, and Allergies were reviewed in the Visit Navigator and updated as appropriate.   Patient Active Problem List   Diagnosis Date Noted   Osteoporosis without current pathological fracture 07/04/2019   Anxiety    Anemia 01/22/2017   Benign essential HTN    Dysarthria, post-stroke    Acute blood loss anemia    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   Degenerative spondylolisthesis 06/01/2016   HLD (hyperlipidemia) 06/03/2007   Allergic rhinitis 06/03/2007   VAGINITIS, ATROPHIC 06/03/2007    Social History   Tobacco Use   Smoking status: Never Smoker   Smokeless tobacco: Never Used  Vaping Use   Vaping Use: Never used  Substance Use Topics   Alcohol use: No   Drug use: No    Current Medications and Allergies:    Current Outpatient Medications:    alendronate (FOSAMAX) 70 MG tablet, Take 1 tablet (70 mg total) by mouth every 7 (seven) days. Take with a full glass of water on an empty stomach., Disp: 4 tablet, Rfl: 11   atorvastatin (LIPITOR) 40 MG tablet, TAKE 1 TABLET BY MOUTH DAILY AT  6 PM, Disp: 90 tablet, Rfl: 1   clopidogrel (PLAVIX) 75 MG tablet, Take 1 tablet (75 mg total) by mouth daily., Disp: 90 tablet, Rfl: 3   losartan (COZAAR) 25 MG tablet, Take 1 tablet (25 mg total) by mouth daily., Disp: 90 tablet, Rfl: 0   sertraline (ZOLOFT) 50 MG tablet, TAKE 1 TABLET(50 MG) BY MOUTH DAILY, Disp: 90 tablet, Rfl: 1   Allergies  Allergen Reactions   Lisinopril Other (See Comments)    Dizziness and felt odd--cough     Review of Systems   ROS  Negative unless otherwise specified per HPI.  Vitals:   Vitals:   07/04/19 1054  BP: 132/88  Pulse: 92  Temp: 98 F (36.7 C)  TempSrc: Temporal  SpO2: 94%  Weight: 146 lb (66.2 kg)  Height: 5' (1.524 m)     Body mass index  is 28.51 kg/m.   Physical Exam:    Physical Exam Vitals and nursing note reviewed.  Constitutional:      General: She is not in acute distress.    Appearance: She is well-developed. She is not ill-appearing or toxic-appearing.  Cardiovascular:     Rate and Rhythm: Normal rate and regular rhythm.     Pulses: Normal pulses.     Heart sounds: Normal heart sounds, S1 normal and S2 normal.     Comments: No LE edema Pulmonary:     Effort: Pulmonary effort is normal.     Breath sounds: Normal breath sounds.  Skin:    General: Skin is warm and dry.  Neurological:     Mental Status: She is alert.     GCS: GCS eye subscore is 4. GCS verbal subscore is 5. GCS motor subscore is 6.  Psychiatric:        Speech: Speech normal.        Behavior: Behavior normal. Behavior is cooperative.      Assessment and Plan:    [name] was seen today for hypertension and osteoporosis.  Diagnoses and all orders for this visit:  Benign essential HTN Improved. Will continue current regimen of Losartan 25 mg daily. Follow-up in 6 months, sooner if concerns.  Osteoporosis without current pathological fracture, unspecified osteoporosis type Tolerating fosamax, vit D and calcium. Continues strength training as well. Continue to monitor.  Reviewed expectations re: course of current medical issues. Discussed self-management of symptoms. Outlined signs and symptoms indicating need for more acute intervention. Patient verbalized understanding and all questions were answered. See orders for this visit as documented in the electronic medical record. Patient received an After Visit Summary.  CMA or LPN served as scribe during this visit. History, Physical, and Plan performed by medical provider. The above documentation has been reviewed and is accurate and complete.  Inda Coke, PA-C , [facility] 07/04/2019  Follow-up: No follow-ups on file.

## 2019-07-04 NOTE — Patient Instructions (Signed)
It was great to see you!      Due to recent changes in healthcare laws, you may see the results of your imaging and laboratory studies on MyChart before your provider has had a chance to review them.  We understand that in some cases there may be results that are confusing or concerning to you. Not all laboratory results come back in the same time frame and the provider may be waiting for multiple results in order to interpret others.  Please give [place] 48 hours in order for your provider to thoroughly review all the results before contacting the office for clarification of your results.   Take care,  Jarold Motto PA-C

## 2019-08-09 ENCOUNTER — Other Ambulatory Visit: Payer: Self-pay | Admitting: Physician Assistant

## 2019-11-02 ENCOUNTER — Other Ambulatory Visit: Payer: Self-pay | Admitting: Physician Assistant

## 2019-11-08 ENCOUNTER — Telehealth: Payer: Self-pay | Admitting: Physician Assistant

## 2019-11-08 NOTE — Telephone Encounter (Signed)
Left message for patient to call back and schedule Medicare Annual Wellness Visit (AWV) either virtually/audio only OR in office. Whatever the patients preference is.  Last AWV 10/17/2018; please schedule at anytime with [facility]-Nurse Health Advisor at [facility].  This should be a 45 minute visit.   

## 2020-01-02 ENCOUNTER — Encounter: Payer: Self-pay | Admitting: Physician Assistant

## 2020-01-02 ENCOUNTER — Ambulatory Visit (INDEPENDENT_AMBULATORY_CARE_PROVIDER_SITE_OTHER): Payer: Medicare PPO | Admitting: Physician Assistant

## 2020-01-02 ENCOUNTER — Other Ambulatory Visit: Payer: Self-pay

## 2020-01-02 VITALS — BP 146/80 | HR 94 | Temp 98.3°F | Ht 60.0 in | Wt 153.2 lb

## 2020-01-02 DIAGNOSIS — I10 Essential (primary) hypertension: Secondary | ICD-10-CM | POA: Diagnosis not present

## 2020-01-02 MED ORDER — LOSARTAN POTASSIUM 50 MG PO TABS: 50.0000 mg | ORAL_TABLET | Freq: Every day | ORAL | 1 refills | Status: DC

## 2020-01-02 NOTE — Progress Notes (Signed)
[name] is a 79 y.o. female is here for follow up.  I acted as a Neurosurgeon for [employer], PA-C Corky Mull, LPN   History of Present Illness:   Chief Complaint  Patient presents with  . Hypertension    HPI   Hypertension Pt here for follow up, currently taking Losartan 25 mg daily. Pt denies headaches, dizziness, blurred vision, chest pain, SOB or lower leg edema. Denies excessive caffeine intake, stimulant usage, excessive alcohol intake or increase in salt consumption.  She does not check blood pressure at home.  BP Readings from Last 3 Encounters:  01/02/20 (!) 146/80  07/04/19 132/88  05/19/19 (!) 178/102    Wt Readings from Last 5 Encounters:  01/02/20 153 lb 4 oz (69.5 kg)  07/04/19 146 lb (66.2 kg)  05/19/19 145 lb 12.8 oz (66.1 kg)  04/28/19 144 lb 8 oz (65.5 kg)  10/31/18 145 lb 4 oz (65.9 kg)      Health Maintenance Due  Topic Date Due  . Hepatitis C Screening  Never done    Past Medical History:  Diagnosis Date  . Allergic rhinitis 06/03/2007  . Anxiety   . Arthritis   . 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 HLD (hyperlipidemia)   . Melanoma in situ ([facility])   . Vitamin D deficiency      Social History   Tobacco Use  . Smoking status: Never Smoker  . Smokeless tobacco: Never Used  Vaping Use  . Vaping Use: Never used  Substance Use Topics  . Alcohol use: No  . Drug use: No    Past Surgical History:  Procedure Laterality Date  . BACK SURGERY    . CHOLECYSTECTOMY  2006  . IR GASTROSTOMY TUBE MOD SED  11/17/2016  . IR GASTROSTOMY TUBE REMOVAL  01/18/2017  . S/P Hysterectomy  1990    Family History  Problem Relation Age of Onset  . Stroke Mother   . Hyperlipidemia Mother   . Diabetes Mellitus II Mother   . Hypertension Mother   . Heart disease Mother   . Stroke Father   . Hyperlipidemia Father   . Hypertension Father   . Heart disease Father   . Colon cancer Maternal Aunt   . Hypertension Son   .  Anxiety disorder Son     PMHx, SurgHx, SocialHx, FamHx, Medications, and Allergies were reviewed in the Visit Navigator and updated as appropriate.   Patient Active Problem List   Diagnosis Date Noted  . Osteoporosis without current pathological fracture 07/04/2019  . Anxiety   . Anemia 01/22/2017  . Benign essential HTN   . Dysarthria, post-stroke   . Acute blood loss anemia   . 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 HLD (hyperlipidemia) 06/03/2007  . Allergic rhinitis 06/03/2007  . VAGINITIS, ATROPHIC 06/03/2007    Social History   Tobacco Use  . Smoking status: Never Smoker  . Smokeless tobacco: Never Used  Vaping Use  . Vaping Use: Never used  Substance Use Topics  . Alcohol use: No  . Drug use: No    Current Medications and Allergies:    Current Outpatient Medications:  .  alendronate (FOSAMAX) 70 MG tablet, Take 1 tablet (70 mg total) by mouth every 7 (seven) days. Take with a full glass of water on an empty stomach., Disp: 4 tablet, Rfl: 11 .  atorvastatin (LIPITOR) 40 MG tablet, TAKE 1 TABLET BY  MOUTH DAILY AT 6 PM, Disp: 90 tablet, Rfl: 1 .  clopidogrel (PLAVIX) 75 MG tablet, TAKE 1 TABLET(75 MG) BY MOUTH DAILY, Disp: 90 tablet, Rfl: 3 .  sertraline (ZOLOFT) 50 MG tablet, TAKE 1 TABLET(50 MG) BY MOUTH DAILY, Disp: 90 tablet, Rfl: 1 .  losartan (COZAAR) 50 MG tablet, Take 1 tablet (50 mg total) by mouth daily., Disp: 90 tablet, Rfl: 1   Allergies  Allergen Reactions  . Lisinopril Other (See Comments)    Dizziness and felt odd--cough     Review of Systems   ROS Negative unless otherwise specified per HPI.  Vitals:   Vitals:   01/02/20 1058  BP: (!) 146/80  Pulse: 94  Temp: 98.3 F (36.8 C)  TempSrc: Temporal  SpO2: 96%  Weight: 153 lb 4 oz (69.5 kg)  Height: 5' (1.524 m)     Body mass index is 29.93 kg/m.   Physical Exam:    Physical Exam Vitals and nursing note reviewed.   Constitutional:      General: She is not in acute distress.    Appearance: She is well-developed. She is not ill-appearing, toxic-appearing or sickly-appearing.  Cardiovascular:     Rate and Rhythm: Normal rate and regular rhythm.     Pulses: Normal pulses.     Heart sounds: Normal heart sounds, S1 normal and S2 normal.  Pulmonary:     Effort: Pulmonary effort is normal.     Breath sounds: Normal breath sounds.  Skin:    General: Skin is warm, dry and intact.  Neurological:     Mental Status: She is alert.     GCS: GCS eye subscore is 4. GCS verbal subscore is 5. GCS motor subscore is 6.  Psychiatric:        Mood and Affect: Mood and affect normal.        Speech: Speech normal.        Behavior: Behavior normal. Behavior is cooperative.      Assessment and Plan:    [name] was seen today for hypertension.  Diagnoses and all orders for this visit:  Benign essential HTN Above goal of 130/90. Given hx of stroke, will be conservative and increase losartan to 50 mg daily. Follow-up in 3 months, sooner if concerns.  Other orders -     losartan (COZAAR) 50 MG tablet; Take 1 tablet (50 mg total) by mouth daily.    CMA or LPN served as scribe during this visit. History, Physical, and Plan performed by medical provider. The above documentation has been reviewed and is accurate and complete.   Jarold Motto, PA-C [place] 01/02/2020  Follow-up: No follow-ups on file.

## 2020-01-02 NOTE — Patient Instructions (Signed)
It was great to see you!  Increase losartan to 50 mg daily.  Our blood pressure goal for you is 130/90.  Follow-up in 3 months, sooner if concerns.  Take care,  Jarold Motto PA-C

## 2020-04-01 ENCOUNTER — Encounter: Payer: Self-pay | Admitting: Physician Assistant

## 2020-04-01 ENCOUNTER — Ambulatory Visit (INDEPENDENT_AMBULATORY_CARE_PROVIDER_SITE_OTHER): Payer: Medicare PPO | Admitting: Physician Assistant

## 2020-04-01 ENCOUNTER — Other Ambulatory Visit: Payer: Self-pay

## 2020-04-01 VITALS — BP 146/90 | HR 101 | Temp 97.2°F | Ht 60.0 in | Wt 151.0 lb

## 2020-04-01 DIAGNOSIS — I10 Essential (primary) hypertension: Secondary | ICD-10-CM

## 2020-04-01 LAB — CBC WITH DIFFERENTIAL/PLATELET
Basophils Absolute: 0 10*3/uL (ref 0.0–0.1)
Basophils Relative: 0.4 % (ref 0.0–3.0)
Eosinophils Absolute: 0.1 10*3/uL (ref 0.0–0.7)
Eosinophils Relative: 1.9 % (ref 0.0–5.0)
HCT: 42.6 % (ref 36.0–46.0)
Hemoglobin: 14.5 g/dL (ref 12.0–15.0)
Lymphocytes Relative: 19.4 % (ref 12.0–46.0)
Lymphs Abs: 1.3 10*3/uL (ref 0.7–4.0)
MCHC: 34 g/dL (ref 30.0–36.0)
MCV: 90.4 fl (ref 78.0–100.0)
Monocytes Absolute: 0.5 10*3/uL (ref 0.1–1.0)
Monocytes Relative: 7.9 % (ref 3.0–12.0)
Neutro Abs: 4.7 10*3/uL (ref 1.4–7.7)
Neutrophils Relative %: 70.4 % (ref 43.0–77.0)
Platelets: 236 10*3/uL (ref 150.0–400.0)
RBC: 4.71 Mil/uL (ref 3.87–5.11)
RDW: 13.6 % (ref 11.5–15.5)
WBC: 6.6 10*3/uL (ref 4.0–10.5)

## 2020-04-01 LAB — COMPREHENSIVE METABOLIC PANEL
ALT: 22 U/L (ref 0–35)
AST: 21 U/L (ref 0–37)
Albumin: 4.7 g/dL (ref 3.5–5.2)
Alkaline Phosphatase: 112 U/L (ref 39–117)
BUN: 26 mg/dL — ABNORMAL HIGH (ref 6–23)
CO2: 30 mEq/L (ref 19–32)
Calcium: 10.1 mg/dL (ref 8.4–10.5)
Chloride: 102 mEq/L (ref 96–112)
Creatinine, Ser: 0.86 mg/dL (ref 0.40–1.20)
GFR: 65.11 mL/min (ref >60.00)
Glucose, Bld: 102 mg/dL — ABNORMAL HIGH (ref 70–99)
Potassium: 4.6 mEq/L (ref 3.5–5.1)
Sodium: 138 mEq/L (ref 135–145)
Total Bilirubin: 0.7 mg/dL (ref 0.2–1.2)
Total Protein: 7.2 g/dL (ref 6.0–8.3)

## 2020-04-01 MED ORDER — LOSARTAN POTASSIUM 50 MG PO TABS: 50.0000 mg | ORAL_TABLET | Freq: Every day | ORAL | 1 refills | Status: DC

## 2020-04-01 NOTE — Progress Notes (Signed)
[name] is a 80 y.o. female is here for follow up.  I acted as a Neurosurgeon for [employer], PA-C Corky Mull, LPN   History of Present Illness:   Chief Complaint  Patient presents with  . Hypertension    HPI   Hypertension  Pt here for 3 month f/u, currently taking Losartan 50 mg daily. Compliant with medication. She is not checking blood pressure at home. Pt denies headaches, dizziness, blurred vision, chest pain, SOB or lower leg edema. Denies excessive caffeine intake, stimulant usage, excessive alcohol intake or increase in salt consumption.  Wt Readings from Last 4 Encounters:  04/01/20 151 lb (68.5 kg)  01/02/20 153 lb 4 oz (69.5 kg)  07/04/19 146 lb (66.2 kg)  05/19/19 145 lb 12.8 oz (66.1 kg)     Health Maintenance Due  Topic Date Due  . Hepatitis C Screening  Never done    Past Medical History:  Diagnosis Date  . Allergic rhinitis 06/03/2007  . Anxiety   . Arthritis   . 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 HLD (hyperlipidemia)   . Melanoma in situ (HCC)   . Vitamin D deficiency      Social History   Tobacco Use  . Smoking status: Never Smoker  . Smokeless tobacco: Never Used  Vaping Use  . Vaping Use: Never used  Substance Use Topics  . Alcohol use: No  . Drug use: No    Past Surgical History:  Procedure Laterality Date  . BACK SURGERY    . CHOLECYSTECTOMY  2006  . IR GASTROSTOMY TUBE MOD SED  11/17/2016  . IR GASTROSTOMY TUBE REMOVAL  01/18/2017  . S/P Hysterectomy  1990    Family History  Problem Relation Age of Onset  . Stroke Mother   . Hyperlipidemia Mother   . Diabetes Mellitus II Mother   . Hypertension Mother   . Heart disease Mother   . Stroke Father   . Hyperlipidemia Father   . Hypertension Father   . Heart disease Father   . Colon cancer Maternal Aunt   . Hypertension Son   . Anxiety disorder Son     PMHx, SurgHx, SocialHx, FamHx, Medications, and Allergies were reviewed in the Visit  Navigator and updated as appropriate.   Patient Active Problem List   Diagnosis Date Noted  . Osteoporosis without current pathological fracture 07/04/2019  . Anxiety   . Anemia 01/22/2017  . Benign essential HTN   . Dysarthria, post-stroke   . Acute blood loss anemia   . 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 HLD (hyperlipidemia) 06/03/2007  . Allergic rhinitis 06/03/2007  . VAGINITIS, ATROPHIC 06/03/2007    Social History   Tobacco Use  . Smoking status: Never Smoker  . Smokeless tobacco: Never Used  Vaping Use  . Vaping Use: Never used  Substance Use Topics  . Alcohol use: No  . Drug use: No    Current Medications and Allergies:    Current Outpatient Medications:  .  alendronate (FOSAMAX) 70 MG tablet, Take 1 tablet (70 mg total) by mouth every 7 (seven) days. Take with a full glass of water on an empty stomach., Disp: 4 tablet, Rfl: 11 .  atorvastatin (LIPITOR) 40 MG tablet, TAKE 1 TABLET BY MOUTH DAILY AT 6 PM, Disp: 90 tablet, Rfl: 1 .  clopidogrel (PLAVIX) 75 MG tablet, TAKE 1 TABLET(75 MG) BY MOUTH DAILY, Disp: 90 tablet,  Rfl: 3 .  losartan (COZAAR) 50 MG tablet, Take 1 tablet (50 mg total) by mouth daily., Disp: 90 tablet, Rfl: 1 .  sertraline (ZOLOFT) 50 MG tablet, TAKE 1 TABLET(50 MG) BY MOUTH DAILY, Disp: 90 tablet, Rfl: 1   Allergies  Allergen Reactions  . Lisinopril Other (See Comments)    Dizziness and felt odd--cough     Review of Systems   ROS Negative unless otherwise specified per HPI.  Vitals:   Vitals:   04/01/20 1334  BP: (!) 146/90  Pulse: (!) 101  Temp: (!) 97.2 F (36.2 C)  TempSrc: Temporal  SpO2: 95%  Weight: 151 lb (68.5 kg)  Height: 5' (1.524 m)     Body mass index is 29.49 kg/m.   Physical Exam:    Physical Exam Vitals and nursing note reviewed.  Constitutional:      General: She is not in acute distress.    Appearance: She is well-developed. She is  not ill-appearing or toxic-appearing.  Cardiovascular:     Rate and Rhythm: Normal rate and regular rhythm.     Pulses: Normal pulses.     Heart sounds: Normal heart sounds, S1 normal and S2 normal.     Comments: No LE edema Pulmonary:     Effort: Pulmonary effort is normal.     Breath sounds: Normal breath sounds.  Skin:    General: Skin is warm and dry.  Neurological:     Mental Status: She is alert.     GCS: GCS eye subscore is 4. GCS verbal subscore is 5. GCS motor subscore is 6.  Psychiatric:        Speech: Speech normal.        Behavior: Behavior normal. Behavior is cooperative.      Assessment and Plan:    [name] was seen today for hypertension.  Diagnoses and all orders for this visit:  Benign essential HTN -     Comprehensive metabolic panel -     CBC with Differential/Platelet   Blood pressure above goal of 130/90. She is resistant to increasing medication at this time. Will continue Losartan 50 mg daily. Update CBC and CMP today. Instructed patient as follows: "Please make sure you check your blood pressure a few times a week. According to the neurologist, our ideal blood pressure goal for you is 130/90 or lower. If you are not getting these numbers, I need to know so I can increase your blood pressure medication to Losartan 75 mg. Follow-up in 6 weeks."  CMA or LPN served as scribe during this visit. History, Physical, and Plan performed by medical provider. The above documentation has been reviewed and is accurate and complete.   Jarold Motto, PA-C [place] [place] 04/01/2020  Follow-up: No follow-ups on file.

## 2020-04-01 NOTE — Patient Instructions (Addendum)
It was great to see you!  Please make sure you check your blood pressure a few times a week.  According to the neurologist, our ideal blood pressure goal for you is 130/90 or lower. If you are not getting these numbers, I need to know so I can increase your blood pressure medication to Losartan 75 mg.   Let's follow-up in 6 weeks.  Take care,  Jarold Motto PA-C    How to Take Your Blood Pressure Blood pressure is a measurement of how strongly your blood is pressing against the walls of your arteries. Arteries are blood vessels that carry blood from your heart throughout your body. Your health care provider takes your blood pressure at each office visit. You can also take your own blood pressure at home with a blood pressure monitor. You may need to take your own blood pressure to:  Confirm a diagnosis of high blood pressure (hypertension).  Monitor your blood pressure over time.  Make sure your blood pressure medicine is working. Supplies needed:  Blood pressure monitor.  Dining room chair to sit in.  Table or desk.  Small notebook and pencil or pen. How to prepare To get the most accurate reading, avoid the following for 30 minutes before you check your blood pressure:  Drinking caffeine.  Drinking alcohol.  Eating.  Smoking.  Exercising. Five minutes before you check your blood pressure:  Use the bathroom and urinate so that you have an empty bladder.  Sit quietly in a dining room chair. Do not sit in a soft couch or an armchair. Do not talk. How to take your blood pressure To check your blood pressure, follow the instructions in the manual that came with your blood pressure monitor. If you have a digital blood pressure monitor, the instructions may be as follows: 1. Sit up straight in a chair. 2. Place your feet on the floor. Do not cross your ankles or legs. 3. Rest your left arm at the level of your heart on a table or desk or on the arm of a  chair. 4. Pull up your shirt sleeve. 5. Wrap the blood pressure cuff around the upper part of your left arm, 1 inch (2.5 cm) above your elbow. It is best to wrap the cuff around bare skin. 6. Fit the cuff snugly around your arm. You should be able to place only one finger between the cuff and your arm. 7. Position the cord so that it rests in the bend of your elbow. 8. Press the power button. 9. Sit quietly while the cuff inflates and deflates. 10. Read the digital reading on the monitor screen and write the numbers down (record them) in a notebook. 11. Wait 2-3 minutes, then repeat the steps, starting at step 1.   What does my blood pressure reading mean? A blood pressure reading consists of a higher number over a lower number. Ideally, your blood pressure should be below 120/80. The first ("top") number is called the systolic pressure. It is a measure of the pressure in your arteries as your heart beats. The second ("bottom") number is called the diastolic pressure. It is a measure of the pressure in your arteries as the heart relaxes. Blood pressure is classified into five stages. The following are the stages for adults who do not have a short-term serious illness or a chronic condition. Systolic pressure and diastolic pressure are measured in a unit called mm Hg (millimeters of mercury).  Normal  Systolic pressure: below  120.  Diastolic pressure: below 80. Elevated  Systolic pressure: 120-129.  Diastolic pressure: below 80. Hypertension stage 1  Systolic pressure: 130-139.  Diastolic pressure: 80-89. Hypertension stage 2  Systolic pressure: 140 or above.  Diastolic pressure: 90 or above. You can have elevated blood pressure or hypertension even if only the systolic or only the diastolic number in your reading is higher than normal. Follow these instructions at home:  Check your blood pressure as often as recommended by your health care provider.  Check your blood pressure at  the same time every day.  Take your monitor to the next appointment with your health care provider to make sure that: ? You are using it correctly. ? It provides accurate readings.  Be sure you understand what your goal blood pressure numbers are.  Tell your health care provider if you are having any side effects from blood pressure medicine.  Keep all follow-up visits as told by your health care provider. This is important. General tips  Your health care provider can suggest a reliable monitor that will meet your needs. There are several types of home blood pressure monitors.  Choose a monitor that has an arm cuff. Do not choose a monitor that measures your blood pressure from your wrist or finger.  Choose a cuff that wraps snugly around your upper arm. You should be able to fit only one finger between your arm and the cuff.  You can buy a blood pressure monitor at most drugstores or online. Where to find more information American Heart Association: www.heart.org Contact a health care provider if:  Your blood pressure is consistently high. Get help right away if:  Your systolic blood pressure is higher than 180.  Your diastolic blood pressure is higher than 120. Summary  Blood pressure is a measurement of how strongly your blood is pressing against the walls of your arteries.  A blood pressure reading consists of a higher number over a lower number. Ideally, your blood pressure should be below 120/80.  Check your blood pressure at the same time every day.  Avoid caffeine, alcohol, smoking, and exercise for 30 minutes prior to checking your blood pressure. These agents can affect the accuracy of the blood pressure reading. This information is not intended to replace advice given to you by your health care provider. Make sure you discuss any questions you have with your health care provider. Document Revised: 12/23/2018 Document Reviewed: 12/23/2018 Elsevier Patient Education   2021 [employer].

## 2020-04-02 ENCOUNTER — Telehealth: Payer: Self-pay | Admitting: Physician Assistant

## 2020-04-02 NOTE — Telephone Encounter (Signed)
Left message for patient to call back and schedule Medicare Annual Wellness Visit (AWV) either virtually OR in office.   Last AWV 10/17/18; please schedule at anytime with [facility]-Nurse Health Advisor at [facility].  This should be a 45 minute visit.

## 2020-04-14 ENCOUNTER — Other Ambulatory Visit: Payer: Self-pay | Admitting: Physician Assistant

## 2020-04-30 ENCOUNTER — Other Ambulatory Visit: Payer: Self-pay | Admitting: Physician Assistant

## 2020-05-14 ENCOUNTER — Ambulatory Visit: Payer: Medicare PPO | Admitting: Physician Assistant

## 2020-05-14 ENCOUNTER — Other Ambulatory Visit: Payer: Self-pay

## 2020-05-14 ENCOUNTER — Encounter: Payer: Self-pay | Admitting: Physician Assistant

## 2020-05-14 VITALS — BP 158/90 | HR 98 | Temp 97.2°F | Ht 60.0 in | Wt 153.4 lb

## 2020-05-14 DIAGNOSIS — I10 Essential (primary) hypertension: Secondary | ICD-10-CM

## 2020-05-14 MED ORDER — LOSARTAN POTASSIUM 50 MG PO TABS: 75.0000 mg | ORAL_TABLET | Freq: Every day | ORAL | 1 refills | Status: DC

## 2020-05-14 NOTE — Progress Notes (Signed)
[name] is a 80 y.o. female is here for follow up.  I acted as a Neurosurgeon for [employer], PA-C Corky Mull, LPN   History of Present Illness:   Chief Complaint  Patient presents with  . Hypertension    HPI   Hypertension Pt here for f/u on blood pressure. Currently taking Losartan 50 mg daily. She is checking blood pressure systolic 123-156, diastolic 68-96.Pt denies headaches, dizziness, blurred vision, chest pain, SOB or lower leg edema. Denies excessive caffeine intake, stimulant usage, excessive alcohol intake or increase in salt consumption.  BP Readings from Last 3 Encounters:  05/14/20 (!) 158/90  04/01/20 (!) 146/90  01/02/20 (!) 146/80     Health Maintenance Due  Topic Date Due  . Hepatitis C Screening  Never done    Past Medical History:  Diagnosis Date  . Allergic rhinitis 06/03/2007  . Anxiety   . Arthritis   . 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 HLD (hyperlipidemia)   . Melanoma in situ (HCC)   . Vitamin D deficiency      Social History   Tobacco Use  . Smoking status: Never Smoker  . Smokeless tobacco: Never Used  Vaping Use  . Vaping Use: Never used  Substance Use Topics  . Alcohol use: No  . Drug use: No    Past Surgical History:  Procedure Laterality Date  . BACK SURGERY    . CHOLECYSTECTOMY  2006  . IR GASTROSTOMY TUBE MOD SED  11/17/2016  . IR GASTROSTOMY TUBE REMOVAL  01/18/2017  . S/P Hysterectomy  1990    Family History  Problem Relation Age of Onset  . Stroke Mother   . Hyperlipidemia Mother   . Diabetes Mellitus II Mother   . Hypertension Mother   . Heart disease Mother   . Stroke Father   . Hyperlipidemia Father   . Hypertension Father   . Heart disease Father   . Colon cancer Maternal Aunt   . Hypertension Son   . Anxiety disorder Son     PMHx, SurgHx, SocialHx, FamHx, Medications, and Allergies were reviewed in the Visit Navigator and updated as appropriate.   Patient Active  Problem List   Diagnosis Date Noted  . Osteoporosis without current pathological fracture 07/04/2019  . Anxiety   . Anemia 01/22/2017  . Benign essential HTN   . Dysarthria, post-stroke   . Acute blood loss anemia   . 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 HLD (hyperlipidemia) 06/03/2007  . Allergic rhinitis 06/03/2007  . VAGINITIS, ATROPHIC 06/03/2007    Social History   Tobacco Use  . Smoking status: Never Smoker  . Smokeless tobacco: Never Used  Vaping Use  . Vaping Use: Never used  Substance Use Topics  . Alcohol use: No  . Drug use: No    Current Medications and Allergies:    Current Outpatient Medications:  .  alendronate (FOSAMAX) 70 MG tablet, TAKE 1 TABLET(70 MG) BY MOUTH EVERY 7 DAYS WITH A FULL GLASS OF WATER AND ON AN EMPTY STOMACH, Disp: 4 tablet, Rfl: 11 .  atorvastatin (LIPITOR) 40 MG tablet, TAKE 1 TABLET BY MOUTH DAILY AT 6 PM, Disp: 90 tablet, Rfl: 1 .  clopidogrel (PLAVIX) 75 MG tablet, TAKE 1 TABLET(75 MG) BY MOUTH DAILY, Disp: 90 tablet, Rfl: 3 .  losartan (COZAAR) 50 MG tablet, Take 1 tablet (50 mg total) by mouth daily., Disp: 90 tablet, Rfl:  1 .  sertraline (ZOLOFT) 50 MG tablet, TAKE 1 TABLET(50 MG) BY MOUTH DAILY, Disp: 90 tablet, Rfl: 1   Allergies  Allergen Reactions  . Lisinopril Other (See Comments)    Dizziness and felt odd--cough     Review of Systems   ROS Negative unless otherwise specified per HPI.  Vitals:   Vitals:   05/14/20 1051  BP: (!) 158/90  Pulse: 98  Temp: (!) 97.2 F (36.2 C)  TempSrc: Temporal  SpO2: 96%  Weight: 153 lb 6.1 oz (69.6 kg)  Height: 5' (1.524 m)     Body mass index is 29.95 kg/m.   Physical Exam:    Physical Exam Vitals and nursing note reviewed.  Constitutional:      General: She is not in acute distress.    Appearance: She is well-developed. She is not ill-appearing or toxic-appearing.  Cardiovascular:     Rate and  Rhythm: Normal rate and regular rhythm.     Pulses: Normal pulses.     Heart sounds: Normal heart sounds, S1 normal and S2 normal.     Comments: No LE edema Pulmonary:     Effort: Pulmonary effort is normal.     Breath sounds: Normal breath sounds.  Skin:    General: Skin is warm and dry.  Neurological:     Mental Status: She is alert.     GCS: GCS eye subscore is 4. GCS verbal subscore is 5. GCS motor subscore is 6.  Psychiatric:        Speech: Speech normal.        Behavior: Behavior normal. Behavior is cooperative.      Assessment and Plan:    [name] was seen today for hypertension.  Diagnoses and all orders for this visit:  Benign essential HTN   Above goal Increase Losartan to 75 mg daily Follow-up in 4-6 months for CPE Continue to check blood pressure at least weekly  CMA or LPN served as scribe during this visit. History, Physical, and Plan performed by medical provider. The above documentation has been reviewed and is accurate and complete.   Jarold Motto, PA-C [place] [place] 05/14/2020  Follow-up: No follow-ups on file.

## 2020-05-14 NOTE — Patient Instructions (Signed)
It was great to see you!  Increase Losartan to 75 mg daily.  Let's follow-up in 4-5 months, sooner if you have concerns.  If a referral was placed today, you will be contacted for an appointment. Please note that routine referrals can sometimes take up to 3-4 weeks to process. Please call our office if you haven't heard anything after this time frame.  Take care,  Jarold Motto PA-C

## 2020-09-24 ENCOUNTER — Ambulatory Visit (INDEPENDENT_AMBULATORY_CARE_PROVIDER_SITE_OTHER): Payer: Medicare PPO | Admitting: Physician Assistant

## 2020-09-24 ENCOUNTER — Encounter: Payer: Self-pay | Admitting: Physician Assistant

## 2020-09-24 ENCOUNTER — Other Ambulatory Visit: Payer: Self-pay

## 2020-09-24 VITALS — BP 152/90 | HR 101 | Temp 98.0°F | Wt 151.0 lb

## 2020-09-24 DIAGNOSIS — I10 Essential (primary) hypertension: Secondary | ICD-10-CM

## 2020-09-24 DIAGNOSIS — M810 Age-related osteoporosis without current pathological fracture: Secondary | ICD-10-CM | POA: Diagnosis not present

## 2020-09-24 DIAGNOSIS — I6330 Cerebral infarction due to thrombosis of unspecified cerebral artery: Secondary | ICD-10-CM | POA: Diagnosis not present

## 2020-09-24 DIAGNOSIS — Z85820 Personal history of malignant melanoma of skin: Secondary | ICD-10-CM

## 2020-09-24 DIAGNOSIS — F419 Anxiety disorder, unspecified: Secondary | ICD-10-CM | POA: Diagnosis not present

## 2020-09-24 DIAGNOSIS — Z0000 Encounter for general adult medical examination without abnormal findings: Secondary | ICD-10-CM

## 2020-09-24 DIAGNOSIS — E782 Mixed hyperlipidemia: Secondary | ICD-10-CM

## 2020-09-24 DIAGNOSIS — Z23 Encounter for immunization: Secondary | ICD-10-CM | POA: Diagnosis not present

## 2020-09-24 LAB — COMPREHENSIVE METABOLIC PANEL
ALT: 20 U/L (ref 0–35)
AST: 22 U/L (ref 0–37)
Albumin: 4.2 g/dL (ref 3.5–5.2)
Alkaline Phosphatase: 89 U/L (ref 39–117)
BUN: 22 mg/dL (ref 6–23)
CO2: 27 mEq/L (ref 19–32)
Calcium: 9.7 mg/dL (ref 8.4–10.5)
Chloride: 104 mEq/L (ref 96–112)
Creatinine, Ser: 0.71 mg/dL (ref 0.40–1.20)
GFR: 81.67 mL/min (ref >60.00)
Glucose, Bld: 89 mg/dL (ref 70–99)
Potassium: 4.1 mEq/L (ref 3.5–5.1)
Sodium: 139 mEq/L (ref 135–145)
Total Bilirubin: 0.6 mg/dL (ref 0.2–1.2)
Total Protein: 7.2 g/dL (ref 6.0–8.3)

## 2020-09-24 LAB — CBC WITH DIFFERENTIAL/PLATELET
Basophils Absolute: 0 10*3/uL (ref 0.0–0.1)
Basophils Relative: 0.5 % (ref 0.0–3.0)
Eosinophils Absolute: 0.1 10*3/uL (ref 0.0–0.7)
Eosinophils Relative: 1.8 % (ref 0.0–5.0)
HCT: 40.5 % (ref 36.0–46.0)
Hemoglobin: 13.6 g/dL (ref 12.0–15.0)
Lymphocytes Relative: 19.8 % (ref 12.0–46.0)
Lymphs Abs: 1.2 10*3/uL (ref 0.7–4.0)
MCHC: 33.5 g/dL (ref 30.0–36.0)
MCV: 92 fl (ref 78.0–100.0)
Monocytes Absolute: 0.5 10*3/uL (ref 0.1–1.0)
Monocytes Relative: 8.4 % (ref 3.0–12.0)
Neutro Abs: 4.3 10*3/uL (ref 1.4–7.7)
Neutrophils Relative %: 69.5 % (ref 43.0–77.0)
Platelets: 235 10*3/uL (ref 150.0–400.0)
RBC: 4.41 Mil/uL (ref 3.87–5.11)
RDW: 13.7 % (ref 11.5–15.5)
WBC: 6.1 10*3/uL (ref 4.0–10.5)

## 2020-09-24 LAB — LIPID PANEL
Cholesterol: 126 mg/dL (ref 0–200)
HDL: 47.3 mg/dL (ref >39.00)
LDL Cholesterol: 63 mg/dL (ref 0–99)
NonHDL: 79.05
Total CHOL/HDL Ratio: 3
Triglycerides: 79 mg/dL (ref 0.0–149.0)
VLDL: 15.8 mg/dL (ref 0.0–40.0)

## 2020-09-24 LAB — VITAMIN D 25 HYDROXY (VIT D DEFICIENCY, FRACTURES): VITD: 35.78 ng/mL (ref 30.00–100.00)

## 2020-09-24 MED ORDER — ATORVASTATIN CALCIUM 40 MG PO TABS: 40.0000 mg | ORAL_TABLET | Freq: Every day | ORAL | 1 refills | Status: DC

## 2020-09-24 MED ORDER — SERTRALINE HCL 50 MG PO TABS: ORAL_TABLET | ORAL | 1 refills | Status: DC

## 2020-09-24 MED ORDER — ALENDRONATE SODIUM 70 MG PO TABS: ORAL_TABLET | ORAL | 11 refills | Status: DC

## 2020-09-24 MED ORDER — LOSARTAN POTASSIUM 50 MG PO TABS: 75.0000 mg | ORAL_TABLET | Freq: Every day | ORAL | 1 refills | Status: DC

## 2020-09-24 MED ORDER — CLOPIDOGREL BISULFATE 75 MG PO TABS: ORAL_TABLET | ORAL | 1 refills | Status: DC

## 2020-09-24 NOTE — Progress Notes (Signed)
Subjective:    [name] is a 80 y.o. female and is here for a comprehensive physical exam.   HPI  Health Maintenance Due  Topic Date Due   Hepatitis C Screening  Never done   MAMMOGRAM  12/11/2016   INFLUENZA VACCINE  08/12/2020    Acute Concerns: None  Chronic Issues: HTN  -- Currently taking Losartan 50 mg. At home blood pressure readings are: 120/76, 130/80. Patient denies chest pain, SOB, blurred vision, dizziness, unusual headaches, lower leg swelling. Patient is compliant with medication. Denies excessive caffeine intake, stimulant usage, excessive alcohol intake, or increase in salt consumption.  BP Readings from Last 3 Encounters:  09/24/20 (!) 152/90  05/14/20 (!) 158/90  04/01/20 (!) 146/90   HLD --compliant with atorvastatin 40 mg daily, tolerating well. Denies myalgias.  Anxiety -- takes zoloft 50 mg daily. 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  Prior stroke-- taking plavix as prescribed.  She remains very active and tries to live a healthy lifestyle.  Osteoporosis -- taking fosamax 70 mg weekly, denies concerns for heartburn or other intolerances to medication.  History of melanoma --has not seen her dermatologist for routine skin checks since before COVID.  Denies any new lesions.  Health Maintenance: Immunizations -- UTD Colonoscopy -- N/A Mammogram -- N/A PAP -- N/A Bone Density -- UTD Diet -- eating well balanced overall Sleep habits --denies any significant concerns Exercise -- overall active; doing work-out classes regularly Weight -- Weight: 151 lb (68.5 kg)  Mood --remains overall positive outlook on life Weight history: Wt Readings from Last 10 Encounters:  09/24/20 151 lb (68.5 kg)  05/14/20 153 lb 6.1 oz (69.6 kg)  04/01/20 151 lb (68.5 kg)  01/02/20 153 lb 4 oz (69.5 kg)  07/04/19 146 lb (66.2 kg)  05/19/19 145 lb 12.8 oz (66.1 kg)  04/28/19 144 lb 8 oz (65.5 kg)  10/31/18 145 lb 4 oz (65.9 kg)  10/17/18  150 lb 1.6 oz (68.1 kg)  02/01/18 144 lb 12.8 oz (65.7 kg)   Body mass index is 29.49 kg/m. No LMP recorded. Patient is postmenopausal. Alcohol use:  reports no history of alcohol use. Tobacco use:  Tobacco Use: Low Risk    Smoking Tobacco Use: Never   Smokeless Tobacco Use: Never     Depression screen PHQ 2/9 09/24/2020  Decreased Interest 0  Down, Depressed, Hopeless 0  PHQ - 2 Score 0   UTD with eye doctor  Does not go to the dentist  Other providers/specialists: Patient Care Team: Jarold Motto, [place] as PCP - General (Physician Assistant)    PMHx, SurgHx, SocialHx, Medications, and Allergies were reviewed in the Visit Navigator and updated as appropriate.   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    HLD (hyperlipidemia)    Melanoma in situ (HCC)    Vitamin D deficiency      Past Surgical History:  Procedure Laterality Date   BACK SURGERY     CHOLECYSTECTOMY  2006   IR GASTROSTOMY TUBE MOD SED  11/17/2016   IR GASTROSTOMY TUBE REMOVAL  01/18/2017   S/P Hysterectomy  1990     Family History  Problem Relation Age of Onset   Stroke Mother    Hyperlipidemia Mother    Diabetes Mellitus II Mother    Hypertension Mother    Heart  disease Mother    Stroke Father    Hyperlipidemia Father    Hypertension Father    Heart disease Father    Colon cancer Maternal Aunt    Hypertension Son    Anxiety disorder Son     Social History   Tobacco Use   Smoking status: Never   Smokeless tobacco: Never  Vaping Use   Vaping Use: Never used  Substance Use Topics   Alcohol use: No   Drug use: No    Review of Systems:   Review of Systems  Constitutional:  Negative for chills, fever, malaise/fatigue and weight loss.  HENT:  Negative for hearing loss, sinus pain and sore throat.   Respiratory:  Negative for cough and hemoptysis.   Cardiovascular:  Negative for  chest pain, palpitations, leg swelling and PND.  Gastrointestinal:  Negative for abdominal pain, constipation, diarrhea, heartburn, nausea and vomiting.  Genitourinary:  Negative for dysuria, frequency and urgency.  Musculoskeletal:  Negative for back pain, myalgias and neck pain.  Skin:  Negative for itching and rash.  Neurological:  Negative for dizziness, tingling, seizures and headaches.  Endo/Heme/Allergies:  Negative for polydipsia.  Psychiatric/Behavioral:  Negative for depression and suicidal ideas. The patient is not nervous/anxious.    Objective:   BP (!) 152/90   Pulse (!) 101   Temp 98 F (36.7 C) (Temporal)   Wt 151 lb (68.5 kg)   SpO2 97%   BMI 29.49 kg/m  Body mass index is 29.49 kg/m.   General Appearance:    Alert, cooperative, no distress, appears stated age  Head:    Normocephalic, without obvious abnormality, atraumatic  Eyes:    PERRL, conjunctiva/corneas clear, EOM's intact, fundi    benign, both eyes  Ears:    Normal TM's and external ear canals, both ears  Nose:   Nares normal, septum midline, mucosa normal, no drainage    or sinus tenderness  Throat:   Lips, mucosa, and tongue normal; teeth and gums normal  Neck:   Supple, symmetrical, trachea midline, no adenopathy;    thyroid:  no enlargement/tenderness/nodules; no carotid   bruit or JVD  Back:     Symmetric, no curvature, ROM normal, no CVA tenderness  Lungs:     Clear to auscultation bilaterally, respirations unlabored  Chest Wall:    No tenderness or deformity   Heart:    Regular rate and rhythm, S1 and S2 normal, no murmur, rub or gallop  Breast Exam:  Deferred  Abdomen:     Soft, non-tender, bowel sounds active all four quadrants,    no masses, no organomegaly  Genitalia:  Deferred  Extremities:   Extremities normal, atraumatic, no cyanosis or edema  Pulses:   2+ and symmetric all extremities  Skin:   Skin color, texture, turgor normal, no rashes or lesions  Lymph nodes:   Cervical,  supraclavicular, and axillary nodes normal  Neurologic:   CNII-XII intact, normal strength, sensation and reflexes    throughout    Assessment/Plan:   1. Routine physical examination Today patient counseled on age appropriate routine health concerns for screening and prevention, each reviewed and up to date or declined. Immunizations reviewed and up to date or declined. Labs ordered and reviewed. Risk factors for depression reviewed and negative. Hearing function and visual acuity are intact. ADLs screened and addressed as needed. Functional ability and level of safety reviewed and appropriate. Education, counseling and referrals performed based on assessed risks today. Patient provided with a copy of personalized  plan for preventive services.  2. Benign essential HTN Above goal today in office, however patient reports that she is normotensive at home Continue 75 mg losartan daily Recommend continued measuring her blood pressure, and if blood pressure is consistently greater than 130/90, to call our office Follow-up in 6 months, sooner if concerns  3. Mixed hyperlipidemia Update lipid panel today and adjust Lipitor 40 mg daily as indicated  4. Need for immunization against influenza Completed today  5. Cerebral thrombosis with cerebral infarction Continue to work on adequate control of cholesterol, blood sugars, blood pressure Continue Plavix 75 mg twice daily  6. Anxiety Continue Zoloft 50 mg daily  Tolerating well  7. Osteoporosis without current pathological fracture, unspecified osteoporosis type Continue Fosamax 70 mg weekly Currently up-to-date on DEXA  8. History of melanoma Encourage patient to follow-up with her established dermatologist for regular skin checks due to her history  Patient Counseling: [x]    Nutrition: Stressed importance of moderation in sodium/caffeine intake, saturated fat and cholesterol, caloric balance, sufficient intake of fresh fruits, vegetables,  fiber, calcium, iron, and 1 mg of folate supplement per day (for females capable of pregnancy).  [x]    Stressed the importance of regular exercise.   [x]    Substance Abuse: Discussed cessation/primary prevention of tobacco, alcohol, or other drug use; driving or other dangerous activities under the influence; availability of treatment for abuse.   [x]    Injury prevention: Discussed safety belts, safety helmets, smoke detector, smoking near bedding or upholstery.   [x]    Sexuality: Discussed sexually transmitted diseases, partner selection, use of condoms, avoidance of unintended pregnancy  and contraceptive alternatives.  [x]    Dental health: Discussed importance of regular tooth brushing, flossing, and dental visits.  [x]    Health maintenance and immunizations reviewed. Please refer to Health maintenance section.   , PA-C [facility]

## 2020-09-24 NOTE — Patient Instructions (Addendum)
It was great to see you!  *Keep close tabs on blood pressure -- 130/90 *Please go see your dermatologist  Please go to the lab for blood work.   Our office will call you with your results unless you have chosen to receive results via MyChart.  If your blood work is normal we will follow-up each year for physicals and as scheduled for chronic medical problems.  If anything is abnormal we will treat accordingly and get you in for a follow-up.  Take care,  Lelon Mast

## 2020-11-01 ENCOUNTER — Encounter: Payer: Self-pay | Admitting: Physician Assistant

## 2020-11-01 ENCOUNTER — Other Ambulatory Visit: Payer: Self-pay

## 2020-11-01 ENCOUNTER — Ambulatory Visit (INDEPENDENT_AMBULATORY_CARE_PROVIDER_SITE_OTHER)
Admission: RE | Admit: 2020-11-01 | Discharge: 2020-11-01 | Disposition: A | Discharge status: Home / Self Care | Payer: Medicare PPO | Source: Ambulatory Visit | Attending: Physician Assistant | Admitting: Physician Assistant

## 2020-11-01 ENCOUNTER — Ambulatory Visit: Payer: Medicare PPO | Admitting: Physician Assistant

## 2020-11-01 VITALS — BP 160/90 | HR 104 | Temp 98.2°F | Ht 60.0 in | Wt 150.0 lb

## 2020-11-01 DIAGNOSIS — M25561 Pain in right knee: Secondary | ICD-10-CM | POA: Diagnosis not present

## 2020-11-01 DIAGNOSIS — I10 Essential (primary) hypertension: Secondary | ICD-10-CM

## 2020-11-01 MED ORDER — PREDNISONE 20 MG PO TABS: 40.0000 mg | ORAL_TABLET | Freq: Every day | ORAL | 0 refills | Status: DC

## 2020-11-01 MED ORDER — LOSARTAN POTASSIUM 100 MG PO TABS: 100.0000 mg | ORAL_TABLET | Freq: Every day | ORAL | 0 refills | Status: DC

## 2020-11-01 NOTE — Patient Instructions (Signed)
It was great to see you!  Increase losartan to 100 mg --> FOLLOW-UP IN 4-6 WEEKS TO RECHECK BP WITH ME  An order for an xray has been put in for you. To get your xray, you can walk in at the [facility] location without a scheduled appointment.  The address is [address] It is across the street from [facility]. X-ray is located in the basement.  Hours of operation are M-F 8:30am to 5:00pm. Please note that they are closed for lunch between 12:30 and 1:00pm.  Oral prednisone has been sent in for you Do not take oral ibuprofen while on oral prednisone You may take oral tylenol while on this  I will be in touch with your results of your knee  Voltaren Gel is available over the counter. You are to apply this gel to your injured body part twice daily (morning and evening).   A little goes a long way so you can use about a pea-sized amount for each area.  Spread this small amount over the area into a thin film and let it dry.  Be sure that you do not rub the gel into your skin for more than 10 or 15 seconds otherwise it can irritate you skin.   Once you apply the gel, please do not put any other lotion or clothing in contact with that area for 30 minutes to allow the gel to absorb into your skin.   Some people are sensitive to the medication and can develop a sunburn-like rash.  If you have only mild symptoms it is okay to continue to use the medication but if you have any breakdown of your skin you should discontinue its use and please let us know.      Let's follow-up in 3 months, sooner if you have concerns.  Take care,  Jarold Motto PA-C

## 2020-11-01 NOTE — Progress Notes (Signed)
[name] is a 80 y.o. female here for right leg pain.   History of Present Illness:   Chief Complaint  Patient presents with  . Leg Pain    Pt c/o right knee pain radiating down front of leg x 2 weeks off and on. Sunday was sitting for awhile and when got up had shooting pain down leg. She took Ibprofen 400 mg helped a lot and used heating pad.   Ilamae presented to today's visit with her son.   HPI  Right Knee Pain Ms. Sturgill presents with c/o intermittent  knee pain that radiates down the front of her leg and has been onset for two weeks. Ana reports on Sunday, October 16, she experienced a shooting pain down her leg while standing after remaining seated for an extended amount of time. She has taken ibuprofen 400 mg and used a heating pad which provided relief.   Prior to this she has not had any knee problems but according to her son, she has been weak on her right side following her CVA. She described remember feeling weakness in her knee more than the pain upon walking after being seated. Cloma reports feeling her pain has been worsening. Denies, swelling, warmth, erythema, or bruising.   Hypertension She is currently on losartan 75 mg daily. Denies chest pain, swelling, SOB. BP Readings from Last 3 Encounters:  11/01/20 (!) 160/90  09/24/20 (!) 152/90  05/14/20 (!) 158/90     Past Medical History:  Diagnosis Date  . Allergic rhinitis 06/03/2007  . Anxiety   . Arthritis   . 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 HLD (hyperlipidemia)   . Melanoma in situ (HCC)   . Vitamin D deficiency      Social History   Tobacco Use  . Smoking status: Never  . Smokeless tobacco: Never  Vaping Use  . Vaping Use: Never used  Substance Use Topics  . Alcohol use: No  . Drug use: No    Past Surgical History:  Procedure Laterality Date  . BACK SURGERY    . CHOLECYSTECTOMY  2006  . IR GASTROSTOMY TUBE MOD SED  11/17/2016  . IR GASTROSTOMY TUBE REMOVAL  01/18/2017   . S/P Hysterectomy  1990    Family History  Problem Relation Age of Onset  . Stroke Mother   . Hyperlipidemia Mother   . Diabetes Mellitus II Mother   . Hypertension Mother   . Heart disease Mother   . Stroke Father   . Hyperlipidemia Father   . Hypertension Father   . Heart disease Father   . Colon cancer Maternal Aunt   . Hypertension Son   . Anxiety disorder Son     Allergies  Allergen Reactions  . Lisinopril Other (See Comments)    Dizziness and felt odd--cough     Current Medications:   Current Outpatient Medications:  .  alendronate (FOSAMAX) 70 MG tablet, TAKE 1 TABLET(70 MG) BY MOUTH EVERY 7 DAYS WITH A FULL GLASS OF WATER AND ON AN EMPTY STOMACH, Disp: 4 tablet, Rfl: 11 .  atorvastatin (LIPITOR) 40 MG tablet, Take 1 tablet (40 mg total) by mouth daily., Disp: 90 tablet, Rfl: 1 .  clopidogrel (PLAVIX) 75 MG tablet, TAKE 1 TABLET(75 MG) BY MOUTH DAILY, Disp: 90 tablet, Rfl: 1 .  losartan (COZAAR) 100 MG tablet, Take 1 tablet (100 mg total) by mouth daily., Disp: 90 tablet, Rfl: 0 .  predniSONE (DELTASONE) 20 MG tablet,  Take 2 tablets (40 mg total) by mouth daily., Disp: 10 tablet, Rfl: 0 .  sertraline (ZOLOFT) 50 MG tablet, TAKE 1 TABLET(50 MG) BY MOUTH DAILY, Disp: 90 tablet, Rfl: 1   Review of Systems:   ROS Negative unless otherwise specified per HPI.  Vitals:   Vitals:   11/01/20 1358  BP: (!) 160/90  Pulse: (!) 104  Temp: 98.2 F (36.8 C)  TempSrc: Temporal  SpO2: 96%  Weight: 150 lb (68 kg)  Height: 5' (1.524 m)     Body mass index is 29.29 kg/m.  Physical Exam:   Physical Exam Constitutional:      Appearance: Normal appearance. She is well-developed.  HENT:     Head: Normocephalic and atraumatic.  Eyes:     General: Lids are normal.     Extraocular Movements: Extraocular movements intact.     Conjunctiva/sclera: Conjunctivae normal.  Pulmonary:     Effort: Pulmonary effort is normal.  Musculoskeletal:        General: Normal range  of motion.     Cervical back: Normal range of motion and neck supple.     Comments: R knee with no obvious deformity Pain with resisted extension No calf pain/swelling/redness  Skin:    General: Skin is warm and dry.  Neurological:     Mental Status: She is alert and oriented to person, place, and time.  Psychiatric:        Attention and Perception: Attention and perception normal.        Mood and Affect: Mood normal.        Behavior: Behavior normal.        Thought Content: Thought content normal.        Judgment: Judgment normal.    Assessment and Plan:   Acute pain of right knee Uncontrolled Will obtain xray for further evaluation ACE wrap and compression recommended Oral prednisone 20 mg BID prescribed May trial topical voltaren as well Based on results of xray will consider PT vs sports medicine  Benign essential HTN Uncontrolled Increase losartan to 100 mg daily Follow-up in 4-6 weeks for recheck  I,Havlyn C Ratchford,acting as a scribe for [employer], PA.,have documented all relevant documentation on the behalf of Jarold Motto, PA,as directed by  Jarold Motto, PA while in the presence of Jarold Motto, [place].   I, Jarold Motto, [place], have reviewed all documentation for this visit. The documentation on 11/01/20 for the exam, diagnosis, procedures, and orders are all accurate and complete.   Jarold Motto, PA-C

## 2020-11-04 ENCOUNTER — Other Ambulatory Visit: Payer: Self-pay | Admitting: *Deleted

## 2020-11-04 ENCOUNTER — Other Ambulatory Visit: Payer: Self-pay | Admitting: Physician Assistant

## 2020-11-04 DIAGNOSIS — G8929 Other chronic pain: Secondary | ICD-10-CM

## 2020-11-04 DIAGNOSIS — M1711 Unilateral primary osteoarthritis, right knee: Secondary | ICD-10-CM

## 2020-11-06 NOTE — Progress Notes (Signed)
    Subjective:    CC: R knee pain  I, Molly Weber, LAT, ATC, am serving as scribe for Dr. Clementeen Graham.  HPI: Pt is a 80 y/o female presenting w/ c/o intermittent R knee pain that worsened on 10/27/20 when she experienced radiating pain down her R leg when she was transitioning from sitting to standing.  She was seen by her PCP on 11/01/20 for these c/o and was prescribed prednisone.  Today, she has no pain but prior to starting the prednisone she had pain from her R ant knee to her R ankle.  She is walking w/ a cane.  R knee swelling: No R knee mechanical symptoms: No Aggravating factors: walking; weight-bearing activity Treatments tried: IBU, heat, prednisone,   Diagnostic testing: R knee XR- 11/01/20  Pertinent review of Systems: No fevers or chills  Relevant historical information: History of stroke.  Hypertension.   Objective:    Vitals:   11/07/20 1010  BP: (!) 148/74  Pulse: 81  SpO2: 96%   General: Well Developed, well nourished, and in no acute distress.   MSK: Right knee: Normal-appearing Normal motion with crepitation.   Nontender palpation.  Stable to commence exam.  Intact strength.  Normal gait.    Lab and Radiology Results EXAM: RIGHT KNEE - COMPLETE 4+ VIEW   COMPARISON:  None.   FINDINGS: Mild medial tibiofemoral joint space narrowing trace patellofemoral spurring. No fracture or erosion. No focal bone lesion or bony destruction. Minimal quadriceps tendon enthesophyte. No joint effusion. Vascular calcifications are seen. No other soft tissue abnormality.   IMPRESSION: 1. Minimal osteoarthritis.  No acute bony abnormality. 2. Minimal quadriceps tendon enthesophyte.     Electronically Signed   By: Narda Rutherford M.D.   On: 11/02/2020 01:56 I, Clementeen Graham, personally (independently) visualized and performed the interpretation of the images attached in this note.     Impression and Recommendations:    Assessment and Plan: 80 y.o.  female with right knee pain thought to be due to exacerbation of DJD.  She is had considerable improvement with a course of oral steroids prescribed by PCP.  Plan to continue quad strengthening exercises and Voltaren gel.  If pain should return with the steroids course completes neck step could be intra-articular steroid injection or hyaluronic acid injection.  Recheck back with me as needed.  Stressed the importance of continued ongoing exercise program.  Reviewed precautions and options. Total encounter time 20 minutes including face-to-face time with the patient and, reviewing past medical record, and charting on the date of service.   .   Discussed warning signs or symptoms. Please see discharge instructions. Patient expresses understanding.   The above documentation has been reviewed and is accurate and complete Clementeen Graham, M.D.

## 2020-11-07 ENCOUNTER — Ambulatory Visit: Payer: Medicare PPO | Admitting: Family Medicine

## 2020-11-07 ENCOUNTER — Other Ambulatory Visit: Payer: Self-pay

## 2020-11-07 ENCOUNTER — Encounter: Payer: Self-pay | Admitting: Family Medicine

## 2020-11-07 VITALS — BP 148/74 | HR 81 | Ht 60.0 in | Wt 151.6 lb

## 2020-11-07 DIAGNOSIS — M25561 Pain in right knee: Secondary | ICD-10-CM

## 2020-11-07 NOTE — Patient Instructions (Addendum)
Nice to meet you today.  Resume activity as tolerated.  Follow-up:as needed

## 2020-11-11 ENCOUNTER — Ambulatory Visit: Payer: Medicare PPO | Admitting: Family Medicine

## 2020-11-11 ENCOUNTER — Ambulatory Visit: Payer: Self-pay

## 2020-11-11 ENCOUNTER — Other Ambulatory Visit: Payer: Self-pay

## 2020-11-11 VITALS — BP 138/86 | HR 100 | Ht 60.0 in | Wt 149.4 lb

## 2020-11-11 DIAGNOSIS — M25561 Pain in right knee: Secondary | ICD-10-CM

## 2020-11-11 NOTE — Progress Notes (Signed)
I, Philbert Riser, LAT, ATC acting as a scribe for Clementeen Graham, MD.  [name] is a 80 y.o. female who presents to [facility] today for continued R leg/calf pain. Pt was last seen by Dr. Denyse Amass on 11/07/20 c/o R knee pain w/ radiating pain to R lower leg and R ankle/foot and was advised to cont quad strengthening and Voltaren gel. Today, pt reports no pain in her R knee, but pain on medial-anterior aspect of her lower leg. No swelling. Pt notes tenderness started the day after finishing the prednisone. Pt c/o the R leg feeling like it is going to "give way."  Dx imaging: 11/01/20 R knee XR  03/26/16 Bilat LE vascular US  Pertinent review of systems: No fevers or chills  Relevant historical information: History of stroke.  History of extensive lumbar fusion L2-L5 2018 Dr. Dutch Quint. Osteoporosis  Exam:  BP 138/86   Pulse 100   Ht 5' (1.524 m)   Wt 149 lb 6.4 oz (67.8 kg)   SpO2 98%   BMI 29.18 kg/m  General: Well Developed, well nourished, and in no acute distress.   MSK: Right knee normal-appearing Normal knee motion. Tender palpation medial joint line. Stable ligamentous exam. Mildly positive medial McMurray's test. Significant antalgic gait using a cane to ambulate.    Lab and Radiology Results  Procedure: Real-time Ultrasound Guided Injection of right knee superior lateral patellar space Device: Philips Affiniti 50G Images permanently stored and available for review in PACS Verbal informed consent obtained.  Discussed risks and benefits of procedure. Warned about infection bleeding damage to structures skin hypopigmentation and fat atrophy among others. Patient expresses understanding and agreement Time-out conducted.   Noted no overlying erythema, induration, or other signs of local infection.   Skin prepped in a sterile fashion.   Local anesthesia: Topical Ethyl chloride.   With sterile technique and under real time ultrasound  guidance: 40 mg of Kenalog and 2 mg of lidocaine injected into knee joint. Fluid seen entering the joint capsule.   Completed without difficulty   Pain immediately resolved suggesting accurate placement of the medication.   Advised to call if fevers/chills, erythema, induration, drainage, or persistent bleeding.   Images permanently stored and available for review in the ultrasound unit.  Impression: Technically successful ultrasound guided injection.   EXAM: RIGHT KNEE - COMPLETE 4+ VIEW   COMPARISON:  None.   FINDINGS: Mild medial tibiofemoral joint space narrowing trace patellofemoral spurring. No fracture or erosion. No focal bone lesion or bony destruction. Minimal quadriceps tendon enthesophyte. No joint effusion. Vascular calcifications are seen. No other soft tissue abnormality.   IMPRESSION: 1. Minimal osteoarthritis.  No acute bony abnormality. 2. Minimal quadriceps tendon enthesophyte.     Electronically Signed   By: Narda Rutherford M.D.   On: 11/02/2020 01:56   I, Clementeen Graham, personally (independently) visualized and performed the interpretation of the images attached in this note.       Assessment and Plan: 80 y.o. female with right knee pain versus right medial lower leg pain.  Patient had good improvement with oral steroids however the pain rapidly returned after the oral steroid course ended.  Etiology today is thought to be due to intra-articular cause of knee pain likely worse degenerative changes then as seen on x-ray.  She had great response to steroid injection in clinic with rapid improvement in pain indicating that the pain is most likely due to knee issue however lumbar  radiculopathy at L4 is a possibility.  She has had a decompression and fusion at this level making this problem less likely. Recheck as needed  PDMP not reviewed this encounter. Orders Placed This Encounter  Procedures   [place] LIMITED JOINT SPACE STRUCTURES LOW RIGHT(NO LINKED CHARGES)     Standing Status:   Future    Number of Occurrences:   1    Standing Expiration Date:   05/11/2021    Order Specific Question:   Reason for Exam (SYMPTOM  OR DIAGNOSIS REQUIRED)    Answer:   right knee pain    Order Specific Question:   Preferred imaging location?    Answer:   [facility]   No orders of the defined types were placed in this encounter.    Discussed warning signs or symptoms. Please see discharge instructions. Patient expresses understanding.   The above documentation has been reviewed and is accurate and complete Clementeen Graham, M.D.

## 2020-11-11 NOTE — Patient Instructions (Addendum)
Thank you for coming in today.   You received a steroid injection today in your right knee. Seek immediate medical attention if the joint becomes red, extremely painful, or is oozing fluid.   Recheck back as needed.

## 2021-01-24 ENCOUNTER — Telehealth: Payer: Self-pay | Admitting: Physician Assistant

## 2021-01-24 NOTE — Telephone Encounter (Signed)
Copied from [place] [id number]. Topic: Medicare AWV >> Jan 24, 2021 11:40 AM Harris-Coley, Hannah Beat wrote: Reason for CRM: Left message for patient to schedule Annual Wellness Visit.  Please schedule with Nurse Health Advisor Charlott Rakes, RN at [facility].  Please call [phone] ask for [facility]

## 2021-02-24 ENCOUNTER — Telehealth: Payer: Self-pay | Admitting: Physician Assistant

## 2021-02-24 NOTE — Telephone Encounter (Signed)
Copied from CRM [id number]. Topic: Medicare AWV >> Feb 24, 2021 10:48 AM Harris-Coley, Avon Gully wrote: Reason for CRM: Left message for patient to schedule Annual Wellness Visit.  Please schedule with Nurse Health Advisor Lanier Ensign, RN at [facility].  Please call [phone] ask for [facility]

## 2021-03-31 ENCOUNTER — Other Ambulatory Visit: Payer: Self-pay | Admitting: Physician Assistant

## 2021-03-31 ENCOUNTER — Telehealth: Payer: Self-pay | Admitting: Physician Assistant

## 2021-03-31 NOTE — Telephone Encounter (Signed)
Pt states [facility] pharmacy told her that her medication was denied by our office. I saw that it was sent to them this morning. Please advise ?

## 2021-03-31 NOTE — Telephone Encounter (Signed)
Spoke with patient, notified Rx send to [facility] this morning  ?

## 2021-04-04 ENCOUNTER — Other Ambulatory Visit: Payer: Self-pay | Admitting: Physician Assistant

## 2021-04-10 ENCOUNTER — Ambulatory Visit (INDEPENDENT_AMBULATORY_CARE_PROVIDER_SITE_OTHER): Payer: Medicare Other | Admitting: Physician Assistant

## 2021-04-10 VITALS — BP 160/76 | HR 96 | Temp 98.3°F | Ht 60.0 in | Wt 155.4 lb

## 2021-04-10 DIAGNOSIS — E782 Mixed hyperlipidemia: Secondary | ICD-10-CM | POA: Diagnosis not present

## 2021-04-10 DIAGNOSIS — I6330 Cerebral infarction due to thrombosis of unspecified cerebral artery: Secondary | ICD-10-CM

## 2021-04-10 DIAGNOSIS — I10 Essential (primary) hypertension: Secondary | ICD-10-CM | POA: Diagnosis not present

## 2021-04-10 DIAGNOSIS — Z8673 Personal history of transient ischemic attack (TIA), and cerebral infarction without residual deficits: Secondary | ICD-10-CM

## 2021-04-10 LAB — COMPREHENSIVE METABOLIC PANEL
ALT: 22 U/L (ref 0–35)
AST: 22 U/L (ref 0–37)
Albumin: 4.6 g/dL (ref 3.5–5.2)
Alkaline Phosphatase: 98 U/L (ref 39–117)
BUN: 23 mg/dL (ref 6–23)
CO2: 28 mEq/L (ref 19–32)
Calcium: 9.5 mg/dL (ref 8.4–10.5)
Chloride: 102 mEq/L (ref 96–112)
Creatinine, Ser: 0.69 mg/dL (ref 0.40–1.20)
GFR: 83.04 mL/min (ref >60.00)
Glucose, Bld: 94 mg/dL (ref 70–99)
Potassium: 4.3 mEq/L (ref 3.5–5.1)
Sodium: 139 mEq/L (ref 135–145)
Total Bilirubin: 0.6 mg/dL (ref 0.2–1.2)
Total Protein: 7.4 g/dL (ref 6.0–8.3)

## 2021-04-10 LAB — LIPID PANEL
Cholesterol: 138 mg/dL (ref 0–200)
HDL: 49.1 mg/dL (ref >39.00)
LDL Cholesterol: 63 mg/dL (ref 0–99)
NonHDL: 88.99
Total CHOL/HDL Ratio: 3
Triglycerides: 130 mg/dL (ref 0.0–149.0)
VLDL: 26 mg/dL (ref 0.0–40.0)

## 2021-04-10 LAB — HEMOGLOBIN A1C: Hgb A1c MFr Bld: 6.1 % (ref 4.6–6.5)

## 2021-04-10 MED ORDER — HYDROCHLOROTHIAZIDE 12.5 MG PO TABS: 12.5000 mg | ORAL_TABLET | Freq: Every day | ORAL | 0 refills | Status: DC

## 2021-04-10 NOTE — Progress Notes (Signed)
[name] is a 81 y.o. female here for a follow up of a pre-existing problem. ? ?History of Present Illness:  ? ?Chief Complaint  ?Patient presents with  ? Hypertension  ?  Pt is F/U with Bp. Pt stated that her knee pain has been doing better.  ? ?HTN; hx of CVA ?Currently compliant with taking losartan 100 mg daily with no adverse effects. States at home blood pressure readings are within normal limits with systolic readings staying around 128-130 -- however this was only checked one time.Patient denies chest pain, SOB, blurred vision, dizziness, unusual headaches, lower leg swelling. Denies excessive caffeine intake, stimulant usage, excessive alcohol intake, or increase in salt consumption. ? ?She does have hx of CVA and was on HCTZ prior to stroke. I believe this was discontinued due to use of enteral nutrition at 2018 hospital discharge. ? ?BP Readings from Last 3 Encounters:  ?04/10/21 (!) 160/76  ?11/11/20 138/86  ?11/07/20 (!) 148/74  ? ?HLD  ?[name] is currently compliant with taking lipitor 40 mg daily and plavix 75 mg daily with no complications. She is managing well at this time. Denies CP or SOB.  ? ?Past Medical History:  ?Diagnosis Date  ? Allergic rhinitis 06/03/2007  ? Anxiety   ? Arthritis   ? 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 HLD (hyperlipidemia)   ? Melanoma in situ [facility])   ? Vitamin D deficiency   ? ?  ?Social History  ? ?Tobacco Use  ? Smoking status: Never  ? Smokeless tobacco: Never  ?Vaping Use  ? Vaping Use: Never used  ?Substance Use Topics  ? Alcohol use: No  ? Drug use: No  ? ? ?Past Surgical History:  ?Procedure Laterality Date  ? BACK SURGERY    ? CHOLECYSTECTOMY  2006  ? IR GASTROSTOMY TUBE MOD SED  11/17/2016  ? IR GASTROSTOMY TUBE REMOVAL  01/18/2017  ? S/P Hysterectomy  1990  ? ? ?Family History  ?Problem Relation Age of Onset  ? Stroke Mother   ? Hyperlipidemia Mother   ? Diabetes Mellitus II Mother   ? Hypertension Mother   ? Heart disease Mother   ? Stroke  Father   ? Hyperlipidemia Father   ? Hypertension Father   ? Heart disease Father   ? Colon cancer Maternal Aunt   ? Hypertension Son   ? Anxiety disorder Son   ? ? ?Allergies  ?Allergen Reactions  ? Lisinopril Other (See Comments)  ?  Dizziness and felt odd--cough ?  ? ? ?Current Medications:  ? ?Current Outpatient Medications:  ?  alendronate (FOSAMAX) 70 MG tablet, TAKE 1 TABLET(70 MG) BY MOUTH EVERY 7 DAYS WITH A FULL GLASS OF WATER AND ON AN EMPTY STOMACH, Disp: 4 tablet, Rfl: 11 ?  atorvastatin (LIPITOR) 40 MG tablet, TAKE 1 TABLET(40 MG) BY MOUTH DAILY, Disp: 90 tablet, Rfl: 0 ?  clopidogrel (PLAVIX) 75 MG tablet, TAKE 1 TABLET(75 MG) BY MOUTH DAILY, Disp: 90 tablet, Rfl: 1 ?  losartan (COZAAR) 100 MG tablet, TAKE 1 TABLET(100 MG) BY MOUTH DAILY, Disp: 90 tablet, Rfl: 0 ?  sertraline (ZOLOFT) 50 MG tablet, TAKE 1 TABLET(50 MG) BY MOUTH DAILY, Disp: 90 tablet, Rfl: 1  ? ?Review of Systems:  ? ?ROS ?Negative unless otherwise specified per HPI. ?Vitals:  ? ?Vitals:  ? 04/10/21 1017  ?BP: (!) 160/76  ?Pulse: 96  ?Temp: 98.3 ?F (36.8 ?C)  ?SpO2: 95%  ?Weight: 155 lb 6.4 oz (  70.5 kg)  ?Height: 5' (1.524 m)  ?   ?Body mass index is 30.35 kg/m?. ? ?Physical Exam:  ? ?Physical Exam ?Vitals and nursing note reviewed.  ?Constitutional:   ?   General: She is not in acute distress. ?   Appearance: She is well-developed. She is not ill-appearing or toxic-appearing.  ?Cardiovascular:  ?   Rate and Rhythm: Normal rate and regular rhythm.  ?   Pulses: Normal pulses.  ?   Heart sounds: Normal heart sounds, S1 normal and S2 normal.  ?Pulmonary:  ?   Effort: Pulmonary effort is normal.  ?   Breath sounds: Normal breath sounds.  ?Skin: ?   General: Skin is warm and dry.  ?Neurological:  ?   Mental Status: She is alert.  ?   GCS: GCS eye subscore is 4. GCS verbal subscore is 5. GCS motor subscore is 6.  ?Psychiatric:     ?   Speech: Speech normal.     ?   Behavior: Behavior normal. Behavior is cooperative.  ? ? ?Assessment and  Plan:  ? ?Benign essential HTN ?Uncontrolled ?Continue losartan 100 mg daily  ?Start HCTZ 12.5 mg daily  ?Monitor BP regularly 2-3 times a week, keep a log of readings for future review  ?Encouraged patient to continue participating in healthy eating and daily exercise ?I advised patient that if BP readings are consistently >130/80, to reach out to office and medication will be adjusted ?Follow up in 1 month, sooner if concerns ? ? ?Mixed hyperlipidemia ?Update lipid profile, will adjust medication as indicated by results   ?Continue lipitor 40 mg daily  ? ? ?Cerebral thrombosis with cerebral infarction [facility]) ?No red flags ?Continue plavix 75 mg daily  ?Discussed need for strict control of BP and prevention of DM ? ? ?I,[name],acting as a scribe for [employer], PA.,have documented all relevant documentation on the behalf of Donna Motto, PA,as directed by  Donna Motto, PA while in the presence of [name], [place]. ? ?IJarold Motto, PA, have reviewed all documentation for this visit. The documentation on 04/10/21 for the exam, diagnosis, procedures, and orders are all accurate and complete. ? ? ?Donna Motto, PA-C ? ?

## 2021-04-10 NOTE — Patient Instructions (Signed)
It was great to see you! ? ?Continue losartan 100 mg daily  ?Start hydrochlorothiazide 12.5 mg daily ? ?Check blood pressure 2-3 times a week and record on the blood pressure log for me ? ?Let's follow-up in 1 month, sooner if you have concerns. ? ?Take care, ? ?Jarold Motto PA-C  ?

## 2021-04-28 ENCOUNTER — Other Ambulatory Visit: Payer: Self-pay | Admitting: Physician Assistant

## 2021-05-13 ENCOUNTER — Encounter: Payer: Self-pay | Admitting: Physician Assistant

## 2021-05-13 ENCOUNTER — Ambulatory Visit (INDEPENDENT_AMBULATORY_CARE_PROVIDER_SITE_OTHER): Payer: Medicare Other | Admitting: Physician Assistant

## 2021-05-13 VITALS — BP 120/70 | HR 97 | Temp 97.5°F | Ht 60.0 in | Wt 154.0 lb

## 2021-05-13 DIAGNOSIS — I10 Essential (primary) hypertension: Secondary | ICD-10-CM

## 2021-05-13 NOTE — Progress Notes (Signed)
[name] is a 80 y.o. female here for a follow up of HTN. ? ?History of Present Illness:  ? ?Chief Complaint  ?Patient presents with  ? Hypertension  ?  Pt is here for f/u on blood pressure, checking at home systolic 113-140, diastolic 70-86.  ? ? ?Hypertension ? ?HTN ?Since our previous visit, pt has been compliant with taking losartan 100 mg daily and HCTZ 12.5 mg daily with no complications. At home blood pressure readings are checked weekly with systolic ranging 573-220 and diastolic staying in the 80s. Patient denies chest pain, SOB, blurred vision, dizziness, unusual headaches, lower leg swelling.  Denies excessive caffeine intake, stimulant usage, excessive alcohol intake, or increase in salt consumption. ? ?BP Readings from Last 3 Encounters:  ?05/13/21 120/70  ?04/10/21 (!) 160/76  ?11/11/20 138/86  ? ?At Home BP Log: ?04/04- 132/86 ?04/13- 126/83 ?04/20- 126/82 ?4/29- 140/88 ?4/30- 138/81 ?5/01- 128/81 ?Past Medical History:  ?Diagnosis Date  ? Allergic rhinitis 06/03/2007  ? Anxiety   ? Arthritis   ? 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 HLD (hyperlipidemia)   ? Melanoma in situ [facility])   ? Vitamin D deficiency   ? ?  ?Social History  ? ?Tobacco Use  ? Smoking status: Never  ? Smokeless tobacco: Never  ?Vaping Use  ? Vaping Use: Never used  ?Substance Use Topics  ? Alcohol use: No  ? Drug use: No  ? ? ?Past Surgical History:  ?Procedure Laterality Date  ? BACK SURGERY    ? CHOLECYSTECTOMY  2006  ? IR GASTROSTOMY TUBE MOD SED  11/17/2016  ? IR GASTROSTOMY TUBE REMOVAL  01/18/2017  ? S/P Hysterectomy  1990  ? ? ?Family History  ?Problem Relation Age of Onset  ? Stroke Mother   ? Hyperlipidemia Mother   ? Diabetes Mellitus II Mother   ? Hypertension Mother   ? Heart disease Mother   ? Stroke Father   ? Hyperlipidemia Father   ? Hypertension Father   ? Heart disease Father   ? Colon cancer Maternal Aunt   ? Hypertension Son   ? Anxiety disorder Son   ? ? ?Allergies  ?Allergen Reactions  ?  Lisinopril Other (See Comments)  ?  Dizziness and felt odd--cough ?  ? ? ?Current Medications:  ? ?Current Outpatient Medications:  ?  alendronate (FOSAMAX) 70 MG tablet, TAKE 1 TABLET(70 MG) BY MOUTH EVERY 7 DAYS WITH A FULL GLASS OF WATER AND ON AN EMPTY STOMACH, Disp: 4 tablet, Rfl: 11 ?  atorvastatin (LIPITOR) 40 MG tablet, TAKE 1 TABLET(40 MG) BY MOUTH DAILY, Disp: 90 tablet, Rfl: 0 ?  clopidogrel (PLAVIX) 75 MG tablet, TAKE 1 TABLET(75 MG) BY MOUTH DAILY, Disp: 90 tablet, Rfl: 1 ?  hydrochlorothiazide (HYDRODIURIL) 12.5 MG tablet, Take 1 tablet (12.5 mg total) by mouth daily., Disp: 90 tablet, Rfl: 0 ?  losartan (COZAAR) 100 MG tablet, TAKE 1 TABLET(100 MG) BY MOUTH DAILY, Disp: 90 tablet, Rfl: 0 ?  sertraline (ZOLOFT) 50 MG tablet, TAKE 1 TABLET(50 MG) BY MOUTH DAILY, Disp: 90 tablet, Rfl: 1  ? ?Review of Systems:  ? ?ROS ?Negative unless otherwise specified per HPI. ?Vitals:  ? ?Vitals:  ? 05/13/21 1029  ?BP: 120/70  ?Pulse: 97  ?Temp: (!) 97.5 ?F (36.4 ?C)  ?TempSrc: Temporal  ?SpO2: 97%  ?Weight: 154 lb (69.9 kg)  ?Height: 5' (1.524 m)  ?   ?Body mass index is 30.08 kg/m?. ? ?Physical Exam:  ? ?  Physical Exam ?Vitals and nursing note reviewed.  ?Constitutional:   ?   General: She is not in acute distress. ?   Appearance: She is well-developed. She is not ill-appearing or toxic-appearing.  ?Cardiovascular:  ?   Rate and Rhythm: Normal rate and regular rhythm.  ?   Pulses: Normal pulses.  ?   Heart sounds: Normal heart sounds, S1 normal and S2 normal.  ?Pulmonary:  ?   Effort: Pulmonary effort is normal.  ?   Breath sounds: Normal breath sounds.  ?Skin: ?   General: Skin is warm and dry.  ?Neurological:  ?   Mental Status: She is alert.  ?   GCS: GCS eye subscore is 4. GCS verbal subscore is 5. GCS motor subscore is 6.  ?Psychiatric:     ?   Speech: Speech normal.     ?   Behavior: Behavior normal. Behavior is cooperative.  ? ? ?Assessment and Plan:  ? ?Benign essential HTN ?Controlled ?Continue losartan 100  mg daily and HCTZ 12.5 mg daily ?Monitor BP regularly 1-2 times a week ?Encouraged patient to continue participating in healthy eating and daily exercise ?I advised patient that if BP readings are consistently >130/90, to reach out to office and medication will be adjusted ?Follow up in 6 months, sooner if concerns ? ?I,Havlyn C Ratchford,acting as a scribe for [employer], PA.,have documented all relevant documentation on the behalf of Jarold Motto, PA,as directed by  Jarold Motto, PA while in the presence of Jarold Motto, [place]. ? ?IJarold Motto, PA, have reviewed all documentation for this visit. The documentation on 05/13/21 for the exam, diagnosis, procedures, and orders are all accurate and complete. ? ? ?Jarold Motto, PA-C ? ?

## 2021-05-13 NOTE — Patient Instructions (Signed)
It was great to see you! ? ?Blood pressure is perfect! ? ?Keep up the good work. ? ?Let's follow-up in 6 months, sooner if you have concerns. ? ?Take care, ? ?Inda Coke PA-C  ?

## 2021-06-30 ENCOUNTER — Other Ambulatory Visit: Payer: Self-pay | Admitting: *Deleted

## 2021-06-30 MED ORDER — LOSARTAN POTASSIUM 100 MG PO TABS: ORAL_TABLET | ORAL | 1 refills | Status: DC

## 2021-07-09 ENCOUNTER — Other Ambulatory Visit: Payer: Self-pay | Admitting: *Deleted

## 2021-07-09 MED ORDER — ATORVASTATIN CALCIUM 40 MG PO TABS
ORAL_TABLET | ORAL | 0 refills | Status: DC
Start: 2021-07-09 — End: 2021-09-26

## 2021-07-10 ENCOUNTER — Telehealth: Payer: Self-pay | Admitting: Physician Assistant

## 2021-07-10 MED ORDER — HYDROCHLOROTHIAZIDE 12.5 MG PO TABS: 12.5000 mg | ORAL_TABLET | Freq: Every day | ORAL | 1 refills | Status: DC

## 2021-07-10 NOTE — Telephone Encounter (Signed)
..   Encourage patient to contact the pharmacy for refills or they can request refills through [facility]  LAST APPOINTMENT DATE:  05/13/21  NEXT APPOINTMENT DATE: 11/13/21  MEDICATION: hydrochlorothiazide (HYDRODIURIL) 12.5 MG tablet   Is the patient out of medication? Only has one dose left for tomorrow  PHARMACY: [id number] [place], [place] - [address] AT [facility] [address] [facility] [address]  [address] [place] [address]  Phone:  [phone]  Fax:  [phone]  DEA #:  [id number]  Let patient know to contact pharmacy at the end of the day to make sure medication is ready.  Please notify patient to allow 48-72 hours to process

## 2021-07-10 NOTE — Telephone Encounter (Signed)
Spoke to pt told her Rx for HCTZ was sent to pharmacy. Pt verbalized understanding.

## 2021-08-14 ENCOUNTER — Other Ambulatory Visit: Payer: Self-pay

## 2021-08-14 ENCOUNTER — Emergency Department (HOSPITAL_COMMUNITY)
Admission: EM | Admit: 2021-08-14 | Discharge: 2021-08-14 | Disposition: A | Discharge status: Home / Self Care | Payer: Medicare Other | Attending: Emergency Medicine | Admitting: Emergency Medicine

## 2021-08-14 ENCOUNTER — Encounter (HOSPITAL_COMMUNITY): Payer: Self-pay

## 2021-08-14 ENCOUNTER — Emergency Department (HOSPITAL_COMMUNITY): Payer: Medicare Other

## 2021-08-14 ENCOUNTER — Telehealth: Payer: Self-pay | Admitting: Physician Assistant

## 2021-08-14 DIAGNOSIS — R4182 Altered mental status, unspecified: Secondary | ICD-10-CM | POA: Diagnosis not present

## 2021-08-14 DIAGNOSIS — I10 Essential (primary) hypertension: Secondary | ICD-10-CM | POA: Diagnosis not present

## 2021-08-14 DIAGNOSIS — R5383 Other fatigue: Secondary | ICD-10-CM | POA: Insufficient documentation

## 2021-08-14 DIAGNOSIS — R531 Weakness: Secondary | ICD-10-CM | POA: Diagnosis not present

## 2021-08-14 DIAGNOSIS — R200 Anesthesia of skin: Secondary | ICD-10-CM | POA: Diagnosis not present

## 2021-08-14 DIAGNOSIS — R262 Difficulty in walking, not elsewhere classified: Secondary | ICD-10-CM | POA: Diagnosis not present

## 2021-08-14 LAB — CBC WITH DIFFERENTIAL/PLATELET
Abs Immature Granulocytes: 0.08 10*3/uL — ABNORMAL HIGH (ref 0.00–0.07)
Basophils Absolute: 0 10*3/uL (ref 0.0–0.1)
Basophils Relative: 0 %
Eosinophils Absolute: 0 10*3/uL (ref 0.0–0.5)
Eosinophils Relative: 0 %
HCT: 43.6 % (ref 36.0–46.0)
Hemoglobin: 14.5 g/dL (ref 12.0–15.0)
Immature Granulocytes: 1 %
Lymphocytes Relative: 7 %
Lymphs Abs: 0.8 10*3/uL (ref 0.7–4.0)
MCH: 31.6 pg (ref 26.0–34.0)
MCHC: 33.3 g/dL (ref 30.0–36.0)
MCV: 95 fL (ref 80.0–100.0)
Monocytes Absolute: 0.4 10*3/uL (ref 0.1–1.0)
Monocytes Relative: 4 %
Neutro Abs: 9 10*3/uL — ABNORMAL HIGH (ref 1.7–7.7)
Neutrophils Relative %: 88 %
Platelets: 256 10*3/uL (ref 150–400)
RBC: 4.59 MIL/uL (ref 3.87–5.11)
RDW: 13.1 % (ref 11.5–15.5)
WBC: 10.2 10*3/uL (ref 4.0–10.5)
nRBC: 0 % (ref 0.0–0.2)

## 2021-08-14 LAB — URINALYSIS, ROUTINE W REFLEX MICROSCOPIC
Bilirubin Urine: NEGATIVE
Glucose, UA: NEGATIVE mg/dL
Hgb urine dipstick: NEGATIVE
Ketones, ur: NEGATIVE mg/dL
Nitrite: NEGATIVE
Protein, ur: NEGATIVE mg/dL
Specific Gravity, Urine: 1.014 (ref 1.005–1.030)
pH: 6 (ref 5.0–8.0)

## 2021-08-14 LAB — BASIC METABOLIC PANEL
Anion gap: 10 (ref 5–15)
BUN: 24 mg/dL — ABNORMAL HIGH (ref 8–23)
CO2: 26 mmol/L (ref 22–32)
Calcium: 9.4 mg/dL (ref 8.9–10.3)
Chloride: 104 mmol/L (ref 98–111)
Creatinine, Ser: 0.8 mg/dL (ref 0.44–1.00)
GFR, Estimated: >60 mL/min (ref >60)
Glucose, Bld: 107 mg/dL — ABNORMAL HIGH (ref 70–99)
Potassium: 3.8 mmol/L (ref 3.5–5.1)
Sodium: 140 mmol/L (ref 135–145)

## 2021-08-14 MED ORDER — PREDNISONE 10 MG PO TABS: 10.0000 mg | ORAL_TABLET | Freq: Every day | ORAL | 0 refills | Status: DC

## 2021-08-14 MED ORDER — KETOROLAC TROMETHAMINE 15 MG/ML IJ SOLN
15.0000 mg | Freq: Once | INTRAMUSCULAR | Status: AC
  Administered 2021-08-14: 15 mg via INTRAVENOUS
  Filled 2021-08-14: qty 1

## 2021-08-14 MED ORDER — LACTATED RINGERS IV BOLUS
1000.0000 mL | Freq: Once | INTRAVENOUS | Status: AC
  Administered 2021-08-14: 1000 mL via INTRAVENOUS

## 2021-08-14 MED ORDER — ACETAMINOPHEN 500 MG PO TABS
1000.0000 mg | ORAL_TABLET | Freq: Once | ORAL | Status: AC
  Administered 2021-08-14: 1000 mg via ORAL
  Filled 2021-08-14: qty 2

## 2021-08-14 NOTE — ED Provider Notes (Signed)
[facility]EMERGENCY DEPT Provider Note   CSN: [id number] Arrival date & time: 08/14/21  1608     History Chief Complaint  Patient presents with   Extremity Weakness    HPI [name] is a 81 y.o. female presenting for bilateral lower extremity weakness.  She states that yesterday she went to the gym and had a very active day.  She states that she has been doing overall well at home outside of developing right-sided leg arthritis.  She states that when her arthritis flares, her ability to ambulate becomes more difficult.  She denies any focal numbness or tingling.  Endorses diffuse lower extremity weakness as well as ongoing pain.  She states that she had multiple episodes similar to this in the past 2 months and has been following with her PCP and orthopedic.  She has gotten cortisone injections with substantial symptomatic improvement the past and has also gotten p.o. prednisone with intermittent improvement. Patient does not feel that this is a stroke and she is able to move both extremities.  Her concern is only with walking.  She denies any dizziness or other neurologic symptoms at this time. Patient denies fevers chills nausea vomiting, syncope or shortness of breath..  Patient's recorded medical, surgical, social, medication list and allergies were reviewed in the Snapshot window as part of the initial history.   Review of Systems   Review of Systems  Constitutional:  Negative for chills and fever.  HENT:  Negative for ear pain and sore throat.   Eyes:  Negative for pain and visual disturbance.  Respiratory:  Negative for cough and shortness of breath.   Cardiovascular:  Negative for chest pain and palpitations.  Gastrointestinal:  Negative for abdominal pain and vomiting.  Genitourinary:  Negative for dysuria and hematuria.  Musculoskeletal:  Negative for arthralgias and back pain.  Skin:  Negative for color change and rash.  Neurological:   Positive for weakness. Negative for seizures and syncope.  All other systems reviewed and are negative.   Physical Exam Updated Vital Signs BP (!) 157/93   Pulse (!) 110   Temp 98.5 F (36.9 C) (Oral)   Resp 19   Ht 5' (1.524 m)   Wt 68 kg   SpO2 97%   BMI 29.29 kg/m  Physical Exam Vitals and nursing note reviewed.  Constitutional:      General: She is not in acute distress.    Appearance: She is well-developed.  HENT:     Head: Normocephalic and atraumatic.  Eyes:     Conjunctiva/sclera: Conjunctivae normal.  Cardiovascular:     Rate and Rhythm: Normal rate and regular rhythm.     Heart sounds: No murmur heard. Pulmonary:     Effort: Pulmonary effort is normal. No respiratory distress.     Breath sounds: Normal breath sounds.  Abdominal:     General: There is no distension.     Palpations: Abdomen is soft.     Tenderness: There is no abdominal tenderness. There is no right CVA tenderness or left CVA tenderness.  Musculoskeletal:        General: No swelling or tenderness. Normal range of motion.     Cervical back: Neck supple.  Skin:    General: Skin is warm and dry.  Neurological:     General: No focal deficit present.     Mental Status: She is alert and oriented to person, place, and time. Mental status is at baseline.  Cranial Nerves: No cranial nerve deficit.     Motor: Weakness (Reported bilateral lower extremity weakness.  Notably, patient is 5 out of 5 strength though endorses pain when pushing against resistance.) present.      ED Course/ Medical Decision Making/ A&P    Procedures Procedures   Medications Ordered in ED Medications  lactated ringers bolus 1,000 mL (0 mLs Intravenous Stopped 08/14/21 1825)  ketorolac (TORADOL) 15 MG/ML injection 15 mg (15 mg Intravenous Given 08/14/21 1825)  acetaminophen (TYLENOL) tablet 1,000 mg (1,000 mg Oral Given 08/14/21 1823)    Medical Decision Making:    [name] is a 81 y.o. female who  presented to the ED today with intermittent weakness detailed above.     Additional history discussed with patient's family/caregivers.  Patient's presentation is complicated by their history of hyperlipidemia, hypertension, stroke in the past.  Patient placed on continuous vitals and telemetry monitoring while in ED which was reviewed periodically.   Complete initial physical exam performed, notably the patient  was hemodynamically stable in no acute distress.  She is ambulatory tolerating p.o. intake.  Heart rate has been 101 10.  Extensive chart review reveals multiple prior visits where the resting heart rate is between 90 and 110 and regular clinic visits..       Reviewed and confirmed nursing documentation for past medical history, family history, social history.    Initial Assessment:    With the patient's presentation of nonspecific weakness, most likely diagnosis is deconditioning versus fatigue after exertion yesterday.  She endorses strenuous exercise the day before which was out of her normal. Other diagnoses were considered including (but not limited to) CVA, intracranial hemorrhage. These are considered less likely due to history of present illness and physical exam findings.   This is most consistent with an acute life/limb threatening illness complicated by underlying chronic conditions.  Initial Plan:  CT head to evaluate for IC structural etiology of patient's nonspecific weakness  Screening labs including CBC and Metabolic panel to evaluate for infectious or metabolic etiology of disease.  Urinalysis with reflex culture ordered to evaluate for UTI or relevant urologic/nephrologic pathology.  Objective evaluation as below reviewed   Initial Study Results:   Laboratory  All laboratory results reviewed without evidence of clinically relevant pathology.    EKG EKG was reviewed independently. Rate, rhythm, axis, intervals all examined and without medically relevant  abnormality. ST segments without concerns for elevations.     Radiology  All images reviewed independently. Agree with radiology report at this time.    CT Head Wo Contrast  Final Result      Final Assessment and Plan:   Patient able to ambulate to the bathroom using her rollator without difficulty.  Patient's daughter provides further context to staff.  She stated that the patient has been very scared of falling at home and anytime she feels weak she wants to be evaluated.  She has otherwise been doing well at home, eating well ambulatory tolerating p.o. intake.   On repeat evaluation, patient grossly well-appearing at this time.  Objective evaluation grossly reassuring without evidence of infection.  Patient was able to ambulate multiple times to the restroom without difficulty.  Talked with the patient about her ongoing symptoms.  She does at this point feel that her right lower extremity weakness is more limited by pain and does not feel this is coming from her back.  She denies any episodes of incontinence or other cauda equina analogs. After  administration of medications, she states that she feels symptomatically improved and would like to try a burst of steroids as that is helped her in the past.  Discussed the risk of these medications but ultimately this is reasonable at this time.  Will prescribe short-term burst for likely progressive arthritis and plan for patient to follow-up with her primary care provider and orthopedic provider in the outpatient setting.   Disposition:  Based on the above findings, I believe patient is stable for discharge.    Patient/family educated about specific return precautions for given chief complaint and symptoms.  Patient/family educated about follow-up with PCP.     Patient/family expressed understanding of return precautions and need for follow-up. Patient spoken to regarding all imaging and laboratory results and appropriate follow up for these  results. All education provided in verbal form with additional information in written form. Time was allowed for answering of patient questions. Patient discharged.    Emergency Department Medication Summary:   Medications  lactated ringers bolus 1,000 mL (0 mLs Intravenous Stopped 08/14/21 1825)  ketorolac (TORADOL) 15 MG/ML injection 15 mg (15 mg Intravenous Given 08/14/21 1825)  acetaminophen (TYLENOL) tablet 1,000 mg (1,000 mg Oral Given 08/14/21 1823)         Clinical Impression:  1. Weakness   2. Fatigue, unspecified type         Discharge   Final Clinical Impression(s) / ED Diagnoses Final diagnoses:  Weakness  Fatigue, unspecified type    Rx / DC Orders ED Discharge Orders          Ordered    predniSONE (DELTASONE) 10 MG tablet  Daily with breakfast        08/14/21 2013              Tretha Sciara, MD 08/14/21 2029

## 2021-08-14 NOTE — Telephone Encounter (Signed)
Patient states she suddenly got numbness from her Knees down to her feet and is feeling very weak. Transferred to Triage.

## 2021-08-14 NOTE — ED Triage Notes (Signed)
Pt bib ems for bilateral below the knee weakness and numbness.  Pt states numbness/weakness started around 10am today.  Pt denies falls, trauma or any injury.  Pt's CBG 114.

## 2021-08-14 NOTE — ED Notes (Signed)
Pt ambulatory to bathroom w/ supervision and utilizing a RW. Steady gait w/ walker, no c/o pain.

## 2021-08-14 NOTE — Telephone Encounter (Signed)
Pt is currently in the ED  Patient Name: [name] Gender: Female DOB: 12-Dec-1940 Age: 81 Y 10 M 30 D Return Phone Number: [phone] (Primary), [phone] (Secondary) Address: City/ State/ Zip: [place]  [address] Client [facility] - Administrator, [facility] at [facility] Contact Type Call Who Is Calling Patient / Member / Family / Caregiver Call Type Triage / Clinical Caller Name Carrington Clamp Relationship To Patient Care Giver Return Phone Number [phone] (Primary) Chief Complaint NUMBNESS/TINGLING- sudden on one side of the body or face Reason for Call Symptomatic / Request for Health Information Initial Comment Caller states she has a patient on the phone who has suddenly become numb from her knees down to her feet. Pt is feeling very weak. Pt is 81 years old. Translation No Nurse Assessment Nurse: Debroah Loop, RN, Heather Date/Time (Eastern Time): 08/14/2021 2:08:13 PM Confirm and document reason for call. If symptomatic, describe symptoms. ---caller states she is having numbness from her knees down. Denies pain. Numbness worse in R leg but has some in her L foot. Numbness started today Does the patient have any new or worsening symptoms? ---Yes Will a triage be completed? ---Yes Related visit to physician within the last 2 weeks? ---No Does the PT have any chronic conditions? (i.e. diabetes, asthma, this includes High risk factors for pregnancy, etc.) ---Yes List chronic conditions. ---prior stroke, HTN Is this a behavioral health or substance abuse call? ---No Guidelines Guideline Title Affirmed Question Affirmed Notes Nurse Date/Time (Eastern Time) Neurologic Deficit [1] SEVERE weakness (i.e., unable to walk or barely able to walk, requires support) Debroah Loop, RN, Herbert Seta 08/14/2021 2:10:11 PM Guidelines Guideline Title Affirmed Question Affirmed Notes Nurse Date/Time (Eastern Time) AND [2]  new-onset or worsening Disp. Time Lamount Cohen Time) Disposition Final User 08/14/2021 2:04:03 PM Send to Urgent Elwyn Lade 08/14/2021 2:12:48 PM Call EMS 911 Now Yes Debroah Loop, RN, Heather 08/14/2021 2:19:29 PM 911 Outcome Documentation Debroah Loop, RN, Herbert Seta Reason: unable to reach caller on call back. Caller stated she would call 911. Final Disposition 08/14/2021 2:12:48 PM Call EMS 911 Now Yes Debroah Loop, RN, Sibyl Parr Disagree/Comply Comply Caller Understands Yes PreDisposition Call Doctor Care Advice Given Per Guideline CALL EMS 911 NOW: * Immediate medical attention is needed. You need to hang up and call 911 (or an ambulance). * Triager Discretion: I'll call you back in a few minutes to be sure you were able to reach them. CARE ADVICE given per Neurologic Deficit (Adult) guideline.

## 2021-08-15 NOTE — Telephone Encounter (Signed)
See Triage note 

## 2021-08-18 ENCOUNTER — Ambulatory Visit: Payer: Medicare Other | Admitting: Physician Assistant

## 2021-08-19 ENCOUNTER — Encounter: Payer: Self-pay | Admitting: Physician Assistant

## 2021-08-19 ENCOUNTER — Ambulatory Visit (INDEPENDENT_AMBULATORY_CARE_PROVIDER_SITE_OTHER): Payer: Medicare Other | Admitting: Physician Assistant

## 2021-08-19 VITALS — BP 140/76 | HR 115 | Temp 97.9°F | Ht 60.0 in | Wt 150.5 lb

## 2021-08-19 DIAGNOSIS — I10 Essential (primary) hypertension: Secondary | ICD-10-CM | POA: Diagnosis not present

## 2021-08-19 DIAGNOSIS — M545 Low back pain, unspecified: Secondary | ICD-10-CM

## 2021-08-19 DIAGNOSIS — R29898 Other symptoms and signs involving the musculoskeletal system: Secondary | ICD-10-CM

## 2021-08-19 DIAGNOSIS — E8881 Metabolic syndrome: Secondary | ICD-10-CM

## 2021-08-19 NOTE — Patient Instructions (Signed)
It was great to see you!  Check your blood pressure regularly for [place]  If you fall -->> CALL your family!  I will order MRI someone will be in touch today or tomorrow for this  Follow-up based on results! Keep your appointment with Dr. Jordan Likes.  Take care,  Jarold Motto PA-C

## 2021-08-19 NOTE — Progress Notes (Signed)
[name] is a 81 y.o. female here for a follow up on LE weakness.    History of Present Illness:   Chief Complaint  Patient presents with   LE weakness    Pt says legs feel the same.    HPI  LE Weakness; ED follow-up Patient presented to ED with bilateral LE weakness on 08/14/2021. Patient states she was in her bedroom on Thursday morning when she felt right leg weakness. Mostly located in right shin. She does admit she went to her water aerobics exercises Monday and Wednesday which she usually does. Has had increased feeling of "losing legs". She then went to ED for further evaluation. In the ED, she was able to ambulate several times to the bathroom without any difficulty. She was having trouble with walking due to weakness. Per pt, she was able to move bilateral extremities. She had workup in the ED. Had urinalysis which showed bacteria but no reflex culture obtained. Had Ct scan and EKG in the ED. This was normal. She was given Prednisone 10 mg daily for 5 days  and Toradol 15 mg injection and was discharged home.   Today, she states she still has been dealing with LE weakness. However, her symptoms have been improving. States she has taken her last Prednisone today. She has had felt some "twitches in legs" about few days ago. However, this has resolved. She does admit her legs has been feeling heavy. Feels like she is wearing "socks". Has had some swelling.  No other injury or trauma.   She is s/p Posterior Lumbar Two-Three/Three-Four/Four-Five Interbody and Fusion by Dr. Julio Sicks in May 2018.   HTN Currently taking losartan 100 mg daily and HCTZ 12.5 mg daily with no complications. At home blood pressure readings are: not checked. Has had elevated BP in the ED. Patient denies chest pain, SOB, blurred vision, dizziness, unusual headaches, lower leg swelling. Patient is  compliant with medication. Denies excessive caffeine intake, stimulant usage, excessive alcohol intake, or  increase in salt consumption.  BP Readings from Last 3 Encounters:  08/19/21 (!) 140/76  08/14/21 (!) 143/81  05/13/21 120/70   Insulin Resistance Not on any medications currently.  Denies: hypoglycemic or hyperglycemic episodes or symptoms. Tries to limit sweets as able.  Lab Results  Component Value Date   HGBA1C 6.1 04/10/2021     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    HLD (hyperlipidemia)    Melanoma in situ (HCC)    Vitamin D deficiency      Social History   Tobacco Use   Smoking status: Never   Smokeless tobacco: Never  Vaping Use   Vaping Use: Never used  Substance Use Topics   Alcohol use: No   Drug use: No    Past Surgical History:  Procedure Laterality Date   BACK SURGERY     CHOLECYSTECTOMY  2006   IR GASTROSTOMY TUBE MOD SED  11/17/2016   IR GASTROSTOMY TUBE REMOVAL  01/18/2017   S/P Hysterectomy  1990    Family History  Problem Relation Age of Onset   Stroke Mother    Hyperlipidemia Mother    Diabetes Mellitus II Mother    Hypertension Mother    Heart disease Mother    Stroke Father    Hyperlipidemia Father    Hypertension Father    Heart disease  Father    Colon cancer Maternal Aunt    Hypertension Son    Anxiety disorder Son     Allergies  Allergen Reactions   Lisinopril Other (See Comments)    Dizziness and felt odd--cough     Current Medications:   Current Outpatient Medications:    alendronate (FOSAMAX) 70 MG tablet, TAKE 1 TABLET(70 MG) BY MOUTH EVERY 7 DAYS WITH A FULL GLASS OF WATER AND ON AN EMPTY STOMACH, Disp: 4 tablet, Rfl: 11   atorvastatin (LIPITOR) 40 MG tablet, TAKE 1 TABLET(40 MG) BY MOUTH DAILY, Disp: 90 tablet, Rfl: 0   clopidogrel (PLAVIX) 75 MG tablet, TAKE 1 TABLET(75 MG) BY MOUTH DAILY, Disp: 90 tablet, Rfl: 1   hydrochlorothiazide (HYDRODIURIL) 12.5 MG tablet, Take 1 tablet (12.5 mg total) by mouth daily.,  Disp: 90 tablet, Rfl: 1   losartan (COZAAR) 100 MG tablet, TAKE 1 TABLET(100 MG) BY MOUTH DAILY, Disp: 90 tablet, Rfl: 1   sertraline (ZOLOFT) 50 MG tablet, TAKE 1 TABLET(50 MG) BY MOUTH DAILY, Disp: 90 tablet, Rfl: 1   Review of Systems:   ROS Negative unless otherwise specified per HPI.   Vitals:   Vitals:   08/19/21 1501  BP: (!) 140/76  Pulse: (!) 115  Temp: 97.9 F (36.6 C)  TempSrc: Temporal  SpO2: 95%  Weight: 150 lb 8 oz (68.3 kg)  Height: 5' (1.524 m)     Body mass index is 29.39 kg/m.  Physical Exam:   Physical Exam Vitals and nursing note reviewed.  Constitutional:      General: She is not in acute distress.    Appearance: She is well-developed. She is not ill-appearing or toxic-appearing.  Cardiovascular:     Rate and Rhythm: Normal rate and regular rhythm.     Pulses: Normal pulses.     Heart sounds: Normal heart sounds, S1 normal and S2 normal.  Pulmonary:     Effort: Pulmonary effort is normal.     Breath sounds: Normal breath sounds.  Skin:    General: Skin is warm and dry.  Neurological:     Mental Status: She is alert.     GCS: GCS eye subscore is 4. GCS verbal subscore is 5. GCS motor subscore is 6.     Cranial Nerves: Cranial nerves 2-12 are intact.     Motor: Motor function is intact.     Coordination: Coordination is intact.     Comments: Reduced sensation to top of R foot and lateral lower leg Strength 5/5 bilateral LE  Psychiatric:        Speech: Speech normal.        Behavior: Behavior normal. Behavior is cooperative.     Assessment and Plan:   Benign essential HTN Above goal Recommend close monitoring at home Continue losartan 100 mg daily and HCTZ 12.5 mg  Follow-up in 3 months, sooner if concerns  Weakness of both lower extremities; Acute bilateral low back pain without sciatica Due to decreased sensation in R leg, ongoing sensation of weakness, back and R hip pain and hx of lumbar fusion, will obtain stat MRI for further  evaluation  Recommend PT based on results Update blood work to r/o organic cause of sx If any worsening sx, recommend ER eval She already has f/u appt scheduled with Dr. Jordan Likes  Insulin resistance Update A1c and provide recommendations accordingly   I,Savera Zaman,acting as a scribe for Jarold Motto, PA.,have documented all relevant documentation on the behalf of Jarold Motto, PA,as directed by  Jarold Motto, PA while in the presence of Jarold Motto, [place].   I, Jarold Motto, [place], have reviewed all documentation for this visit. The documentation on 08/19/21 for the exam, diagnosis, procedures, and orders are all accurate and complete.   Jarold Motto, PA-C

## 2021-08-20 ENCOUNTER — Ambulatory Visit (HOSPITAL_COMMUNITY)
Admission: RE | Admit: 2021-08-20 | Discharge: 2021-08-20 | Disposition: A | Discharge status: Home / Self Care | Payer: Medicare Other | Source: Ambulatory Visit | Attending: Physician Assistant | Admitting: Physician Assistant

## 2021-08-20 DIAGNOSIS — R29898 Other symptoms and signs involving the musculoskeletal system: Secondary | ICD-10-CM | POA: Diagnosis not present

## 2021-08-20 DIAGNOSIS — M5126 Other intervertebral disc displacement, lumbar region: Secondary | ICD-10-CM | POA: Diagnosis not present

## 2021-08-20 DIAGNOSIS — I10 Essential (primary) hypertension: Secondary | ICD-10-CM | POA: Diagnosis not present

## 2021-08-20 LAB — URINE CULTURE
MICRO NUMBER:: 13750602
SPECIMEN QUALITY:: ADEQUATE

## 2021-08-20 LAB — URINALYSIS, ROUTINE W REFLEX MICROSCOPIC
Bilirubin Urine: NEGATIVE
Hgb urine dipstick: NEGATIVE
Ketones, ur: NEGATIVE
Nitrite: NEGATIVE
RBC / HPF: NONE SEEN (ref 0–?)
Specific Gravity, Urine: 1.02 (ref 1.000–1.030)
Total Protein, Urine: NEGATIVE
Urine Glucose: NEGATIVE
Urobilinogen, UA: 0.2 (ref 0.0–1.0)
pH: 5.5 (ref 5.0–8.0)

## 2021-08-20 LAB — HEMOGLOBIN A1C: Hgb A1c MFr Bld: 6.1 % (ref 4.6–6.5)

## 2021-08-20 LAB — VITAMIN B12: Vitamin B-12: 164 pg/mL — ABNORMAL LOW (ref 211–911)

## 2021-08-20 LAB — TSH: TSH: 3.29 u[IU]/mL (ref 0.35–5.50)

## 2021-08-21 ENCOUNTER — Other Ambulatory Visit: Payer: Self-pay | Admitting: *Deleted

## 2021-08-21 ENCOUNTER — Telehealth: Payer: Self-pay | Admitting: Physician Assistant

## 2021-08-21 DIAGNOSIS — R29898 Other symptoms and signs involving the musculoskeletal system: Secondary | ICD-10-CM

## 2021-08-21 DIAGNOSIS — M4310 Spondylolisthesis, site unspecified: Secondary | ICD-10-CM

## 2021-08-21 NOTE — Telephone Encounter (Signed)
Patient has called in.  I have read her Sam's response to labs and MRI.  Patient understood. She has scheduled nurse visit for b12 for next week.  Please make sure order is placed. Patient is going to call Dr. Lindalou Hose office to follow up on MRI.

## 2021-08-21 NOTE — Telephone Encounter (Signed)
See result notes. 

## 2021-08-26 ENCOUNTER — Ambulatory Visit (INDEPENDENT_AMBULATORY_CARE_PROVIDER_SITE_OTHER): Payer: Medicare Other

## 2021-08-26 DIAGNOSIS — E538 Deficiency of other specified B group vitamins: Secondary | ICD-10-CM | POA: Diagnosis not present

## 2021-08-26 MED ORDER — CYANOCOBALAMIN 1000 MCG/ML IJ SOLN
1000.0000 ug | Freq: Once | INTRAMUSCULAR | Status: AC
  Administered 2021-08-26: 1000 ug via INTRAMUSCULAR

## 2021-08-26 NOTE — Progress Notes (Signed)
Pt here for B12 injection for [name] Worley. Injection given in right deltoid. Pt tolerated well.   

## 2021-08-27 ENCOUNTER — Ambulatory Visit: Payer: Medicare Other

## 2021-08-28 DIAGNOSIS — M4310 Spondylolisthesis, site unspecified: Secondary | ICD-10-CM | POA: Diagnosis not present

## 2021-09-02 ENCOUNTER — Ambulatory Visit (INDEPENDENT_AMBULATORY_CARE_PROVIDER_SITE_OTHER): Payer: Medicare Other

## 2021-09-02 ENCOUNTER — Other Ambulatory Visit: Payer: Self-pay | Admitting: *Deleted

## 2021-09-02 DIAGNOSIS — E538 Deficiency of other specified B group vitamins: Secondary | ICD-10-CM

## 2021-09-02 MED ORDER — CYANOCOBALAMIN 1000 MCG/ML IJ SOLN
1000.0000 ug | Freq: Once | INTRAMUSCULAR | Status: AC
  Administered 2021-09-02: 1000 ug via INTRAMUSCULAR

## 2021-09-02 MED ORDER — ALENDRONATE SODIUM 70 MG PO TABS: ORAL_TABLET | ORAL | 11 refills | Status: DC

## 2021-09-09 NOTE — Therapy (Unsigned)
OUTPATIENT PHYSICAL THERAPY LOWER EXTREMITY EVALUATION   Patient Name: [name] MRN: [id number] DOB:1940/07/05, 81 y.o., female Today's Date: 09/10/2021   PT End of Session - 09/10/21 0932     Visit Number 1    Number of Visits 16    Date for PT Re-Evaluation 12/03/21    Authorization Type [employer]    PT Start Time 0932    PT Stop Time 1009    PT Time Calculation (min) 37 min             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    HLD (hyperlipidemia)    Melanoma in situ (HCC)    Vitamin D deficiency    Past Surgical History:  Procedure Laterality Date   BACK SURGERY     CHOLECYSTECTOMY  2006   IR GASTROSTOMY TUBE MOD SED  11/17/2016   IR GASTROSTOMY TUBE REMOVAL  01/18/2017   S/P Hysterectomy  1990   Patient Active Problem List   Diagnosis Date Noted   Osteoporosis without current pathological fracture 07/04/2019   Anxiety    Anemia 01/22/2017   Benign essential HTN    Dysarthria, post-stroke    Acute blood loss anemia    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   Degenerative spondylolisthesis 06/01/2016   HLD (hyperlipidemia) 06/03/2007   Allergic rhinitis 06/03/2007   VAGINITIS, ATROPHIC 06/03/2007    PCP: Jarold Motto, PA  REFERRING PROVIDER: Jarold Motto, PA  REFERRING DIAG: [phone] (ICD-10-CM) - Weakness of both lower extremities M43.10 (ICD-10-CM) - Degenerative spondylolisthesis  THERAPY DIAG:  Muscle weakness (generalized) - Plan: PT plan of care cert/re-cert  Difficulty in walking, not elsewhere classified - Plan: PT plan of care cert/re-cert  Rationale for Evaluation and Treatment Rehabilitation  ONSET DATE: 3 weeks ago  SUBJECTIVE:   SUBJECTIVE STATEMENT:  States that she has been having some partial numbness in her feet with the right>left and it is on the top of the foot. States she got up about 3 weeks  ago and she was about to change the bed sheets and her leg seemed to get numb so she went to the ED and they cleared her. States that she followed up with Dr. Dutch Quint and he said everything looks good on the MRI. States he said it could be inflammation causing the numbness. States she has been on prednisone with no change in symptoms. Since all this started the numbness has stayed about the same.  Since all of this started she has started using the rollator, previously using the cane. States she is fearful of driving with her right foot as it is numb.   PERTINENT HISTORY: L2-5 fusion 2018, right knee pain, HTN, 2018 CVA with right sided weakness/reduced sensation in right UE   PAIN:  Are you having pain? Yes: NPRS scale: 10 (numbness)/10 Pain location: top of feet and shins Pain description: numbness, ache Aggravating factors: standing, unsure Relieving factors: rest  PRECAUTIONS: None  WEIGHT BEARING RESTRICTIONS No  FALLS:  Has patient fallen in last 6 months? No  LIVING ENVIRONMENT: Lives with: lives alone Stairs: No Has following equipment at home: Single point cane, Environmental consultant - 2 wheeled, and shower chair  OCCUPATION: not current working  PLOF: Independent with community mobility with device  PATIENT GOALS to get rid of the numbness    OBJECTIVE:   DIAGNOSTIC FINDINGS:  MRI Aug 2023  IMPRESSION: 1. Interval posterior decompression and PLIF from L2-L3 to L4-L5 without residual stenosis. 2. 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 3. Progressive adjacent segment disease at L5-S1 without stenosis.      COGNITION:  Overall cognitive status: Within functional limits for tasks assessed     SENSATION: reduced sensation in right lower leg - entire lower leg to light touch and pain  EDEMA:   Bilateral with pitting edema on left 3+, no pitting edema on right   POSTURE: rounded shoulders,  forward head, and posterior pelvic tilt     LE Measurements Lower Extremity Right 09/10/2021 Left 09/10/2021   A/PROM MMT A/PROM MMT  Hip Flexion  3+  4-  Hip Extension      Hip Abduction      Hip Adduction      Hip Internal rotation      Hip External rotation      Knee Flexion  3+  4-  Knee Extension  3+  4-  Ankle Dorsiflexion  4-  4-  Ankle Plantarflexion      Ankle Inversion      Ankle Eversion       (Blank rows = not tested)  * pain   Legs are dependent - right toe blue/red, cold Legs elevated - increased numbness, pale  Observation - hairless and cold legs Bilaterally below the knee  GAIT: Distance walked: 15 feet Assistive device utilized: rollator Level of assistance: Modified independence Comments: unstable, wide BOS, right foot inverted    TODAY'S TREATMENT: 09/10/2021 Therapeutic Exercise:  Aerobic: Supine: Prone:  Seated: ankle pumps x20  Standing: Neuromuscular Re-education: Manual Therapy: Therapeutic Activity: Self Care: Trigger Point Dry Needling:  Modalities:     PATIENT EDUCATION:  Education details: on current presentation, on HEP, on clinical outcomes score and POC Person educated: Patient Education method: Programmer, multimedia, Demonstration, and Handouts Education comprehension: verbalized understanding  HOME EXERCISE PROGRAM: [id number]  ASSESSMENT:  CLINICAL IMPRESSION: Patient presents to therapy with complaints of numbness primarily in her right leg that started a few weeks ago. Medical workup has ruled out CVA or lumbar involvement. Noted during session that numbness increased with legs elevated and patient presents with cold, hairless legs that are possible symptoms of reduced arterial flow. Sent message to PCP to determine if ABI would be beneficial to rule out arterial issue contributing to current presentation. Additionally, patient presents with balance and strength deficits that would greatly benefit from skilled PT to improve  overall function and QOL.   OBJECTIVE IMPAIRMENTS Abnormal gait, decreased activity tolerance, decreased balance, decreased knowledge of use of DME, difficulty walking, decreased ROM, decreased strength, impaired sensation, postural dysfunction, and pain.   ACTIVITY LIMITATIONS standing, stairs, and transfers  PARTICIPATION LIMITATIONS: driving and community activity  PERSONAL FACTORS Age, Fitness, and 1-2 comorbidities: hx of CVA  are also affecting patient's functional outcome.   REHAB POTENTIAL: Good  CLINICAL DECISION MAKING: Evolving/moderate complexity  EVALUATION COMPLEXITY: Moderate  GOALS: Goals reviewed with patient?  yes  SHORT TERM GOALS:  Patient will be independent in self management strategies to improve quality of life and functional outcomes. Baseline: new program Target date: 10/22/2021 Goal status: INITIAL  2.  Patient will report at least 50% improvement in overall symptoms and/or function to demonstrate improved functional mobility Baseline: 0% Target date: 10/22/2021 Goal status: INITIAL  3.  Patient will be able to walk/stand for at least 15 minutes to improve  ability to make a meal. Baseline: very difficult/unable Target date: 10/22/2021 Goal status: INITIAL     LONG TERM GOALS:  Patient will report at least 75% improvement in overall symptoms and/or function to demonstrate improved functional mobility Baseline: 0% Target date: 12/03/2021 Goal status: INITIAL  2.  Patient will be able to ascend and descend steps with railing and step to gait pattern if needed to improve ability to go up and down stairs. Baseline: unable Target date: 12/03/2021 Goal status: INITIAL  3.  Patient will be able to walk with cane at least 198feet without concerns of falling to improve community mobility. Baseline: unable Target date: 12/03/2021 Goal status: INITIAL   PLAN: PT FREQUENCY: 1-2x/week for total of 16 visits over 12 week certification  PT  DURATION: 12 weeks  PLANNED INTERVENTIONS: Therapeutic exercises, Therapeutic activity, Neuromuscular re-education, Balance training, Gait training, Patient/Family education, Self Care, Joint mobilization, Stair training, Vestibular training, Aquatic Therapy, Dry Needling, Electrical stimulation, Cryotherapy, Moist heat, Ultrasound, Ionotophoresis 4mg /ml Dexamethasone, and Manual therapy  PLAN FOR NEXT SESSION: f/u with ABI, assess balance, LE balance and strengthening   12:05 PM, 09/10/21 09/12/21, DPT Physical Therapy with [facility]

## 2021-09-10 ENCOUNTER — Ambulatory Visit: Payer: Medicare Other

## 2021-09-10 ENCOUNTER — Encounter: Payer: Self-pay | Admitting: Physical Therapy

## 2021-09-10 ENCOUNTER — Ambulatory Visit: Payer: Medicare Other | Admitting: Physical Therapy

## 2021-09-10 DIAGNOSIS — R262 Difficulty in walking, not elsewhere classified: Secondary | ICD-10-CM | POA: Diagnosis not present

## 2021-09-10 DIAGNOSIS — M6281 Muscle weakness (generalized): Secondary | ICD-10-CM | POA: Diagnosis not present

## 2021-09-11 ENCOUNTER — Telehealth: Payer: Self-pay | Admitting: *Deleted

## 2021-09-11 DIAGNOSIS — M79604 Pain in right leg: Secondary | ICD-10-CM

## 2021-09-11 NOTE — Telephone Encounter (Signed)
[name], pt is agreeable for you to order U/S of legs.

## 2021-09-11 NOTE — Progress Notes (Signed)
Pt here for B12 injection for [employer]. Injection given in left deltoid. Pt tolerated well.

## 2021-09-11 NOTE — Telephone Encounter (Signed)
-----   Message from [place], [place] sent at 09/10/2021  5:04 PM EDT ----- Regarding: RE: possible need for ABI Thanks [name] for letting me know. It looks like she had this done 5 years ago and was normal, but I think it is worth repeating.  [name] - please call [name] and let her know that PT has shared concerns with me about possible blood flow issues in her legs. I would like to pursue an ultrasound to evaluate if she is agreeable.   If she is -- just fwd to me and I will order. Thanks! ----- Message ----- From: [name], PT Sent: 09/10/2021  10:02 AM EDT To: Jarold Motto, PA; Sedalia Muta, PT Subject: possible need for ABI                          Hello- I was assessing Miss [name] today and noticed increased numbness in her right leg with her legs elevated. Additionally she has cold, pale lower extremities in elevated position and her legs present with minimal hair.   Given the new onset of numbness I thought it would be good to get an ABI to rule out arterial issues in her legs that may be contributing there numbness. As I am out town tonight I cc'd Photographer for Education administrator.

## 2021-09-11 NOTE — Addendum Note (Signed)
Addended by: Haynes Bast on: 09/11/2021 12:30 PM   Modules accepted: Orders

## 2021-09-11 NOTE — Telephone Encounter (Signed)
Spoke to pt told her [name] PT told [name] that concerned about possible blood flow issues in your legs. [name] wanted to know if you would be agreeable to have an U/S of you legs to evaluate blood flow. Pt said yes, I think that would be a good idea. Told pt okay I will let [name] know and she will place the order and someone will contact you to schedule an appt. Pt verbalized understanding.

## 2021-09-18 ENCOUNTER — Ambulatory Visit (HOSPITAL_COMMUNITY)
Admission: RE | Admit: 2021-09-18 | Discharge: 2021-09-18 | Disposition: A | Discharge status: Home / Self Care | Payer: Medicare Other | Source: Ambulatory Visit | Attending: Physician Assistant | Admitting: Physician Assistant

## 2021-09-18 DIAGNOSIS — M79605 Pain in left leg: Secondary | ICD-10-CM | POA: Insufficient documentation

## 2021-09-18 DIAGNOSIS — M79604 Pain in right leg: Secondary | ICD-10-CM | POA: Insufficient documentation

## 2021-09-18 NOTE — Therapy (Unsigned)
OUTPATIENT PHYSICAL THERAPY TREATMENT NOTE   Patient Name: [name] MRN: [id number] DOB:08-May-1940, 81 y.o., female Today's Date: 09/18/2021  PCP: Jarold Motto, PA REFERRING PROVIDER: Jarold Motto, PA  END OF SESSION:    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    HLD (hyperlipidemia)    Melanoma in situ (HCC)    Vitamin D deficiency    Past Surgical History:  Procedure Laterality Date   BACK SURGERY     CHOLECYSTECTOMY  2006   IR GASTROSTOMY TUBE MOD SED  11/17/2016   IR GASTROSTOMY TUBE REMOVAL  01/18/2017   S/P Hysterectomy  1990   Patient Active Problem List   Diagnosis Date Noted   Osteoporosis without current pathological fracture 07/04/2019   Anxiety    Anemia 01/22/2017   Benign essential HTN    Dysarthria, post-stroke    Acute blood loss anemia    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   Degenerative spondylolisthesis 06/01/2016   HLD (hyperlipidemia) 06/03/2007   Allergic rhinitis 06/03/2007   VAGINITIS, ATROPHIC 06/03/2007    REFERRING DIAG: J00.938 (ICD-10-CM) - Weakness of both lower extremities M43.10 (ICD-10-CM) - Degenerative spondylolisthesis    THERAPY DIAG:  No diagnosis found.  Rationale for Evaluation and Treatment Rehabilitation  PERTINENT HISTORY: L2-5 fusion 2018, right knee pain, HTN, 2018 CVA with right sided weakness/reduced sensation in right UE   PRECAUTIONS: None  SUBJECTIVE:  States that she has been having some partial numbness in her feet with the right>left and it is on the top of the foot. States she got up about 3 weeks ago and she was about to change the bed sheets and her leg seemed to get numb so she went to the ED and they cleared her. States that she followed up with Dr. Dutch Quint and he said everything looks good on the MRI. States he said it could be inflammation causing the numbness. States  she has been on prednisone with no change in symptoms. Since all this started the numbness has stayed about the same.   Since all of this started she has started using the rollator, previously using the cane. States she is fearful of driving with her right foot as it is numb.   PAIN:  Are you having pain? Yes: NPRS scale: 10 (numbness)/10 Pain location: top of feet and shins Pain description: numbness, ache Aggravating factors: standing, unsure Relieving factors: rest  OBJECTIVE:    DIAGNOSTIC FINDINGS:  MRI Aug 2023  IMPRESSION: 1. Interval posterior decompression and PLIF from L2-L3 to L4-L5 without residual stenosis. 2. 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 3. Progressive adjacent segment disease at L5-S1 without stenosis.         COGNITION:           Overall cognitive status: Within functional limits for tasks assessed                          SENSATION: reduced sensation in right lower leg - entire lower leg to light touch and pain   EDEMA:    Bilateral with pitting edema on left 3+, no pitting edema on right     POSTURE: rounded shoulders, forward head, and posterior pelvic tilt  LE Measurements       Lower Extremity Right 09/10/2021 Left 09/10/2021    A/PROM MMT A/PROM MMT  Hip Flexion   3+   4-  Hip Extension          Hip Abduction          Hip Adduction          Hip Internal rotation          Hip External rotation          Knee Flexion   3+   4-  Knee Extension   3+   4-  Ankle Dorsiflexion   4-   4-  Ankle Plantarflexion          Ankle Inversion          Ankle Eversion           (Blank rows = not tested)            * pain     Legs are dependent - right toe blue/red, cold Legs elevated - increased numbness, pale   Observation - hairless and cold legs Bilaterally below the knee   GAIT: Distance walked: 15 feet Assistive device utilized:  rollator Level of assistance: Modified independence Comments: unstable, wide BOS, right foot inverted       TODAY'S TREATMENT: 09/10/2021 Therapeutic Exercise:    Aerobic: Supine: Prone:    Seated: ankle pumps x20    Standing: Neuromuscular Re-education: Manual Therapy: Therapeutic Activity: Self Care: Trigger Point Dry Needling:  Modalities:        PATIENT EDUCATION:  Education details: on current presentation, on HEP, on clinical outcomes score and POC Person educated: Patient Education method: Programmer, multimedia, Demonstration, and Handouts Education comprehension: verbalized understanding   HOME EXERCISE PROGRAM: [id number]   ASSESSMENT:   CLINICAL IMPRESSION: Patient presents to therapy with complaints of numbness primarily in her right leg that started a few weeks ago. Medical workup has ruled out CVA or lumbar involvement. Noted during session that numbness increased with legs elevated and patient presents with cold, hairless legs that are possible symptoms of reduced arterial flow. Sent message to PCP to determine if ABI would be beneficial to rule out arterial issue contributing to current presentation. Additionally, patient presents with balance and strength deficits that would greatly benefit from skilled PT to improve overall function and QOL.     OBJECTIVE IMPAIRMENTS Abnormal gait, decreased activity tolerance, decreased balance, decreased knowledge of use of DME, difficulty walking, decreased ROM, decreased strength, impaired sensation, postural dysfunction, and pain.    ACTIVITY LIMITATIONS standing, stairs, and transfers   PARTICIPATION LIMITATIONS: driving and community activity   PERSONAL FACTORS Age, Fitness, and 1-2 comorbidities: hx of CVA  are also affecting patient's functional outcome.    REHAB POTENTIAL: Good   CLINICAL DECISION MAKING: Evolving/moderate complexity   EVALUATION COMPLEXITY: Moderate   GOALS: Goals reviewed with patient?  yes    SHORT TERM GOALS:   Patient will be independent in self management strategies to improve quality of life and functional outcomes. Baseline: new program Target date: 10/22/2021 Goal status: INITIAL   2.  Patient will report at least 50% improvement in overall symptoms and/or function to demonstrate improved functional mobility Baseline: 0% Target date: 10/22/2021 Goal status: INITIAL   3.  Patient will be able to walk/stand for at least 15 minutes to improve ability to make a meal. Baseline: very difficult/unable Target date: 10/22/2021 Goal status: INITIAL  LONG TERM GOALS:   Patient will report at least 75% improvement in overall symptoms and/or function to demonstrate improved functional mobility Baseline: 0% Target date: 12/03/2021 Goal status: INITIAL   2.  Patient will be able to ascend and descend steps with railing and step to gait pattern if needed to improve ability to go up and down stairs. Baseline: unable Target date: 12/03/2021 Goal status: INITIAL   3.  Patient will be able to walk with cane at least 165feet without concerns of falling to improve community mobility. Baseline: unable Target date: 12/03/2021 Goal status: INITIAL     PLAN: PT FREQUENCY: 1-2x/week for total of 16 visits over 12 week certification   PT DURATION: 12 weeks   PLANNED INTERVENTIONS: Therapeutic exercises, Therapeutic activity, Neuromuscular re-education, Balance training, Gait training, Patient/Family education, Self Care, Joint mobilization, Stair training, Vestibular training, Aquatic Therapy, Dry Needling, Electrical stimulation, Cryotherapy, Moist heat, Ultrasound, Ionotophoresis 4mg /ml Dexamethasone, and Manual therapy   PLAN FOR NEXT SESSION: f/u with ABI, assess balance, LE balance and strengthening       , PT 09/18/2021, 4:06 PM

## 2021-09-19 ENCOUNTER — Other Ambulatory Visit: Payer: Self-pay | Admitting: Physician Assistant

## 2021-09-19 DIAGNOSIS — R6889 Other general symptoms and signs: Secondary | ICD-10-CM

## 2021-09-19 DIAGNOSIS — M79604 Pain in right leg: Secondary | ICD-10-CM

## 2021-09-23 ENCOUNTER — Ambulatory Visit: Payer: Medicare Other | Admitting: Physical Therapy

## 2021-09-23 ENCOUNTER — Encounter: Payer: Self-pay | Admitting: Physical Therapy

## 2021-09-23 ENCOUNTER — Ambulatory Visit (INDEPENDENT_AMBULATORY_CARE_PROVIDER_SITE_OTHER): Payer: Medicare Other

## 2021-09-23 DIAGNOSIS — E538 Deficiency of other specified B group vitamins: Secondary | ICD-10-CM

## 2021-09-23 DIAGNOSIS — M6281 Muscle weakness (generalized): Secondary | ICD-10-CM

## 2021-09-23 DIAGNOSIS — R262 Difficulty in walking, not elsewhere classified: Secondary | ICD-10-CM | POA: Diagnosis not present

## 2021-09-23 MED ORDER — CYANOCOBALAMIN 1000 MCG/ML IJ SOLN: 1000.0000 ug | Freq: Once | INTRAMUSCULAR | 0 refills | Status: DC

## 2021-09-23 MED ORDER — CYANOCOBALAMIN 1000 MCG/ML IJ SOLN
1000.0000 ug | Freq: Once | INTRAMUSCULAR | Status: AC
  Administered 2021-09-23: 1000 ug via INTRAMUSCULAR

## 2021-09-23 NOTE — Progress Notes (Signed)
Patient was given b12 injection in right arm per Jarold Motto, PA. Patient tolerated injection well. TL,CMA

## 2021-09-26 ENCOUNTER — Other Ambulatory Visit: Payer: Self-pay | Admitting: *Deleted

## 2021-09-26 MED ORDER — ATORVASTATIN CALCIUM 40 MG PO TABS: ORAL_TABLET | ORAL | 0 refills | Status: DC

## 2021-09-26 MED ORDER — CLOPIDOGREL BISULFATE 75 MG PO TABS: 75.0000 mg | ORAL_TABLET | Freq: Every day | ORAL | 1 refills | Status: DC

## 2021-09-30 ENCOUNTER — Ambulatory Visit: Payer: Medicare Other | Admitting: Physical Therapy

## 2021-09-30 ENCOUNTER — Encounter: Payer: Self-pay | Admitting: Physical Therapy

## 2021-09-30 DIAGNOSIS — M6281 Muscle weakness (generalized): Secondary | ICD-10-CM

## 2021-09-30 DIAGNOSIS — R262 Difficulty in walking, not elsewhere classified: Secondary | ICD-10-CM | POA: Diagnosis not present

## 2021-09-30 NOTE — Therapy (Signed)
OUTPATIENT PHYSICAL THERAPY TREATMENT NOTE   Patient Name: [name] MRN: [id number] DOB:May 27, 1940, 81 y.o., female Today's Date: 09/30/2021  PCP: Inda Coke, PA REFERRING PROVIDER: Inda Coke, PA  END OF SESSION:   PT End of Session - 09/30/21 1349     Visit Number 3    Number of Visits 16    Date for PT Re-Evaluation 12/03/21    Authorization Type [employer]    PT Start Time 9528    PT Stop Time 1427    PT Time Calculation (min) 38 min    Activity Tolerance Patient tolerated treatment well    Behavior During Therapy WFL for tasks assessed/performed             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    HLD (hyperlipidemia)    Melanoma in situ ([place])    Vitamin D deficiency    Past Surgical History:  Procedure Laterality Date   BACK SURGERY     CHOLECYSTECTOMY  2006   IR GASTROSTOMY TUBE MOD SED  11/17/2016   IR GASTROSTOMY TUBE REMOVAL  01/18/2017   S/P Hysterectomy  1990   Patient Active Problem List   Diagnosis Date Noted   Osteoporosis without current pathological fracture 07/04/2019   Anxiety    Anemia 01/22/2017   Benign essential HTN    Dysarthria, post-stroke    Acute blood loss anemia    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   Degenerative spondylolisthesis 06/01/2016   HLD (hyperlipidemia) 06/03/2007   Allergic rhinitis 06/03/2007   VAGINITIS, ATROPHIC 06/03/2007    REFERRING DIAG: R29.898 (ICD-10-CM) - Weakness of both lower extremities M43.10 (ICD-10-CM) - Degenerative spondylolisthesis    THERAPY DIAG:  Muscle weakness (generalized)  Difficulty in walking, not elsewhere classified  Rationale for Evaluation and Treatment Rehabilitation  PERTINENT HISTORY: L2-5 fusion 2018, right knee pain, HTN, 2018 CVA with right sided weakness/reduced sensation in right UE   PRECAUTIONS: None  SUBJECTIVE:   09/30/2021 States she sees the vascular specialist Thursday. Reports her knee was bothering her yesterday.   Eval:States that she has been having some partial numbness in her feet with the right>left and it is on the top of the foot. States she got up about 3 weeks ago and she was about to change the bed sheets and her leg seemed to get numb so she went to the ED and they cleared her. States that she followed up with Dr. Trenton Gammon and he said everything looks good on the MRI. States he said it could be inflammation causing the numbness. States she has been on prednisone with no change in symptoms. Since all this started the numbness has stayed about the same.   Since all of this started she has started using the rollator, previously using the cane. States she is fearful of driving with her right foot as it is numb.   PAIN:  Are you having pain? Yes: NPRS scale: 10 (numbness)/10 Pain location: top of feet and shins Pain description: numbness, ache Aggravating factors: standing, unsure Relieving factors: rest  OBJECTIVE:    DIAGNOSTIC FINDINGS:  MRI Aug 2023  IMPRESSION: 1. Interval posterior decompression and PLIF from L2-L3 to L4-L5 without residual stenosis. 2. 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 3. Progressive adjacent segment disease at L5-S1 without stenosis.  COGNITION:           Overall cognitive status: Within functional limits for tasks assessed                          SENSATION: reduced sensation in right lower leg - entire lower leg to light touch and pain   EDEMA:    Bilateral with pitting edema on left 3+, no pitting edema on right     POSTURE: rounded shoulders, forward head, and posterior pelvic tilt                  LE Measurements       Lower Extremity Right 09/10/2021 Left 09/10/2021    A/PROM MMT A/PROM MMT  Hip Flexion   3+   4-  Hip Extension          Hip  Abduction          Hip Adduction          Hip Internal rotation          Hip External rotation          Knee Flexion   3+   4-  Knee Extension   3+   4-  Ankle Dorsiflexion   4-   4-  Ankle Plantarflexion          Ankle Inversion          Ankle Eversion           (Blank rows = not tested)            * pain     Legs are dependent - right toe blue/red, cold Legs elevated - increased numbness, pale   Observation - hairless and cold legs Bilaterally below the knee   GAIT: Distance walked: 15 feet Assistive device utilized: rollator Level of assistance: Modified independence Comments: unstable, wide BOS, right foot inverted       TODAY'S TREATMENT: 09/30/2021 Seated: ankle pumps x30 B, bike  6 minutes L1, LAQS 3x10, hip abd blue band 3x10 B, marching 3x10 B, STS with fwd reaches x25       PATIENT EDUCATION:  Education details: on HEP Person educated: Patient Education method: Explanation, Facilities manager, and Handouts Education comprehension: verbalized understanding   HOME EXERCISE PROGRAM: [id number]   ASSESSMENT:   CLINICAL IMPRESSION:  09/30/2021    Focused on strengthening and mobility. Answered questions about upcoming vascular apt. Overall patient is doing well. Verbal and tactile cues to improve right LE use during sit to stand as she favors the left LE. Will continue with current POC.       OBJECTIVE IMPAIRMENTS Abnormal gait, decreased activity tolerance, decreased balance, decreased knowledge of use of DME, difficulty walking, decreased ROM, decreased strength, impaired sensation, postural dysfunction, and pain.    ACTIVITY LIMITATIONS standing, stairs, and transfers   PARTICIPATION LIMITATIONS: driving and community activity   PERSONAL FACTORS Age, Fitness, and 1-2 comorbidities: hx of CVA  are also affecting patient's functional outcome.    REHAB POTENTIAL: Good   CLINICAL DECISION MAKING: Evolving/moderate complexity   EVALUATION COMPLEXITY:  Moderate   GOALS: Goals reviewed with patient?  yes   SHORT TERM GOALS:   Patient will be independent in self management strategies to improve quality of life and functional outcomes. Baseline: new program Target date: 10/22/2021 Goal status: INITIAL   2.  Patient will report at least 50% improvement in overall symptoms and/or function to demonstrate improved functional mobility Baseline:  0% Target date: 10/22/2021 Goal status: INITIAL   3.  Patient will be able to walk/stand for at least 15 minutes to improve ability to make a meal. Baseline: very difficult/unable Target date: 10/22/2021 Goal status: INITIAL         LONG TERM GOALS:   Patient will report at least 75% improvement in overall symptoms and/or function to demonstrate improved functional mobility Baseline: 0% Target date: 12/03/2021 Goal status: INITIAL   2.  Patient will be able to ascend and descend steps with railing and step to gait pattern if needed to improve ability to go up and down stairs. Baseline: unable Target date: 12/03/2021 Goal status: INITIAL   3.  Patient will be able to walk with cane at least 172feet without concerns of falling to improve community mobility. Baseline: unable Target date: 12/03/2021 Goal status: INITIAL     PLAN: PT FREQUENCY: 1-2x/week for total of 16 visits over 12 week certification   PT DURATION: 12 weeks   PLANNED INTERVENTIONS: Therapeutic exercises, Therapeutic activity, Neuromuscular re-education, Balance training, Gait training, Patient/Family education, Self Care, Joint mobilization, Stair training, Vestibular training, Aquatic Therapy, Dry Needling, Electrical stimulation, Cryotherapy, Moist heat, Ultrasound, Ionotophoresis 4mg /ml Dexamethasone, and Manual therapy   PLAN FOR NEXT SESSION: f/u with ABI, assess balance, LE balance and strengthening       2:32 PM, 09/30/21 10/02/21, DPT Physical Therapy with Tere[facility] Coop

## 2021-10-02 ENCOUNTER — Ambulatory Visit: Payer: Medicare Other | Admitting: Vascular Surgery

## 2021-10-02 ENCOUNTER — Encounter: Payer: Self-pay | Admitting: Vascular Surgery

## 2021-10-02 VITALS — BP 148/87 | HR 108 | Temp 98.4°F | Resp 20 | Ht 60.0 in | Wt 150.0 lb

## 2021-10-02 DIAGNOSIS — I739 Peripheral vascular disease, unspecified: Secondary | ICD-10-CM | POA: Diagnosis not present

## 2021-10-02 NOTE — Progress Notes (Signed)
ASSESSMENT & PLAN   PARESTHESIAS RIGHT FOOT: I do not think the patient's paresthesias in the right foot can be attributed to peripheral arterial disease.  She has palpable pedal pulses and a normal Doppler study on 09/18/2021.  Likewise I do not think her mild venous disease would explain her symptoms.  She is being treated for B12 deficiency and is getting injections.  Certainly this could be contributing.  She also may have some mild degenerative disc disease of her back is followed by Dr. Trenton Gammon.  In addition, she fell recently just before she began experiencing the symptoms that certainly could be related to this.  In addition she had a previous stroke associated with right-sided weakness and there could be some residual symptoms from this.  No further vascular work-up is indicated at this time.  However I be happy to see her back at any time if any new vascular issues arise.  REASON FOR CONSULT:    Abnormal ABIs.  The consult is requested by Inda Coke, PA.  HPI:   [name] is a 81 y.o. female who noted the fairly sudden onset of tingling in her right foot and numbness in the right foot in August.  On careful questioning this did occur shortly after she had a fall and had been having some knee pain.  She denies any significant back pain.  However she has been evaluated by Dr. Trenton Gammon and has a follow-up visit scheduled with him.  He did not see any major issues with her back.  She is also had a previous stroke associate with right-sided weakness back in 2018.  I do not get any history of claudication, rest pain, or nonhealing ulcers.  Her risk factors for peripheral arterial disease include hypertension and hypercholesterolemia.  She denies any history of diabetes, family history of premature cardiovascular disease, or tobacco use.  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     HLD (hyperlipidemia)    Melanoma in situ ([place])    Vitamin D deficiency     Family History  Problem Relation Age of Onset   Stroke Mother    Hyperlipidemia Mother    Diabetes Mellitus II Mother    Hypertension Mother    Heart disease Mother    Stroke Father    Hyperlipidemia Father    Hypertension Father    Heart disease Father    Colon cancer Maternal Aunt    Hypertension Son    Anxiety disorder Son     SOCIAL HISTORY: Social History   Tobacco Use   Smoking status: Never   Smokeless tobacco: Never  Substance Use Topics   Alcohol use: No    Allergies  Allergen Reactions   Lisinopril Other (See Comments)    Dizziness and felt odd--cough     Current Outpatient Medications  Medication Sig Dispense Refill   alendronate (FOSAMAX) 70 MG tablet TAKE 1 TABLET(70 MG) BY MOUTH EVERY 7 DAYS WITH A FULL GLASS OF WATER AND ON AN EMPTY STOMACH 4 tablet 11   atorvastatin (LIPITOR) 40 MG tablet TAKE 1 TABLET(40 MG) BY MOUTH DAILY 90 tablet 0   clopidogrel (PLAVIX) 75 MG tablet Take 1 tablet (75 mg total) by mouth daily. 90 tablet 1   hydrochlorothiazide (HYDRODIURIL) 12.5 MG tablet Take 1 tablet (12.5 mg total) by mouth daily. 90 tablet 1   losartan (  COZAAR) 100 MG tablet TAKE 1 TABLET(100 MG) BY MOUTH DAILY 90 tablet 1   sertraline (ZOLOFT) 50 MG tablet TAKE 1 TABLET(50 MG) BY MOUTH DAILY 90 tablet 1   No current facility-administered medications for this visit.    REVIEW OF SYSTEMS:  [X]  denotes positive finding, [ ]  denotes negative finding Cardiac  Comments:  Chest pain or chest pressure:    Shortness of breath upon exertion:    Short of breath when lying flat:    Irregular heart rhythm:        Vascular    Pain in calf, thigh, or hip brought on by ambulation:    Pain in feet at night that wakes you up from your sleep:     Blood clot in your veins:    Leg swelling:         Pulmonary    Oxygen at home:    Productive cough:     Wheezing:         Neurologic     Sudden weakness in arms or legs:     Sudden numbness in arms or legs:     Sudden onset of difficulty speaking or slurred speech:    Temporary loss of vision in one eye:     Problems with dizziness:         Gastrointestinal    Blood in stool:     Vomited blood:         Genitourinary    Burning when urinating:     Blood in urine:        Psychiatric    Major depression:         Hematologic    Bleeding problems:    Problems with blood clotting too easily:        Skin    Rashes or ulcers:        Constitutional    Fever or chills:    -  PHYSICAL EXAM:   Vitals:   10/02/21 1111  BP: (!) 148/87  Pulse: (!) 108  Resp: 20  Temp: 98.4 F (36.9 C)  SpO2: 94%  Weight: 150 lb (68 kg)  Height: 5' (1.524 m)   Body mass index is 29.29 kg/m. GENERAL: The patient is a well-nourished female, in no acute distress. The vital signs are documented above. CARDIAC: There is a regular rate and rhythm.  VASCULAR: I do not detect carotid bruits. She has palpable femoral, popliteal, dorsalis pedis, and posterior tibial pulses bilaterally. She has no significant lower extremity swelling. She has no evidence of atheroembolic disease to her toes. PULMONARY: There is good air exchange bilaterally without wheezing or rales. ABDOMEN: Soft and non-tender with normal pitched bowel sounds.  I do not palpate any aneurysm. MUSCULOSKELETAL: There are no major deformities. NEUROLOGIC: No focal weakness or paresthesias are detected. SKIN: There are no ulcers or rashes noted. PSYCHIATRIC: The patient has a normal affect.  DATA:    ARTERIAL DOPPLER STUDY: I have reviewed the arterial Doppler study that was done on 09/18/2021.  On the right side there was a triphasic posterior tibial signal with a biphasic dorsalis pedis signal.  ABI was 100%.  Toe pressure was 93 mmHg.  On the left side there was a biphasic posterior tibial signal with a triphasic dorsalis pedis signal.  ABI was 100%.  Toe pressure  was 104 mmHg.  10/04/21 [facility]

## 2021-10-06 ENCOUNTER — Encounter: Payer: Self-pay | Admitting: *Deleted

## 2021-10-07 ENCOUNTER — Ambulatory Visit (INDEPENDENT_AMBULATORY_CARE_PROVIDER_SITE_OTHER): Payer: Medicare Other

## 2021-10-07 ENCOUNTER — Encounter: Payer: Self-pay | Admitting: Physical Therapy

## 2021-10-07 ENCOUNTER — Ambulatory Visit: Payer: Medicare Other | Admitting: Physical Therapy

## 2021-10-07 DIAGNOSIS — M6281 Muscle weakness (generalized): Secondary | ICD-10-CM

## 2021-10-07 DIAGNOSIS — R262 Difficulty in walking, not elsewhere classified: Secondary | ICD-10-CM | POA: Diagnosis not present

## 2021-10-07 DIAGNOSIS — Z23 Encounter for immunization: Secondary | ICD-10-CM

## 2021-10-07 NOTE — Therapy (Signed)
OUTPATIENT PHYSICAL THERAPY TREATMENT NOTE   Patient Name: [name] MRN: [id number] DOB:1940-08-08, 81 y.o., female Today's Date: 10/07/2021  PCP: Jarold Motto, PA REFERRING PROVIDER: Jarold Motto, PA  END OF SESSION:   PT End of Session - 10/07/21 1303     Visit Number 4    Number of Visits 16    Date for PT Re-Evaluation 12/03/21    Authorization Type BCBS    PT Start Time 1304    PT Stop Time 1344    PT Time Calculation (min) 40 min    Activity Tolerance Patient tolerated treatment well    Behavior During Therapy [facility] for tasks assessed/performed             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    HLD (hyperlipidemia)    Melanoma in situ (HCC)    Vitamin D deficiency    Past Surgical History:  Procedure Laterality Date   BACK SURGERY     CHOLECYSTECTOMY  2006   IR GASTROSTOMY TUBE MOD SED  11/17/2016   IR GASTROSTOMY TUBE REMOVAL  01/18/2017   S/P Hysterectomy  1990   Patient Active Problem List   Diagnosis Date Noted   Osteoporosis without current pathological fracture 07/04/2019   Anxiety    Anemia 01/22/2017   Benign essential HTN    Dysarthria, post-stroke    Acute blood loss anemia    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   Degenerative spondylolisthesis 06/01/2016   HLD (hyperlipidemia) 06/03/2007   Allergic rhinitis 06/03/2007   VAGINITIS, ATROPHIC 06/03/2007    REFERRING DIAG: R29.898 (ICD-10-CM) - Weakness of both lower extremities M43.10 (ICD-10-CM) - Degenerative spondylolisthesis    THERAPY DIAG:  Muscle weakness (generalized)  Difficulty in walking, not elsewhere classified  Rationale for Evaluation and Treatment Rehabilitation  PERTINENT HISTORY: L2-5 fusion 2018, right knee pain, HTN, 2018 CVA with right sided weakness/reduced sensation in right UE   PRECAUTIONS: None  SUBJECTIVE:   10/07/2021 States that in August of this years she fell when the car moved before she got into it. States she hit her left knee knee and arm but unsure of where her right leg went. States about a week later she started having some numbness in her right leg. States she doesn't know what to do next in regards to her numbness.   Eval:States that she has been having some partial numbness in her feet with the right>left and it is on the top of the foot. States she got up about 3 weeks ago and she was about to change the bed sheets and her leg seemed to get numb so she went to the ED and they cleared her. States that she followed up with Dr. Dutch Quint and he said everything looks good on the MRI. States he said it could be inflammation causing the numbness. States she has been on prednisone with no change in symptoms. Since all this started the numbness has stayed about the same.   Since all of this started she has started using the rollator, previously using the cane. States she is fearful of driving with her right foot as it is numb.   PAIN:  Are you having pain? Yes: NPRS scale: 10 (numbness)/10 Pain location: top of feet and shins Pain description: numbness, ache Aggravating factors: standing, unsure Relieving factors: rest  OBJECTIVE:    DIAGNOSTIC  FINDINGS:  MRI Aug 2023  IMPRESSION: 1. Interval posterior decompression and PLIF from L2-L3 to L4-L5 without residual stenosis. 2. 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 3. Progressive adjacent segment disease at L5-S1 without stenosis.         COGNITION:           Overall cognitive status: Within functional limits for tasks assessed                          SENSATION: reduced sensation in right lower leg - entire lower leg to light touch and pain   EDEMA:    Bilateral with pitting edema on left 3+, no pitting edema on right     POSTURE: rounded shoulders,  forward head, and posterior pelvic tilt                  LE Measurements       Lower Extremity Right 09/10/2021 Left 09/10/2021    A/PROM MMT A/PROM MMT  Hip Flexion   3+   4-  Hip Extension          Hip Abduction          Hip Adduction          Hip Internal rotation          Hip External rotation          Knee Flexion   3+   4-  Knee Extension   3+   4-  Ankle Dorsiflexion   4-   4-  Ankle Plantarflexion          Ankle Inversion          Ankle Eversion           (Blank rows = not tested)            * pain     Legs are dependent - right toe blue/red, cold Legs elevated - increased numbness, pale   Observation - hairless and cold legs Bilaterally below the knee   GAIT: Distance walked: 15 feet Assistive device utilized: rollator Level of assistance: Modified independence Comments: unstable, wide BOS, right foot inverted       TODAY'S TREATMENT: 10/07/2021 Seated:STS with weight shift forward 10 minutes total Standing: walking with walker - with heel toe strike  Neuro: vibration to B LE for improved proprioceptive awareness - 15 minutes total       PATIENT EDUCATION:  Education details: on HEP, on vibration, improved proprioceptive awareness, on current condition, following up with PCP about numbness Person educated: Patient Education method: Explanation, Demonstration, and Handouts Education comprehension: verbalized understanding   HOME EXERCISE PROGRAM: [id number]   ASSESSMENT:   CLINICAL IMPRESSION:  10/07/2021  Trailed percussion for vibration on feet today. Educated patient in rationale for this intervention. Improved feeling in toes noted afterwards. Continued with strengthening and discussing following up with PCP if neuropathy still concerning. Will continue with current POC as tolerated.        OBJECTIVE IMPAIRMENTS Abnormal gait, decreased activity tolerance, decreased balance, decreased knowledge of use of DME, difficulty walking, decreased ROM,  decreased strength, impaired sensation, postural dysfunction, and pain.    ACTIVITY LIMITATIONS standing, stairs, and transfers   PARTICIPATION LIMITATIONS: driving and community activity   PERSONAL FACTORS Age, Fitness, and 1-2 comorbidities: hx of CVA  are also affecting patient's functional outcome.    REHAB POTENTIAL: Good   CLINICAL  DECISION MAKING: Evolving/moderate complexity   EVALUATION COMPLEXITY: Moderate   GOALS: Goals reviewed with patient?  yes   SHORT TERM GOALS:   Patient will be independent in self management strategies to improve quality of life and functional outcomes. Baseline: new program Target date: 10/22/2021 Goal status: INITIAL   2.  Patient will report at least 50% improvement in overall symptoms and/or function to demonstrate improved functional mobility Baseline: 0% Target date: 10/22/2021 Goal status: INITIAL   3.  Patient will be able to walk/stand for at least 15 minutes to improve ability to make a meal. Baseline: very difficult/unable Target date: 10/22/2021 Goal status: INITIAL         LONG TERM GOALS:   Patient will report at least 75% improvement in overall symptoms and/or function to demonstrate improved functional mobility Baseline: 0% Target date: 12/03/2021 Goal status: INITIAL   2.  Patient will be able to ascend and descend steps with railing and step to gait pattern if needed to improve ability to go up and down stairs. Baseline: unable Target date: 12/03/2021 Goal status: INITIAL   3.  Patient will be able to walk with cane at least 134feet without concerns of falling to improve community mobility. Baseline: unable Target date: 12/03/2021 Goal status: INITIAL     PLAN: PT FREQUENCY: 1-2x/week for total of 16 visits over 12 week certification   PT DURATION: 12 weeks   PLANNED INTERVENTIONS: Therapeutic exercises, Therapeutic activity, Neuromuscular re-education, Balance training, Gait training, Patient/Family  education, Self Care, Joint mobilization, Stair training, Vestibular training, Aquatic Therapy, Dry Needling, Electrical stimulation, Cryotherapy, Moist heat, Ultrasound, Ionotophoresis 4mg /ml Dexamethasone, and Manual therapy   PLAN FOR NEXT SESSION: f/u with ABI, assess balance, LE balance and strengthening       2:09 PM, 10/07/21 Jerene Pitch, DPT Physical Therapy with Royston Sinner

## 2021-10-14 ENCOUNTER — Encounter: Payer: Self-pay | Admitting: Physical Therapy

## 2021-10-14 ENCOUNTER — Ambulatory Visit: Payer: Medicare Other | Admitting: Physical Therapy

## 2021-10-14 DIAGNOSIS — M6281 Muscle weakness (generalized): Secondary | ICD-10-CM

## 2021-10-14 DIAGNOSIS — R262 Difficulty in walking, not elsewhere classified: Secondary | ICD-10-CM

## 2021-10-14 NOTE — Therapy (Signed)
OUTPATIENT PHYSICAL THERAPY TREATMENT NOTE   Patient Name: [name] MRN: [id number] DOB:[birth year], 81 y.o., female Today's Date: 10/14/2021  PCP: Inda Coke, PA REFERRING PROVIDER: Inda Coke, PA  END OF SESSION:   PT End of Session - 10/14/21 1346     Visit Number 5    Number of Visits 16    Date for PT Re-Evaluation 12/03/21    Authorization Type BCBS    PT Start Time 1346    PT Stop Time 3762    PT Time Calculation (min) 39 min    Activity Tolerance Patient tolerated treatment well    Behavior During Therapy WFL for tasks assessed/performed             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    HLD (hyperlipidemia)    Melanoma in situ ([place])    Vitamin D deficiency    Past Surgical History:  Procedure Laterality Date   BACK SURGERY     CHOLECYSTECTOMY  2006   IR GASTROSTOMY TUBE MOD SED  11/17/2016   IR GASTROSTOMY TUBE REMOVAL  01/18/2017   S/P Hysterectomy  1990   Patient Active Problem List   Diagnosis Date Noted   Osteoporosis without current pathological fracture 07/04/2019   Anxiety    Anemia 01/22/2017   Benign essential HTN    Dysarthria, post-stroke    Acute blood loss anemia    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   Degenerative spondylolisthesis 06/01/2016   HLD (hyperlipidemia) 06/03/2007   Allergic rhinitis 06/03/2007   VAGINITIS, ATROPHIC 06/03/2007    REFERRING DIAG: R29.898 (ICD-10-CM) - Weakness of both lower extremities M43.10 (ICD-10-CM) - Degenerative spondylolisthesis    THERAPY DIAG:  Muscle weakness (generalized)  Difficulty in walking, not elsewhere classified  Rationale for Evaluation and Treatment Rehabilitation  PERTINENT HISTORY: L2-5 fusion 2018, right knee pain, HTN, 2018 CVA with right sided weakness/reduced sensation in right UE   PRECAUTIONS: None  SUBJECTIVE:   10/14/2021 States that she was feeling better for a couple days after last session but by the weekend she felt like she was back to normal.   Eval:States that she has been having some partial numbness in her feet with the right>left and it is on the top of the foot. States she got up about 3 weeks ago and she was about to change the bed sheets and her leg seemed to get numb so she went to the ED and they cleared her. States that she followed up with Dr. Trenton Gammon and he said everything looks good on the MRI. States he said it could be inflammation causing the numbness. States she has been on prednisone with no change in symptoms. Since all this started the numbness has stayed about the same.   Since all of this started she has started using the rollator, previously using the cane. States she is fearful of driving with her right foot as it is numb.   PAIN:  Are you having pain? Yes: NPRS scale: 10 (numbness)/10 Pain location: top of feet and shins Pain description: numbness, ache Aggravating factors: standing, unsure Relieving factors: rest  OBJECTIVE:    DIAGNOSTIC FINDINGS:  MRI Aug 2023  IMPRESSION: 1. Interval posterior decompression and PLIF from L2-L3 to L4-L5 without residual stenosis. 2. 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  3. Progressive adjacent segment disease at L5-S1 without stenosis.         COGNITION:           Overall cognitive status: Within functional limits for tasks assessed                          SENSATION: reduced sensation in right lower leg - entire lower leg to light touch and pain   EDEMA:    Bilateral with pitting edema on left 3+, no pitting edema on right     POSTURE: rounded shoulders, forward head, and posterior pelvic tilt                  LE Measurements       Lower Extremity Right 09/10/2021 Left 09/10/2021    A/PROM MMT A/PROM MMT  Hip Flexion   3+   4-   Hip Extension          Hip Abduction          Hip Adduction          Hip Internal rotation          Hip External rotation          Knee Flexion   3+   4-  Knee Extension   3+   4-  Ankle Dorsiflexion   4-   4-  Ankle Plantarflexion          Ankle Inversion          Ankle Eversion           (Blank rows = not tested)            * pain     Legs are dependent - right toe blue/red, cold Legs elevated - increased numbness, pale   Observation - hairless and cold legs Bilaterally below the knee   GAIT: Distance walked: 15 feet Assistive device utilized: rollator Level of assistance: Modified independence Comments: unstable, wide BOS, right foot inverted       TODAY'S TREATMENT: 10/14/2021 Seated:ankle pumps x30 R, STS with weight shift x20 , piriformis stretch x10 B 10" holds, hamstring stretch x15 10"holds B, sciatic nerve glide x15 R  Standing: walking with walker - with heel toe strike  Neuro: vibration to B LE for improved proprioceptive awareness with cushion - 15 minutes total       PATIENT EDUCATION:  Education details: on HEP,  on different types of vibration units Person educated: Patient Education method: Programmer, multimedia, Demonstration, and Handouts Education comprehension: verbalized understanding   HOME EXERCISE PROGRAM: [id number]   ASSESSMENT:   CLINICAL IMPRESSION:  10/14/2021 Continued with vibration but minimal changes noted and this may be a result to right leg elevated during application. Added neural stretch and hip stretches which were tolerated well. Continued to focus on education and functional strength       OBJECTIVE IMPAIRMENTS Abnormal gait, decreased activity tolerance, decreased balance, decreased knowledge of use of DME, difficulty walking, decreased ROM, decreased strength, impaired sensation, postural dysfunction, and pain.    ACTIVITY LIMITATIONS standing, stairs, and transfers   PARTICIPATION LIMITATIONS: driving and community activity    PERSONAL FACTORS Age, Fitness, and 1-2 comorbidities: hx of CVA  are also affecting patient's functional outcome.    REHAB POTENTIAL: Good   CLINICAL DECISION MAKING: Evolving/moderate complexity   EVALUATION COMPLEXITY: Moderate   GOALS: Goals reviewed with patient?  yes   SHORT TERM GOALS:   Patient will be  independent in self management strategies to improve quality of life and functional outcomes. Baseline: new program Target date: 10/22/2021 Goal status: INITIAL   2.  Patient will report at least 50% improvement in overall symptoms and/or function to demonstrate improved functional mobility Baseline: 0% Target date: 10/22/2021 Goal status: INITIAL   3.  Patient will be able to walk/stand for at least 15 minutes to improve ability to make a meal. Baseline: very difficult/unable Target date: 10/22/2021 Goal status: INITIAL         LONG TERM GOALS:   Patient will report at least 75% improvement in overall symptoms and/or function to demonstrate improved functional mobility Baseline: 0% Target date: 12/03/2021 Goal status: INITIAL   2.  Patient will be able to ascend and descend steps with railing and step to gait pattern if needed to improve ability to go up and down stairs. Baseline: unable Target date: 12/03/2021 Goal status: INITIAL   3.  Patient will be able to walk with cane at least 137feet without concerns of falling to improve community mobility. Baseline: unable Target date: 12/03/2021 Goal status: INITIAL     PLAN: PT FREQUENCY: 1-2x/week for total of 16 visits over 12 week certification   PT DURATION: 12 weeks   PLANNED INTERVENTIONS: Therapeutic exercises, Therapeutic activity, Neuromuscular re-education, Balance training, Gait training, Patient/Family education, Self Care, Joint mobilization, Stair training, Vestibular training, Aquatic Therapy, Dry Needling, Electrical stimulation, Cryotherapy, Moist heat, Ultrasound, Ionotophoresis 4mg /ml  Dexamethasone, and Manual therapy   PLAN FOR NEXT SESSION: f/u with ABI, assess balance, LE balance and strengthening       2:30 PM, 10/14/21 12/14/21, DPT Physical Therapy with Tereasa Coop

## 2021-10-21 ENCOUNTER — Ambulatory Visit (INDEPENDENT_AMBULATORY_CARE_PROVIDER_SITE_OTHER): Payer: Medicare Other | Admitting: *Deleted

## 2021-10-21 ENCOUNTER — Encounter: Payer: Self-pay | Admitting: Physical Therapy

## 2021-10-21 ENCOUNTER — Ambulatory Visit: Payer: Medicare Other | Admitting: Physical Therapy

## 2021-10-21 DIAGNOSIS — R262 Difficulty in walking, not elsewhere classified: Secondary | ICD-10-CM

## 2021-10-21 DIAGNOSIS — E538 Deficiency of other specified B group vitamins: Secondary | ICD-10-CM | POA: Diagnosis not present

## 2021-10-21 DIAGNOSIS — M6281 Muscle weakness (generalized): Secondary | ICD-10-CM | POA: Diagnosis not present

## 2021-10-21 MED ORDER — CYANOCOBALAMIN 1000 MCG/ML IJ SOLN
1000.0000 ug | Freq: Once | INTRAMUSCULAR | Status: AC
  Administered 2021-10-21: 1000 ug via INTRAMUSCULAR

## 2021-10-21 NOTE — Progress Notes (Signed)
Per orders of [name] Worley. PA, injection of B 12  given in right deltoid per patient preference by Moraima Burd S Yadhira Mckneely, CMA. Patient tolerated injection well. Patient reminded to schedule next injection. 

## 2021-10-21 NOTE — Therapy (Signed)
OUTPATIENT PHYSICAL THERAPY TREATMENT NOTE   Patient Name: [name] MRN: [id number] DOB:[birth year], 81 y.o., female Today's Date: 10/21/2021  PCP: Inda Coke, PA REFERRING PROVIDER: Inda Coke, PA  END OF SESSION:   PT End of Session - 10/21/21 1328     Visit Number 6    Number of Visits 16    Date for PT Re-Evaluation 12/03/21    Authorization Type BCBS    PT Start Time 1342    PT Stop Time 1424    PT Time Calculation (min) 42 min    Activity Tolerance Patient tolerated treatment well    Behavior During Therapy WFL for tasks assessed/performed             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    HLD (hyperlipidemia)    Melanoma in situ ([place])    Vitamin D deficiency    Past Surgical History:  Procedure Laterality Date   BACK SURGERY     CHOLECYSTECTOMY  2006   IR GASTROSTOMY TUBE MOD SED  11/17/2016   IR GASTROSTOMY TUBE REMOVAL  01/18/2017   S/P Hysterectomy  1990   Patient Active Problem List   Diagnosis Date Noted   Osteoporosis without current pathological fracture 07/04/2019   Anxiety    Anemia 01/22/2017   Benign essential HTN    Dysarthria, post-stroke    Acute blood loss anemia    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   Degenerative spondylolisthesis 06/01/2016   HLD (hyperlipidemia) 06/03/2007   Allergic rhinitis 06/03/2007   VAGINITIS, ATROPHIC 06/03/2007    REFERRING DIAG: R29.898 (ICD-10-CM) - Weakness of both lower extremities M43.10 (ICD-10-CM) - Degenerative spondylolisthesis    THERAPY DIAG:  Muscle weakness (generalized)  Difficulty in walking, not elsewhere classified  Rationale for Evaluation and Treatment Rehabilitation  PERTINENT HISTORY: L2-5 fusion 2018, right knee pain, HTN, 2018 CVA with right sided weakness/reduced sensation in right UE   PRECAUTIONS: None  SUBJECTIVE:   10/21/2021 States that she has not gotten her massager yet. States that the numbness is the same. States some times it is better and sometimes it is worse. States her knee is bothering her today but she walked a good bit. States no difficulties with her exercises. States overall she feels 80% better she is walking without the cane more but she also feels like she needs it when she is outside.  Eval:States that she has been having some partial numbness in her feet with the right>left and it is on the top of the foot. States she got up about 3 weeks ago and she was about to change the bed sheets and her leg seemed to get numb so she went to the ED and they cleared her. States that she followed up with Dr. Trenton Gammon and he said everything looks good on the MRI. States he said it could be inflammation causing the numbness. States she has been on prednisone with no change in symptoms. Since all this started the numbness has stayed about the same.   Since all of this started she has started using the rollator, previously using the cane. States she is fearful of driving with her right foot as it is numb.   PAIN:  Are you having pain? Yes: NPRS scale: 8 (numbness)/10 Pain location: top of feet and shins Pain description: numbness, ache Aggravating factors: standing, unsure Relieving  factors: rest  OBJECTIVE:    DIAGNOSTIC FINDINGS:  MRI Aug 2023  IMPRESSION: 1. Interval posterior decompression and PLIF from L2-L3 to L4-L5 without residual stenosis. 2. 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 3. Progressive adjacent segment disease at L5-S1 without stenosis.         COGNITION:           Overall cognitive status: Within functional limits for tasks assessed                          SENSATION: reduced sensation in right lower leg - entire lower leg to light touch and pain   EDEMA:    Bilateral with pitting edema on  left 3+, no pitting edema on right     POSTURE: rounded shoulders, forward head, and posterior pelvic tilt                  LE Measurements       Lower Extremity Right 10/21/21 Left 10/102023    A/PROM MMT A/PROM MMT  Hip Flexion   4   4  Hip Extension          Hip Abduction          Hip Adduction          Hip Internal rotation          Hip External rotation          Knee Flexion   4-   4-  Knee Extension   4   4  Ankle Dorsiflexion   4-   4-  Ankle Plantarflexion          Ankle Inversion          Ankle Eversion           (Blank rows = not tested)            * pain       GAIT: Distance walked: 190 feet Assistive device utilized: cane Level of assistance: Modified independence Comments: unstable, wide BOS, right foot inverted, swings right leg out to side       TODAY'S TREATMENT: 10/21/2021 Seated:ankle pumps x30 R, STS with weight shift x20   Standing: 4" step 2 hand hold to 1 hand hold 3x10 B step taps and then step ups Aerobic: bike 5 minutes L2     PATIENT EDUCATION:  Education details: on HEP,  on current presentation, on safety with walking and driving. Person educated: Patient Education method: Explanation, Demonstration, and Handouts Education comprehension: verbalized understanding   HOME EXERCISE PROGRAM: [id number]   ASSESSMENT:   CLINICAL IMPRESSION:  10/21/2021 Assessed gait today and goals and overall patient is doing well. Added standing exercises for balance and strength which were tolerated well. Added bike and SBA needed and cues to push with feet secondary to reduced feeling in legs. Fatigue noted end of session but no pain or change in numbness.      OBJECTIVE IMPAIRMENTS Abnormal gait, decreased activity tolerance, decreased balance, decreased knowledge of use of DME, difficulty walking, decreased ROM, decreased strength, impaired sensation, postural dysfunction, and pain.    ACTIVITY LIMITATIONS standing, stairs, and transfers    PARTICIPATION LIMITATIONS: driving and community activity   PERSONAL FACTORS Age, Fitness, and 1-2 comorbidities: hx of CVA  are also affecting patient's functional outcome.    REHAB POTENTIAL: Good   CLINICAL DECISION MAKING: Evolving/moderate complexity   EVALUATION  COMPLEXITY: Moderate   GOALS: Goals reviewed with patient?  yes   SHORT TERM GOALS:   Patient will be independent in self management strategies to improve quality of life and functional outcomes. Baseline: new program Target date: 10/22/2021 Goal status: MET   2.  Patient will report at least 50% improvement in overall symptoms and/or function to demonstrate improved functional mobility Baseline: 0% Target date: 10/22/2021 Goal status: MET   3.  Patient will be able to walk/stand for at least 15 minutes to improve ability to make a meal. Baseline: very difficult/unable Target date: 10/22/2021 Goal status: MET         LONG TERM GOALS:   Patient will report at least 75% improvement in overall symptoms and/or function to demonstrate improved functional mobility Baseline: 0% Target date: 12/03/2021 Goal status: MET   2.  Patient will be able to ascend and descend steps with railing and step to gait pattern if needed to improve ability to go up and down stairs. Baseline: unable Target date: 12/03/2021 Goal status: PROGRESSING   3.  Patient will be able to walk with cane at least 140fet without concerns of falling to improve community mobility. Baseline: unable Target date: 12/03/2021 Goal status: PROGRESSING able to do but fearful outside     PLAN: PT FREQUENCY: 1-2x/week for total of 16 visits over 12 week certification   PT DURATION: 12 weeks   PLANNED INTERVENTIONS: Therapeutic exercises, Therapeutic activity, Neuromuscular re-education, Balance training, Gait training, Patient/Family education, Self Care, Joint mobilization, Stair training, Vestibular training, Aquatic Therapy, Dry Needling,  Electrical stimulation, Cryotherapy, Moist heat, Ultrasound, Ionotophoresis 461mml Dexamethasone, and Manual therapy   PLAN FOR NEXT SESSION: balance, LE strengthening       2:27 PM, 10/21/21 MiJerene PitchDPT Physical Therapy with [facility] Sinner

## 2021-10-23 ENCOUNTER — Encounter: Payer: Self-pay | Admitting: Physical Therapy

## 2021-10-23 ENCOUNTER — Ambulatory Visit: Payer: Medicare Other | Admitting: Physical Therapy

## 2021-10-23 DIAGNOSIS — M6281 Muscle weakness (generalized): Secondary | ICD-10-CM

## 2021-10-23 DIAGNOSIS — R262 Difficulty in walking, not elsewhere classified: Secondary | ICD-10-CM | POA: Diagnosis not present

## 2021-10-23 NOTE — Therapy (Signed)
OUTPATIENT PHYSICAL THERAPY TREATMENT NOTE   Patient Name: [name] MRN: [id number] DOB:[birth year], 81 y.o., female Today's Date: 10/23/2021  PCP: Inda Coke, PA REFERRING PROVIDER: Inda Coke, PA  END OF SESSION:   PT End of Session - 10/23/21 1348     Visit Number 7    Number of Visits 16    Date for PT Re-Evaluation 12/03/21    Authorization Type BCBS    PT Start Time 1348    PT Stop Time 1426    PT Time Calculation (min) 38 min    Activity Tolerance Patient tolerated treatment well    Behavior During Therapy WFL for tasks assessed/performed             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    HLD (hyperlipidemia)    Melanoma in situ ([facility])    Vitamin D deficiency    Past Surgical History:  Procedure Laterality Date   BACK SURGERY     CHOLECYSTECTOMY  2006   IR GASTROSTOMY TUBE MOD SED  11/17/2016   IR GASTROSTOMY TUBE REMOVAL  01/18/2017   S/P Hysterectomy  1990   Patient Active Problem List   Diagnosis Date Noted   Osteoporosis without current pathological fracture 07/04/2019   Anxiety    Anemia 01/22/2017   Benign essential HTN    Dysarthria, post-stroke    Acute blood loss anemia    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   Degenerative spondylolisthesis 06/01/2016   HLD (hyperlipidemia) 06/03/2007   Allergic rhinitis 06/03/2007   VAGINITIS, ATROPHIC 06/03/2007    REFERRING DIAG: R29.898 (ICD-10-CM) - Weakness of both lower extremities M43.10 (ICD-10-CM) - Degenerative spondylolisthesis    THERAPY DIAG:  Muscle weakness (generalized)  Difficulty in walking, not elsewhere classified  Rationale for Evaluation and Treatment Rehabilitation  PERTINENT HISTORY: L2-5 fusion 2018, right knee pain, HTN, 2018 CVA with right sided weakness/reduced sensation in right UE   PRECAUTIONS: None  SUBJECTIVE:   10/23/2021 States she got her vibration unit and it doesn't seem to be working. Stats that her numbness is not as bad and she feels it is more in her toes.   Eval:States that she has been having some partial numbness in her feet with the right>left and it is on the top of the foot. States she got up about 3 weeks ago and she was about to change the bed sheets and her leg seemed to get numb so she went to the ED and they cleared her. States that she followed up with Dr. Trenton Gammon and he said everything looks good on the MRI. States he said it could be inflammation causing the numbness. States she has been on prednisone with no change in symptoms. Since all this started the numbness has stayed about the same.   Since all of this started she has started using the rollator, previously using the cane. States she is fearful of driving with her right foot as it is numb.   PAIN:  Are you having pain? Yes: NPRS scale: 8 (numbness)/10 Pain location: top of feet and shins Pain description: numbness, ache Aggravating factors: standing, unsure Relieving factors: rest  OBJECTIVE:    DIAGNOSTIC FINDINGS:  MRI Aug 2023  IMPRESSION: 1. Interval posterior decompression and PLIF from L2-L3 to L4-L5 without residual stenosis. 2. 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 3. Progressive adjacent segment disease at L5-S1 without stenosis.         COGNITION:           Overall cognitive status: Within functional limits for tasks assessed                          SENSATION: reduced sensation in right lower leg - entire lower leg to light touch and pain   EDEMA:    Bilateral with pitting edema on left 3+, no pitting edema on right     POSTURE: rounded shoulders, forward head, and posterior pelvic tilt                  LE Measurements       Lower Extremity Right 10/21/21 Left 10/102023    A/PROM MMT A/PROM MMT  Hip  Flexion   4   4  Hip Extension          Hip Abduction          Hip Adduction          Hip Internal rotation          Hip External rotation          Knee Flexion   4-   4-  Knee Extension   4   4  Ankle Dorsiflexion   4-   4-  Ankle Plantarflexion          Ankle Inversion          Ankle Eversion           (Blank rows = not tested)            * pain       GAIT: Distance walked: 190 feet Assistive device utilized: cane Level of assistance: Modified independence Comments: unstable, wide BOS, right foot inverted, swings right leg out to side       TODAY'S TREATMENT: 10/23/2021 Seated:ankle pumps x30 R, STS with weight shift x20   Standing: 4" step 2 hand hold to 1 hand hold 3x10 B step taps and then step ups, heel raises x10 B, DF 2x10 B one at a time with back up against wall  Aerobic: bike 5 minutes L2 Neuro: staggered step stance 4" height x3 30" holds B, narrow BOS occ UE support head turns and head diagonals 5 minutes   PATIENT EDUCATION:  Education details: on HEP Person educated: Patient Education method: Explanation, Media planner, and Handouts Education comprehension: verbalized understanding   HOME EXERCISE PROGRAM: [id number]   ASSESSMENT:   CLINICAL IMPRESSION:  10/23/2021 Continued to progress standing exercises today which were tolerated well. Fatigue noted but no pain. SBA for all standing exercises. Improved gait noted in right leg today. Overall patient is doing well and will continue with current POC as tolerated..       OBJECTIVE IMPAIRMENTS Abnormal gait, decreased activity tolerance, decreased balance, decreased knowledge of use of DME, difficulty walking, decreased ROM, decreased strength, impaired sensation, postural dysfunction, and pain.    ACTIVITY LIMITATIONS standing, stairs, and transfers   PARTICIPATION LIMITATIONS: driving and community activity   PERSONAL FACTORS Age, Fitness, and 1-2 comorbidities: hx of CVA  are also affecting  patient's functional outcome.    REHAB POTENTIAL: Good   CLINICAL DECISION MAKING: Evolving/moderate complexity   EVALUATION COMPLEXITY: Moderate   GOALS: Goals reviewed with patient?  yes   SHORT TERM GOALS:   Patient will be independent in  self management strategies to improve quality of life and functional outcomes. Baseline: new program Target date: 10/22/2021 Goal status: MET   2.  Patient will report at least 50% improvement in overall symptoms and/or function to demonstrate improved functional mobility Baseline: 0% Target date: 10/22/2021 Goal status: MET   3.  Patient will be able to walk/stand for at least 15 minutes to improve ability to make a meal. Baseline: very difficult/unable Target date: 10/22/2021 Goal status: MET         LONG TERM GOALS:   Patient will report at least 75% improvement in overall symptoms and/or function to demonstrate improved functional mobility Baseline: 0% Target date: 12/03/2021 Goal status: MET   2.  Patient will be able to ascend and descend steps with railing and step to gait pattern if needed to improve ability to go up and down stairs. Baseline: unable Target date: 12/03/2021 Goal status: PROGRESSING   3.  Patient will be able to walk with cane at least 147fet without concerns of falling to improve community mobility. Baseline: unable Target date: 12/03/2021 Goal status: PROGRESSING able to do but fearful outside     PLAN: PT FREQUENCY: 1-2x/week for total of 16 visits over 12 week certification   PT DURATION: 12 weeks   PLANNED INTERVENTIONS: Therapeutic exercises, Therapeutic activity, Neuromuscular re-education, Balance training, Gait training, Patient/Family education, Self Care, Joint mobilization, Stair training, Vestibular training, Aquatic Therapy, Dry Needling, Electrical stimulation, Cryotherapy, Moist heat, Ultrasound, Ionotophoresis 452mml Dexamethasone, and Manual therapy   PLAN FOR NEXT SESSION:  balance, LE strengthening       2:28 PM, 10/23/21 MiJerene PitchDPT Physical Therapy with CoRoyston Sinner

## 2021-10-28 ENCOUNTER — Ambulatory Visit: Payer: Medicare Other | Admitting: Physical Therapy

## 2021-10-28 ENCOUNTER — Encounter: Payer: Self-pay | Admitting: Physical Therapy

## 2021-10-28 DIAGNOSIS — R262 Difficulty in walking, not elsewhere classified: Secondary | ICD-10-CM | POA: Diagnosis not present

## 2021-10-28 DIAGNOSIS — M6281 Muscle weakness (generalized): Secondary | ICD-10-CM | POA: Diagnosis not present

## 2021-10-28 NOTE — Therapy (Signed)
OUTPATIENT PHYSICAL THERAPY TREATMENT NOTE   Patient Name: [name] MRN: [id number] DOB:1940-10-20, 81 y.o., female Today's Date: 10/28/2021  PCP: Inda Coke, PA REFERRING PROVIDER: Inda Coke, PA  END OF SESSION:   PT End of Session - 10/28/21 1346     Visit Number 8    Number of Visits 16    Date for PT Re-Evaluation 12/03/21    Authorization Type BCBS    PT Start Time 1347    PT Stop Time 1427    PT Time Calculation (min) 40 min    Activity Tolerance Patient tolerated treatment well    Behavior During Therapy [facility] for tasks assessed/performed             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    HLD (hyperlipidemia)    Melanoma in situ ([place])    Vitamin D deficiency    Past Surgical History:  Procedure Laterality Date   BACK SURGERY     CHOLECYSTECTOMY  2006   IR GASTROSTOMY TUBE MOD SED  11/17/2016   IR GASTROSTOMY TUBE REMOVAL  01/18/2017   S/P Hysterectomy  1990   Patient Active Problem List   Diagnosis Date Noted   Osteoporosis without current pathological fracture 07/04/2019   Anxiety    Anemia 01/22/2017   Benign essential HTN    Dysarthria, post-stroke    Acute blood loss anemia    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   Degenerative spondylolisthesis 06/01/2016   HLD (hyperlipidemia) 06/03/2007   Allergic rhinitis 06/03/2007   VAGINITIS, ATROPHIC 06/03/2007    REFERRING DIAG: R29.898 (ICD-10-CM) - Weakness of both lower extremities M43.10 (ICD-10-CM) - Degenerative spondylolisthesis    THERAPY DIAG:  Muscle weakness (generalized)  Difficulty in walking, not elsewhere classified  Rationale for Evaluation and Treatment Rehabilitation  PERTINENT HISTORY: L2-5 fusion 2018, right knee pain, HTN, 2018 CVA with right sided weakness/reduced sensation in right UE   PRECAUTIONS: None  SUBJECTIVE:   10/28/2021 States that she had reduced numbness yesterday but increased numbness on Saturday.   Eval:States that she has been having some partial numbness in her feet with the right>left and it is on the top of the foot. States she got up about 3 weeks ago and she was about to change the bed sheets and her leg seemed to get numb so she went to the ED and they cleared her. States that she followed up with Dr. Trenton Gammon and he said everything looks good on the MRI. States he said it could be inflammation causing the numbness. States she has been on prednisone with no change in symptoms. Since all this started the numbness has stayed about the same.   Since all of this started she has started using the rollator, previously using the cane. States she is fearful of driving with her right foot as it is numb.   PAIN:  Are you having pain? Yes: NPRS scale: 7(numbness)/10 Pain location: top of feet and shins Pain description: numbness, ache Aggravating factors: standing, unsure Relieving factors: rest  OBJECTIVE:    DIAGNOSTIC FINDINGS:  MRI Aug 2023  IMPRESSION: 1. Interval posterior decompression and PLIF from L2-L3 to L4-L5 without residual stenosis. 2. 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 3. Progressive adjacent segment disease at L5-S1 without stenosis.  COGNITION:           Overall cognitive status: Within functional limits for tasks assessed                          SENSATION: reduced sensation in right lower leg - entire lower leg to light touch and pain   EDEMA:    Bilateral with pitting edema on left 3+, no pitting edema on right     POSTURE: rounded shoulders, forward head, and posterior pelvic tilt                  LE Measurements       Lower Extremity Right 10/21/21 Left 10/102023    A/PROM MMT A/PROM MMT  Hip Flexion   4   4  Hip Extension          Hip Abduction          Hip  Adduction          Hip Internal rotation          Hip External rotation          Knee Flexion   4-   4-  Knee Extension   4   4  Ankle Dorsiflexion   4-   4-  Ankle Plantarflexion          Ankle Inversion          Ankle Eversion           (Blank rows = not tested)            * pain       GAIT: Distance walked: 190 feet Assistive device utilized: cane Level of assistance: Modified independence Comments: unstable, wide BOS, right foot inverted, swings right leg out to side       TODAY'S TREATMENT: 10/28/2021 TE:  Seated:heel toe raises x20 B,LAQs 3x10 B, hamstring stretch x3 30" holds B, piriformis stretch x3 30" holds B Standing: 6" step  1 hand hold 3x10 B step taps and then step ups, stairs alternating pattern x6 reps of 6"Step with 2 hand holds, heel raises x10 B, DF 2x10 B one at a time with back up against wall, hip abd 3x10 B 2 hand support Aerobic: bike 8.5 minutes L2 Neuro:     PATIENT EDUCATION:  Education details: on HEP Person educated: Patient Education method: Consulting civil engineer, Media planner, and Handouts Education comprehension: verbalized understanding   HOME EXERCISE PROGRAM: [id number]   ASSESSMENT:   CLINICAL IMPRESSION:  10/28/2021 Able to progress exercises today with good tolerance. Reduced numbness noted with activity. Overall balance and function is better but continues to have difficulties with balance on R LE. Fatigue in leg noted after standing exercises, transitioned to seated exercise for active rest and this was tolerated well. Will continue with current POC as tolerated.       OBJECTIVE IMPAIRMENTS Abnormal gait, decreased activity tolerance, decreased balance, decreased knowledge of use of DME, difficulty walking, decreased ROM, decreased strength, impaired sensation, postural dysfunction, and pain.    ACTIVITY LIMITATIONS standing, stairs, and transfers   PARTICIPATION LIMITATIONS: driving and community activity   PERSONAL FACTORS Age,  Fitness, and 1-2 comorbidities: hx of CVA  are also affecting patient's functional outcome.    REHAB POTENTIAL: Good   CLINICAL DECISION MAKING: Evolving/moderate complexity   EVALUATION COMPLEXITY: Moderate   GOALS: Goals reviewed with patient?  yes   SHORT TERM GOALS:   Patient will be independent in self management  strategies to improve quality of life and functional outcomes. Baseline: new program Target date: 10/22/2021 Goal status: MET   2.  Patient will report at least 50% improvement in overall symptoms and/or function to demonstrate improved functional mobility Baseline: 0% Target date: 10/22/2021 Goal status: MET   3.  Patient will be able to walk/stand for at least 15 minutes to improve ability to make a meal. Baseline: very difficult/unable Target date: 10/22/2021 Goal status: MET         LONG TERM GOALS:   Patient will report at least 75% improvement in overall symptoms and/or function to demonstrate improved functional mobility Baseline: 0% Target date: 12/03/2021 Goal status: MET   2.  Patient will be able to ascend and descend steps with railing and step to gait pattern if needed to improve ability to go up and down stairs. Baseline: unable Target date: 12/03/2021 Goal status: PROGRESSING   3.  Patient will be able to walk with cane at least 171fet without concerns of falling to improve community mobility. Baseline: unable Target date: 12/03/2021 Goal status: PROGRESSING able to do but fearful outside     PLAN: PT FREQUENCY: 1-2x/week for total of 16 visits over 12 week certification   PT DURATION: 12 weeks   PLANNED INTERVENTIONS: Therapeutic exercises, Therapeutic activity, Neuromuscular re-education, Balance training, Gait training, Patient/Family education, Self Care, Joint mobilization, Stair training, Vestibular training, Aquatic Therapy, Dry Needling, Electrical stimulation, Cryotherapy, Moist heat, Ultrasound, Ionotophoresis 437mml  Dexamethasone, and Manual therapy   PLAN FOR NEXT SESSION: balance, LE strengthening       2:28 PM, 10/28/21 MiJerene PitchDPT Physical Therapy with Co[place]ston Sinner

## 2021-10-30 ENCOUNTER — Ambulatory Visit: Payer: Medicare Other | Admitting: Physical Therapy

## 2021-10-30 ENCOUNTER — Encounter: Payer: Self-pay | Admitting: Physical Therapy

## 2021-10-30 DIAGNOSIS — R262 Difficulty in walking, not elsewhere classified: Secondary | ICD-10-CM | POA: Diagnosis not present

## 2021-10-30 DIAGNOSIS — M6281 Muscle weakness (generalized): Secondary | ICD-10-CM | POA: Diagnosis not present

## 2021-10-30 NOTE — Therapy (Signed)
OUTPATIENT PHYSICAL THERAPY TREATMENT NOTE   Patient Name: [name] MRN: [id number] DOB:Jan 23, 1940, 81 y.o., female Today's Date: 10/30/2021  PCP: Inda Coke, PA REFERRING PROVIDER: Inda Coke, PA  END OF SESSION:   PT End of Session - 10/30/21 1432     Visit Number 9    Number of Visits 16    Date for PT Re-Evaluation 12/03/21    Authorization Type [employer]BS    PT Start Time 1433    PT Stop Time 1512    PT Time Calculation (min) 39 min    Activity Tolerance Patient tolerated treatment well    Behavior During Therapy WFL for tasks assessed/performed             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    HLD (hyperlipidemia)    Melanoma in situ ([place])    Vitamin D deficiency    Past Surgical History:  Procedure Laterality Date   BACK SURGERY     CHOLECYSTECTOMY  2006   IR GASTROSTOMY TUBE MOD SED  11/17/2016   IR GASTROSTOMY TUBE REMOVAL  01/18/2017   S/P Hysterectomy  1990   Patient Active Problem List   Diagnosis Date Noted   Osteoporosis without current pathological fracture 07/04/2019   Anxiety    Anemia 01/22/2017   Benign essential HTN    Dysarthria, post-stroke    Acute blood loss anemia    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   Degenerative spondylolisthesis 06/01/2016   HLD (hyperlipidemia) 06/03/2007   Allergic rhinitis 06/03/2007   VAGINITIS, ATROPHIC 06/03/2007    REFERRING DIAG: R29.898 (ICD-10-CM) - Weakness of both lower extremities M43.10 (ICD-10-CM) - Degenerative spondylolisthesis    THERAPY DIAG:  Muscle weakness (generalized)  Difficulty in walking, not elsewhere classified  Rationale for Evaluation and Treatment Rehabilitation  PERTINENT HISTORY: L2-5 fusion 2018, right knee pain, HTN, 2018 CVA with right sided weakness/reduced sensation in right UE   PRECAUTIONS: None  SUBJECTIVE:   10/30/2021 States that she had reduced numbness yesterday but increased numbness on Saturday.   Eval:States that she has been having some partial numbness in her feet with the right>left and it is on the top of the foot. States she got up about 3 weeks ago and she was about to change the bed sheets and her leg seemed to get numb so she went to the ED and they cleared her. States that she followed up with Dr. Trenton Gammon and he said everything looks good on the MRI. States he said it could be inflammation causing the numbness. States she has been on prednisone with no change in symptoms. Since all this started the numbness has stayed about the same.   Since all of this started she has started using the rollator, previously using the cane. States she is fearful of driving with her right foot as it is numb.   PAIN:  Are you having pain? Yes: NPRS scale: 7(numbness)/10 Pain location: top of feet and shins Pain description: numbness, ache Aggravating factors: standing, unsure Relieving factors: rest  OBJECTIVE:    DIAGNOSTIC FINDINGS:  MRI Aug 2023  IMPRESSION: 1. Interval posterior decompression and PLIF from L2-L3 to L4-L5 without residual stenosis. 2. 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 3. Progressive adjacent segment disease at L5-S1 without stenosis.  COGNITION:           Overall cognitive status: Within functional limits for tasks assessed                          SENSATION: reduced sensation in right lower leg - entire lower leg to light touch and pain   EDEMA:    Bilateral with pitting edema on left 3+, no pitting edema on right     POSTURE: rounded shoulders, forward head, and posterior pelvic tilt                  LE Measurements       Lower Extremity Right 10/21/21 Left 10/102023    A/PROM MMT A/PROM MMT  Hip Flexion   4   4  Hip Extension          Hip Abduction          Hip  Adduction          Hip Internal rotation          Hip External rotation          Knee Flexion   4-   4-  Knee Extension   4   4  Ankle Dorsiflexion   4-   4-  Ankle Plantarflexion          Ankle Inversion          Ankle Eversion           (Blank rows = not tested)            * pain       GAIT: Distance walked: 190 feet Assistive device utilized: cane Level of assistance: Modified independence Comments: unstable, wide BOS, right foot inverted, swings right leg out to side       TODAY'S TREATMENT: 10/30/2021 TE:  Seated:2#  on ankles: SAQs 3x15 B, Marching 2x15 B, STS x25 focus on R LE  Standing: hip abd x10 B 2 hand support, hip extension 2 hand support 3x5 B, hip flexion 1 hand support 3x5 B Aerobic: bike 7 minutes L2 Gait: walking with cane 220 feet total - cues for heel toe walking    PATIENT EDUCATION:  Education details: on HEP Person educated: Patient Education method: Consulting civil engineer, Media planner, and Handouts Education comprehension: verbalized understanding   HOME EXERCISE PROGRAM: [id number]   ASSESSMENT:   CLINICAL IMPRESSION:  10/30/2021  Continued to progress strengthening exercises which were tolerated well. Practiced walking in cane and patient still presents with right steppage gait due to numbness but overall patient demonstrates improved balance. Fatigue in legs but no pain. Will continue with current POC as tolerated.       OBJECTIVE IMPAIRMENTS Abnormal gait, decreased activity tolerance, decreased balance, decreased knowledge of use of DME, difficulty walking, decreased ROM, decreased strength, impaired sensation, postural dysfunction, and pain.    ACTIVITY LIMITATIONS standing, stairs, and transfers   PARTICIPATION LIMITATIONS: driving and community activity   PERSONAL FACTORS Age, Fitness, and 1-2 comorbidities: hx of CVA  are also affecting patient's functional outcome.    REHAB POTENTIAL: Good   CLINICAL DECISION MAKING: Evolving/moderate  complexity   EVALUATION COMPLEXITY: Moderate   GOALS: Goals reviewed with patient?  yes   SHORT TERM GOALS:   Patient will be independent in self management strategies to improve quality of life and functional outcomes. Baseline: new program Target date: 10/22/2021 Goal status: MET   2.  Patient will report at least 50% improvement  in overall symptoms and/or function to demonstrate improved functional mobility Baseline: 0% Target date: 10/22/2021 Goal status: MET   3.  Patient will be able to walk/stand for at least 15 minutes to improve ability to make a meal. Baseline: very difficult/unable Target date: 10/22/2021 Goal status: MET         LONG TERM GOALS:   Patient will report at least 75% improvement in overall symptoms and/or function to demonstrate improved functional mobility Baseline: 0% Target date: 12/03/2021 Goal status: MET   2.  Patient will be able to ascend and descend steps with railing and step to gait pattern if needed to improve ability to go up and down stairs. Baseline: unable Target date: 12/03/2021 Goal status: PROGRESSING   3.  Patient will be able to walk with cane at least 141fet without concerns of falling to improve community mobility. Baseline: unable Target date: 12/03/2021 Goal status: PROGRESSING able to do but fearful outside     PLAN: PT FREQUENCY: 1-2x/week for total of 16 visits over 12 week certification   PT DURATION: 12 weeks   PLANNED INTERVENTIONS: Therapeutic exercises, Therapeutic activity, Neuromuscular re-education, Balance training, Gait training, Patient/Family education, Self Care, Joint mobilization, Stair training, Vestibular training, Aquatic Therapy, Dry Needling, Electrical stimulation, Cryotherapy, Moist heat, Ultrasound, Ionotophoresis 462mml Dexamethasone, and Manual therapy   PLAN FOR NEXT SESSION: balance, LE strengthening       3:15 PM, 10/30/21 MiJerene PitchDPT Physical Therapy with  CoRoyston Sinner

## 2021-11-04 ENCOUNTER — Ambulatory Visit: Payer: Medicare Other | Admitting: Physical Therapy

## 2021-11-04 ENCOUNTER — Encounter: Payer: Self-pay | Admitting: Physical Therapy

## 2021-11-04 DIAGNOSIS — M6281 Muscle weakness (generalized): Secondary | ICD-10-CM | POA: Diagnosis not present

## 2021-11-04 DIAGNOSIS — R262 Difficulty in walking, not elsewhere classified: Secondary | ICD-10-CM

## 2021-11-04 NOTE — Therapy (Signed)
OUTPATIENT PHYSICAL THERAPY TREATMENT NOTE AND PROGRESS NOTE    Patient Name: [name] MRN: [id number] DOB:1940/03/20, 81 y.o., female Today's Date: 11/04/2021  PCP: Inda Coke, PA REFERRING PROVIDER: Inda Coke, PA  END OF SESSION:   PT End of Session - 11/04/21 1348     Visit Number 10    Number of Visits 16    Date for PT Re-Evaluation 12/03/21    Authorization Type BCBS    Progress Note Due on Visit 20    PT Start Time 1351    PT Stop Time 1430    PT Time Calculation (min) 39 min    Activity Tolerance Patient tolerated treatment well    Behavior During Therapy WFL for tasks assessed/performed             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    HLD (hyperlipidemia)    Melanoma in situ ([place])    Vitamin D deficiency    Past Surgical History:  Procedure Laterality Date   BACK SURGERY     CHOLECYSTECTOMY  2006   IR GASTROSTOMY TUBE MOD SED  11/17/2016   IR GASTROSTOMY TUBE REMOVAL  01/18/2017   S/P Hysterectomy  1990   Patient Active Problem List   Diagnosis Date Noted   Osteoporosis without current pathological fracture 07/04/2019   Anxiety    Anemia 01/22/2017   Benign essential HTN    Dysarthria, post-stroke    Acute blood loss anemia    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   Degenerative spondylolisthesis 06/01/2016   HLD (hyperlipidemia) 06/03/2007   Allergic rhinitis 06/03/2007   VAGINITIS, ATROPHIC 06/03/2007    REFERRING DIAG: R29.898 (ICD-10-CM) - Weakness of both lower extremities M43.10 (ICD-10-CM) - Degenerative spondylolisthesis    THERAPY DIAG:  Muscle weakness (generalized)  Difficulty in walking, not elsewhere classified  Rationale for Evaluation and Treatment Rehabilitation  PERTINENT HISTORY: L2-5 fusion 2018, right knee pain, HTN, 2018 CVA with right sided weakness/reduced sensation in  right UE   PRECAUTIONS: None  SUBJECTIVE:  11/04/2021 States she is doing well and on Sunday she could feel her whole leg, today her foot is numb and the lower part of her leg has some numbness. Overall feels 60% better  Eval:States that she has been having some partial numbness in her feet with the right>left and it is on the top of the foot. States she got up about 3 weeks ago and she was about to change the bed sheets and her leg seemed to get numb so she went to the ED and they cleared her. States that she followed up with Dr. Trenton Gammon and he said everything looks good on the MRI. States he said it could be inflammation causing the numbness. States she has been on prednisone with no change in symptoms. Since all this started the numbness has stayed about the same.   Since all of this started she has started using the rollator, previously using the cane. States she is fearful of driving with her right foot as it is numb.   PAIN:  Are you having pain? Yes: NPRS scale: 7(numbness)/10 Pain location: top of feet and shins Pain description: numbness, ache Aggravating factors: standing, unsure Relieving factors: rest  OBJECTIVE:    DIAGNOSTIC FINDINGS:  MRI Aug 2023  IMPRESSION: 1. Interval posterior decompression and PLIF from L2-L3 to L4-L5 without  residual stenosis. 2. 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 3. Progressive adjacent segment disease at L5-S1 without stenosis.         COGNITION:           Overall cognitive status: Within functional limits for tasks assessed                          SENSATION: reduced sensation in right lower leg - entire lower leg to light touch and pain   EDEMA:    Bilateral with pitting edema on left 3+, no pitting edema on right     POSTURE: rounded shoulders, forward head, and posterior pelvic tilt                  LE Measurements       Lower Extremity  Right 11/04/21 Left 11/04/2021    A/PROM MMT A/PROM MMT  Hip Flexion   4+   4+  Hip Extension          Hip Abduction          Hip Adduction          Hip Internal rotation          Hip External rotation          Knee Flexion   4   4  Knee Extension   4+   4+  Ankle Dorsiflexion   4   4  Ankle Plantarflexion          Ankle Inversion          Ankle Eversion           (Blank rows = not tested)            * pain         TODAY'S TREATMENT: 11/04/2021 TE:  Seated:2#  on ankles LAQs, 3x10 B SAQs 3x15 B, Marching 2x15 B, STS x25 focus on R LE  Standing: hip abd 3x10 B 2 hand support, hip extension 2 hand support 3x10 B, mini squats with 2 hand support 3x6, walking with rollator and 2# ankle weights 500 feet  Aerobic: bike 6 minutes L2 PATIENT EDUCATION:  Education details: on HEP Person educated: Patient Education method: Consulting civil engineer, Media planner, and Handouts Education comprehension: verbalized understanding   HOME EXERCISE PROGRAM: [id number]   ASSESSMENT:   CLINICAL IMPRESSION:  11/04/2021  Continued to progress exercises as tolerated. No pain or increase in numbness noted on this date. Overall patient is doing very well. Fatigue in legs noted end of session. Will continue with current POC as tolerated.      OBJECTIVE IMPAIRMENTS Abnormal gait, decreased activity tolerance, decreased balance, decreased knowledge of use of DME, difficulty walking, decreased ROM, decreased strength, impaired sensation, postural dysfunction, and pain.    ACTIVITY LIMITATIONS standing, stairs, and transfers   PARTICIPATION LIMITATIONS: driving and community activity   PERSONAL FACTORS Age, Fitness, and 1-2 comorbidities: hx of CVA  are also affecting patient's functional outcome.    REHAB POTENTIAL: Good   CLINICAL DECISION MAKING: Evolving/moderate complexity   EVALUATION COMPLEXITY: Moderate   GOALS: Goals reviewed with patient?  yes   SHORT TERM GOALS:   Patient will be  independent in self management strategies to improve quality of life and functional outcomes. Baseline: new program Target date: 10/22/2021 Goal status: MET   2.  Patient will report at least 50% improvement in overall symptoms and/or function to  demonstrate improved functional mobility Baseline: 0% Target date: 10/22/2021 Goal status: MET   3.  Patient will be able to walk/stand for at least 15 minutes to improve ability to make a meal. Baseline: very difficult/unable Target date: 10/22/2021 Goal status: MET         LONG TERM GOALS:   Patient will report at least 75% improvement in overall symptoms and/or function to demonstrate improved functional mobility Baseline: 0% Target date: 12/03/2021 Goal status:PROGRESSING   2.  Patient will be able to ascend and descend steps with railing and step to gait pattern if needed to improve ability to go up and down stairs. Baseline: unable Target date: 12/03/2021 Goal status: PROGRESSING   3.  Patient will be able to walk with cane at least 150 feet without concerns of falling to improve community mobility. Baseline: unable Target date: 12/03/2021 Goal status: PROGRESSING able to do but fearful outside     PLAN: PT FREQUENCY: 1-2x/week for total of 16 visits over 12 week certification   PT DURATION: 12 weeks   PLANNED INTERVENTIONS: Therapeutic exercises, Therapeutic activity, Neuromuscular re-education, Balance training, Gait training, Patient/Family education, Self Care, Joint mobilization, Stair training, Vestibular training, Aquatic Therapy, Dry Needling, Electrical stimulation, Cryotherapy, Moist heat, Ultrasound, Ionotophoresis 19m/ml Dexamethasone, and Manual therapy   PLAN FOR NEXT SESSION: balance, LE strengthening       2:52 PM, 11/04/21 MJerene Pitch DPT Physical Therapy with CRoyston Sinner

## 2021-11-06 ENCOUNTER — Encounter: Payer: Medicare Other | Admitting: Physical Therapy

## 2021-11-06 DIAGNOSIS — M4310 Spondylolisthesis, site unspecified: Secondary | ICD-10-CM | POA: Diagnosis not present

## 2021-11-11 ENCOUNTER — Ambulatory Visit: Payer: Medicare Other | Admitting: Physical Therapy

## 2021-11-11 ENCOUNTER — Encounter: Payer: Self-pay | Admitting: Physical Therapy

## 2021-11-11 DIAGNOSIS — M6281 Muscle weakness (generalized): Secondary | ICD-10-CM

## 2021-11-11 DIAGNOSIS — R262 Difficulty in walking, not elsewhere classified: Secondary | ICD-10-CM

## 2021-11-11 NOTE — Therapy (Signed)
OUTPATIENT PHYSICAL THERAPY TREATMENT NOTE    Patient Name: [name] MRN: [id number] DOB:[birth year], 81 y.o., female Today's Date: 11/11/2021  PCP: Inda Coke, [place] REFERRING PROVIDER: Inda Coke, PA  END OF SESSION:   PT End of Session - 11/11/21 1347     Visit Number 11    Number of Visits 16    Date for PT Re-Evaluation 12/03/21    Authorization Type BCBS    Progress Note Due on Visit 20    PT Start Time 1350    PT Stop Time 1430    PT Time Calculation (min) 40 min    Activity Tolerance Patient tolerated treatment well    Behavior During Therapy [facility] for tasks assessed/performed             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    HLD (hyperlipidemia)    Melanoma in situ ([place])    Vitamin D deficiency    Past Surgical History:  Procedure Laterality Date   BACK SURGERY     CHOLECYSTECTOMY  2006   IR GASTROSTOMY TUBE MOD SED  11/17/2016   IR GASTROSTOMY TUBE REMOVAL  01/18/2017   S/P Hysterectomy  1990   Patient Active Problem List   Diagnosis Date Noted   Osteoporosis without current pathological fracture 07/04/2019   Anxiety    Anemia 01/22/2017   Benign essential HTN    Dysarthria, post-stroke    Acute blood loss anemia    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   Degenerative spondylolisthesis 06/01/2016   HLD (hyperlipidemia) 06/03/2007   Allergic rhinitis 06/03/2007   VAGINITIS, ATROPHIC 06/03/2007    REFERRING DIAG: R29.898 (ICD-10-CM) - Weakness of both lower extremities M43.10 (ICD-10-CM) - Degenerative spondylolisthesis    THERAPY DIAG:  Muscle weakness (generalized)  Difficulty in walking, not elsewhere classified  Rationale for Evaluation and Treatment Rehabilitation  PERTINENT HISTORY: L2-5 fusion 2018, right knee pain, HTN, 2018 CVA with right sided weakness/reduced sensation in right UE    PRECAUTIONS: None  SUBJECTIVE:  11/11/2021 Reports she feels really good today and decided to arrive with her cane instead of her rollator.   Eval:States that she has been having some partial numbness in her feet with the right>left and it is on the top of the foot. States she got up about 3 weeks ago and she was about to change the bed sheets and her leg seemed to get numb so she went to the ED and they cleared her. States that she followed up with Dr. Trenton Gammon and he said everything looks good on the MRI. States he said it could be inflammation causing the numbness. States she has been on prednisone with no change in symptoms. Since all this started the numbness has stayed about the same.   Since all of this started she has started using the rollator, previously using the cane. States she is fearful of driving with her right foot as it is numb.   PAIN:  Are you having pain? Yes: NPRS scale: 7(numbness)/10 Pain location: top of feet and shins Pain description: numbness, ache Aggravating factors: standing, unsure Relieving factors: rest  OBJECTIVE:    DIAGNOSTIC FINDINGS:  MRI Aug 2023  IMPRESSION: 1. Interval posterior decompression and PLIF from L2-L3 to L4-L5 without residual stenosis. 2. 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 3. Progressive adjacent segment disease at L5-S1 without stenosis.         COGNITION:           Overall cognitive status: Within functional limits for tasks assessed                          SENSATION: reduced sensation in right lower leg - entire lower leg to light touch and pain   EDEMA:    Bilateral with pitting edema on left 3+, no pitting edema on right     POSTURE: rounded shoulders, forward head, and posterior pelvic tilt                  LE Measurements       Lower Extremity Right 11/04/21 Left 11/04/2021    A/PROM MMT A/PROM MMT  Hip Flexion   4+    4+  Hip Extension          Hip Abduction          Hip Adduction          Hip Internal rotation          Hip External rotation          Knee Flexion   4   4  Knee Extension   4+   4+  Ankle Dorsiflexion   4   4  Ankle Plantarflexion          Ankle Inversion          Ankle Eversion           (Blank rows = not tested)            * pain         TODAY'S TREATMENT: 11/11/2021 TE:  Seated:LAQs, 2x15 5" holds B, STS 2x8  Standing: side step 6" step 3x12 B 2 hand support, alternating foot taps 6" step occ UE support 3x10 B Aerobic: L2 7 minutes Neuro: staggered step stance  x4 30"B  holds occ UE support, side stepping 2 hand support 6 minutes, backwards stepping 2 hand support 6 minutes     PATIENT EDUCATION:  Education details: on HEP Person educated: Patient Education method: Explanation, Media planner, and Handouts Education comprehension: verbalized understanding   HOME EXERCISE PROGRAM: [id number]   ASSESSMENT:   CLINICAL IMPRESSION:  11/11/2021  Patient tolerated session well. Continued to focus on standing balance and endurance. Slight increase in sway with balance activities but improved functional endurance. Patient ambulating with single point cane today and with improved gait. Will continue with current POC as tolerated.      OBJECTIVE IMPAIRMENTS Abnormal gait, decreased activity tolerance, decreased balance, decreased knowledge of use of DME, difficulty walking, decreased ROM, decreased strength, impaired sensation, postural dysfunction, and pain.    ACTIVITY LIMITATIONS standing, stairs, and transfers   PARTICIPATION LIMITATIONS: driving and community activity   PERSONAL FACTORS Age, Fitness, and 1-2 comorbidities: hx of CVA  are also affecting patient's functional outcome.    REHAB POTENTIAL: Good   CLINICAL DECISION MAKING: Evolving/moderate complexity   EVALUATION COMPLEXITY: Moderate   GOALS: Goals reviewed with patient?  yes   SHORT TERM GOALS:    Patient will be independent in self management strategies to improve quality of life and functional outcomes. Baseline: new program Target date: 10/22/2021 Goal status: MET   2.  Patient will report at least 50% improvement in overall symptoms and/or function to demonstrate improved functional mobility Baseline: 0% Target date:  10/22/2021 Goal status: MET   3.  Patient will be able to walk/stand for at least 15 minutes to improve ability to make a meal. Baseline: very difficult/unable Target date: 10/22/2021 Goal status: MET         LONG TERM GOALS:   Patient will report at least 75% improvement in overall symptoms and/or function to demonstrate improved functional mobility Baseline: 0% Target date: 12/03/2021 Goal status:PROGRESSING   2.  Patient will be able to ascend and descend steps with railing and step to gait pattern if needed to improve ability to go up and down stairs. Baseline: unable Target date: 12/03/2021 Goal status: PROGRESSING   3.  Patient will be able to walk with cane at least 150 feet without concerns of falling to improve community mobility. Baseline: unable Target date: 12/03/2021 Goal status: PROGRESSING able to do but fearful outside     PLAN: PT FREQUENCY: 1-2x/week for total of 16 visits over 12 week certification   PT DURATION: 12 weeks   PLANNED INTERVENTIONS: Therapeutic exercises, Therapeutic activity, Neuromuscular re-education, Balance training, Gait training, Patient/Family education, Self Care, Joint mobilization, Stair training, Vestibular training, Aquatic Therapy, Dry Needling, Electrical stimulation, Cryotherapy, Moist heat, Ultrasound, Ionotophoresis 11m/ml Dexamethasone, and Manual therapy   PLAN FOR NEXT SESSION: balance, LE strengthening       3:41 PM, 11/11/21 MJerene Pitch DPT Physical Therapy with CRoyston Sinner

## 2021-11-13 ENCOUNTER — Ambulatory Visit: Payer: Medicare Other | Admitting: Physician Assistant

## 2021-11-13 ENCOUNTER — Encounter: Payer: Self-pay | Admitting: Physician Assistant

## 2021-11-13 ENCOUNTER — Ambulatory Visit (INDEPENDENT_AMBULATORY_CARE_PROVIDER_SITE_OTHER): Payer: Medicare Other | Admitting: Physician Assistant

## 2021-11-13 ENCOUNTER — Encounter: Payer: Self-pay | Admitting: Physical Therapy

## 2021-11-13 ENCOUNTER — Ambulatory Visit: Payer: Medicare Other | Admitting: Physical Therapy

## 2021-11-13 VITALS — BP 124/76 | HR 72 | Temp 98.4°F | Ht 60.0 in | Wt 152.0 lb

## 2021-11-13 DIAGNOSIS — E538 Deficiency of other specified B group vitamins: Secondary | ICD-10-CM | POA: Diagnosis not present

## 2021-11-13 DIAGNOSIS — R29898 Other symptoms and signs involving the musculoskeletal system: Secondary | ICD-10-CM

## 2021-11-13 DIAGNOSIS — F419 Anxiety disorder, unspecified: Secondary | ICD-10-CM | POA: Diagnosis not present

## 2021-11-13 DIAGNOSIS — I10 Essential (primary) hypertension: Secondary | ICD-10-CM

## 2021-11-13 DIAGNOSIS — R262 Difficulty in walking, not elsewhere classified: Secondary | ICD-10-CM | POA: Diagnosis not present

## 2021-11-13 DIAGNOSIS — M6281 Muscle weakness (generalized): Secondary | ICD-10-CM | POA: Diagnosis not present

## 2021-11-13 MED ORDER — CYANOCOBALAMIN 1000 MCG/ML IJ SOLN
1000.0000 ug | Freq: Once | INTRAMUSCULAR | Status: AC
  Administered 2021-11-13: 1000 ug via INTRAMUSCULAR

## 2021-11-13 NOTE — Patient Instructions (Signed)
It was great to see you!  Schedule a Medicare Annual Wellness visit with our Health Coach Nurse if you would like  Let's continue monthly B12 injections for 3 months  Follow-up with me in 4 months to recheck BP and B-12 levels.  Take care,  Inda Coke PA-C

## 2021-11-13 NOTE — Therapy (Signed)
OUTPATIENT PHYSICAL THERAPY TREATMENT NOTE  PHYSICAL THERAPY DISCHARGE SUMMARY  Visits from Start of Care: 12  Current functional level related to goals / functional outcomes: All goals met   Remaining deficits: balance   Education / Equipment: See below   Patient agrees to discharge. Patient goals were met. Patient is being discharged due to being pleased with the current functional level.   Patient Name: [name] MRN: [id number] DOB:06-29-1940, 81 y.o., female Today's Date: 11/13/2021  PCP: Inda Coke, PA REFERRING PROVIDER: Inda Coke, PA  END OF SESSION:   PT End of Session - 11/13/21 1300     Visit Number 12    Number of Visits 16    Date for PT Re-Evaluation 12/03/21    Authorization Type BCBS    Progress Note Due on Visit 20    PT Start Time 1303    PT Stop Time 1341    PT Time Calculation (min) 38 min    Activity Tolerance Patient tolerated treatment well    Behavior During Therapy WFL for tasks assessed/performed             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    HLD (hyperlipidemia)    Melanoma in situ ([place])    Vitamin D deficiency    Past Surgical History:  Procedure Laterality Date   BACK SURGERY     CHOLECYSTECTOMY  2006   IR GASTROSTOMY TUBE MOD SED  11/17/2016   IR GASTROSTOMY TUBE REMOVAL  01/18/2017   S/P Hysterectomy  1990   Patient Active Problem List   Diagnosis Date Noted   Osteoporosis without current pathological fracture 07/04/2019   Anxiety    Anemia 01/22/2017   Benign essential HTN    Dysarthria, post-stroke    Acute blood loss anemia    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   Degenerative spondylolisthesis 06/01/2016   HLD (hyperlipidemia) 06/03/2007   Allergic rhinitis 06/03/2007   VAGINITIS, ATROPHIC 06/03/2007    REFERRING DIAG: R29.898 (ICD-10-CM) - Weakness of both  lower extremities M43.10 (ICD-10-CM) - Degenerative spondylolisthesis    THERAPY DIAG:  Muscle weakness (generalized)  Difficulty in walking, not elsewhere classified  Rationale for Evaluation and Treatment Rehabilitation  PERTINENT HISTORY: L2-5 fusion 2018, right knee pain, HTN, 2018 CVA with right sided weakness/reduced sensation in right UE   PRECAUTIONS: None  SUBJECTIVE:  11/13/2021 Reports she is feeling good. States her balance is still off but it wasn't good for a while. States overall she feels about 75-80% better. Numbness is there but not like it was.  Eval:States that she has been having some partial numbness in her feet with the right>left and it is on the top of the foot. States she got up about 3 weeks ago and she was about to change the bed sheets and her leg seemed to get numb so she went to the ED and they cleared her. States that she followed up with Dr. Trenton Gammon and he said everything looks good on the MRI. States he said it could be inflammation causing the numbness. States she has been on prednisone with no change in symptoms. Since all this started the numbness has stayed about the same.   Since all of this started she has started using the rollator, previously using the cane. States she is fearful of driving with her right foot as it  is numb.   PAIN:  Are you having pain? Yes: NPRS scale: 3(numbness)/10 Pain location: top of feet and shins Pain description: numbness, ache Aggravating factors: standing, unsure Relieving factors: rest  OBJECTIVE:    DIAGNOSTIC FINDINGS:  MRI Aug 2023  IMPRESSION: 1. Interval posterior decompression and PLIF from L2-L3 to L4-L5 without residual stenosis. 2. 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 3. Progressive adjacent segment disease at L5-S1 without stenosis.         COGNITION:           Overall cognitive status: Within functional limits  for tasks assessed                          SENSATION: reduced sensation in right lower leg - entire lower leg to light touch and pain   EDEMA:    Bilateral with pitting edema on left 3+, no pitting edema on right     POSTURE: rounded shoulders, forward head, and posterior pelvic tilt                  LE Measurements       Lower Extremity Right 11/04/21 Left 11/04/2021    A/PROM MMT A/PROM MMT  Hip Flexion   4+   4+  Hip Extension          Hip Abduction          Hip Adduction          Hip Internal rotation          Hip External rotation          Knee Flexion   4   4  Knee Extension   4+   4+  Ankle Dorsiflexion   4   4  Ankle Plantarflexion          Ankle Inversion          Ankle Eversion           (Blank rows = not tested)            * pain         TODAY'S TREATMENT: 11/13/2021 TE:  Seated:  Standing: stairs reciprocal gait 2 hand support 5 minutes total  Aerobic: L2 7 minutes Neuro: staggered step stance  x6 30"B  holds occ UE support, side stepping 2 hand support 5 minutes   PATIENT EDUCATION:  Education details: on HEP, progress made Person educated: Patient Education method: Explanation, Demonstration, and Handouts Education comprehension: verbalized understanding   HOME EXERCISE PROGRAM: [id number]   ASSESSMENT:   CLINICAL IMPRESSION:  11/13/2021  All goals met at this time. Answered all questions and reviewed HEP. Patient continues to have balance difficulties but has strong HEP to continue to work on this. Patient to discharge from PT to HEP at this time secondary to progress made. Answered all questions prior to DC     OBJECTIVE IMPAIRMENTS Abnormal gait, decreased activity tolerance, decreased balance, decreased knowledge of use of DME, difficulty walking, decreased ROM, decreased strength, impaired sensation, postural dysfunction, and pain.    ACTIVITY LIMITATIONS standing, stairs, and transfers   PARTICIPATION LIMITATIONS: driving and  community activity   PERSONAL FACTORS Age, Fitness, and 1-2 comorbidities: hx of CVA  are also affecting patient's functional outcome.    REHAB POTENTIAL: Good   CLINICAL DECISION MAKING: Evolving/moderate complexity   EVALUATION COMPLEXITY: Moderate   GOALS: Goals reviewed with patient?  yes   SHORT TERM GOALS:   Patient will be independent in self management strategies to improve quality of life and functional outcomes. Baseline: new program Target date: 10/22/2021 Goal status: MET   2.  Patient will report at least 50% improvement in overall symptoms and/or function to demonstrate improved functional mobility Baseline: 0% Target date: 10/22/2021 Goal status: MET   3.  Patient will be able to walk/stand for at least 15 minutes to improve ability to make a meal. Baseline: very difficult/unable Target date: 10/22/2021 Goal status: MET         LONG TERM GOALS:   Patient will report at least 75% improvement in overall symptoms and/or function to demonstrate improved functional mobility Baseline: 0% Target date: 12/03/2021 Goal status:MET   2.  Patient will be able to ascend and descend steps with railing and step to gait pattern if needed to improve ability to go up and down stairs. Baseline: unable Target date: 12/03/2021 Goal status: MET   3.  Patient will be able to walk with cane at least 150 feet without concerns of falling to improve community mobility. Baseline: unable Target date: 12/03/2021 Goal status: MET - currently walking in  community with cane     PLAN: PT FREQUENCY: 1-2x/week for total of 16 visits over 12 week certification   PT DURATION: 12 weeks   PLANNED INTERVENTIONS: Therapeutic exercises, Therapeutic activity, Neuromuscular re-education, Balance training, Gait training, Patient/Family education, Self Care, Joint mobilization, Stair training, Vestibular training, Aquatic Therapy, Dry Needling, Electrical stimulation, Cryotherapy, Moist  heat, Ultrasound, Ionotophoresis 79m/ml Dexamethasone, and Manual therapy   PLAN FOR NEXT SESSION: balance, LE strengthening       1:56 PM, 11/13/21 MJerene Pitch DPT Physical Therapy with CRoyston Sinner

## 2021-11-13 NOTE — Progress Notes (Signed)
[name] is a 81 y.o. female here for a follow up of a pre-existing problem.  History of Present Illness:   Chief Complaint  Patient presents with   Annual Exam    Not fasting    HPI  HTN Currently taking Losartan 100 mg daily and HCTZ 12.5 mg daily with no complications.  Her home blood pressure log shows average pressures of 115-125 systolic and 70s diastolic. Patient denies chest pain, SOB, blurred vision, dizziness, unusual headaches, lower leg swelling. Patient is  compliant with medication. Denies excessive caffeine intake, stimulant usage, excessive alcohol intake, or increase in salt consumption. BP Readings from Last 3 Encounters:  11/13/21 124/76  10/02/21 (!) 148/87  08/19/21 (!) 140/76   Anxiety Well controlled. Compliant with Zoloft 50mg  daily without any adverse side effects. Denies any SI/HI.  BLE weakness H/o degenerative spondylolisthesis. Followed by Dr. . She completed outpatient PT. She is doing her home exercises. Overall improvement of symptoms. Denies any further falls.  Vitamin B12 deficiency Vitamin B12 of 164 on 08/19/21.  Has had x4 B12 injections. Due for next injection.  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    HLD (hyperlipidemia)    Melanoma in situ (HCC)    Vitamin D deficiency      Social History   Tobacco Use   Smoking status: Never   Smokeless tobacco: Never  Vaping Use   Vaping Use: Never used  Substance Use Topics   Alcohol use: No   Drug use: No    Past Surgical History:  Procedure Laterality Date   BACK SURGERY     CHOLECYSTECTOMY  2006   IR GASTROSTOMY TUBE MOD SED  11/17/2016   IR GASTROSTOMY TUBE REMOVAL  01/18/2017   S/P Hysterectomy  1990    Family History  Problem Relation Age of Onset   Stroke Mother    Hyperlipidemia Mother    Diabetes Mellitus II Mother    Hypertension Mother    Heart  disease Mother    Stroke Father    Hyperlipidemia Father    Hypertension Father    Heart disease Father    Colon cancer Maternal Aunt    Hypertension Son    Anxiety disorder Son     Allergies  Allergen Reactions   Lisinopril Other (See Comments)    Dizziness and felt odd--cough     Current Medications:   Current Outpatient Medications:    alendronate (FOSAMAX) 70 MG tablet, TAKE 1 TABLET(70 MG) BY MOUTH EVERY 7 DAYS WITH A FULL GLASS OF WATER AND ON AN EMPTY STOMACH, Disp: 4 tablet, Rfl: 11   atorvastatin (LIPITOR) 40 MG tablet, TAKE 1 TABLET(40 MG) BY MOUTH DAILY, Disp: 90 tablet, Rfl: 0   clopidogrel (PLAVIX) 75 MG tablet, Take 1 tablet (75 mg total) by mouth daily., Disp: 90 tablet, Rfl: 1   hydrochlorothiazide (HYDRODIURIL) 12.5 MG tablet, Take 1 tablet (12.5 mg total) by mouth daily., Disp: 90 tablet, Rfl: 1   losartan (COZAAR) 100 MG tablet, TAKE 1 TABLET(100 MG) BY MOUTH DAILY, Disp: 90 tablet, Rfl: 1   sertraline (ZOLOFT) 50 MG tablet, TAKE 1 TABLET(50 MG) BY MOUTH DAILY, Disp: 90 tablet, Rfl: 1   Review of Systems:   Review of Systems  Constitutional:  Negative for chills, fever, malaise/fatigue and weight loss.  HENT:  Negative for hearing loss, sinus pain and  sore throat.   Respiratory:  Negative for cough and hemoptysis.   Cardiovascular:  Negative for chest pain, palpitations, leg swelling and PND.  Gastrointestinal:  Negative for abdominal pain, constipation, diarrhea, heartburn, nausea and vomiting.  Genitourinary:  Negative for dysuria, frequency and urgency.  Musculoskeletal:  Negative for back pain, myalgias and neck pain.  Skin:  Negative for itching and rash.  Neurological:  Negative for dizziness, tingling, seizures and headaches.  Endo/Heme/Allergies:  Negative for polydipsia.  Psychiatric/Behavioral:  Negative for depression. The patient is not nervous/anxious.     Vitals:   Vitals:   11/13/21 1110  BP: 124/76  Pulse: 72  Temp: 98.4 F (36.9 C)   TempSrc: Temporal  SpO2: 97%  Weight: 152 lb (68.9 kg)  Height: 5' (1.524 m)     Body mass index is 29.69 kg/m.  Physical Exam:   Physical Exam Vitals and nursing note reviewed.  Constitutional:      General: She is not in acute distress.    Appearance: She is well-developed. She is not ill-appearing or toxic-appearing.  Cardiovascular:     Rate and Rhythm: Normal rate and regular rhythm.     Pulses: Normal pulses.     Heart sounds: Normal heart sounds, S1 normal and S2 normal.  Pulmonary:     Effort: Pulmonary effort is normal.     Breath sounds: Normal breath sounds.  Skin:    General: Skin is warm and dry.  Neurological:     Mental Status: She is alert.     GCS: GCS eye subscore is 4. GCS verbal subscore is 5. GCS motor subscore is 6.  Psychiatric:        Speech: Speech normal.        Behavior: Behavior normal. Behavior is cooperative.     Assessment and Plan:   Benign essential HTN Well controlled Continue losartan 100 mg and hctz 12.5 mg daily Follow-up in 4 months  B12 deficiency B-12 injection today, and monthly for 3 months Follow-up in 4 months for repeat B12  Anxiety Well controlled Continue zoloft 50 mg daily Follow-up in 1 yr, sooner if concerns Denies SI/HI  Weakness of both lower extremities Improving Continue HEP  I,Alexis Herring,acting as a scribe for [employer], PA.,have documented all relevant documentation on the behalf of Inda Coke, PA,as directed by  Inda Coke, PA while in the presence of Inda Coke, [place].  I, Inda Coke, [place], have reviewed all documentation for this visit. The documentation on 11/13/21 for the exam, diagnosis, procedures, and orders are all accurate and complete.  Inda Coke PA-C

## 2021-11-18 ENCOUNTER — Ambulatory Visit (INDEPENDENT_AMBULATORY_CARE_PROVIDER_SITE_OTHER): Payer: Medicare Other

## 2021-11-18 DIAGNOSIS — Z0000 Encounter for general adult medical examination without abnormal findings: Secondary | ICD-10-CM

## 2021-11-18 NOTE — Patient Instructions (Signed)
[name] , Thank you for taking time to come for your Medicare Wellness Visit. I appreciate your ongoing commitment to your health goals. Please review the following plan we discussed and let me know if I can assist you in the future.   These are the goals we discussed:  Goals      Increase physical activity     Would like to return to exercise class at [employer]      Patient Stated     Walk better for exercise         This is a list of the screening recommended for you and due dates:  Health Maintenance  Topic Date Due   Hepatitis C Screening: USPSTF Recommendation to screen - Ages 18-79 yo.  04/11/2022*   COVID-19 Vaccine (4 - Pfizer series) 05/14/2022*   Mammogram  11/14/2022*   Zoster (Shingles) Vaccine (2 of 2) 11/14/2022*   Medicare Annual Wellness Visit  11/19/2022   Tetanus Vaccine  11/18/2031   Pneumonia Vaccine  Completed   Flu Shot  Completed   DEXA scan (bone density measurement)  Completed   HPV Vaccine  Aged Out   Colon Cancer Screening  Discontinued  *Topic was postponed. The date shown is not the original due date.    Advanced directives: Advance directive discussed with you today. I have provided a copy for you to complete at home and have notarized. Once this is complete please bring a copy in to our office so we can scan it into your chart.  Conditions/risks identified: get better walking for exercise   Next appointment: Follow up in one year for your annual wellness visit    Preventive Care 65 Years and Older, Female Preventive care refers to lifestyle choices and visits with your health care provider that can promote health and wellness. What does preventive care include? A yearly physical exam. This is also called an annual well check. Dental exams once or twice a year. Routine eye exams. Ask your health care provider how often you should have your eyes checked. Personal lifestyle choices, including: Daily care of your teeth and gums. Regular  physical activity. Eating a healthy diet. Avoiding tobacco and drug use. Limiting alcohol use. Practicing safe sex. Taking low-dose aspirin every day. Taking vitamin and mineral supplements as recommended by your health care provider. What happens during an annual well check? The services and screenings done by your health care provider during your annual well check will depend on your age, overall health, lifestyle risk factors, and family history of disease. Counseling  Your health care provider may ask you questions about your: Alcohol use. Tobacco use. Drug use. Emotional well-being. Home and relationship well-being. Sexual activity. Eating habits. History of falls. Memory and ability to understand (cognition). Work and work Statistician. Reproductive health. Screening  You may have the following tests or measurements: Height, weight, and BMI. Blood pressure. Lipid and cholesterol levels. These may be checked every 5 years, or more frequently if you are over 71 years old. Skin check. Lung cancer screening. You may have this screening every year starting at age 24 if you have a 30-pack-year history of smoking and currently smoke or have quit within the past 15 years. Fecal occult blood test (FOBT) of the stool. You may have this test every year starting at age 78. Flexible sigmoidoscopy or colonoscopy. You may have a sigmoidoscopy every 5 years or a colonoscopy every 10 years starting at age 33. Hepatitis C blood test. Hepatitis B  blood test. Sexually transmitted disease (STD) testing. Diabetes screening. This is done by checking your blood sugar (glucose) after you have not eaten for a while (fasting). You may have this done every 1-3 years. Bone density scan. This is done to screen for osteoporosis. You may have this done starting at age 57. Mammogram. This may be done every 1-2 years. Talk to your health care provider about how often you should have regular mammograms. Talk  with your health care provider about your test results, treatment options, and if necessary, the need for more tests. Vaccines  Your health care provider may recommend certain vaccines, such as: Influenza vaccine. This is recommended every year. Tetanus, diphtheria, and acellular pertussis (Tdap, Td) vaccine. You may need a Td booster every 10 years. Zoster vaccine. You may need this after age 23. Pneumococcal 13-valent conjugate (PCV13) vaccine. One dose is recommended after age 74. Pneumococcal polysaccharide (PPSV23) vaccine. One dose is recommended after age 43. Talk to your health care provider about which screenings and vaccines you need and how often you need them. This information is not intended to replace advice given to you by your health care provider. Make sure you discuss any questions you have with your health care provider. Document Released: 01/25/2015 Document Revised: 09/18/2015 Document Reviewed: 10/30/2014 Elsevier Interactive Patient Education  2017 [place] Prevention in the Home Falls can cause injuries. They can happen to people of all ages. There are many things you can do to make your home safe and to help prevent falls. What can I do on the outside of my home? Regularly fix the edges of walkways and driveways and fix any cracks. Remove anything that might make you trip as you walk through a door, such as a raised step or threshold. Trim any bushes or trees on the path to your home. Use bright outdoor lighting. Clear any walking paths of anything that might make someone trip, such as rocks or tools. Regularly check to see if handrails are loose or broken. Make sure that both sides of any steps have handrails. Any raised decks and porches should have guardrails on the edges. Have any leaves, snow, or ice cleared regularly. Use sand or salt on walking paths during winter. Clean up any spills in your garage right away. This includes oil or grease  spills. What can I do in the bathroom? Use night lights. Install grab bars by the toilet and in the tub and shower. Do not use towel bars as grab bars. Use non-skid mats or decals in the tub or shower. If you need to sit down in the shower, use a plastic, non-slip stool. Keep the floor dry. Clean up any water that spills on the floor as soon as it happens. Remove soap buildup in the tub or shower regularly. Attach bath mats securely with double-sided non-slip rug tape. Do not have throw rugs and other things on the floor that can make you trip. What can I do in the bedroom? Use night lights. Make sure that you have a light by your bed that is easy to reach. Do not use any sheets or blankets that are too big for your bed. They should not hang down onto the floor. Have a firm chair that has side arms. You can use this for support while you get dressed. Do not have throw rugs and other things on the floor that can make you trip. What can I do in the kitchen? Clean up any spills  right away. Avoid walking on wet floors. Keep items that you use a lot in easy-to-reach places. If you need to reach something above you, use a strong step stool that has a grab bar. Keep electrical cords out of the way. Do not use floor polish or wax that makes floors slippery. If you must use wax, use non-skid floor wax. Do not have throw rugs and other things on the floor that can make you trip. What can I do with my stairs? Do not leave any items on the stairs. Make sure that there are handrails on both sides of the stairs and use them. Fix handrails that are broken or loose. Make sure that handrails are as long as the stairways. Check any carpeting to make sure that it is firmly attached to the stairs. Fix any carpet that is loose or worn. Avoid having throw rugs at the top or bottom of the stairs. If you do have throw rugs, attach them to the floor with carpet tape. Make sure that you have a light switch at the  top of the stairs and the bottom of the stairs. If you do not have them, ask someone to add them for you. What else can I do to help prevent falls? Wear shoes that: Do not have high heels. Have rubber bottoms. Are comfortable and fit you well. Are closed at the toe. Do not wear sandals. If you use a stepladder: Make sure that it is fully opened. Do not climb a closed stepladder. Make sure that both sides of the stepladder are locked into place. Ask someone to hold it for you, if possible. Clearly mark and make sure that you can see: Any grab bars or handrails. First and last steps. Where the edge of each step is. Use tools that help you move around (mobility aids) if they are needed. These include: Canes. Walkers. Scooters. Crutches. Turn on the lights when you go into a dark area. Replace any light bulbs as soon as they burn out. Set up your furniture so you have a clear path. Avoid moving your furniture around. If any of your floors are uneven, fix them. If there are any pets around you, be aware of where they are. Review your medicines with your doctor. Some medicines can make you feel dizzy. This can increase your chance of falling. Ask your doctor what other things that you can do to help prevent falls. This information is not intended to replace advice given to you by your health care provider. Make sure you discuss any questions you have with your health care provider. Document Released: 10/25/2008 Document Revised: 06/06/2015 Document Reviewed: 02/02/2014 Elsevier Interactive Patient Education  2017 [employer].

## 2021-11-18 NOTE — Progress Notes (Signed)
I connected with  [name] on 11/18/21 by a audio enabled telemedicine application and verified that I am speaking with the correct person using two identifiers.  Patient Location: Home  Provider Location: Office/Clinic  I discussed the limitations of evaluation and management by telemedicine. The patient expressed understanding and agreed to proceed.   Subjective:   [name] is a 81 y.o. female who presents for Medicare Annual (Subsequent) preventive examination.  Review of Systems     Cardiac Risk Factors include: advanced age (>17men, >47 women);hypertension;dyslipidemia     Objective:    There were no vitals filed for this visit. There is no height or weight on file to calculate BMI.     11/18/2021    7:53 AM 09/10/2021    9:40 AM 08/14/2021    4:17 PM 10/17/2018    2:27 PM 12/30/2016    2:28 PM 11/03/2016    6:57 PM 11/01/2016    6:00 AM  Advanced Directives  Does Patient Have a Medical Advance Directive? No Yes No No No No No  Would patient like information on creating a medical advance directive? Yes (MAU/Ambulatory/Procedural Areas - Information given)  No - Patient declined Yes (MAU/Ambulatory/Procedural Areas - Information given)  No - Patient declined No - Patient declined    Current Medications (verified) Outpatient Encounter Medications as of 11/18/2021  Medication Sig   alendronate (FOSAMAX) 70 MG tablet TAKE 1 TABLET(70 MG) BY MOUTH EVERY 7 DAYS WITH A FULL GLASS OF WATER AND ON AN EMPTY STOMACH   atorvastatin (LIPITOR) 40 MG tablet TAKE 1 TABLET(40 MG) BY MOUTH DAILY   clopidogrel (PLAVIX) 75 MG tablet Take 1 tablet (75 mg total) by mouth daily.   hydrochlorothiazide (HYDRODIURIL) 12.5 MG tablet Take 1 tablet (12.5 mg total) by mouth daily.   losartan (COZAAR) 100 MG tablet TAKE 1 TABLET(100 MG) BY MOUTH DAILY   sertraline (ZOLOFT) 50 MG tablet TAKE 1 TABLET(50 MG) BY MOUTH DAILY   No facility-administered encounter medications on file  as of 11/18/2021.    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[place])    Vitamin D deficiency    Past Surgical History:  Procedure Laterality Date   BACK SURGERY     CHOLECYSTECTOMY  2006   IR GASTROSTOMY TUBE MOD SED  11/17/2016   IR GASTROSTOMY TUBE REMOVAL  01/18/2017   S/P Hysterectomy  1990   Family History  Problem Relation Age of Onset   Stroke Mother    Hyperlipidemia Mother    Diabetes Mellitus II Mother    Hypertension Mother    Heart disease Mother    Stroke Father    Hyperlipidemia Father    Hypertension Father    Heart disease Father    Colon cancer Maternal Aunt    Hypertension Son    Anxiety disorder Son    Social History   Socioeconomic History   Marital status: Widowed    Spouse name: Not on file   Number of children: Not on file   Years of education: Not on file   Highest education level: Not on file  Occupational History   Not on file  Tobacco Use   Smoking status: Never   Smokeless tobacco: Never  Vaping Use   Vaping  Use: Never used  Substance and Sexual Activity   Alcohol use: No   Drug use: No   Sexual activity: Never  Other Topics Concern   Not on file  Social History Narrative   Not on file   Social Determinants of Health   Financial Resource Strain: Low Risk  (11/18/2021)   Overall Financial Resource Strain (CARDIA)    Difficulty of Paying Living Expenses: Not hard at all  Food Insecurity: No Food Insecurity (11/18/2021)   Hunger Vital Sign    Worried About Running Out of Food in the Last Year: Never true    Ran Out of Food in the Last Year: Never true  Transportation Needs: No Transportation Needs (11/18/2021)   PRAPARE - Administrator, Civil Service (Medical): No    Lack of Transportation (Non-Medical): No  Physical Activity:  Insufficiently Active (11/18/2021)   Exercise Vital Sign    Days of Exercise per Week: 5 days    Minutes of Exercise per Session: 20 min  Stress: No Stress Concern Present (11/18/2021)   [employer] of Occupational Health - Occupational Stress Questionnaire    Feeling of Stress : Not at all  Social Connections: Moderately Isolated (11/18/2021)   Social Connection and Isolation Panel [NHANES]    Frequency of Communication with Friends and Family: More than three times a week    Frequency of Social Gatherings with Friends and Family: Three times a week    Attends Religious Services: 1 to 4 times per year    Active Member of Clubs or Organizations: No    Attends Banker Meetings: Never    Marital Status: Widowed    Tobacco Counseling Counseling given: Not Answered   Clinical Intake:  Pre-visit preparation completed: Yes  Pain : No/denies pain     BMI - recorded: 29.69 Nutritional Status: BMI 25 -29 Overweight Nutritional Risks: None Diabetes: No  How often do you need to have someone help you when you read instructions, pamphlets, or other written materials from your doctor or pharmacy?: 1 - Never  Diabetic?no  Interpreter Needed?: No  Information entered by :: Lanier Ensign, LPN   Activities of Daily Living    11/18/2021    7:55 AM 11/17/2021    6:58 PM  In your present state of health, do you have any difficulty performing the following activities:  Hearing? 0 0  Vision? 0 0  Difficulty concentrating or making decisions? 0 0  Walking or climbing stairs? 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 0 0  Doing errands, shopping? 0 0  Preparing Food and eating ? N N  Using the Toilet? N N  In the past six months, have you accidently leaked urine? N N  Do you have problems with loss of bowel control? N N  Managing your Medications? N N  Managing your Finances? N N  Housekeeping or managing your Housekeeping? N N     Patient Care Team: Jarold Motto, [place] as PCP - General (Physician Assistant)  Indicate any recent Medical Services you may have received from other than [facility] providers in the past year (date may be approximate).     Assessment:   This is a routine wellness examination for [name].  Hearing/Vision screen Hearing Screening - Comments:: Pt denies any hearing issues  Vision Screening - Comments:: Pt follows up with Dr Lucina Mellow for annual eye exams   Dietary issues and exercise activities discussed: Current Exercise Habits:  Home exercise routine, Type of exercise: walking;stretching, Time (Minutes): 20, Frequency (Times/Week): 5, Weekly Exercise (Minutes/Week): 100   Goals Addressed             This Visit's Progress    Patient Stated       Walk better for exercise        Depression Screen    11/18/2021    7:51 AM 11/13/2021   11:10 AM 09/24/2020   10:28 AM 10/17/2018    2:28 PM 01/22/2017    2:05 PM  PHQ 2/9 Scores  PHQ - 2 Score 0 0 0 0 0  Exception Documentation     Medical reason    Fall Risk    11/18/2021    7:54 AM 11/17/2021    6:58 PM 11/13/2021   11:10 AM 09/24/2020   10:28 AM 10/17/2018    2:28 PM  Fall Risk   Falls in the past year? 1 0 0 1 1  Number falls in past yr: 1 0 0 0 0  Injury with Fall? 1 0 0 1 0  Comment bruised left  knee and arm      Risk for fall due to : Impaired vision;Impaired balance/gait    Impaired balance/gait  Follow up Falls prevention discussed  Falls evaluation completed  Education provided;Falls prevention discussed;Falls evaluation completed    FALL RISK PREVENTION PERTAINING TO THE HOME:  Any stairs in or around the home? No  If so, are there any without handrails? No  Home free of loose throw rugs in walkways, pet beds, electrical cords, etc? Yes  Adequate lighting in your home to reduce risk of falls? Yes   ASSISTIVE DEVICES UTILIZED TO PREVENT FALLS:  Life alert? No  Use of a cane, walker or w/c? Yes  Grab bars in the  bathroom? Yes  Shower chair or bench in shower? Yes  Elevated toilet seat or a handicapped toilet? No   TIMED UP AND GO:  Was the test performed? No .   Cognitive Function:Declined note below         10/17/2018    2:29 PM  6CIT Screen  What Year? 0 points  What month? 0 points  What time? 0 points  Count back from 20 0 points  Months in reverse 0 points    Immunizations Immunization History  Administered Date(s) Administered   Fluad Quad(high Dose 65+) 11/03/2019, 09/24/2020, 10/07/2021   Influenza Whole 09/09/2009, 10/13/2017   Influenza, High Dose Seasonal PF 12/02/2019   PFIZER(Purple Top)SARS-COV-2 Vaccination 02/24/2019, 03/17/2019, 12/02/2019   PNEUMOCOCCAL CONJUGATE-20 11/17/2021   Pneumococcal Conjugate-13 05/18/2013   Pneumococcal Polysaccharide-23 11/04/2018   Td 11/17/2021   Zoster Recombinat (Shingrix) 11/04/2018   Zoster, Live 05/18/2013    TDAP status: Up to date  Flu Vaccine status: Up to date  Pneumococcal vaccine status: Up to date  Covid-19 vaccine status: Completed vaccines  Qualifies for Shingles Vaccine? Yes   Zostavax completed Yes   Shingrix Completed?: Yes  Screening Tests Health Maintenance  Topic Date Due   Hepatitis C Screening  04/11/2022 (Originally 09/15/1958)   COVID-19 Vaccine (4 - Pfizer series) 05/14/2022 (Originally 01/27/2020)   MAMMOGRAM  11/14/2022 (Originally 12/11/2016)   Zoster Vaccines- Shingrix (2 of 2) 11/14/2022 (Originally 12/30/2018)   Medicare Annual Wellness (AWV)  11/19/2022   TETANUS/TDAP  11/18/2031   Pneumonia Vaccine 4+ Years old  Completed   INFLUENZA VACCINE  Completed   DEXA SCAN  Completed   HPV VACCINES  Aged Out   COLONOSCOPY (  Pts 45-1yrs Insurance coverage will need to be confirmed)  Discontinued    Health Maintenance  There are no preventive care reminders to display for this patient.   Colorectal cancer screening: No longer required.   Mammogram status: Completed 12/12/14. Repeat every  year pt sated she will follow up in spring   Bone Density status: Completed 05/02/19. Results reflect: Bone density results: OSTEOPOROSIS. Repeat every 2 years.   Additional Screening:  Hepatitis C Screening: does qualify;  Vision Screening: Recommended annual ophthalmology exams for early detection of glaucoma and other disorders of the eye. Is the patient up to date with their annual eye exam?  Yes  Who is the provider or what is the name of the office in which the patient attends annual eye exams? Dr Lucina Mellow If pt is not established with a provider, would they like to be referred to a provider to establish care? No .   Dental Screening: Recommended annual dental exams for proper oral hygiene  Community Resource Referral / Chronic Care Management: CRR required this visit?  No   CCM required this visit?  No      Plan:     I have personally reviewed and noted the following in the patient's chart:   Medical and social history Use of alcohol, tobacco or illicit drugs  Current medications and supplements including opioid prescriptions. Patient is not currently taking opioid prescriptions. Functional ability and status Nutritional status Physical activity Advanced directives List of other physicians Hospitalizations, surgeries, and ER visits in previous 12 months Vitals Screenings to include cognitive, depression, and falls Referrals and appointments  In addition, I have reviewed and discussed with patient certain preventive protocols, quality metrics, and best practice recommendations. A written personalized care plan for preventive services as well as general preventive health recommendations were provided to patient.     Marzella Schlein, LPN   21/01/9415   Nurse Notes:  Pt stated she get to nervous with cognitive testing declined at this time, Pt was alert and orient with knowledge of questions asked.

## 2021-12-11 ENCOUNTER — Telehealth: Payer: Self-pay | Admitting: Physician Assistant

## 2021-12-11 ENCOUNTER — Ambulatory Visit (INDEPENDENT_AMBULATORY_CARE_PROVIDER_SITE_OTHER): Payer: Medicare Other

## 2021-12-11 DIAGNOSIS — E538 Deficiency of other specified B group vitamins: Secondary | ICD-10-CM | POA: Diagnosis not present

## 2021-12-11 MED ORDER — CYANOCOBALAMIN 1000 MCG/ML IJ SOLN
1000.0000 ug | Freq: Once | INTRAMUSCULAR | Status: AC
  Administered 2021-12-11: 1000 ug via INTRAMUSCULAR

## 2021-12-11 NOTE — Telephone Encounter (Signed)
Patient had last b12 today per her knowledge- would like to know if she should come back for more b12 or proceed with labs if so when-

## 2021-12-11 NOTE — Progress Notes (Signed)
Patient was given the b12 injection today. Patient tolerated injection well. Cerra Eisenhower S Mihir Flanigan, CMA  

## 2021-12-11 NOTE — Telephone Encounter (Signed)
Please see message and advise 

## 2021-12-12 NOTE — Telephone Encounter (Signed)
Left detailed message on personal voicemail, [name] said you can do one more B-12 injection in January and then we will recheck your levels in March at your appt. Please call the office to schedule Nurse visit for B-12 injection in Jan. Any questions please call office.

## 2021-12-15 ENCOUNTER — Other Ambulatory Visit: Payer: Self-pay | Admitting: Physician Assistant

## 2021-12-15 NOTE — Telephone Encounter (Signed)
Spoke to pt told her calling to make sure she got my message I left last week about B-12 injection. Pt said no. Told her [name] said okay to one more B-12 Injection in Jan and we will re-check your levels at March appt just need to schedule Nurse visit appt. Pt verbalized understanding.

## 2022-01-04 ENCOUNTER — Emergency Department (HOSPITAL_BASED_OUTPATIENT_CLINIC_OR_DEPARTMENT_OTHER)
Admission: EM | Admit: 2022-01-04 | Discharge: 2022-01-04 | Disposition: A | Discharge status: Home / Self Care | Payer: Medicare Other | Attending: Emergency Medicine | Admitting: Emergency Medicine

## 2022-01-04 ENCOUNTER — Emergency Department (HOSPITAL_BASED_OUTPATIENT_CLINIC_OR_DEPARTMENT_OTHER): Payer: Medicare Other | Admitting: Radiology

## 2022-01-04 DIAGNOSIS — S62314A Displaced fracture of base of fourth metacarpal bone, right hand, initial encounter for closed fracture: Secondary | ICD-10-CM | POA: Diagnosis not present

## 2022-01-04 DIAGNOSIS — W06XXXA Fall from bed, initial encounter: Secondary | ICD-10-CM | POA: Diagnosis not present

## 2022-01-04 DIAGNOSIS — S62342A Nondisplaced fracture of base of third metacarpal bone, right hand, initial encounter for closed fracture: Secondary | ICD-10-CM

## 2022-01-04 DIAGNOSIS — S0083XA Contusion of other part of head, initial encounter: Secondary | ICD-10-CM | POA: Diagnosis not present

## 2022-01-04 DIAGNOSIS — S62354A Nondisplaced fracture of shaft of fourth metacarpal bone, right hand, initial encounter for closed fracture: Secondary | ICD-10-CM | POA: Diagnosis not present

## 2022-01-04 DIAGNOSIS — Z7902 Long term (current) use of antithrombotics/antiplatelets: Secondary | ICD-10-CM | POA: Diagnosis not present

## 2022-01-04 DIAGNOSIS — S60921A Unspecified superficial injury of right hand, initial encounter: Secondary | ICD-10-CM | POA: Diagnosis present

## 2022-01-04 NOTE — ED Triage Notes (Addendum)
Pt fell out of bed yesterday, landing on carpet floor, injuring right orbital face and right hand.  Pt states eye injury is not bothersome, mild bruising noted, but c/o hand pain and swelling.  Pt does take blood thinners, visible swelling noted to right hand, denies headache, vision changes, or any other symptoms beside mild hand pain and swelling.

## 2022-01-04 NOTE — ED Provider Notes (Signed)
[facility] EMERGENCY DEPT Provider Note   CSN: [id number] Arrival date & time: 01/04/22  1114     History  Chief Complaint  Patient presents with   Marletta Lor    [name] is a 81 y.o. female on Plavix presenting to the ED for a mechanical fall yesterday out of her bed.  She reports it was simply a clumsy accident.  She fell on her outstretched right hand, and reports that she had a minor frontal head injury as well, said her glasses hit into her forehead.  She reports pain in her right hand only, denies headache.  Denies loss of consciousness.  She is here with her son at the bedside  HPI     Home Medications Prior to Admission medications   Medication Sig Start Date End Date Taking? Authorizing Provider  alendronate (FOSAMAX) 70 MG tablet TAKE 1 TABLET(70 MG) BY MOUTH EVERY 7 DAYS WITH A FULL GLASS OF WATER AND ON AN EMPTY STOMACH 09/02/21   Jarold Motto, PA  atorvastatin (LIPITOR) 40 MG tablet TAKE 1 TABLET(40 MG) BY MOUTH DAILY 12/15/21   Jarold Motto, PA  clopidogrel (PLAVIX) 75 MG tablet Take 1 tablet (75 mg total) by mouth daily. 09/26/21   Jarold Motto, PA  hydrochlorothiazide (HYDRODIURIL) 12.5 MG tablet TAKE 1 TABLET(12.5 MG) BY MOUTH DAILY 12/15/21   Jarold Motto, PA  losartan (COZAAR) 100 MG tablet TAKE 1 TABLET(100 MG) BY MOUTH DAILY 12/15/21   Jarold Motto, PA  sertraline (ZOLOFT) 50 MG tablet TAKE 1 TABLET(50 MG) BY MOUTH DAILY 12/15/21   Jarold Motto, PA      Allergies    Lisinopril    Review of Systems   Review of Systems  Physical Exam Updated Vital Signs BP (!) 149/87 (BP Location: Left Arm)   Pulse (!) 111   Temp 98.4 F (36.9 C) (Oral)   Resp 16   SpO2 94%  Physical Exam Constitutional:      General: She is not in acute distress. HENT:     Head: Normocephalic.     Comments: Echymoses around right eye Eyes:     Conjunctiva/sclera: Conjunctivae normal.     Pupils: Pupils are equal, round, and reactive to  light.  Cardiovascular:     Rate and Rhythm: Normal rate and regular rhythm.  Pulmonary:     Effort: Pulmonary effort is normal. No respiratory distress.  Abdominal:     General: There is no distension.     Tenderness: There is no abdominal tenderness.  Musculoskeletal:     Comments: Erythema and swelling of the right palm and hand with tenderness along the base of the fourth and third meta carpal, cap refill is brisk  Skin:    General: Skin is warm and dry.  Neurological:     General: No focal deficit present.     Mental Status: She is alert. Mental status is at baseline.  Psychiatric:        Mood and Affect: Mood normal.        Behavior: Behavior normal.     ED Results / Procedures / Treatments   Labs (all labs ordered are listed, but only abnormal results are displayed) Labs Reviewed - No data to display  EKG None  Radiology DG Hand Complete Right  Result Date: 01/04/2022 CLINICAL DATA:  Patient fell out of bed. EXAM: RIGHT HAND - COMPLETE 3+ VIEW COMPARISON:  None Available. FINDINGS: Comminuted spiral fracture noted fourth metacarpal with up to 8-9 mm of bony  over riding and apex posterior angulation. Long oblique fracture noted in the third metacarpal. Degenerative changes evident in the radial carpus and in scattered IP joints. IMPRESSION: Comminuted spiral fracture of the fourth metacarpal diaphysis with up to 8-9 mm of bony over riding and apex posterior angulation. No evidence for extension to articular surface Long oblique fracture of the third metacarpal diaphysis. No articular surface extension evident. Electronically Signed   By: Kennith Center M.D.   On: 01/04/2022 13:29    Procedures Procedures    Medications Ordered in ED Medications - No data to display  ED Course/ Medical Decision Making/ A&P                           Medical Decision Making Amount and/or Complexity of Data Reviewed Radiology: ordered.   Mechanical fall yesterday.  Patient appears  of isolated injury of the forehead and also the right hand.  X-rays of the hand were ordered and personally reviewed and interpreted by myself, showing a spiral fracture of the base of the fourth metacarpal, as well as an oblique fracture through the third metacarpal.  She is otherwise neurovascularly intact.  There is no open fracture.  No significant angulation on exam.  Patient will be placed in a volar splint to immobilize her finger injuries, and will need follow-up with a hand surgeon for this.  She has very minimal pain and in fact almost demonstrates normal usage of her hand in the room.  She is not requiring any type of additional pain medication.  She has an isolated contusion or ecchymosis around her right forehead where she struck the ground.  We did discuss CT imaging, given that she is on Plavix.  She does not want a CT scan at this time.  She understands the risk include missing an intracranial bleed or hemorrhage.  However I think it is reasonable given that has been 24 hours and she has no neurological deficits or headache, and also reports that the injury was isolated to where her glasses struck her forehead.        Final Clinical Impression(s) / ED Diagnoses Final diagnoses:  Closed nondisplaced fracture of base of third metacarpal bone of right hand, initial encounter  Closed displaced fracture of base of fourth metacarpal bone of right hand, initial encounter  Contusion of forehead, initial encounter    Rx / DC Orders ED Discharge Orders     None         Terald Sleeper, MD 01/04/22 1551

## 2022-01-04 NOTE — Discharge Instructions (Addendum)
You have fractures at the bottom of your third and fourth fingers of your right hand.  Please keep the cast dry.  Call on Tuesday to schedule follow-up appointment with a hand surgeon in the office.  You can take Tylenol as needed for pain at home.  He also had an injury to your forehead.  We discussed CT scan in the ED to rule out possible brain bleed, because you are on a blood thinner called Plavix.  You did not want the scan at this time.  If you do develop worsening headaches over the next 2 days, dizziness, or neurological or stroke symptoms, please return immediately to the ER.

## 2022-01-08 DIAGNOSIS — M25641 Stiffness of right hand, not elsewhere classified: Secondary | ICD-10-CM | POA: Diagnosis not present

## 2022-01-08 DIAGNOSIS — M79641 Pain in right hand: Secondary | ICD-10-CM | POA: Diagnosis not present

## 2022-01-08 DIAGNOSIS — S62322A Displaced fracture of shaft of third metacarpal bone, right hand, initial encounter for closed fracture: Secondary | ICD-10-CM | POA: Diagnosis not present

## 2022-01-08 DIAGNOSIS — S62324A Displaced fracture of shaft of fourth metacarpal bone, right hand, initial encounter for closed fracture: Secondary | ICD-10-CM | POA: Diagnosis not present

## 2022-01-13 ENCOUNTER — Telehealth: Payer: Self-pay | Admitting: *Deleted

## 2022-01-13 NOTE — Telephone Encounter (Signed)
     Patient  visit on 01/04/2022  at ed was for treatment  Have you been able to follow up with your primary care physician? yes The patient was or was not able to obtain any needed medicine or equipment.  Are there diet recommendations that you are having difficulty following?  Patient expresses understanding of discharge instructions and education provided has no other needs at this time.   [facility] [phone] [address] , [place] [address] Email : [email or web address]

## 2022-01-22 DIAGNOSIS — S62322A Displaced fracture of shaft of third metacarpal bone, right hand, initial encounter for closed fracture: Secondary | ICD-10-CM | POA: Diagnosis not present

## 2022-01-22 DIAGNOSIS — S62324A Displaced fracture of shaft of fourth metacarpal bone, right hand, initial encounter for closed fracture: Secondary | ICD-10-CM | POA: Diagnosis not present

## 2022-02-12 DIAGNOSIS — S62322A Displaced fracture of shaft of third metacarpal bone, right hand, initial encounter for closed fracture: Secondary | ICD-10-CM | POA: Diagnosis not present

## 2022-02-12 DIAGNOSIS — S62324A Displaced fracture of shaft of fourth metacarpal bone, right hand, initial encounter for closed fracture: Secondary | ICD-10-CM | POA: Diagnosis not present

## 2022-03-12 DIAGNOSIS — S62324A Displaced fracture of shaft of fourth metacarpal bone, right hand, initial encounter for closed fracture: Secondary | ICD-10-CM | POA: Diagnosis not present

## 2022-03-12 DIAGNOSIS — S62322A Displaced fracture of shaft of third metacarpal bone, right hand, initial encounter for closed fracture: Secondary | ICD-10-CM | POA: Diagnosis not present

## 2022-03-17 ENCOUNTER — Ambulatory Visit (INDEPENDENT_AMBULATORY_CARE_PROVIDER_SITE_OTHER): Payer: Medicare Other | Admitting: Physician Assistant

## 2022-03-17 ENCOUNTER — Encounter: Payer: Self-pay | Admitting: Physician Assistant

## 2022-03-17 VITALS — BP 120/70 | HR 92 | Temp 98.0°F | Ht 60.0 in | Wt 149.4 lb

## 2022-03-17 DIAGNOSIS — I10 Essential (primary) hypertension: Secondary | ICD-10-CM

## 2022-03-17 DIAGNOSIS — R200 Anesthesia of skin: Secondary | ICD-10-CM

## 2022-03-17 DIAGNOSIS — R29898 Other symptoms and signs involving the musculoskeletal system: Secondary | ICD-10-CM | POA: Diagnosis not present

## 2022-03-17 DIAGNOSIS — E538 Deficiency of other specified B group vitamins: Secondary | ICD-10-CM | POA: Diagnosis not present

## 2022-03-17 LAB — CK: Total CK: 159 U/L (ref 7–177)

## 2022-03-17 LAB — VITAMIN B12: Vitamin B-12: 341 pg/mL (ref 211–911)

## 2022-03-17 MED ORDER — CYANOCOBALAMIN 1000 MCG/ML IJ SOLN
1000.0000 ug | Freq: Once | INTRAMUSCULAR | Status: AC
  Administered 2022-03-17: 1000 ug via INTRAMUSCULAR

## 2022-03-17 NOTE — Progress Notes (Addendum)
[name] is a 82 y.o. female here for a follow up of a pre-existing problem.  History of Present Illness:   Chief Complaint  Patient presents with   Hypertension    Pt has not been checking Blood pressure at home. Pt denies headaches, dizziness, blurred vision, chest pain, SOB or lower leg edema. Denies excessive caffeine intake, stimulant usage, excessive alcohol intake or increase in salt consumption.      HTN Currently taking Losartan 100 mg daily and HCTZ 12.5 mg. At home blood pressure readings are: well controlled. Patient denies chest pain, SOB, blurred vision, dizziness, unusual headaches, lower leg swelling. Patient is compliant with medication. Denies excessive caffeine intake, stimulant usage, excessive alcohol intake, or increase in salt consumption.  BP Readings from Last 3 Encounters:  03/17/22 120/70  01/04/22 (!) 149/87  11/13/21 124/76   B12 deficiency She has missed a few of her B12 injections due to busy schedule. She would like to resume them. She continues to have numbness in her right leg.  Numbness in right leg and weakness in both lower extremities She has had ongoing issues and has been unable to drive due to right leg numbness. We have completed an MRI in August and recommended follow-up with Dr. Trenton Gammon. She followed up with Dr. Trenton Gammon and was told basically there was nothing from his standpoint for her to do. We did do a round of physical therapy and she did not feel like this improved her symptoms.  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    HLD (hyperlipidemia)    Melanoma in situ (HCC)    Vitamin D deficiency      Social History   Tobacco Use   Smoking status: Never   Smokeless tobacco: Never  Vaping Use   Vaping Use: Never used  Substance Use Topics   Alcohol use: No   Drug use: No    Past Surgical History:  Procedure  Laterality Date   BACK SURGERY     CHOLECYSTECTOMY  2006   IR GASTROSTOMY TUBE MOD SED  11/17/2016   IR GASTROSTOMY TUBE REMOVAL  01/18/2017   S/P Hysterectomy  1990    Family History  Problem Relation Age of Onset   Stroke Mother    Hyperlipidemia Mother    Diabetes Mellitus II Mother    Hypertension Mother    Heart disease Mother    Stroke Father    Hyperlipidemia Father    Hypertension Father    Heart disease Father    Colon cancer Maternal Aunt    Hypertension Son    Anxiety disorder Son     Allergies  Allergen Reactions   Lisinopril Other (See Comments)    Dizziness and felt odd--cough     Current Medications:   Current Outpatient Medications:    alendronate (FOSAMAX) 70 MG tablet, TAKE 1 TABLET(70 MG) BY MOUTH EVERY 7 DAYS WITH A FULL GLASS OF WATER AND ON AN EMPTY STOMACH, Disp: 4 tablet, Rfl: 11   atorvastatin (LIPITOR) 40 MG tablet, TAKE 1 TABLET(40 MG) BY MOUTH DAILY, Disp: 90 tablet, Rfl: 0   clopidogrel (PLAVIX) 75 MG tablet, Take 1 tablet (75 mg total) by mouth daily., Disp: 90 tablet, Rfl: 1   hydrochlorothiazide (HYDRODIURIL) 12.5 MG tablet, TAKE 1 TABLET(12.5 MG) BY MOUTH DAILY, Disp: 90 tablet, Rfl: 1   losartan (COZAAR) 100 MG  tablet, TAKE 1 TABLET(100 MG) BY MOUTH DAILY, Disp: 90 tablet, Rfl: 1   sertraline (ZOLOFT) 50 MG tablet, TAKE 1 TABLET(50 MG) BY MOUTH DAILY, Disp: 90 tablet, Rfl: 1   Review of Systems:   ROS Negative unless otherwise specified per HPI.  Vitals:   Vitals:   03/17/22 1032  BP: 120/70  Pulse: 92  Temp: 98 F (36.7 C)  TempSrc: Temporal  SpO2: 98%  Weight: 149 lb 6.1 oz (67.8 kg)  Height: 5' (1.524 m)     Body mass index is 29.17 kg/m.  Physical Exam:   Physical Exam Vitals and nursing note reviewed.  Constitutional:      General: She is not in acute distress.    Appearance: She is well-developed. She is not ill-appearing or toxic-appearing.  Cardiovascular:     Rate and Rhythm: Normal rate and regular rhythm.      Pulses: Normal pulses.     Heart sounds: Normal heart sounds, S1 normal and S2 normal.  Pulmonary:     Effort: Pulmonary effort is normal.     Breath sounds: Normal breath sounds.  Skin:    General: Skin is warm and dry.  Neurological:     Mental Status: She is alert.     GCS: GCS eye subscore is 4. GCS verbal subscore is 5. GCS motor subscore is 6.  Psychiatric:        Speech: Speech normal.        Behavior: Behavior normal. Behavior is cooperative.     Assessment and Plan:   B12 deficiency Update B12 today and plan to continue B12 injections until we get levels > than 400  Benign essential HTN Normotensive today Continue losartan 100 mg daily and HCTZ 12.5 mg  Follow-up in 6 months, sooner if concerns  Numbness in right leg;Weakness of both lower extremities Ongoing No new red flags Offered referral to neurology but she declined Maximize B12 to get to goal Referral to Dr. Lynne Leader for further evaluation   Inda Coke, PA-C

## 2022-03-17 NOTE — Patient Instructions (Signed)
It was great to see you!  Blood pressure is at goal -- no changes today  We will check your B12 today to see if it has changed at all and also give you a B12 injection  I have placed Referral to Dr Georgina Snell for you to see him about your leg numbness  Take care,  Inda Coke PA-C

## 2022-03-22 ENCOUNTER — Other Ambulatory Visit: Payer: Self-pay | Admitting: Physician Assistant

## 2022-03-23 NOTE — Progress Notes (Unsigned)
   Shirlyn Goltz, PhD, LAT, ATC acting as a scribe for Lynne Leader, MD.  [name] is a 82 y.o. female who presents to [facility] today for R leg pain. Pt was seen previously by Dr. Georgina Snell on 11/11/20 for R knee pain and was given a R knee steroid injection.  Today, pt c/o R leg pain x ***. Pt locates pain to ***  Low back pain: Radiating pain: LE numbness/tingling: yes LE weakness: Aggravates: Treatments tried: prior PT completing 12 visits (d/c on 11/13/21),   Dx imaging: 08/20/21 L-spine MRI 11/01/20 R knee XR             03/26/16 Bilat LE vascular US  Pertinent review of systems: ***  Relevant historical information: ***   Exam:  There were no vitals taken for this visit. General: Well Developed, well nourished, and in no acute distress.   MSK: ***    Lab and Radiology Results No results found for this or any previous visit (from the past 72 hour(s)). No results found.     Assessment and Plan: 82 y.o. female with ***   PDMP not reviewed this encounter. No orders of the defined types were placed in this encounter.  No orders of the defined types were placed in this encounter.    Discussed warning signs or symptoms. Please see discharge instructions. Patient expresses understanding.   ***

## 2022-03-24 ENCOUNTER — Ambulatory Visit: Payer: Medicare Other | Admitting: Family Medicine

## 2022-03-24 ENCOUNTER — Encounter: Payer: Self-pay | Admitting: Family Medicine

## 2022-03-24 VITALS — BP 150/82 | HR 106 | Ht 60.0 in | Wt 156.0 lb

## 2022-03-24 DIAGNOSIS — R200 Anesthesia of skin: Secondary | ICD-10-CM | POA: Diagnosis not present

## 2022-03-24 DIAGNOSIS — R202 Paresthesia of skin: Secondary | ICD-10-CM

## 2022-03-24 NOTE — Patient Instructions (Addendum)
Thank you for coming in today.   You should hear from neurology soon about the nerve test.   Return to my clinic after we get the results back to talk about what we are going to do about.

## 2022-03-31 ENCOUNTER — Other Ambulatory Visit: Payer: Self-pay | Admitting: Physician Assistant

## 2022-03-31 ENCOUNTER — Encounter: Payer: Self-pay | Admitting: Neurology

## 2022-04-09 DIAGNOSIS — S62324A Displaced fracture of shaft of fourth metacarpal bone, right hand, initial encounter for closed fracture: Secondary | ICD-10-CM | POA: Diagnosis not present

## 2022-04-09 DIAGNOSIS — S62322A Displaced fracture of shaft of third metacarpal bone, right hand, initial encounter for closed fracture: Secondary | ICD-10-CM | POA: Diagnosis not present

## 2022-05-11 ENCOUNTER — Ambulatory Visit: Payer: Medicare Other | Admitting: Neurology

## 2022-05-11 DIAGNOSIS — R202 Paresthesia of skin: Secondary | ICD-10-CM

## 2022-05-11 DIAGNOSIS — M5417 Radiculopathy, lumbosacral region: Secondary | ICD-10-CM

## 2022-05-11 DIAGNOSIS — R200 Anesthesia of skin: Secondary | ICD-10-CM

## 2022-05-11 NOTE — Procedures (Signed)
[facility]  [address] [place], [address]  [place], [place] [address] Tel: [phone] Fax: [phone] Test Date:  05/11/2022  Patient: [name] DOB: July 23, 1940 Physician: Jacquelyne Balint, MD  Sex: Female Height: 5\' 0"  Ref Phys: Earma Reading, MD  ID#: [id number]   Technician:    History: This is a 82 year old female with right leg pain and numbness.  NCV & EMG Findings: Extensive electrodiagnostic evaluation of the right lower limb with additional needle examination of the left lower limb shows: Right sural and superficial peroneal/fibular sensory responses are within normal limits. Right peroneal/fibular (EDB) motor responses shows reduced amplitude (1.39 mV). Right peroneal/fibular (TA) and tibial (AH) motor responses are within normal limits. Right H reflex is absent. Chronic motor axon loss changes WITHOUT accompanying active denervation changes are seen in bilateral tibialis anterior, right medial head of gastrocnemius, bilateral flexor digitorum longus, bilateral short head of biceps femoris, and right gluteus medius muscles. Chronic motor axon loss changes WITH active denervation changes are seen in the left medial head of gastrocnemius muscle.  Impression: This is an abnormal study. 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 An active/ongoing (or incompletely reinnervated) intraspinal canal lesion (ie: motor radiculopathy) at the left S1 root or segment, moderate in degree electrically. No electrodiagnostic evidence of a large fiber sensorimotor neuropathy. Absent H reflex may be normal in a patient of this age.    ___________________________ Jacquelyne Balint, MD    Nerve Conduction Studies Motor Nerve Results    Latency Amplitude F-Lat Segment Distance CV Comment  Site (ms) Norm (mV) Norm (ms)  (cm) (m/s)  Norm   Right Fibular (EDB) Motor  Ankle 3.5  < 6.0 *1.39  > 2.5        Bel fib head 9.3 - 1.24 -  Bel fib head-Ankle 29 50  > 40   Pop fossa 11.2 - 1.22 -  Pop fossa-Bel fib head 9 47 -   Right Fibular (TA) Motor  Fib head 2.0  < 4.5 3.1  > 3.0        Pop fossa 3.7  < 6.7 3.0 -  Pop fossa-Fib head 9 53  > 40   Right Tibial (AH) Motor  Ankle 3.9  < 6.0 4.7  > 4.0        Knee 11.3 - 4.3 -  Knee-Ankle 37 50  > 40    Sensory Sites    Neg Peak Lat Amplitude (O-P) Segment Distance Velocity Comment  Site (ms) Norm (V) Norm  (cm) (ms)   Right Superficial Fibular Sensory  14 cm-Ankle 2.8  < 4.6 4  > 3 14 cm-Ankle 14    Right Sural Sensory  Calf-Lat mall 3.1  < 4.6 9  > 3 Calf-Lat mall 14     H-Reflex Results    M-Lat H Lat H Neg Amp H-M Lat  Site (ms) (ms) Norm (mV) (ms)  Right Tibial H-Reflex  Pop fossa 5.1 ---  < 35.0 --- ---   Electromyography   Side Muscle Ins.Act Fibs Fasc Recrt Amp Dur Poly Activation Comment  Right Tib ant Nml Nml Nml *2- *1+ *1+ *1+ Nml N/A  Right Gastroc MH Nml Nml Nml Nml Nml Nml Nml Nml N/A  Right FDL Nml Nml Nml *2- *1+ *1+ *1+ Nml N/A  Right Rectus fem Nml Nml Nml Nml Nml  Nml Nml Nml N/A  Right Biceps fem SH Nml Nml Nml Nml Nml Nml Nml Nml N/A  Right Gluteus med Nml Nml Nml *2- *1+ *1+ *2+ Nml N/A  Left Tib ant Nml Nml Nml *2- *1+ *1+ *1+ Nml N/A  Left Gastroc MH Nml *1+ Nml *2- *1+ *1+ *1+ Nml N/A  Left FDL Nml Nml Nml Nml Nml Nml Nml Nml N/A  Left Biceps fem SH Nml Nml Nml *2- *1+ *1+ *1+ Nml N/A      Waveforms:  Motor        Sensory      H-Reflex

## 2022-05-18 NOTE — Progress Notes (Signed)
Nerve conduction studies show evidence of pinched nerve in your back.  Recommend return to clinic to go over the results in full detail and talk about treatment plan and options.

## 2022-05-20 ENCOUNTER — Ambulatory Visit: Payer: Medicare Other | Admitting: Family Medicine

## 2022-05-21 ENCOUNTER — Encounter: Payer: Self-pay | Admitting: Family Medicine

## 2022-05-21 ENCOUNTER — Ambulatory Visit: Payer: Medicare Other | Admitting: Family Medicine

## 2022-05-21 VITALS — BP 126/82 | HR 107 | Ht 60.0 in | Wt 155.4 lb

## 2022-05-21 DIAGNOSIS — M5416 Radiculopathy, lumbar region: Secondary | ICD-10-CM

## 2022-05-21 DIAGNOSIS — R202 Paresthesia of skin: Secondary | ICD-10-CM

## 2022-05-21 MED ORDER — GABAPENTIN 100 MG PO CAPS: 100.0000 mg | ORAL_CAPSULE | Freq: Three times a day (TID) | ORAL | 3 refills | Status: DC | PRN

## 2022-05-21 NOTE — Progress Notes (Signed)
Rubin Payor, PhD, LAT, ATC acting as a scribe for Clementeen Graham, MD.  [name] is a 82 y.o. female who presents to [facility] today for f/ R leg pain and paresthesia and NCV study review. Pt was last seen by Dr. Denyse Amass on 03/24/22 and was advised to proceed to NCV study. Today, pt reports no change in sx since last visit. Continues to have n/t. Ambulating with a cane today. Has concerns about balance/coordination.  Paresthesia is a worse on the right but do occur a little bit on the left. Dx testing: 05/11/22 LE NCV study 08/20/21 L-spine MRI 11/01/20 R knee XR            09/18/21 LE vascular US ([facility])  Pertinent review of systems: No fevers or chills  Relevant historical information: Lumbar decompression and fusion L2-L4   Exam:  BP 126/82   Pulse (!) 107   Ht 5' (1.524 m)   Wt 155 lb 6.4 oz (70.5 kg)   SpO2 96%   BMI 30.35 kg/m  General: Well Developed, well nourished, and in no acute distress.   MSK: Normal lumbar motion.  Lower extremity strength is intact.  Uses a cane to ambulate.    Lab and Radiology Results  EXAM: MRI LUMBAR SPINE WITHOUT CONTRAST   TECHNIQUE: Multiplanar, multisequence MR imaging of the lumbar spine was performed. No intravenous contrast was administered.   COMPARISON:  Lumbar spine x-rays dated August 05, 2016. MRI lumbar spine dated May 15, 2016.   FINDINGS: Segmentation:  Standard.   Alignment: New trace retrolisthesis at T11-T12. New mild retrolisthesis at L1-L2. Improved now fused anterolisthesis at L4-L5. New 5 mm anterolisthesis at L5-S1.   Vertebrae: No fracture, evidence of discitis, or bone lesion. Degenerative endplate marrow edema at T11-T12 and L1-L2.   Conus medullaris and cauda equina: Conus extends to the L1 level. Conus and cauda equina appear normal.   Paraspinal and other soft tissues: Posterior midline 1.8 x 4.6 x 5.0 cm seroma from L3-L5.   Disc levels:   T11-T12:  Progressive mild disc bulging.  No stenosis.   T12-L1: Negative disc. Progressive mild bilateral facet arthropathy. No stenosis.   L1-L2: Progressive moderate disc bulging, endplate spurring, and severe bilateral facet arthropathy. New mild spinal canal stenosis. New moderate bilateral neuroforaminal stenosis.   L2-L3 to L4-L5: Interval posterior decompression and PLIF. No residual stenosis.   L5-S1: Progressive disc uncovering and mild disc bulging. Progressive severe bilateral facet arthropathy. No stenosis.   IMPRESSION: 1. Interval posterior decompression and PLIF from L2-L3 to L4-L5 without residual stenosis. 2. 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 3. Progressive adjacent segment disease at L5-S1 without stenosis.     Electronically Signed   By: Obie Dredge M.D.   On: 08/20/2021 13:53 I, Clementeen Graham, personally (independently) visualized and performed the interpretation of the images attached in this note.   Nerve conduction study dated May 11, 2022 NCV & EMG Findings: Extensive electrodiagnostic evaluation of the right lower limb with additional needle examination of the left lower limb shows: Right sural and superficial peroneal/fibular sensory responses are within normal limits. Right peroneal/fibular (EDB) motor responses shows reduced amplitude (1.39 mV). Right peroneal/fibular (TA) and tibial (AH) motor responses are within normal limits. Right H reflex is absent. Chronic motor axon loss changes WITHOUT accompanying active denervation changes are seen in bilateral tibialis anterior, right medial head of gastrocnemius, bilateral  flexor digitorum longus, bilateral short head of biceps femoris, and right gluteus medius muscles. Chronic motor axon loss changes WITH active denervation changes are seen in the left medial head of gastrocnemius muscle.   Impression: This is an abnormal  study. 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 An active/ongoing (or incompletely reinnervated) intraspinal canal lesion (ie: motor radiculopathy) at the left S1 root or segment, moderate in degree electrically. No electrodiagnostic evidence of a large fiber sensorimotor neuropathy. Absent H reflex may be normal in a patient of this age.    Assessment and Plan: 82 y.o. female with lower extremity paresthesia.  Her symptoms are typical for right L5.  Her recent nerve conduction study does support this diagnosis as well.   Her MRI lumbar spine does show the possibility for impingement at L5-S1. Plan for trial of an epidural steroid injection.  She will need to pause the Plavix temporarily.  In addition we can try a very low-dose gabapentin.  She already has had a trial of physical therapy and is incorporating those techniques to her fall prevention.  Recommend using a walker more frequently. Unfortunately it is hard to know how much better she is going to get.  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  PDMP not reviewed this encounter. Orders Placed This Encounter  Procedures   DG INJECT DIAG/THERA/INC NEEDLE/CATH/PLC EPI/LUMB/SAC W/IMG    Standing Status:   Future    Standing Expiration Date:   05/21/2023    Order Specific Question:   Reason for Exam (SYMPTOM  OR DIAGNOSIS REQUIRED)    Answer:   ESI BL L5 symtoms. Level and technique per radiology    Order Specific Question:   Preferred Imaging Location?    Answer:   [id number]    Order Specific Question:   Radiology Contrast Protocol - do NOT remove file path    Answer:   [email or web address]   Meds ordered this encounter  Medications   gabapentin (NEURONTIN)  100 MG capsule    Sig: Take 1-3 capsules (100-300 mg total) by mouth 3 (three) times daily as needed (nerve pain or tingling).    Dispense:  90 capsule    Refill:  3     Discussed warning signs or symptoms. Please see discharge instructions. Patient expresses understanding.   The above documentation has been reviewed and is accurate and complete Clementeen Graham, M.D. Total encounter time 30 minutes including face-to-face time with the patient and, reviewing past medical record, and charting on the date of service.

## 2022-05-21 NOTE — Patient Instructions (Addendum)
Thank you for coming in today.   Please call [facility] at [phone] to schedule your spine injection.    Try gabapentin up to three times a day. You can take up to 3 pills at a time if you needed.   Let me know how you feel after the injection and how the gabapentin goes.   STOP plavix in preparation for back injection.

## 2022-06-04 ENCOUNTER — Ambulatory Visit
Admission: RE | Admit: 2022-06-04 | Discharge: 2022-06-04 | Disposition: A | Discharge status: Home / Self Care | Payer: Medicare Other | Source: Ambulatory Visit | Attending: Family Medicine | Admitting: Family Medicine

## 2022-06-04 DIAGNOSIS — M47817 Spondylosis without myelopathy or radiculopathy, lumbosacral region: Secondary | ICD-10-CM | POA: Diagnosis not present

## 2022-06-04 DIAGNOSIS — R200 Anesthesia of skin: Secondary | ICD-10-CM | POA: Diagnosis not present

## 2022-06-04 DIAGNOSIS — R202 Paresthesia of skin: Secondary | ICD-10-CM

## 2022-06-04 DIAGNOSIS — M5416 Radiculopathy, lumbar region: Secondary | ICD-10-CM

## 2022-06-04 MED ORDER — METHYLPREDNISOLONE ACETATE 40 MG/ML INJ SUSP (RADIOLOG
80.0000 mg | Freq: Once | INTRAMUSCULAR | Status: AC
  Administered 2022-06-04: 80 mg via EPIDURAL

## 2022-06-04 MED ORDER — IOPAMIDOL (ISOVUE-M 200) INJECTION 41%
1.0000 mL | Freq: Once | INTRAMUSCULAR | Status: AC
  Administered 2022-06-04: 1 mL via EPIDURAL

## 2022-06-04 NOTE — Discharge Instructions (Signed)

## 2022-06-17 ENCOUNTER — Other Ambulatory Visit: Payer: Self-pay | Admitting: Physician Assistant

## 2022-08-08 ENCOUNTER — Other Ambulatory Visit: Payer: Self-pay | Admitting: Physician Assistant

## 2022-08-17 DIAGNOSIS — H25813 Combined forms of age-related cataract, bilateral: Secondary | ICD-10-CM | POA: Diagnosis not present

## 2022-08-17 DIAGNOSIS — H52223 Regular astigmatism, bilateral: Secondary | ICD-10-CM | POA: Diagnosis not present

## 2022-08-17 DIAGNOSIS — H524 Presbyopia: Secondary | ICD-10-CM | POA: Diagnosis not present

## 2022-08-17 DIAGNOSIS — H5213 Myopia, bilateral: Secondary | ICD-10-CM | POA: Diagnosis not present

## 2022-09-15 ENCOUNTER — Other Ambulatory Visit: Payer: Self-pay | Admitting: Family Medicine

## 2022-09-15 ENCOUNTER — Other Ambulatory Visit: Payer: Self-pay | Admitting: Physician Assistant

## 2022-09-16 NOTE — Telephone Encounter (Signed)
Last OV 05/21/22 Next OV not scheduled  Last refill 05/21/22 Qty #90/3

## 2022-11-03 ENCOUNTER — Other Ambulatory Visit: Payer: Self-pay | Admitting: Physician Assistant

## 2022-11-24 ENCOUNTER — Ambulatory Visit: Payer: Medicare Other

## 2022-11-24 ENCOUNTER — Ambulatory Visit (INDEPENDENT_AMBULATORY_CARE_PROVIDER_SITE_OTHER): Payer: Medicare Other

## 2022-11-24 VITALS — Wt 155.0 lb

## 2022-11-24 DIAGNOSIS — Z0000 Encounter for general adult medical examination without abnormal findings: Secondary | ICD-10-CM

## 2022-11-24 NOTE — Progress Notes (Addendum)
Subjective:   [name] is a 82 y.o. female who presents for Medicare Annual (Subsequent) preventive examination.  Visit Complete: Virtual I connected with  [name] on 11/24/22 by a audio enabled telemedicine application and verified that I am speaking with the correct person using two identifiers.  Patient Location: Home  Provider Location: Office/Clinic  I discussed the limitations of evaluation and management by telemedicine. The patient expressed understanding and agreed to proceed.  Vital Signs: Because this visit was a virtual/telehealth visit, some criteria may be missing or patient reported. Any vitals not documented were not able to be obtained and vitals that have been documented are patient reported.  Patient Medicare AWV questionnaire was completed by the patient on 11/21/22; I have confirmed that all information answered by patient is correct and no changes since this date.  Cardiac Risk Factors include: advanced age (>31men, >68 women);obesity (BMI >30kg/m2);dyslipidemia     Objective:    Today's Vitals   11/24/22 0748  Weight: 155 lb (70.3 kg)   Body mass index is 30.27 kg/m.     11/24/2022    7:53 AM 11/18/2021    7:53 AM 09/10/2021    9:40 AM 08/14/2021    4:17 PM 10/17/2018    2:27 PM 12/30/2016    2:28 PM 11/03/2016    6:57 PM  Advanced Directives  Does Patient Have a Medical Advance Directive? No No Yes No No No No  Would patient like information on creating a medical advance directive? No - Patient declined Yes (MAU/Ambulatory/Procedural Areas - Information given)  No - Patient declined Yes (MAU/Ambulatory/Procedural Areas - Information given)  No - Patient declined    Current Medications (verified) Outpatient Encounter Medications as of 11/24/2022  Medication Sig   alendronate (FOSAMAX) 70 MG tablet TAKE 1 TABLET(70 MG) BY MOUTH EVERY 7 DAYS WITH A FULL GLASS OF WATER AND ON AN EMPTY STOMACH   atorvastatin (LIPITOR) 40 MG  tablet TAKE 1 TABLET(40 MG) BY MOUTH DAILY   clopidogrel (PLAVIX) 75 MG tablet TAKE 1 TABLET(75 MG) BY MOUTH DAILY   gabapentin (NEURONTIN) 100 MG capsule TAKE 1-3 CAPSULES BY MOUTH THREE TIMES DAILY AS NEEDED FOR NERVE PAIN OR TINGLING   hydrochlorothiazide (HYDRODIURIL) 12.5 MG tablet TAKE 1 TABLET(12.5 MG) BY MOUTH DAILY   losartan (COZAAR) 100 MG tablet TAKE 1 TABLET(100 MG) BY MOUTH DAILY   sertraline (ZOLOFT) 50 MG tablet TAKE 1 TABLET(50 MG) BY MOUTH DAILY   [DISCONTINUED] KRILL OIL PO Take by mouth.   No facility-administered encounter medications on file as of 11/24/2022.    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HCC)    Vitamin D deficiency    Past Surgical History:  Procedure Laterality Date   BACK SURGERY     CHOLECYSTECTOMY  2006   IR GASTROSTOMY TUBE MOD SED  11/17/2016   IR GASTROSTOMY TUBE REMOVAL  01/18/2017   S/P Hysterectomy  1990   Family History  Problem Relation Age of Onset   Stroke Mother    Hyperlipidemia Mother    Diabetes Mellitus II Mother    Hypertension Mother    Heart disease Mother    Stroke Father    Hyperlipidemia Father    Hypertension Father    Heart disease  Father    Colon cancer Maternal Aunt    Hypertension Son    Anxiety disorder Son    Social History   Socioeconomic History   Marital status: Widowed    Spouse name: Not on file   Number of children: Not on file   Years of education: Not on file   Highest education level: Not on file  Occupational History   Not on file  Tobacco Use   Smoking status: Never   Smokeless tobacco: Never  Vaping Use   Vaping status: Never Used  Substance and Sexual Activity   Alcohol use: No   Drug use: No   Sexual activity: Never  Other Topics Concern   Not on file  Social History Narrative   Not on  file   Social Determinants of Health   Financial Resource Strain: Low Risk  (11/21/2022)   Overall Financial Resource Strain (CARDIA)    Difficulty of Paying Living Expenses: Not hard at all  Food Insecurity: No Food Insecurity (11/21/2022)   Hunger Vital Sign    Worried About Running Out of Food in the Last Year: Never true    Ran Out of Food in the Last Year: Never true  Transportation Needs: No Transportation Needs (11/21/2022)   PRAPARE - Administrator, Civil Service (Medical): No    Lack of Transportation (Non-Medical): No  Physical Activity: Insufficiently Active (11/21/2022)   Exercise Vital Sign    Days of Exercise per Week: 2 days    Minutes of Exercise per Session: 20 min  Stress: No Stress Concern Present (11/21/2022)   [employer] of Occupational Health - Occupational Stress Questionnaire    Feeling of Stress : Not at all  Social Connections: Moderately Integrated (11/21/2022)   Social Connection and Isolation Panel [NHANES]    Frequency of Communication with Friends and Family: More than three times a week    Frequency of Social Gatherings with Friends and Family: Once a week    Attends Religious Services: 1 to 4 times per year    Active Member of [employer] or Organizations: Yes    Attends Banker Meetings: More than 4 times per year    Marital Status: Widowed    Tobacco Counseling Counseling given: Not Answered   Clinical Intake:  Pre-visit preparation completed: Yes  Pain : No/denies pain     BMI - recorded: 30.27 Nutritional Status: BMI > 30  Obese Nutritional Risks: None Diabetes: No  How often do you need to have someone help you when you read instructions, pamphlets, or other written materials from your doctor or pharmacy?: 1 - Never  Interpreter Needed?: No  Information entered by :: Lanier Ensign, LPN   Activities of Daily Living    11/21/2022    9:38 AM  In your present state of health, do you have any difficulty  performing the following activities:  Hearing? 0  Vision? 0  Difficulty concentrating or making decisions? 0  Walking or climbing stairs? 1  Dressing or bathing? 0  Doing errands, shopping? 1  Comment has Network engineer and eating ? N  Using the Toilet? N  In the past six months, have you accidently leaked urine? N  Do you have problems with loss of bowel control? N  Managing your Medications? N  Managing your Finances? N  Housekeeping or managing your Housekeeping? N    Patient Care Team: Jarold Motto, [place] as PCP - General (Physician Assistant)  Indicate any recent  Medical Services you may have received from other than [facility] providers in the past year (date may be approximate).     Assessment:   This is a routine wellness examination for [name].  Hearing/Vision screen Hearing Screening - Comments:: Pt denies any hearing issues  Vision Screening - Comments:: Pt follows up with Dr Lucina Mellow for annual eye exams    Goals Addressed             This Visit's Progress    Patient Stated       Walk better        Depression Screen    11/24/2022    7:51 AM 11/18/2021    7:51 AM 11/13/2021   11:10 AM 09/24/2020   10:28 AM 10/17/2018    2:28 PM 01/22/2017    2:05 PM  PHQ 2/9 Scores  PHQ - 2 Score 0 0 0 0 0 0  Exception Documentation      Medical reason    Fall Risk    11/21/2022    9:38 AM 11/18/2021    7:54 AM 11/17/2021    6:58 PM 11/13/2021   11:10 AM 09/24/2020   10:28 AM  Fall Risk   Falls in the past year? 1 1 0 0 1  Number falls in past yr:  1 0 0 0  Injury with Fall? 1 1 0 0 1  Comment broken figers on right bruised left  knee and arm     Risk for fall due to : Impaired balance/gait;Impaired mobility;Impaired vision;History of fall(s) Impaired vision;Impaired balance/gait     Follow up Falls prevention discussed Falls prevention discussed  Falls evaluation completed     MEDICARE RISK AT HOME: Medicare Risk at Home Any stairs in or around the home?:  No Home free of loose throw rugs in walkways, pet beds, electrical cords, etc?: No Adequate lighting in your home to reduce risk of falls?: Yes Life alert?: No Use of a cane, walker or w/c?: Yes Grab bars in the bathroom?: Yes Shower chair or bench in shower?: Yes Elevated toilet seat or a handicapped toilet?: No  TIMED UP AND GO:  Was the test performed?  No    Cognitive Function:        11/24/2022    7:55 AM 10/17/2018    2:29 PM  6CIT Screen  What Year? 0 points 0 points  What month? 0 points 0 points  What time? 0 points 0 points  Count back from 20 0 points 0 points  Months in reverse 0 points 0 points  Repeat phrase 0 points   Total Score 0 points     Immunizations Immunization History  Administered Date(s) Administered   Fluad Quad(high Dose 65+) 11/03/2019, 09/24/2020, 10/07/2021   Influenza Whole 09/09/2009, 10/13/2017   Influenza, High Dose Seasonal PF 12/02/2019   PFIZER(Purple Top)SARS-COV-2 Vaccination 02/24/2019, 03/17/2019, 12/02/2019   PNEUMOCOCCAL CONJUGATE-20 11/17/2021   Pneumococcal Conjugate-13 05/18/2013   Pneumococcal Polysaccharide-23 11/04/2018   Td 11/17/2021   Zoster Recombinant(Shingrix) 11/04/2018   Zoster, Live 05/18/2013    TDAP status: Up to date  Flu Vaccine status: Due, Education has been provided regarding the importance of this vaccine. Advised may receive this vaccine at local pharmacy or Health Dept. Aware to provide a copy of the vaccination record if obtained from local pharmacy or Health Dept. Verbalized acceptance and understanding.  Pneumococcal vaccine status: Up to date  Covid-19 vaccine status: Information provided on how to obtain vaccines.   Qualifies for Shingles Vaccine? Yes  Zostavax completed Yes   Shingrix Completed?: No.    Education has been provided regarding the importance of this vaccine. Patient has been advised to call insurance company to determine out of pocket expense if they have not yet received  this vaccine. Advised may also receive vaccine at local pharmacy or Health Dept. Verbalized acceptance and understanding.  Screening Tests Health Maintenance  Topic Date Due   MAMMOGRAM  12/11/2016   Zoster Vaccines- Shingrix (2 of 2) 12/30/2018   COVID-19 Vaccine (4 - 2023-24 season) 09/13/2022   INFLUENZA VACCINE  04/12/2023 (Originally 08/13/2022)   Medicare Annual Wellness (AWV)  11/24/2023   DTaP/Tdap/Td (2 - Tdap) 11/18/2031   Pneumonia Vaccine 35+ Years old  Completed   DEXA SCAN  Completed   HPV VACCINES  Aged Out   Colonoscopy  Discontinued    Health Maintenance  Health Maintenance Due  Topic Date Due   MAMMOGRAM  12/11/2016   Zoster Vaccines- Shingrix (2 of 2) 12/30/2018   COVID-19 Vaccine (4 - 2023-24 season) 09/13/2022    Colorectal cancer screening: No longer required.   Mammogram status: No longer required due to age .  Bone Density status: Completed 05/02/19. Results reflect: Bone density results: OSTEOPOROSIS. Repeat every 2 years.  Additional Screening:  Vision Screening: Recommended annual ophthalmology exams for early detection of glaucoma and other disorders of the eye. Is the patient up to date with their annual eye exam?  Yes  Who is the provider or what is the name of the office in which the patient attends annual eye exams? Dr Lucina Mellow  If pt is not established with a provider, would they like to be referred to a provider to establish care? No .   Dental Screening: Recommended annual dental exams for proper oral hygiene  Community Resource Referral / Chronic Care Management: CRR required this visit?  No   CCM required this visit?  No     Plan:     I have personally reviewed and noted the following in the patient's chart:   Medical and social history Use of alcohol, tobacco or illicit drugs  Current medications and supplements including opioid prescriptions. Patient is not currently taking opioid prescriptions. Functional ability and  status Nutritional status Physical activity Advanced directives List of other physicians Hospitalizations, surgeries, and ER visits in previous 12 months Vitals Screenings to include cognitive, depression, and falls Referrals and appointments  In addition, I have reviewed and discussed with patient certain preventive protocols, quality metrics, and best practice recommendations. A written personalized care plan for preventive services as well as general preventive health recommendations were provided to patient.     Marzella Schlein, LPN   [id number]   After Visit Summary: (MyChart) Due to this being a telephonic visit, the after visit summary with patients personalized plan was offered to patient via MyChart   Nurse Notes: none

## 2022-11-24 NOTE — Patient Instructions (Signed)
Ms. Petrovic , Thank you for taking time to come for your Medicare Wellness Visit. I appreciate your ongoing commitment to your health goals. Please review the following plan we discussed and let me know if I can assist you in the future.   Referrals/Orders/Follow-Ups/Clinician Recommendations: work on walking better  Each day, aim for 6 glasses of water, plenty of protein in your diet and try to get up and walk/ stretch every hour for 5-10 minutes at a time.     This is a list of the screening recommended for you and due dates:  Health Maintenance  Topic Date Due   Mammogram  12/11/2016   Zoster (Shingles) Vaccine (2 of 2) 12/30/2018   Flu Shot  08/13/2022   COVID-19 Vaccine (4 - 2023-24 season) 09/13/2022   Medicare Annual Wellness Visit  11/24/2023   DTaP/Tdap/Td vaccine (2 - Tdap) 11/18/2031   Pneumonia Vaccine  Completed   DEXA scan (bone density measurement)  Completed   HPV Vaccine  Aged Out   Colon Cancer Screening  Discontinued    Advanced directives: (Declined) Advance directive discussed with you today. Even though you declined this today, please call our office should you change your mind, and we can give you the proper paperwork for you to fill out.  Next Medicare Annual Wellness Visit scheduled for next year: Yes

## 2022-12-05 ENCOUNTER — Other Ambulatory Visit: Payer: Self-pay | Admitting: Family Medicine

## 2022-12-07 NOTE — Telephone Encounter (Signed)
Forwarding to Dr. Denyse Amass to review request from pt for increase qty.

## 2022-12-07 NOTE — Telephone Encounter (Signed)
Patient called back in regards to this refill. She is taking around 8 a day, so the #90 quantity is only lasting her around 11 to 12 days. She asked if the quantity could be increased?

## 2022-12-07 NOTE — Telephone Encounter (Signed)
Last OV 05/21/22 Next OV not scheduled  Last refill 09/16/22 Qty #90/3

## 2022-12-16 ENCOUNTER — Other Ambulatory Visit: Payer: Self-pay | Admitting: Physician Assistant

## 2023-01-01 ENCOUNTER — Ambulatory Visit (INDEPENDENT_AMBULATORY_CARE_PROVIDER_SITE_OTHER): Payer: Medicare Other | Admitting: Internal Medicine

## 2023-01-01 ENCOUNTER — Encounter: Payer: Self-pay | Admitting: Internal Medicine

## 2023-01-01 ENCOUNTER — Ambulatory Visit: Payer: Self-pay | Admitting: Physician Assistant

## 2023-01-01 ENCOUNTER — Inpatient Hospital Stay (HOSPITAL_COMMUNITY)
Admission: EM | Admit: 2023-01-01 | Discharge: 2023-01-05 | DRG: 395 | Disposition: A | Discharge status: Home / Self Care | Payer: Medicare Other | Attending: Internal Medicine | Admitting: Internal Medicine

## 2023-01-01 ENCOUNTER — Emergency Department (HOSPITAL_COMMUNITY): Payer: Medicare Other

## 2023-01-01 ENCOUNTER — Other Ambulatory Visit: Payer: Self-pay

## 2023-01-01 ENCOUNTER — Encounter (HOSPITAL_COMMUNITY): Payer: Self-pay

## 2023-01-01 VITALS — BP 120/88 | HR 118 | Temp 98.4°F | Ht 60.0 in | Wt 158.9 lb

## 2023-01-01 DIAGNOSIS — Z823 Family history of stroke: Secondary | ICD-10-CM

## 2023-01-01 DIAGNOSIS — E785 Hyperlipidemia, unspecified: Secondary | ICD-10-CM | POA: Diagnosis present

## 2023-01-01 DIAGNOSIS — F419 Anxiety disorder, unspecified: Secondary | ICD-10-CM | POA: Diagnosis present

## 2023-01-01 DIAGNOSIS — Z6830 Body mass index (BMI) 30.0-30.9, adult: Secondary | ICD-10-CM

## 2023-01-01 DIAGNOSIS — Z800 Family history of malignant neoplasm of digestive organs: Secondary | ICD-10-CM

## 2023-01-01 DIAGNOSIS — K922 Gastrointestinal hemorrhage, unspecified: Secondary | ICD-10-CM | POA: Diagnosis not present

## 2023-01-01 DIAGNOSIS — Z85828 Personal history of other malignant neoplasm of skin: Secondary | ICD-10-CM

## 2023-01-01 DIAGNOSIS — Z8249 Family history of ischemic heart disease and other diseases of the circulatory system: Secondary | ICD-10-CM

## 2023-01-01 DIAGNOSIS — K640 First degree hemorrhoids: Secondary | ICD-10-CM | POA: Diagnosis present

## 2023-01-01 DIAGNOSIS — Z86006 Personal history of melanoma in-situ: Secondary | ICD-10-CM

## 2023-01-01 DIAGNOSIS — K621 Rectal polyp: Secondary | ICD-10-CM | POA: Diagnosis not present

## 2023-01-01 DIAGNOSIS — N281 Cyst of kidney, acquired: Secondary | ICD-10-CM | POA: Diagnosis not present

## 2023-01-01 DIAGNOSIS — Z7902 Long term (current) use of antithrombotics/antiplatelets: Secondary | ICD-10-CM

## 2023-01-01 DIAGNOSIS — H6121 Impacted cerumen, right ear: Secondary | ICD-10-CM

## 2023-01-01 DIAGNOSIS — K6289 Other specified diseases of anus and rectum: Secondary | ICD-10-CM

## 2023-01-01 DIAGNOSIS — Z860100 Personal history of colon polyps, unspecified: Secondary | ICD-10-CM

## 2023-01-01 DIAGNOSIS — R000 Tachycardia, unspecified: Secondary | ICD-10-CM | POA: Diagnosis not present

## 2023-01-01 DIAGNOSIS — Z833 Family history of diabetes mellitus: Secondary | ICD-10-CM

## 2023-01-01 DIAGNOSIS — K5730 Diverticulosis of large intestine without perforation or abscess without bleeding: Secondary | ICD-10-CM | POA: Diagnosis not present

## 2023-01-01 DIAGNOSIS — Z8673 Personal history of transient ischemic attack (TIA), and cerebral infarction without residual deficits: Secondary | ICD-10-CM

## 2023-01-01 DIAGNOSIS — I69322 Dysarthria following cerebral infarction: Secondary | ICD-10-CM

## 2023-01-01 DIAGNOSIS — K625 Hemorrhage of anus and rectum: Secondary | ICD-10-CM | POA: Diagnosis not present

## 2023-01-01 DIAGNOSIS — Z818 Family history of other mental and behavioral disorders: Secondary | ICD-10-CM

## 2023-01-01 DIAGNOSIS — Z83438 Family history of other disorder of lipoprotein metabolism and other lipidemia: Secondary | ICD-10-CM

## 2023-01-01 DIAGNOSIS — I10 Essential (primary) hypertension: Secondary | ICD-10-CM | POA: Diagnosis present

## 2023-01-01 DIAGNOSIS — E669 Obesity, unspecified: Secondary | ICD-10-CM | POA: Diagnosis present

## 2023-01-01 DIAGNOSIS — Z7983 Long term (current) use of bisphosphonates: Secondary | ICD-10-CM

## 2023-01-01 LAB — COMPREHENSIVE METABOLIC PANEL
ALT: 25 U/L (ref 0–44)
AST: 28 U/L (ref 15–41)
Albumin: 4.2 g/dL (ref 3.5–5.0)
Alkaline Phosphatase: 141 U/L — ABNORMAL HIGH (ref 38–126)
Anion gap: 7 (ref 5–15)
BUN: 21 mg/dL (ref 8–23)
CO2: 23 mmol/L (ref 22–32)
Calcium: 9.1 mg/dL (ref 8.9–10.3)
Chloride: 107 mmol/L (ref 98–111)
Creatinine, Ser: 0.75 mg/dL (ref 0.44–1.00)
GFR, Estimated: >60 mL/min (ref >60)
Glucose, Bld: 112 mg/dL — ABNORMAL HIGH (ref 70–99)
Potassium: 3.8 mmol/L (ref 3.5–5.1)
Sodium: 137 mmol/L (ref 135–145)
Total Bilirubin: 0.8 mg/dL (ref <1.2)
Total Protein: 7.4 g/dL (ref 6.5–8.1)

## 2023-01-01 LAB — CBC WITH DIFFERENTIAL/PLATELET
Abs Immature Granulocytes: 0.08 10*3/uL — ABNORMAL HIGH (ref 0.00–0.07)
Basophils Absolute: 0 10*3/uL (ref 0.0–0.1)
Basophils Relative: 0 %
Eosinophils Absolute: 0 10*3/uL (ref 0.0–0.5)
Eosinophils Relative: 0 %
HCT: 42.4 % (ref 36.0–46.0)
Hemoglobin: 13.7 g/dL (ref 12.0–15.0)
Immature Granulocytes: 1 %
Lymphocytes Relative: 13 %
Lymphs Abs: 1.2 10*3/uL (ref 0.7–4.0)
MCH: 30.2 pg (ref 26.0–34.0)
MCHC: 32.3 g/dL (ref 30.0–36.0)
MCV: 93.6 fL (ref 80.0–100.0)
Monocytes Absolute: 0.6 10*3/uL (ref 0.1–1.0)
Monocytes Relative: 6 %
Neutro Abs: 7.2 10*3/uL (ref 1.7–7.7)
Neutrophils Relative %: 80 %
Platelets: 242 10*3/uL (ref 150–400)
RBC: 4.53 MIL/uL (ref 3.87–5.11)
RDW: 13.2 % (ref 11.5–15.5)
WBC: 9.1 10*3/uL (ref 4.0–10.5)
nRBC: 0 % (ref 0.0–0.2)

## 2023-01-01 LAB — TYPE AND SCREEN
ABO/RH(D): A POS
Antibody Screen: NEGATIVE

## 2023-01-01 LAB — POC OCCULT BLOOD, ED: Fecal Occult Bld: POSITIVE — AB

## 2023-01-01 LAB — PROTIME-INR
INR: 1 (ref 0.8–1.2)
Prothrombin Time: 12.9 s (ref 11.4–15.2)

## 2023-01-01 MED ORDER — ATORVASTATIN CALCIUM 40 MG PO TABS
40.0000 mg | ORAL_TABLET | Freq: Every day | ORAL | Status: DC
  Administered 2023-01-02 – 2023-01-05 (×4): 40 mg via ORAL
  Filled 2023-01-01 (×4): qty 1

## 2023-01-01 MED ORDER — ONDANSETRON HCL 4 MG PO TABS: 4.0000 mg | ORAL_TABLET | Freq: Four times a day (QID) | ORAL | Status: DC | PRN

## 2023-01-01 MED ORDER — POLYETHYLENE GLYCOL 3350 17 G PO PACK: 17.0000 g | PACK | Freq: Every day | ORAL | Status: DC | PRN

## 2023-01-01 MED ORDER — POTASSIUM CHLORIDE IN NACL 20-0.9 MEQ/L-% IV SOLN
INTRAVENOUS | Status: AC
  Filled 2023-01-01 (×2): qty 1000

## 2023-01-01 MED ORDER — ACETAMINOPHEN 325 MG PO TABS: 650.0000 mg | ORAL_TABLET | Freq: Four times a day (QID) | ORAL | Status: DC | PRN

## 2023-01-01 MED ORDER — ACETAMINOPHEN 650 MG RE SUPP: 650.0000 mg | Freq: Four times a day (QID) | RECTAL | Status: DC | PRN

## 2023-01-01 MED ORDER — PANTOPRAZOLE SODIUM 40 MG IV SOLR
40.0000 mg | Freq: Once | INTRAVENOUS | Status: AC
  Administered 2023-01-01: 40 mg via INTRAVENOUS
  Filled 2023-01-01: qty 10

## 2023-01-01 MED ORDER — SODIUM CHLORIDE 0.9 % IV BOLUS
500.0000 mL | Freq: Once | INTRAVENOUS | Status: AC
  Administered 2023-01-01: 500 mL via INTRAVENOUS

## 2023-01-01 MED ORDER — SERTRALINE HCL 25 MG PO TABS
50.0000 mg | ORAL_TABLET | Freq: Every day | ORAL | Status: DC
  Administered 2023-01-01 – 2023-01-05 (×5): 50 mg via ORAL
  Filled 2023-01-01 (×5): qty 2

## 2023-01-01 MED ORDER — ONDANSETRON HCL 4 MG/2ML IJ SOLN: 4.0000 mg | Freq: Four times a day (QID) | INTRAMUSCULAR | Status: DC | PRN

## 2023-01-01 MED ORDER — IOHEXOL 350 MG/ML SOLN
75.0000 mL | Freq: Once | INTRAVENOUS | Status: AC | PRN
  Administered 2023-01-01: 75 mL via INTRAVENOUS

## 2023-01-01 NOTE — H&P (Addendum)
History and Physical    [name] RUE:[id number] DOB: [birth year] DOA: 01/01/2023  PCP: Jarold Motto, PA  Patient coming from: Home  I have personally briefly reviewed patient's old medical records in [facility]  Chief Complaint: Rectal bleeding  HPI: [name] is a 82 y.o. female with medical history significant for history of CVA on Plavix, HTN, HLD, anxiety who presented to the ED for evaluation of rectal bleeding.  Patient reports he had intermittent bleeding from her rectum over the last month.  She has noticed increased volume of red blood from her rectum this last 2 days.  Today she developed spontaneous bleeding and some clots.  She has not had any rectal or abdominal pain.  She denies nausea, vomiting, chest pain, dyspnea.  She has not had any lightheadedness or dizziness.  She does take Plavix for prior history of stroke, last dose was taken 12/19 AM.  Patient states he lives alone.  She uses a rolling walker when ambulating outside her home.  Denies recent falls.  ED Course  Labs/Imaging on admission: I have personally reviewed following labs and imaging studies.  Initial vitals showed BP 120/88, pulse 118, RR 18, temp 98.4 F, SpO2 95% on room air.  Labs showed WBC 9.1, hemoglobin 13.7, platelets 242,000, sodium 137, potassium 3.8, bicarb 23, BUN 21, creatinine 0.75, serum glucose 112.  FOBT is positive.  Patient was given 500 cc normal saline, IV Protonix 40 mg.  EDP discussed with [facility] Dr. Rhea Belton who recommended medical admission, obtain CT A/P, and they will see in consultation.  The hospitalist service was consulted to admit.  Review of Systems: All systems reviewed and are negative except as documented in history of present illness above.   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 She has never used smokeless tobacco. She reports that she does not drink alcohol and does not use drugs.  Allergies  Allergen Reactions   Lisinopril Other (See Comments)    Dizziness and felt odd--cough     Family History  Problem Relation Age of Onset   Stroke Mother    Hyperlipidemia Mother    Diabetes Mellitus II Mother    Hypertension Mother    Heart disease Mother    Stroke Father    Hyperlipidemia Father    Hypertension Father    Heart disease Father    Colon cancer Maternal Aunt    Hypertension Son    Anxiety disorder Son      Prior to Admission medications   Medication Sig Start Date End Date Taking? Authorizing Provider  alendronate (FOSAMAX) 70 MG tablet TAKE 1 TABLET(70 MG) BY MOUTH EVERY 7 DAYS WITH A FULL GLASS OF WATER AND ON AN EMPTY STOMACH 11/03/22   Jarold Motto, PA  atorvastatin (LIPITOR) 40 MG tablet TAKE 1 TABLET(40 MG) BY MOUTH DAILY 12/16/22   Jarold Motto, PA  clopidogrel (PLAVIX) 75 MG tablet  TAKE 1 TABLET(75 MG) BY MOUTH DAILY 09/15/22   Jarold Motto, PA  gabapentin (NEURONTIN) 100 MG capsule TAKE 1 TO 3 CAPSULES BY MOUTH THREE TIMES DAILY AS NEEDED FOR NERVE PAIN OR TINGLING 12/07/22   Rodolph Bong, MD  hydrochlorothiazide (HYDRODIURIL) 12.5 MG tablet TAKE 1 TABLET(12.5 MG) BY MOUTH DAILY 12/16/22   Jarold Motto, PA  losartan (COZAAR) 100 MG tablet TAKE 1 TABLET(100 MG) BY MOUTH DAILY 12/16/22   Jarold Motto, PA  sertraline (ZOLOFT) 50 MG tablet TAKE 1 TABLET(50 MG) BY MOUTH DAILY 12/16/22   Jarold Motto, [place]    Physical Exam: Vitals:   01/01/23 1715 01/01/23 1745 01/01/23 1858 01/01/23 1900  BP: (!) 148/79 (!) 154/82 (!) 158/88   Pulse: (!)  101 99 (!) 111 (!) 113  Resp: 19 (!) 21 (!) 23 (!) 23  Temp:   97.7 F (36.5 C)   TempSrc:   Oral   SpO2: 93% 92% 97% 95%  Weight:      Height:       Constitutional: Resting in bed, NAD, calm, comfortable Eyes: EOMI, lids and conjunctivae normal ENMT: Mucous membranes are moist. Posterior pharynx clear of any exudate or lesions.Normal dentition.  Neck: normal, supple, no masses. Respiratory: clear to auscultation bilaterally, no wheezing, no crackles. Normal respiratory effort. No accessory muscle use.  Cardiovascular: Regular rate and rhythm, no murmurs / rubs / gallops. No extremity edema. 2+ pedal pulses. Abdomen: no tenderness, no masses palpated.  Musculoskeletal: no clubbing / cyanosis. No joint deformity upper and lower extremities. Good ROM, no contractures. Normal muscle tone.  Skin: no rashes, lesions, ulcers. No induration Neurologic: Sensation intact. Strength 5/5 in all 4.  Psychiatric: Normal judgment and insight. Alert and oriented x 3. Normal mood.   EKG: Personally reviewed. Sinus tachycardia, rate 107, no acute ischemic changes.  Assessment/Plan Principal Problem:   BRBPR (bright red blood per rectum) Active Problems:   HLD (hyperlipidemia)   Benign essential HTN   Anxiety   History of CVA (cerebrovascular accident)   [name] is a 82 y.o. female with medical history significant for history of CVA on Plavix, HTN, HLD, anxiety who is admitted for evaluation of lower GI bleeding.  Assessment and Plan: Lower GI bleeding: Patient with 1 month intermittent painless rectal bleeding, increased volume over the last 2 days with clots.  Mild tachycardia otherwise vital stable.  Hemoglobin stable at 13.7. -[facility] to consult -Keep n.p.o. after midnight -Continue maintenance IV fluid hydration overnight while NPO -Repeat CBC in a.m. -Hold Plavix -Follow CT abdomen/pelvis  History of CVA: Holding Plavix.  Continue  atorvastatin.  Hypertension: Holding losartan and HCTZ for now.  Hyperlipidemia: Continue atorvastatin.  Anxiety: Continue Zoloft.   DVT prophylaxis: SCDs Start: 01/01/23 2007 Code Status: Full code, confirmed with patient on admission Family Communication: Discussed with patient, she has discussed with family Disposition Plan: From home and likely discharge to home pending clinical progress Consults called: [facility] Severity of Illness: The appropriate patient status for this patient is OBSERVATION. Observation status is judged to be reasonable and necessary in order to provide the required intensity of service to ensure the patient's safety. The patient's presenting symptoms, physical exam findings, and initial radiographic and laboratory data in the context of their medical condition is felt to place them at decreased risk for further clinical deterioration. Furthermore, it is anticipated that the patient will be medically stable for discharge from the hospital within 2 midnights of admission.   Deserai Cansler  Allena Katz MD [facility]  If 7PM-7AM, please contact night-coverage [email or web address]  01/01/2023, 8:10 PM

## 2023-01-01 NOTE — ED Notes (Signed)
ED TO INPATIENT HANDOFF REPORT  ED Nurse Name and Phone #: Myeasha Ballowe  S Name/Age/Gender Debroah Loop 82 y.o. female Room/Bed: 044C/044C  Code Status   Code Status: Prior  Home/SNF/Other Home Patient oriented to: self, place, time, and situation Is this baseline? Yes   Triage Complete: Triage complete  Chief Complaint BRBPR (bright red blood per rectum) [K62.5]  Triage Note Pt BIB EMS for rectal bleeding that started over a month ago but has gotten worse over the past 2 days, increased clots. Bright red blood. PCP wanted pt to go to hospital. Axox4. VSS.    Allergies Allergies  Allergen Reactions   Lisinopril Other (See Comments)    Dizziness and felt odd--cough     Level of Care/Admitting Diagnosis ED Disposition     ED Disposition  Admit   Condition  --   Comment  Hospital Area: [facility] [[id number]]  Level of Care: Med-Surg [16]  May place patient in observation at [facility] or [facility] if equivalent level of care is available:: No  Covid Evaluation: Asymptomatic - no recent exposure (last 10 days) testing not required  Diagnosis: BRBPR (bright red blood per rectum) [865784]  Admitting Physician: Charlsie Quest [[id number]]  Attending Physician: Charlsie Quest [[id number]]          B Medical/Surgery History 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    HLD (hyperlipidemia)    Melanoma in situ (HCC)    Vitamin D deficiency    Past Surgical History:  Procedure Laterality Date   BACK SURGERY     CHOLECYSTECTOMY  2006   IR GASTROSTOMY TUBE MOD SED  11/17/2016   IR GASTROSTOMY TUBE REMOVAL  01/18/2017   S/P Hysterectomy  1990     A IV Location/Drains/Wounds Patient Lines/Drains/Airways Status     Active Line/Drains/Airways     Name Placement date Placement time Site Days   Peripheral IV 01/01/23 20 G Anterior;Right Forearm  01/01/23  1700  Forearm  less than 1            Intake/Output Last 24 hours No intake or output data in the 24 hours ending 01/01/23 1949  Labs/Imaging Results for orders placed or performed during the hospital encounter of 01/01/23 (from the past 48 hours)  Comprehensive metabolic panel     Status: Abnormal   Collection Time: 01/01/23  4:00 PM  Result Value Ref Range   Sodium 137 135 - 145 mmol/L   Potassium 3.8 3.5 - 5.1 mmol/L   Chloride 107 98 - 111 mmol/L   CO2 23 22 - 32 mmol/L   Glucose, Bld 112 (H) 70 - 99 mg/dL    Comment: Glucose reference range applies only to samples taken after fasting for at least 8 hours.   BUN 21 8 - 23 mg/dL   Creatinine, Ser 0.10 0.44 - 1.00 mg/dL   Calcium 9.1 8.9 - 27.2 mg/dL   Total Protein 7.4 6.5 - 8.1 g/dL   Albumin 4.2 3.5 - 5.0 g/dL   AST 28 15 - 41 U/L   ALT 25 0 - 44 U/L   Alkaline Phosphatase 141 (H) 38 - 126 U/L   Total Bilirubin 0.8 <1.2 mg/dL   GFR, Estimated >53 >66 mL/min    Comment: (NOTE) Calculated using the CKD-EPI Creatinine Equation (2021)    Anion gap 7 5 -  15    Comment: Performed at [facility], [address], [place], [place] [address]  CBC with Differential     Status: Abnormal   Collection Time: 01/01/23  4:00 PM  Result Value Ref Range   WBC 9.1 4.0 - 10.5 K/uL   RBC 4.53 3.87 - 5.11 MIL/uL   Hemoglobin 13.7 12.0 - 15.0 g/dL   HCT 66.4 40.3 - 47.4 %   MCV 93.6 80.0 - 100.0 fL   MCH 30.2 26.0 - 34.0 pg   MCHC 32.3 30.0 - 36.0 g/dL   RDW 25.9 56.3 - 87.5 %   Platelets 242 150 - 400 K/uL   nRBC 0.0 0.0 - 0.2 %   Neutrophils Relative % 80 %   Neutro Abs 7.2 1.7 - 7.7 K/uL   Lymphocytes Relative 13 %   Lymphs Abs 1.2 0.7 - 4.0 K/uL   Monocytes Relative 6 %   Monocytes Absolute 0.6 0.1 - 1.0 K/uL   Eosinophils Relative 0 %   Eosinophils Absolute 0.0 0.0 - 0.5 K/uL   Basophils Relative 0 %   Basophils Absolute 0.0 0.0 - 0.1 K/uL   Immature Granulocytes 1 %   Abs Immature Granulocytes 0.08 (H)  0.00 - 0.07 K/uL    Comment: Performed at [facility], [address] [place], [place] [address]  Protime-INR     Status: None   Collection Time: 01/01/23  4:00 PM  Result Value Ref Range   Prothrombin Time 12.9 11.4 - 15.2 seconds   INR 1.0 0.8 - 1.2    Comment: (NOTE) INR goal varies based on device and disease states. Performed at [facility], [address], [place], [place] [address]   Type and screen [facility]     Status: None   Collection Time: 01/01/23  4:00 PM  Result Value Ref Range   ABO/RH(D) A POS    Antibody Screen NEG    Sample Expiration      01/04/2023,2359 Performed at [facility], [address], [place], [place] [address]   POC occult blood, ED     Status: Abnormal   Collection Time: 01/01/23  5:29 PM  Result Value Ref Range   Fecal Occult Bld POSITIVE (A) NEGATIVE   No results found.  Pending Labs Unresulted Labs (From admission, onward)    None       Vitals/Pain Today's Vitals   01/01/23 1715 01/01/23 1745 01/01/23 1858 01/01/23 1900  BP: (!) 148/79 (!) 154/82 (!) 158/88   Pulse: (!) 101 99 (!) 111 (!) 113  Resp: 19 (!) 21 (!) 23 (!) 23  Temp:   97.7 F (36.5 C)   TempSrc:   Oral   SpO2: 93% 92% 97% 95%  Weight:      Height:      PainSc:   0-No pain     Isolation Precautions No active isolations  Medications Medications  pantoprazole (PROTONIX) injection 40 mg (40 mg Intravenous Given 01/01/23 1707)  sodium chloride 0.9 % bolus 500 mL (0 mLs Intravenous Stopped 01/01/23 1854)  iohexol (OMNIPAQUE) 350 MG/ML injection 75 mL (75 mLs Intravenous Contrast Given 01/01/23 1859)    Mobility walks with person assist     Focused Assessments Cardiac Assessment Handoff:    Lab Results  Component Value Date   CKTOTAL 159 03/17/2022   No results found for: "DDIMER" Does the Patient currently have chest pain? No    R Recommendations: See Admitting  Provider Note  Report given to:    Additional Notes: n/a

## 2023-01-01 NOTE — Telephone Encounter (Signed)
FYI, pt scheduled with Jon Billings today.

## 2023-01-01 NOTE — Progress Notes (Signed)
[facility]: [phone]   -- Medical Office Visit --  Patient:  [name]      Age: 82 y.o.       Sex:  female  Date:   01/01/2023 Today's Healthcare Provider: Lula Olszewski, MD  =======================   CHIEF COMPLAINT: Rectal Bleeding (Intermittent for a month or so, was seeing blood on tissue only. This morning was heavier with clots.)   Assessment & Plan Gastrointestinal hemorrhage associated with anorectal source Assessment & Plan: 82 year old female with acute lower GI bleed with hemodynamic instability  Assessment: 1. Acute GI Bleed - Bright red blood with clots suggesting lower GI source - Hemodynamically significant with tachycardia (HR 130) and relative hypotension (MAP 80) - No abdominal pain - Risk factors include:   * Anticoagulation (clopidogrel)   * Recent fall   * Advanced age  74. Hemodynamic Instability - Tachycardic with relative hypotension - Alert mental status suggests compensating - Current medications (losartan/HCTZ) ARE NOT contributing to hypotension because she did not take today  Plan: 1. Transfer to Emergency Department for urgent evaluation    - Recommend holding next doses of:      * Clopidogrel      * Losartan      * HCTZ  2. Immediate Care Needs:    - IV access if not already established    - Type & Cross for potential transfusion    - Urgent GI consultation for likely colonoscopy    - Serial vital signs and hemodynamic monitoring    - CBC, CMP, Coags, Type & Cross  3. Monitoring:    - Serial hemodynamic assessments    - Strict I/O monitoring    - Monitor for further bleeding episodes    - Watch for signs of decompensation  4. Follow-up:    - Will need reassessment of antithrombotic therapy given history of CVA    - May need outpatient GI follow-up    - Will require close PCP follow-up after discharge  Disposition: Transfer to Emergency Department via EMS for urgent  evaluation and management of acute GI bleed with hemodynamic instability Impacted cerumen of right ear Treatment(s) deferred given urgent gastrointestinal concerns     Orders Placed During this Encounter:  Called 911, arranged ambulance transport to hospital     SUBJECTIVE: 82 y.o. female who has HLD (hyperlipidemia); Allergic rhinitis; VAGINITIS, ATROPHIC; Degenerative spondylolisthesis; Cerebral thrombosis with cerebral infarction; Benign essential HTN; Dysarthria, post-stroke; Acute blood loss anemia; Anemia; Anxiety; and Osteoporosis without current pathological fracture on their problem list.  HPI: 82 year old female presenting with acute bright red blood per rectum with clots, first noted this morning with three episodes since approximately 11:30 AM. Patient reports ongoing mild GI bleeding for several months but acutely worsened today. Denies abdominal pain, nausea, or vomiting. No prior history of GI bleeds. Patient experienced a fall in her foyer one week ago. Currently hemodynamically significant with HR 130 and MAP 80 (lower than baseline), though remains alert and oriented. Temperature normal. Patient denies NSAID or aspirin use, though notably on clopidogrel 75mg  daily for history of cerebral thrombosis with prior stroke (with residual dysarthria). PMH significant for HTN, HLD, CVA, osteoporosis, anxiety, and chronic anemia. Additional medications include losartan, HCTZ, atorvastatin, gabapentin, sertraline, and alendronate.  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  Problem list overviews that were updated at today's visit:No problems updated.  Med reconciliation: Current Outpatient Medications on File Prior to Visit  Medication Sig   alendronate (FOSAMAX) 70 MG tablet TAKE 1 TABLET(70 MG) BY MOUTH EVERY 7  DAYS WITH A FULL GLASS OF WATER AND ON AN EMPTY STOMACH   atorvastatin (LIPITOR) 40 MG tablet TAKE 1 TABLET(40 MG) BY MOUTH DAILY   clopidogrel (PLAVIX) 75 MG tablet TAKE 1 TABLET(75 MG) BY MOUTH DAILY   gabapentin (NEURONTIN) 100 MG capsule TAKE 1 TO 3 CAPSULES BY MOUTH THREE TIMES DAILY AS NEEDED FOR NERVE PAIN OR TINGLING   hydrochlorothiazide (HYDRODIURIL) 12.5 MG tablet TAKE 1 TABLET(12.5 MG) BY MOUTH DAILY   losartan (COZAAR) 100 MG tablet TAKE 1 TABLET(100 MG) BY MOUTH DAILY   sertraline (ZOLOFT) 50 MG tablet TAKE 1 TABLET(50 MG) BY MOUTH DAILY   No current facility-administered medications on file prior to visit.  There are no discontinued medications.    Objective   Physical Exam     01/01/2023    2:08 PM 11/24/2022    7:48 AM 06/04/2022   12:59 PM  Vitals with BMI  Height 5\' 0"     Weight 158 lbs 14 oz 155 lbs   BMI 31.03    Systolic 120  151  Diastolic 88  80  Pulse 118  104   Wt Readings from Last 10 Encounters:  01/01/23 158 lb 14.4 oz (72.1 kg)  11/24/22 155 lb (70.3 kg)  05/21/22 155 lb 6.4 oz (70.5 kg)  03/24/22 156 lb (70.8 kg)  03/17/22 149 lb 6.1 oz (67.8 kg)  11/13/21 152 lb (68.9 kg)  10/02/21 150 lb (68 kg)  08/19/21 150 lb 8 oz (68.3 kg)  08/14/21 150 lb (68 kg)  05/13/21 154 lb (69.9 kg)   Vital signs reviewed.  Nursing notes reviewed. Weight trend reviewed. Abnormalities and Problem-Specific physical exam findings:  no abdomen tenderness, no cervical lymphadenopathy, cerumen impaction R ear.  General Appearance:  No acute distress appreciable.   Well-groomed, healthy-appearing female.  Well proportioned with no abnormal fat distribution.  Good muscle tone. Pulmonary:  Normal work of breathing at rest, no respiratory distress apparent. SpO2: 95 %  Musculoskeletal: All extremities are intact.  Neurological:  Awake, alert, oriented, and engaged.  No obvious focal neurological deficits or cognitive impairments.  Sensorium seems unclouded.   Speech is  clear and coherent with logical content. Psychiatric:  Appropriate mood, pleasant and cooperative demeanor, thoughtful and engaged during the exam    No results found for any visits on 01/01/23. Office Visit on 03/17/2022  Component Date Value   Vitamin B-12 03/17/2022 341    Total CK 03/17/2022 159   No image results found. No results found.DG INJECT DIAG/THERA/INC NEEDLE/CATH/PLC EPI/LUMB/SAC W/IMG Result Date: 06/04/2022 CLINICAL DATA:  Lumbosacral spondylosis without myelopathy. Lumbar radiculitis. Right greater than left lower extremity numbness. Clinically suspected L5 radiculopathy. Prior L2-L5 fusion. FLUOROSCOPY: Radiation Exposure Index (as provided by the fluoroscopic device): 3.50 mGy Kerma PROCEDURE: The procedure, risks, benefits, and alternatives were explained to the patient. Questions regarding the procedure were encouraged and answered. The patient understands and consents to the procedure. LUMBAR EPIDURAL INJECTION: An interlaminar approach was performed on the right at L5-S1. The overlying skin was cleansed and anesthetized. A 3.5 inch 20 gauge epidural needle was advanced using loss-of-resistance technique. DIAGNOSTIC EPIDURAL INJECTION: Injection of Isovue-M 200 shows a good epidural pattern with spread above and below the level of needle placement, primarily on the right.  No vascular opacification is seen. THERAPEUTIC EPIDURAL INJECTION: 80 mg of Depo-Medrol mixed with 3 mL of 1% lidocaine were instilled. The procedure was well-tolerated, and the patient was discharged thirty minutes following the injection in good condition. COMPLICATIONS: None immediate IMPRESSION: Technically successful interlaminar epidural injection on the right at L5-S1. Electronically Signed   By: Sebastian Ache M.D.   On: 06/04/2022 13:14        This document was synthesized by artificial intelligence (Abridge) using HIPAA-compliant recording of the clinical interaction;   We discussed the use of AI  scribe software for clinical note transcription with the patient, who gave verbal consent to proceed.    Additional Info: This encounter employed state-of-the-art, real-time, collaborative documentation. The patient actively reviewed and assisted in updating their electronic medical record on a shared screen, ensuring transparency and facilitating joint problem-solving for the problem list, overview, and plan. This approach promotes accurate, informed care. The treatment plan was discussed and reviewed in detail, including medication safety, potential side effects, and all patient questions. We confirmed understanding and comfort with the plan. Follow-up instructions were established, including contacting the office for any concerns, returning if symptoms worsen, persist, or new symptoms develop, and precautions for potential emergency department visits.

## 2023-01-01 NOTE — Telephone Encounter (Signed)
Copied from CRM [id number]. Topic: Clinical - Red Word Triage >> Jan 01, 2023 12:01 PM Kathryne Eriksson wrote: Red Word that prompted transfer to Nurse Triage: Patient states she's experiencing rectal bleeding, which is almost like having a menstrual.   Chief Complaint: Rectal bleeding Symptoms: Rectal bleeding Frequency: Started a month ago, worsened today Pertinent Negatives: Patient denies abdominal pain, fever, and vomiting Disposition: [] ED /[] Urgent Care (no appt availability in office) / [x] Appointment(In office/virtual)/ []  [facility]/ [] Home Care/ [] Refused Recommended Disposition /[] [facility]/ []  Follow-up with PCP Additional Notes: Patient called in complaining of rectal bleeding. Bleeding has been occurring for about a month, but worsened this morning. Patient described blood as bright red with small clots. Patient stated that she has a history of a fallen vaginal wall and hemorrhoids, but states that this feels different than previous experiences. Patient has been wearing a pad to contain the bleeding. Patient denies abdominal pain, fever, and vomiting. Patient has been having normal BMs and denies diarrhea and constipation. Advised patient to be seen by a provider within 24 hours. Scheduled patient an in office appointment for today. Patient complied. Advised patient to continue to wear a pad, maintain normal dietary intake, and call back if symptoms worsen.   Reason for Disposition  MODERATE rectal bleeding (small blood clots, passing blood without stool, or toilet water turns red)  Answer Assessment - Initial Assessment Questions 1. APPEARANCE of BLOOD: "What color is it?" "Is it passed separately, on the surface of the stool, or mixed in with the stool?"      Bright red, occasionally contains clots similar to a period  2. AMOUNT: "How much blood was passed?"      Patient cannot describe an exact amount but is wearing pad to contain the bleeding  3.  FREQUENCY: "How many times has blood been passed with the stools?"      Patient stated that she started seeing blood about a month ago, but it worsened with a BM this morning  4. ONSET: "When was the blood first seen in the stools?" (Days or weeks)      "A while ago" ago, patient could see blood in stools starting a month ago  5. DIARRHEA: "Is there also some diarrhea?" If Yes, ask: "How many diarrhea stools in the past 24 hours?"      No  6. CONSTIPATION: "Do you have constipation?" If Yes, ask: "How bad is it?"     No  7. RECURRENT SYMPTOMS: "Have you had blood in your stools before?" If Yes, ask: "When was the last time?" and "What happened that time?"      Yes, but clotting and moderate bleeding is new as of this morning  8. BLOOD THINNERS: "Do you take any blood thinners?" (e.g., Coumadin/warfarin, Pradaxa/dabigatran, aspirin)     Plavix  9. OTHER SYMPTOMS: "Do you have any other symptoms?"  (e.g., abdomen pain, vomiting, dizziness, fever)     Denies abdominal pain, fever, and vomiting  Protocols used: Rectal Bleeding-A-AH

## 2023-01-01 NOTE — Hospital Course (Signed)
[name] is a 82 y.o. female with medical history significant for history of CVA on Plavix, HTN, HLD, anxiety who is admitted for evaluation of lower GI bleeding.

## 2023-01-01 NOTE — ED Triage Notes (Signed)
Pt BIB EMS for rectal bleeding that started over a month ago but has gotten worse over the past 2 days, increased clots. Bright red blood. PCP wanted pt to go to hospital. Axox4. VSS.

## 2023-01-01 NOTE — ED Provider Notes (Signed)
[place] [facility]MENT AT [facility] Provider Note   CSN: [id number] Arrival date & time: 01/01/23  1541     History {Add pertinent medical, surgical, social history, OB history to HPI:1} Chief Complaint  Patient presents with  . Rectal Bleeding    [name] is a 82 y.o. female.  She was brought in by ambulance from her primary care doctor's office.  She had been experiencing on and off rectal bleeding for the last month or so.  Was generally just was blood on the toilet paper when she wiped after a bowel movement.  Since this morning she said she has had heavier bleeding with some clots not associated with a bowel movement.  She denies any dizziness chest pain shortness of breath abdominal pain.  She is on Plavix.  She was tachycardic there at the primary care doctor's office so they felt she needed to come here for further evaluation.  She said she seen [facility] for colonoscopy probably greater than 5 years ago and has not had any ongoing GI issues.  She has not seen any other overt bleeding  The history is provided by the patient.  Rectal Bleeding Quality:  Bright red Duration:  1 month Timing:  Intermittent Chronicity:  New Context: defecation   Similar prior episodes: no   Relieved by:  None tried Worsened by:  Nothing Ineffective treatments:  None tried Associated symptoms: no abdominal pain, no dizziness, no epistaxis, no fever, no hematemesis, no light-headedness, no loss of consciousness and no vomiting   Risk factors: anticoagulant use        Home Medications Prior to Admission medications   Medication Sig Start Date End Date Taking? Authorizing Provider  alendronate (FOSAMAX) 70 MG tablet TAKE 1 TABLET(70 MG) BY MOUTH EVERY 7 DAYS WITH A FULL GLASS OF WATER AND ON AN EMPTY STOMACH 11/03/22   Jarold Motto, PA  atorvastatin (LIPITOR) 40 MG tablet TAKE 1 TABLET(40 MG) BY MOUTH DAILY 12/16/22   Jarold Motto, PA  clopidogrel  (PLAVIX) 75 MG tablet TAKE 1 TABLET(75 MG) BY MOUTH DAILY 09/15/22   Jarold Motto, PA  gabapentin (NEURONTIN) 100 MG capsule TAKE 1 TO 3 CAPSULES BY MOUTH THREE TIMES DAILY AS NEEDED FOR NERVE PAIN OR TINGLING 12/07/22   Rodolph Bong, MD  hydrochlorothiazide (HYDRODIURIL) 12.5 MG tablet TAKE 1 TABLET(12.5 MG) BY MOUTH DAILY 12/16/22   Jarold Motto, PA  losartan (COZAAR) 100 MG tablet TAKE 1 TABLET(100 MG) BY MOUTH DAILY 12/16/22   Jarold Motto, PA  sertraline (ZOLOFT) 50 MG tablet TAKE 1 TABLET(50 MG) BY MOUTH DAILY 12/16/22   Jarold Motto, PA      Allergies    Lisinopril    Review of Systems   Review of Systems  Constitutional:  Negative for fever.  HENT:  Negative for nosebleeds.   Respiratory:  Negative for shortness of breath.   Cardiovascular:  Negative for chest pain.  Gastrointestinal:  Positive for hematochezia. Negative for abdominal pain, hematemesis and vomiting.  Neurological:  Negative for dizziness, loss of consciousness and light-headedness.    Physical Exam Updated Vital Signs BP (!) 149/99 (BP Location: Left Arm)   Pulse (!) 114   Temp 98 F (36.7 C) (Oral)   Resp 18   Ht 5' (1.524 m)   Wt 71.7 kg   SpO2 94%   BMI 30.86 kg/m  Physical Exam Vitals and nursing note reviewed.  Constitutional:      General: She is not in  acute distress.    Appearance: Normal appearance. She is well-developed.  HENT:     Head: Normocephalic and atraumatic.  Eyes:     Conjunctiva/sclera: Conjunctivae normal.  Cardiovascular:     Rate and Rhythm: Regular rhythm. Tachycardia present.     Heart sounds: No murmur heard. Pulmonary:     Effort: Pulmonary effort is normal. No respiratory distress.     Breath sounds: Normal breath sounds.  Abdominal:     Palpations: Abdomen is soft.     Tenderness: There is no abdominal tenderness. There is no guarding or rebound.  Musculoskeletal:        General: No deformity. Normal range of motion.     Cervical back: Neck supple.   Skin:    General: Skin is warm and dry.     Capillary Refill: Capillary refill takes less than 2 seconds.  Neurological:     General: No focal deficit present.     Mental Status: She is alert.     Sensory: No sensory deficit.     Motor: No weakness.    ED Results / Procedures / Treatments   Labs (all labs ordered are listed, but only abnormal results are displayed) Labs Reviewed - No data to display  EKG None  Radiology No results found.  Procedures Procedures  {Document cardiac monitor, telemetry assessment procedure when appropriate:1}  Medications Ordered in ED Medications  pantoprazole (PROTONIX) injection 40 mg (has no administration in time range)  sodium chloride 0.9 % bolus 500 mL (has no administration in time range)    ED Course/ Medical Decision Making/ A&P   {   Click here for ABCD2, HEART and other calculatorsREFRESH Note before signing :1}                              Medical Decision Making Amount and/or Complexity of Data Reviewed Labs: ordered.  Risk Prescription drug management.   This patient complains of ***; this involves an extensive number of treatment Options and is a complaint that carries with it a high risk of complications and morbidity. The differential includes ***  I ordered, reviewed and interpreted labs, which included *** I ordered medication *** and reviewed PMP when indicated. I ordered imaging studies which included *** and I independently    visualized and interpreted imaging which showed *** Additional history obtained from *** Previous records obtained and reviewed *** I consulted *** and discussed lab and imaging findings and discussed disposition.  Cardiac monitoring reviewed, *** Social determinants considered, *** Critical Interventions: ***  After the interventions stated above, I reevaluated the patient and found *** Admission and further testing considered, ***   {Document critical care time when  appropriate:1} {Document review of labs and clinical decision tools ie heart score, Chads2Vasc2 etc:1}  {Document your independent review of radiology images, and any outside records:1} {Document your discussion with family members, caretakers, and with consultants:1} {Document social determinants of health affecting pt's care:1} {Document your decision making why or why not admission, treatments were needed:1} Final Clinical Impression(s) / ED Diagnoses Final diagnoses:  None    Rx / DC Orders ED Discharge Orders     None

## 2023-01-02 DIAGNOSIS — Z87820 Personal history of traumatic brain injury: Secondary | ICD-10-CM

## 2023-01-02 DIAGNOSIS — Z860100 Personal history of colon polyps, unspecified: Secondary | ICD-10-CM | POA: Diagnosis not present

## 2023-01-02 DIAGNOSIS — K625 Hemorrhage of anus and rectum: Secondary | ICD-10-CM | POA: Diagnosis not present

## 2023-01-02 DIAGNOSIS — Z800 Family history of malignant neoplasm of digestive organs: Secondary | ICD-10-CM | POA: Diagnosis not present

## 2023-01-02 LAB — BASIC METABOLIC PANEL
Anion gap: 6 (ref 5–15)
BUN: 17 mg/dL (ref 8–23)
CO2: 25 mmol/L (ref 22–32)
Calcium: 8.3 mg/dL — ABNORMAL LOW (ref 8.9–10.3)
Chloride: 105 mmol/L (ref 98–111)
Creatinine, Ser: 0.76 mg/dL (ref 0.44–1.00)
GFR, Estimated: >60 mL/min (ref >60)
Glucose, Bld: 103 mg/dL — ABNORMAL HIGH (ref 70–99)
Potassium: 3.8 mmol/L (ref 3.5–5.1)
Sodium: 136 mmol/L (ref 135–145)

## 2023-01-02 LAB — CBC
HCT: 36.9 % (ref 36.0–46.0)
Hemoglobin: 12.1 g/dL (ref 12.0–15.0)
MCH: 30.1 pg (ref 26.0–34.0)
MCHC: 32.8 g/dL (ref 30.0–36.0)
MCV: 91.8 fL (ref 80.0–100.0)
Platelets: 227 10*3/uL (ref 150–400)
RBC: 4.02 MIL/uL (ref 3.87–5.11)
RDW: 13.2 % (ref 11.5–15.5)
WBC: 7.6 10*3/uL (ref 4.0–10.5)
nRBC: 0 % (ref 0.0–0.2)

## 2023-01-02 MED ORDER — POLYETHYLENE GLYCOL 3350 17 G PO PACK
17.0000 g | PACK | Freq: Two times a day (BID) | ORAL | Status: DC
  Administered 2023-01-02 – 2023-01-04 (×2): 17 g via ORAL
  Filled 2023-01-02 (×3): qty 1

## 2023-01-02 NOTE — Progress Notes (Signed)
Patient arrived to the room with her glasses in place, watch, purse and a bag of personal clothing at bedside. Patient is oriented x4, denies pain. HR 109, other VS stable. Patient denies pain. Patient was transferred to the bed. Patient was oriented to the room, call bell and staffs. Bed alarm initiated, call bell within reach.  Edra Riccardi

## 2023-01-02 NOTE — Care Management Obs Status (Signed)
MEDICARE OBSERVATION STATUS NOTIFICATION   Patient Details  Name: [name] MRN: [id number] Date of Birth: 04/17/1940   Medicare Observation Status Notification Given:  Yes    Lawerance Sabal, RN 01/02/2023, 9:53 AM

## 2023-01-02 NOTE — Progress Notes (Signed)
PROGRESS NOTE  [name] GMW:[id number] DOB: 1940-10-25 DOA: 01/01/2023 PCP: Jarold Motto, PA   LOS: 0 days   Brief Narrative / Interim history: 82 year old female with prior CVA on Plavix, HTN, HLD, anxiety who comes into the hospital with rectal bleeding.  This is been going on for the past month, however in the last day was worse than normal.  She denies any abdominal pain, nausea, vomiting.  Subjective / 24h Interval events: Doing well this morning, has not had a bowel movement since being in the room  Assesement and Plan: Principal Problem:   BRBPR (bright red blood per rectum) Active Problems:   HLD (hyperlipidemia)   Benign essential HTN   Anxiety   History of CVA (cerebrovascular accident)  Principal problem Lower GI bleeding-n.p.o. now, gastroenterology consulted.  CT abdomen pelvis without acute processes.  Hemoglobin stable but did drop about a point -GI recommends colonoscopy after Plavix wash out, given daily bleeding for the past month, will monitor here until colonoscopy is done  Active problems History of CVA - Holding Plavix.  Continue atorvastatin.   Hypertension - Holding losartan and HCTZ for now.   Hyperlipidemia - Continue atorvastatin.   Anxiety - Continue Zoloft.  Scheduled Meds:  atorvastatin  40 mg Oral Daily   sertraline  50 mg Oral Daily   Continuous Infusions: PRN Meds:.acetaminophen **OR** acetaminophen, ondansetron **OR** ondansetron (ZOFRAN) IV, polyethylene glycol  Current Outpatient Medications  Medication Instructions   alendronate (FOSAMAX) 70 MG tablet TAKE 1 TABLET(70 MG) BY MOUTH EVERY 7 DAYS WITH A FULL GLASS OF WATER AND ON AN EMPTY STOMACH   atorvastatin (LIPITOR) 40 MG tablet TAKE 1 TABLET(40 MG) BY MOUTH DAILY   clopidogrel (PLAVIX) 75 MG tablet TAKE 1 TABLET(75 MG) BY MOUTH DAILY   gabapentin (NEURONTIN) 100 MG capsule TAKE 1 TO 3 CAPSULES BY MOUTH THREE TIMES DAILY AS NEEDED FOR NERVE PAIN OR TINGLING    hydrochlorothiazide (HYDRODIURIL) 12.5 MG tablet TAKE 1 TABLET(12.5 MG) BY MOUTH DAILY   losartan (COZAAR) 100 MG tablet TAKE 1 TABLET(100 MG) BY MOUTH DAILY   sertraline (ZOLOFT) 50 MG tablet TAKE 1 TABLET(50 MG) BY MOUTH DAILY    Diet Orders (From admission, onward)     Start     Ordered   01/02/23 0001  Diet NPO time specified  Diet effective midnight        01/01/23 1911            DVT prophylaxis: SCDs Start: 01/01/23 2007   Lab Results  Component Value Date   PLT 227 01/02/2023      Code Status: Full Code  Family Communication: No family at bedside  Status is: Observation The patient will require care spanning > 2 midnights and should be moved to inpatient because: GI bleed, colonoscopy tuesday   Level of care: Med-Surg  Consultants:  Gastroenterology  Objective: Vitals:   01/01/23 2143 01/01/23 2341 01/02/23 0541 01/02/23 0727  BP:  (!) 146/78 (!) 140/77 139/70  Pulse:  95 99 87  Resp:  16 19 16   Temp:  98.6 F (37 C) 98 F (36.7 C) 98.2 F (36.8 C)  TempSrc:  Oral Oral Oral  SpO2: 94% 92% 93% 92%  Weight:      Height:        Intake/Output Summary (Last 24 hours) at 01/02/2023 0956 Last data filed at 01/02/2023 0715 Gross per 24 hour  Intake 1073.77 ml  Output 3 ml  Net 1070.77 ml   Wt Readings  from Last 3 Encounters:  01/01/23 71.7 kg  01/01/23 72.1 kg  11/24/22 70.3 kg    Examination:  Constitutional: NAD Eyes: no scleral icterus ENMT: Mucous membranes are moist.  Neck: normal, supple Respiratory: clear to auscultation bilaterally, no wheezing, no crackles. Normal respiratory effort. No accessory muscle use.  Cardiovascular: Regular rate and rhythm, no murmurs / rubs / gallops. No LE edema.  Abdomen: non distended, no tenderness. Bowel sounds positive.  Musculoskeletal: no clubbing / cyanosis.   Data Reviewed: I have independently reviewed following labs and imaging studies   CBC Recent Labs  Lab 01/01/23 1600 01/02/23 0347   WBC 9.1 7.6  HGB 13.7 12.1  HCT 42.4 36.9  PLT 242 227  MCV 93.6 91.8  MCH 30.2 30.1  MCHC 32.3 32.8  RDW 13.2 13.2  LYMPHSABS 1.2  --   MONOABS 0.6  --   EOSABS 0.0  --   BASOSABS 0.0  --     Recent Labs  Lab 01/01/23 1600 01/02/23 0347  NA 137 136  K 3.8 3.8  CL 107 105  CO2 23 25  GLUCOSE 112* 103*  BUN 21 17  CREATININE 0.75 0.76  CALCIUM 9.1 8.3*  AST 28  --   ALT 25  --   ALKPHOS 141*  --   BILITOT 0.8  --   ALBUMIN 4.2  --   INR 1.0  --     ------------------------------------------------------------------------------------------------------------------ No results for input(s): "CHOL", "HDL", "LDLCALC", "TRIG", "CHOLHDL", "LDLDIRECT" in the last 72 hours.  Lab Results  Component Value Date   HGBA1C 6.1 08/19/2021   ------------------------------------------------------------------------------------------------------------------ No results for input(s): "TSH", "T4TOTAL", "T3FREE", "THYROIDAB" in the last 72 hours.  Invalid input(s): "FREET3"  Cardiac Enzymes No results for input(s): "CKMB", "TROPONINI", "MYOGLOBIN" in the last 168 hours.  Invalid input(s): "CK" ------------------------------------------------------------------------------------------------------------------ No results found for: "BNP"  CBG: No results for input(s): "GLUCAP" in the last 168 hours.  No results found for this or any previous visit (from the past 240 hours).   Radiology Studies: CT ABDOMEN PELVIS W CONTRAST Result Date: 01/01/2023 CLINICAL DATA:  Lower GI bleeding.  Rectal bleeding. EXAM: CT ABDOMEN AND PELVIS WITH CONTRAST TECHNIQUE: Multidetector CT imaging of the abdomen and pelvis was performed using the standard protocol following bolus administration of intravenous contrast. RADIATION DOSE REDUCTION: This exam was performed according to the departmental dose-optimization program which includes automated exposure control, adjustment of the mA and/or kV according  to patient size and/or use of iterative reconstruction technique. CONTRAST:  75mL OMNIPAQUE IOHEXOL 350 MG/ML SOLN COMPARISON:  CT abdomen 11/10/2016. CT abdomen and pelvis 11/20/2004 FINDINGS: Lower chest: Mild atelectasis in the lung bases. Hepatobiliary: No focal liver abnormality is seen. Status post cholecystectomy. Bile ducts are normal for postoperative physiology. Pancreas: Unremarkable. No pancreatic ductal dilatation or surrounding inflammatory changes. Spleen: Normal in size without focal abnormality. Adrenals/Urinary Tract: Adrenal glands are unremarkable. Bilateral parenchymal and parapelvic cysts measuring up to 1.5 cm diameter. No imaging follow-up is indicated. Kidneys are otherwise normal, without renal calculi, focal solid lesion, or hydronephrosis. Bladder is unremarkable. Stomach/Bowel: Stomach, small bowel, and colon are not abnormally distended. No wall thickening or inflammatory changes. Colonic diverticulosis without evidence of acute diverticulitis. Appendix is not identified. Vascular/Lymphatic: Aortic atherosclerosis. No enlarged abdominal or pelvic lymph nodes. Reproductive: Status post hysterectomy. No adnexal masses. Other: No abdominal wall hernia or abnormality. No abdominopelvic ascites. Musculoskeletal: Postoperative changes with posterior fixation and intervertebral fusion from L2 through L5. No acute bony abnormalities. IMPRESSION: 1.  No acute process demonstrated in the abdomen or pelvis. No evidence of bowel obstruction or inflammation. Colonic diverticulosis without evidence of acute diverticulitis. Aortic atherosclerosis. Electronically Signed   By: Burman Nieves M.D.   On: 01/01/2023 20:21     Pamella Pert, MD, PhD [facility]  Between 7 am - 7 pm I am available, please contact me via Amion (for emergencies) or Securechat (non urgent messages)  Between 7 pm - 7 am I am not available, please contact night coverage MD/APP via Amion

## 2023-01-02 NOTE — Consult Note (Signed)
Consultation  Referring Provider:   [facility] Primary Care Physician:  Jarold Motto, [place] Primary Gastroenterologist:  Dr. Juanda Chance remote history       Reason for Consultation:   Painless rectal bleeding in setting of plavix DOA: 01/01/2023         Hospital Day: 2         HPI:   [name] is a 82 y.o. female with past medical history significant for CVA with dysarthria, on Plavix, hyperlipidemia, hypertension, osteoporosis, personal history of colon polyps  09/2004 colonoscopy with Dr. Lina Sar due to family history of colon polyps and family history of colon cancer good prep with MiraLAX shows diverticulosis descending sigmoid colon moderately severe, 30 mm pedunculated polyp in the sigmoid colon piecemeal removal.  Recall 3 years, pathology with tubular adenomatous polyp   She presents to the ER with rectal bleeding intermittently but worsening spontaneous bleeding with clots that brought her to the ER denies any rectal pain or abdominal pain.  CTA shows no acute process, colonic diverticulosis without diverticulitis Work up notable for  Stable HD FOBT positive INR 1, Hgb 13.7 recheck this morning 12.1, no leukocytosis normal platelets normocytic. Alk phos 141, normal kidney BUN 21  No family was present at the time of my evaluation. Patient states maternal aunt with history of colon cancer Another colonoscopy since 2006 with 30 mm pedunculated TA polyp polyp removed, has been having normal bowel habits daily but over the past month has been having intermittent rectal bleeding bright red denies rectal pain/abdominal pain.  She states she has a cystocele and does occasionally have to manually push vaginally in order to have a bowel movement, she is never seen blood before, then yesterday she had 3-4 bowel movements soft with increasing bright red blood with some clots so she presented to the ER.  She also had 1 episode of some dripping blood onto the floor. She denies  GERD, nausea vomiting, weight loss, fever chills. Last bowel movement yesterday has not had any here does feel the need.  Denies NSAIDs she is on Plavix but the last dose was Thursday morning she has not taken any of her medications yesterday. Denies any chest pain shortness of breath.  Abnormal ED labs: Abnormal Labs Reviewed  COMPREHENSIVE METABOLIC PANEL - Abnormal; Notable for the following components:      Result Value   Glucose, Bld 112 (*)    Alkaline Phosphatase 141 (*)    All other components within normal limits  CBC WITH DIFFERENTIAL/PLATELET - Abnormal; Notable for the following components:   Abs Immature Granulocytes 0.08 (*)    All other components within normal limits  BASIC METABOLIC PANEL - Abnormal; Notable for the following components:   Glucose, Bld 103 (*)    Calcium 8.3 (*)    All other components within normal limits  POC OCCULT BLOOD, ED - Abnormal; Notable for the following components:   Fecal Occult Bld POSITIVE (*)    All other components within normal limits    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    HLD (hyperlipidemia)    Melanoma in situ (HCC)    Vitamin D deficiency     Surgical History:  She  has a past surgical history that includes Cholecystectomy (2006); Back surgery; IR GASTROSTOMY TUBE MOD SED (11/17/2016); IR GASTROSTOMY TUBE  REMOVAL/REPAIR (01/18/2017); and S/P Hysterectomy (1990). Family History:  Her family history includes Anxiety disorder in her son; Colon cancer in her maternal aunt; Diabetes Mellitus II in her mother; Heart disease in her father and mother; Hyperlipidemia in her father and mother; Hypertension in her father, mother, and son; Stroke in her father and mother. Social History:   reports that she has never smoked. She has never used smokeless tobacco. She reports that she does not drink alcohol and does not use  drugs.  Prior to Admission medications   Medication Sig Start Date End Date Taking? Authorizing Provider  alendronate (FOSAMAX) 70 MG tablet TAKE 1 TABLET(70 MG) BY MOUTH EVERY 7 DAYS WITH A FULL GLASS OF WATER AND ON AN EMPTY STOMACH 11/03/22  Yes Bufford Buttner, Lelon Mast, PA  atorvastatin (LIPITOR) 40 MG tablet TAKE 1 TABLET(40 MG) BY MOUTH DAILY 12/16/22  Yes Jarold Motto, PA  clopidogrel (PLAVIX) 75 MG tablet TAKE 1 TABLET(75 MG) BY MOUTH DAILY 09/15/22  Yes Jarold Motto, PA  gabapentin (NEURONTIN) 100 MG capsule TAKE 1 TO 3 CAPSULES BY MOUTH THREE TIMES DAILY AS NEEDED FOR NERVE PAIN OR TINGLING 12/07/22  Yes Rodolph Bong, MD  hydrochlorothiazide (HYDRODIURIL) 12.5 MG tablet TAKE 1 TABLET(12.5 MG) BY MOUTH DAILY 12/16/22  Yes Jarold Motto, PA  losartan (COZAAR) 100 MG tablet TAKE 1 TABLET(100 MG) BY MOUTH DAILY 12/16/22  Yes Jarold Motto, PA  sertraline (ZOLOFT) 50 MG tablet TAKE 1 TABLET(50 MG) BY MOUTH DAILY 12/16/22  Yes Jarold Motto, PA    Current Facility-Administered Medications  Medication Dose Route Frequency Provider Last Rate Last Admin   acetaminophen (TYLENOL) tablet 650 mg  650 mg Oral Q6H PRN Charlsie Quest, MD       Or   acetaminophen (TYLENOL) suppository 650 mg  650 mg Rectal Q6H PRN Charlsie Quest, MD       atorvastatin (LIPITOR) tablet 40 mg  40 mg Oral Daily Charlsie Quest, MD   40 mg at 01/02/23 0847   ondansetron (ZOFRAN) tablet 4 mg  4 mg Oral Q6H PRN Charlsie Quest, MD       Or   ondansetron (ZOFRAN) injection 4 mg  4 mg Intravenous Q6H PRN Charlsie Quest, MD       polyethylene glycol (MIRALAX / GLYCOLAX) packet 17 g  17 g Oral Daily PRN Charlsie Quest, MD       sertraline (ZOLOFT) tablet 50 mg  50 mg Oral Daily Charlsie Quest, MD   50 mg at 01/02/23 0847    Allergies as of 01/01/2023 - Review Complete 01/01/2023  Allergen Reaction Noted   Lisinopril Other (See Comments) 05/20/2016    Review of Systems:    Constitutional: No weight loss,  fever, chills, weakness or fatigue HEENT: Eyes: No change in vision               Ears, Nose, Throat:  No change in hearing or congestion Skin: No rash or itching Cardiovascular: No chest pain, chest pressure or palpitations   Respiratory: No SOB or cough Gastrointestinal: See HPI and otherwise negative Genitourinary: No dysuria or change in urinary frequency Neurological: No headache, dizziness or syncope Musculoskeletal: No new muscle or joint pain Hematologic: No bleeding or bruising Psychiatric: No history of depression or anxiety     Physical Exam:  Vital signs in last 24 hours: Temp:  [97.4 F (36.3 C)-98.6 F (37 C)] 98.2 F (36.8 C) (12/21 0727) Pulse Rate:  [87-118] 87 (12/21 0727)  Resp:  [16-23] 16 (12/21 0727) BP: (120-158)/(70-99) 139/70 (12/21 0727) SpO2:  [92 %-97 %] 92 % (12/21 0727) Weight:  [71.7 kg-72.1 kg] 71.7 kg (12/20 1555) Last BM Date : 01/02/23 Last BM recorded by nurses in past 5 days Stool Type: Type 1 (Separate hard lumps) (01/02/2023 12:00 AM)  General:   Pleasant, well developed female in no acute distress Head:  Normocephalic and atraumatic. Eyes: sclerae anicteric,conjunctive pink  Heart:  regular rate and rhythm, no MRG Pulm: Clear anteriorly; no wheezing Abdomen:  Soft, Obese AB, Active bowel sounds. No tenderness . Without guarding and Without rebound, No organomegaly appreciated. Rectal exam: Hemorrhoidal skin tags, slight really thrombosed erythematous right posterior hemorrhoid with possible small fissures, rectal exam with decreased rectal tone, cavernous rectum with large volume soft brown stool specks of blood no masses appreciated, did some manual disimpaction but still large volume soft stool. Extremities:  Without edema. Msk:  Symmetrical without gross deformities. Peripheral pulses intact.  Neurologic:  Alert and  oriented x4;  No focal deficits.  Skin:   Dry and intact without significant lesions or rashes. Psychiatric:   Cooperative. Normal mood and affect.  LAB RESULTS: Recent Labs    01/01/23 1600 01/02/23 0347  WBC 9.1 7.6  HGB 13.7 12.1  HCT 42.4 36.9  PLT 242 227   BMET Recent Labs    01/01/23 1600 01/02/23 0347  NA 137 136  K 3.8 3.8  CL 107 105  CO2 23 25  GLUCOSE 112* 103*  BUN 21 17  CREATININE 0.75 0.76  CALCIUM 9.1 8.3*   LFT Recent Labs    01/01/23 1600  PROT 7.4  ALBUMIN 4.2  AST 28  ALT 25  ALKPHOS 141*  BILITOT 0.8   PT/INR Recent Labs    01/01/23 1600  LABPROT 12.9  INR 1.0    STUDIES: CT ABDOMEN PELVIS W CONTRAST Result Date: 01/01/2023 CLINICAL DATA:  Lower GI bleeding.  Rectal bleeding. EXAM: CT ABDOMEN AND PELVIS WITH CONTRAST TECHNIQUE: Multidetector CT imaging of the abdomen and pelvis was performed using the standard protocol following bolus administration of intravenous contrast. RADIATION DOSE REDUCTION: This exam was performed according to the departmental dose-optimization program which includes automated exposure control, adjustment of the mA and/or kV according to patient size and/or use of iterative reconstruction technique. CONTRAST:  75mL OMNIPAQUE IOHEXOL 350 MG/ML SOLN COMPARISON:  CT abdomen 11/10/2016. CT abdomen and pelvis 11/20/2004 FINDINGS: Lower chest: Mild atelectasis in the lung bases. Hepatobiliary: No focal liver abnormality is seen. Status post cholecystectomy. Bile ducts are normal for postoperative physiology. Pancreas: Unremarkable. No pancreatic ductal dilatation or surrounding inflammatory changes. Spleen: Normal in size without focal abnormality. Adrenals/Urinary Tract: Adrenal glands are unremarkable. Bilateral parenchymal and parapelvic cysts measuring up to 1.5 cm diameter. No imaging follow-up is indicated. Kidneys are otherwise normal, without renal calculi, focal solid lesion, or hydronephrosis. Bladder is unremarkable. Stomach/Bowel: Stomach, small bowel, and colon are not abnormally distended. No wall thickening or  inflammatory changes. Colonic diverticulosis without evidence of acute diverticulitis. Appendix is not identified. Vascular/Lymphatic: Aortic atherosclerosis. No enlarged abdominal or pelvic lymph nodes. Reproductive: Status post hysterectomy. No adnexal masses. Other: No abdominal wall hernia or abnormality. No abdominopelvic ascites. Musculoskeletal: Postoperative changes with posterior fixation and intervertebral fusion from L2 through L5. No acute bony abnormalities. IMPRESSION: 1. No acute process demonstrated in the abdomen or pelvis. No evidence of bowel obstruction or inflammation. Colonic diverticulosis without evidence of acute diverticulitis. Aortic atherosclerosis. Electronically Signed   By: Chrissie Noa  Andria Meuse M.D.   On: 01/01/2023 20:21      Impression/Plan:    Bright red blood per rectum over the past month On Plavix, last dose Thursday morning stable HD, stable hemoglobin 1 g drop (13.7-->12.1) FOBT positive 09/2004 colonoscopy Dr. Dickie La due to family history of colon cancer/polyps showed diverticulosis moderate to severe and 30 mm pedunculated TA polyp piecemeal removed switch to have recall 3 years the patient underwent back CTA no acute process positive colonic diverticulosis, no masses or evidence of lymphadenopathy Possible this is hemorrhoidal bleeding/fissure however with patient's previous colonoscopy with rather large TA polyp without a follow-up since 2006 suggest colonoscopy Patient is agreeable to colonoscopy tomorrow would be day 3 of Plavix washout, would need 5-day washout with anticipation of polypectomy, Dr. Rhea Belton determined if patient will stay in house for Plavix washout versus offering first available outpatient in our office Continue to monitor CBC transfuse greater than 7 Will check iron, ferritin, B12 Will treat known hemorrhoids with hydrocortisone suppositories Patient has some pelvic floor dysfunction suggest MiraLAX, fiber daily, Dulcolax as needed.  CVA  with some dysarthria On Plavix, currently on hold, last dose Thursday morning    Principal Problem:   BRBPR (bright red blood per rectum) Active Problems:   HLD (hyperlipidemia)   Benign essential HTN   Anxiety   History of CVA (cerebrovascular accident)    LOS: 0 days   Thank you for your kind consultation, we will continue to follow.   Doree Albee  01/02/2023, 8:50 AM

## 2023-01-02 NOTE — Plan of Care (Signed)

## 2023-01-02 NOTE — Progress Notes (Signed)
   01/02/23 0903  Mobility  Activity Ambulated with assistance in hallway  Level of Assistance Standby assist, set-up cues, supervision of patient - no hands on  Assistive Device Front wheel walker  Distance Ambulated (ft) 80 ft  Activity Response Tolerated fair  Mobility Referral Yes  Mobility visit 1 Mobility  Mobility Specialist Start Time (ACUTE ONLY) 0847  Mobility Specialist Stop Time (ACUTE ONLY) 0903  Mobility Specialist Time Calculation (min) (ACUTE ONLY) 16 min   Mobility Specialist: Progress Note  Pt agreeable to mobility session - received in bed. Required SV using RW. C/o R foot numbness.  Returned to bed with all needs met - call bell within reach. Bed alarm on. Pt stated she uses a rollator at baseline. Pt opted for RW d/t no rollator present.   Barnie Mort, BS Mobility Specialist Please contact via SecureChat or Rehab office at [phone].

## 2023-01-03 DIAGNOSIS — Z860100 Personal history of colon polyps, unspecified: Secondary | ICD-10-CM | POA: Diagnosis not present

## 2023-01-03 DIAGNOSIS — Z7902 Long term (current) use of antithrombotics/antiplatelets: Secondary | ICD-10-CM

## 2023-01-03 DIAGNOSIS — Z860101 Personal history of adenomatous and serrated colon polyps: Secondary | ICD-10-CM | POA: Diagnosis not present

## 2023-01-03 DIAGNOSIS — K625 Hemorrhage of anus and rectum: Secondary | ICD-10-CM | POA: Diagnosis not present

## 2023-01-03 LAB — CBC
HCT: 41.7 % (ref 36.0–46.0)
Hemoglobin: 13.6 g/dL (ref 12.0–15.0)
MCH: 30.2 pg (ref 26.0–34.0)
MCHC: 32.6 g/dL (ref 30.0–36.0)
MCV: 92.5 fL (ref 80.0–100.0)
Platelets: 280 10*3/uL (ref 150–400)
RBC: 4.51 MIL/uL (ref 3.87–5.11)
RDW: 13.2 % (ref 11.5–15.5)
WBC: 8.8 10*3/uL (ref 4.0–10.5)
nRBC: 0 % (ref 0.0–0.2)

## 2023-01-03 MED ORDER — LOSARTAN POTASSIUM 50 MG PO TABS
50.0000 mg | ORAL_TABLET | Freq: Every day | ORAL | Status: DC
  Administered 2023-01-03 – 2023-01-05 (×3): 50 mg via ORAL
  Filled 2023-01-03 (×3): qty 1

## 2023-01-03 MED ORDER — BISACODYL 5 MG PO TBEC
20.0000 mg | DELAYED_RELEASE_TABLET | Freq: Once | ORAL | Status: AC
  Administered 2023-01-03: 20 mg via ORAL
  Filled 2023-01-03: qty 4

## 2023-01-03 NOTE — Progress Notes (Signed)
   Patient Name: [name] Date of Encounter: 01/03/2023, 9:56 AM     Assessment and Plan  Recurrent rectal bleeding in a patient on Plavix with a history of a large tubular adenoma removed in 2006  and no follow-up since.  Overall stable, plan for colonoscopy Tuesday 1224 when Plavix washout x 5 days has occurred.  Starting clear liquids tonight.  Bisacodyl 20 mg p.o. this afternoon as well to begin prepping.   Subjective  Says she had a smear of bright red blood again today.  Feels well otherwise.  Up and about in the room.   Objective  BP (!) 143/82 (BP Location: Left Arm)   Pulse 94   Temp 98 F (36.7 C) (Oral)   Resp 16   Ht 5' (1.524 m)   Wt 71.7 kg   SpO2 95%   BMI 30.86 kg/m  Elderly white woman no acute distress  Recent Labs  Lab 01/01/23 1600 01/02/23 0347 01/03/23 0856  HGB 13.7 12.1 13.6  HCT 42.4 36.9 41.7  WBC 9.1 7.6 8.8  PLT 242 227 280        Treated with laser Iva Boop, MD, [facility] See Loretha Stapler on call - gastroenterology for best contact person 01/03/2023 9:56 AM

## 2023-01-03 NOTE — Progress Notes (Signed)
   01/03/23 1536  Mobility  Activity Ambulated independently in hallway  Level of Assistance Modified independent, requires aide device or extra time  Assistive Device Front wheel walker  Distance Ambulated (ft) 50 ft  Activity Response Tolerated well  Mobility Referral Yes  Mobility visit 1 Mobility  Mobility Specialist Start Time (ACUTE ONLY) 1530  Mobility Specialist Stop Time (ACUTE ONLY) 1536  Mobility Specialist Time Calculation (min) (ACUTE ONLY) 6 min   Mobility Specialist: Progress Note  Pt agreeable to mobility session - received in bed. Required ModI using RW. Pt was asymptomatic throughout session with no complaints. Returned to bed with all needs met - call bell within reach.   Barnie Mort, BS Mobility Specialist Please contact via SecureChat or Rehab office at [phone].

## 2023-01-03 NOTE — Plan of Care (Signed)

## 2023-01-03 NOTE — Progress Notes (Signed)
PROGRESS NOTE  [name] MWU:[id number] DOB: Sep 04, 1940 DOA: 01/01/2023 PCP: Jarold Motto, PA   LOS: 0 days   Brief Narrative / Interim history: 82 year old female with prior CVA on Plavix, HTN, HLD, anxiety who comes into the hospital with rectal bleeding.  This is been going on for the past month, however in the last day was worse than normal.  She denies any abdominal pain, nausea, vomiting.  Subjective / 24h Interval events: No overnight events, no complaints this morning.  Had a smear of bright red blood  Assesement and Plan: Principal Problem:   BRBPR (bright red blood per rectum) Active Problems:   HLD (hyperlipidemia)   Benign essential HTN   Anxiety   History of CVA (cerebrovascular accident)   History of colonic polyps  Principal problem Lower GI bleeding-n.p.o. now, gastroenterology consulted.  CT abdomen pelvis without acute processes.  Hemoglobin stable -GI recommends colonoscopy after Plavix wash out, given daily bleeding for the past month, will monitor here until colonoscopy is done, tentatively on Tuesday  Active problems History of CVA - Holding Plavix.  Continue atorvastatin.   Hypertension -blood pressure slightly high at times, resume losartan today at half of home dose.  Continue to hold HCTZ   Hyperlipidemia - Continue atorvastatin.   Anxiety - Continue Zoloft.  Scheduled Meds:  atorvastatin  40 mg Oral Daily   bisacodyl  20 mg Oral Once   losartan  50 mg Oral Daily   polyethylene glycol  17 g Oral BID   sertraline  50 mg Oral Daily   Continuous Infusions: PRN Meds:.acetaminophen **OR** acetaminophen, ondansetron **OR** ondansetron (ZOFRAN) IV  Current Outpatient Medications  Medication Instructions   alendronate (FOSAMAX) 70 MG tablet TAKE 1 TABLET(70 MG) BY MOUTH EVERY 7 DAYS WITH A FULL GLASS OF WATER AND ON AN EMPTY STOMACH   atorvastatin (LIPITOR) 40 MG tablet TAKE 1 TABLET(40 MG) BY MOUTH DAILY   clopidogrel (PLAVIX) 75 MG  tablet TAKE 1 TABLET(75 MG) BY MOUTH DAILY   gabapentin (NEURONTIN) 100 MG capsule TAKE 1 TO 3 CAPSULES BY MOUTH THREE TIMES DAILY AS NEEDED FOR NERVE PAIN OR TINGLING   hydrochlorothiazide (HYDRODIURIL) 12.5 MG tablet TAKE 1 TABLET(12.5 MG) BY MOUTH DAILY   losartan (COZAAR) 100 MG tablet TAKE 1 TABLET(100 MG) BY MOUTH DAILY   sertraline (ZOLOFT) 50 MG tablet TAKE 1 TABLET(50 MG) BY MOUTH DAILY    Diet Orders (From admission, onward)     Start     Ordered   01/03/23 1500  Diet clear liquid Room service appropriate? Yes; Fluid consistency: Thin  Diet effective now       Question Answer Comment  Room service appropriate? Yes   Fluid consistency: Thin      01/03/23 0822   01/02/23 1310  DIET SOFT Fluid consistency: Thin  Diet effective now       Question:  Fluid consistency:  Answer:  Thin   01/02/23 1309            DVT prophylaxis: SCDs Start: 01/01/23 2007   Lab Results  Component Value Date   PLT 280 01/03/2023      Code Status: Full Code  Family Communication: No family at bedside  Status is: Observation The patient will require care spanning > 2 midnights and should be moved to inpatient because: GI bleed, colonoscopy tuesday   Level of care: Med-Surg  Consultants:  Gastroenterology  Objective: Vitals:   01/02/23 1646 01/02/23 2023 01/03/23 0436 01/03/23 0730  BP: Marland Kitchen)  146/80 (!) 152/82 132/86 (!) 143/82  Pulse: (!) 101 (!) 107 (!) 103 94  Resp: 16 18 18 16   Temp: 98.2 F (36.8 C) 98.2 F (36.8 C) 97.7 F (36.5 C) 98 F (36.7 C)  TempSrc: Oral Oral Oral Oral  SpO2: 94% 96% 92% 95%  Weight:      Height:        Intake/Output Summary (Last 24 hours) at 01/03/2023 1053 Last data filed at 01/02/2023 1300 Gross per 24 hour  Intake 240 ml  Output --  Net 240 ml   Wt Readings from Last 3 Encounters:  01/01/23 71.7 kg  01/01/23 72.1 kg  11/24/22 70.3 kg    Examination: Constitutional: NAD Eyes: lids and conjunctivae normal, no scleral  icterus ENMT: mmm Neck: normal, supple Respiratory: clear to auscultation bilaterally, no wheezing, no crackles. Normal respiratory effort.  Cardiovascular: Regular rate and rhythm, no murmurs / rubs / gallops. No LE edema. Abdomen: soft, no distention, no tenderness. Bowel sounds positive.   Data Reviewed: I have independently reviewed following labs and imaging studies   CBC Recent Labs  Lab 01/01/23 1600 01/02/23 0347 01/03/23 0856  WBC 9.1 7.6 8.8  HGB 13.7 12.1 13.6  HCT 42.4 36.9 41.7  PLT 242 227 280  MCV 93.6 91.8 92.5  MCH 30.2 30.1 30.2  MCHC 32.3 32.8 32.6  RDW 13.2 13.2 13.2  LYMPHSABS 1.2  --   --   MONOABS 0.6  --   --   EOSABS 0.0  --   --   BASOSABS 0.0  --   --     Recent Labs  Lab 01/01/23 1600 01/02/23 0347  NA 137 136  K 3.8 3.8  CL 107 105  CO2 23 25  GLUCOSE 112* 103*  BUN 21 17  CREATININE 0.75 0.76  CALCIUM 9.1 8.3*  AST 28  --   ALT 25  --   ALKPHOS 141*  --   BILITOT 0.8  --   ALBUMIN 4.2  --   INR 1.0  --     ------------------------------------------------------------------------------------------------------------------ No results for input(s): "CHOL", "HDL", "LDLCALC", "TRIG", "CHOLHDL", "LDLDIRECT" in the last 72 hours.  Lab Results  Component Value Date   HGBA1C 6.1 08/19/2021   ------------------------------------------------------------------------------------------------------------------ No results for input(s): "TSH", "T4TOTAL", "T3FREE", "THYROIDAB" in the last 72 hours.  Invalid input(s): "FREET3"  Cardiac Enzymes No results for input(s): "CKMB", "TROPONINI", "MYOGLOBIN" in the last 168 hours.  Invalid input(s): "CK" ------------------------------------------------------------------------------------------------------------------ No results found for: "BNP"  CBG: No results for input(s): "GLUCAP" in the last 168 hours.  No results found for this or any previous visit (from the past 240 hours).   Radiology  Studies: No results found.    Pamella Pert, MD, PhD [facility]  Between 7 am - 7 pm I am available, please contact me via Amion (for emergencies) or Securechat (non urgent messages)  Between 7 pm - 7 am I am not available, please contact night coverage MD/APP via Amion

## 2023-01-04 DIAGNOSIS — Z86006 Personal history of melanoma in-situ: Secondary | ICD-10-CM | POA: Diagnosis not present

## 2023-01-04 DIAGNOSIS — Z8719 Personal history of other diseases of the digestive system: Secondary | ICD-10-CM

## 2023-01-04 DIAGNOSIS — K625 Hemorrhage of anus and rectum: Secondary | ICD-10-CM | POA: Diagnosis not present

## 2023-01-04 DIAGNOSIS — I10 Essential (primary) hypertension: Secondary | ICD-10-CM | POA: Diagnosis present

## 2023-01-04 DIAGNOSIS — K640 First degree hemorrhoids: Secondary | ICD-10-CM | POA: Diagnosis not present

## 2023-01-04 DIAGNOSIS — Z823 Family history of stroke: Secondary | ICD-10-CM | POA: Diagnosis not present

## 2023-01-04 DIAGNOSIS — Z8673 Personal history of transient ischemic attack (TIA), and cerebral infarction without residual deficits: Secondary | ICD-10-CM

## 2023-01-04 DIAGNOSIS — Z6830 Body mass index (BMI) 30.0-30.9, adult: Secondary | ICD-10-CM | POA: Diagnosis not present

## 2023-01-04 DIAGNOSIS — Z818 Family history of other mental and behavioral disorders: Secondary | ICD-10-CM | POA: Diagnosis not present

## 2023-01-04 DIAGNOSIS — I69322 Dysarthria following cerebral infarction: Secondary | ICD-10-CM | POA: Diagnosis not present

## 2023-01-04 DIAGNOSIS — Z7983 Long term (current) use of bisphosphonates: Secondary | ICD-10-CM | POA: Diagnosis not present

## 2023-01-04 DIAGNOSIS — R21 Rash and other nonspecific skin eruption: Secondary | ICD-10-CM | POA: Diagnosis not present

## 2023-01-04 DIAGNOSIS — E669 Obesity, unspecified: Secondary | ICD-10-CM | POA: Diagnosis present

## 2023-01-04 DIAGNOSIS — K5730 Diverticulosis of large intestine without perforation or abscess without bleeding: Secondary | ICD-10-CM | POA: Diagnosis not present

## 2023-01-04 DIAGNOSIS — Z83438 Family history of other disorder of lipoprotein metabolism and other lipidemia: Secondary | ICD-10-CM | POA: Diagnosis not present

## 2023-01-04 DIAGNOSIS — Z860101 Personal history of adenomatous and serrated colon polyps: Secondary | ICD-10-CM | POA: Diagnosis not present

## 2023-01-04 DIAGNOSIS — Z800 Family history of malignant neoplasm of digestive organs: Secondary | ICD-10-CM | POA: Diagnosis not present

## 2023-01-04 DIAGNOSIS — F419 Anxiety disorder, unspecified: Secondary | ICD-10-CM | POA: Diagnosis present

## 2023-01-04 DIAGNOSIS — D128 Benign neoplasm of rectum: Secondary | ICD-10-CM | POA: Diagnosis not present

## 2023-01-04 DIAGNOSIS — K621 Rectal polyp: Secondary | ICD-10-CM | POA: Diagnosis present

## 2023-01-04 DIAGNOSIS — Z8249 Family history of ischemic heart disease and other diseases of the circulatory system: Secondary | ICD-10-CM | POA: Diagnosis not present

## 2023-01-04 DIAGNOSIS — R195 Other fecal abnormalities: Secondary | ICD-10-CM

## 2023-01-04 DIAGNOSIS — Z85828 Personal history of other malignant neoplasm of skin: Secondary | ICD-10-CM | POA: Diagnosis not present

## 2023-01-04 DIAGNOSIS — E785 Hyperlipidemia, unspecified: Secondary | ICD-10-CM | POA: Diagnosis present

## 2023-01-04 DIAGNOSIS — Z833 Family history of diabetes mellitus: Secondary | ICD-10-CM | POA: Diagnosis not present

## 2023-01-04 DIAGNOSIS — Z7902 Long term (current) use of antithrombotics/antiplatelets: Secondary | ICD-10-CM | POA: Diagnosis not present

## 2023-01-04 DIAGNOSIS — K922 Gastrointestinal hemorrhage, unspecified: Secondary | ICD-10-CM | POA: Diagnosis present

## 2023-01-04 MED ORDER — PEG-KCL-NACL-NASULF-NA ASC-C 100 G PO SOLR
0.5000 | Freq: Once | ORAL | Status: AC
  Administered 2023-01-04: 100 g via ORAL
  Filled 2023-01-04: qty 1

## 2023-01-04 MED ORDER — PEG-KCL-NACL-NASULF-NA ASC-C 100 G PO SOLR
0.5000 | Freq: Once | ORAL | Status: AC
  Administered 2023-01-04: 100 g via ORAL

## 2023-01-04 MED ORDER — SODIUM CHLORIDE 0.9 % IV SOLN: INTRAVENOUS | Status: DC

## 2023-01-04 MED ORDER — PEG-KCL-NACL-NASULF-NA ASC-C 100 G PO SOLR: 1.0000 | Freq: Once | ORAL | Status: DC

## 2023-01-04 NOTE — H&P (View-Only) (Signed)
Progress Note  Primary GI: Dr. Rhea Belton (Dr. Juanda Chance) DOA: 01/01/2023         Hospital Day: 4   Subjective  Chief Complaint:  Painless rectal bleeding in setting of plavix   No family was present at the time of my evaluation. Patient was given Dulcolax last night with multiple bowel movements yesterday had least 4-5 brown soft stools denies any hematochezia.  She has had nausea but no vomiting. Denies chest pain, shortness of breath.    Objective   Vital signs in last 24 hours: Temp:  [97.5 F (36.4 C)-98 F (36.7 C)] 97.8 F (36.6 C) (12/23 0720) Pulse Rate:  [98-126] 104 (12/23 0720) Resp:  [16-18] 18 (12/23 0720) BP: (127-133)/(72-107) 131/78 (12/23 0720) SpO2:  [95 %-98 %] 97 % (12/23 0720) Last BM Date : 01/03/23 Last BM recorded by nurses in past 5 days Stool Type: Type 7 (Liquid consistency with no solid pieces) (01/03/2023  8:15 PM)  General:   Pleasant, well developed female in no acute distress Heart:  regular rate and rhythm, no MRG Pulm: Clear anteriorly; no wheezing Abdomen:  Soft, Obese AB, Active bowel sounds. No tenderness  Extremities:  Without edema. Msk:  Symmetrical without gross deformities. Peripheral pulses intact.  Neurologic:  Alert and  oriented x4;  No focal deficits.  Skin:   Dry and intact without significant lesions or rashes. Psychiatric:  Cooperative. Normal mood and affect.  Intake/Output from previous day: 12/22 0701 - 12/23 0700 In: 240 [P.O.:240] Out: -  Intake/Output this shift: No intake/output data recorded.  Studies/Results: No results found.  Lab Results: Recent Labs    01/01/23 1600 01/02/23 0347 01/03/23 0856  WBC 9.1 7.6 8.8  HGB 13.7 12.1 13.6  HCT 42.4 36.9 41.7  PLT 242 227 280   BMET Recent Labs    01/01/23 1600 01/02/23 0347  NA 137 136  K 3.8 3.8  CL 107 105  CO2 23 25  GLUCOSE 112* 103*  BUN 21 17  CREATININE 0.75 0.76  CALCIUM 9.1 8.3*   LFT Recent Labs    01/01/23 1600  PROT 7.4   ALBUMIN 4.2  AST 28  ALT 25  ALKPHOS 141*  BILITOT 0.8   PT/INR Recent Labs    01/01/23 1600  LABPROT 12.9  INR 1.0     Scheduled Meds:  atorvastatin  40 mg Oral Daily   losartan  50 mg Oral Daily   polyethylene glycol  17 g Oral BID   sertraline  50 mg Oral Daily   Continuous Infusions:  sodium chloride        Patient profile:   82 year old female with history of CVA on Plavix, 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   Impression/Plan:   BRBPR  01/03/2023  HGB 13.6 MCV 92.5 Platelets 280 On Plavix, last dose 12/19 in the morning FOBT positive 09/2004 colonoscopy Dr. Juanda Chance due to family history of colon cancer/polyps showed diverticulosis moderate to severe and 30 mm pedunculated TA polyp piecemeal removed switch to have recall 3 years the patient underwent back CTA no acute process positive colonic diverticulosis, no masses or evidence of lymphadenopathy Possible this is hemorrhoidal bleeding/fissure however with patient's previous colonoscopy with rather large TA polyp without a follow-up since 2006 suggest colonoscopy Patient is completing Plavix washout, tomorrow will be day 5. She has had multiple bowel  movements with Dulcolax will complete movi prep this evening. Clear liquid diet today, n.p.o. at midnight Continue to monitor CBC transfuse greater than 7   CVA with some dysarthria Continue to hold Plavix Instructions will be given after colonoscopy and when to restart    Principal Problem:   BRBPR (bright red blood per rectum) Active Problems:   HLD (hyperlipidemia)   Benign essential HTN   Anxiety   History of CVA (cerebrovascular accident)   History of colonic polyps    LOS: 0 days   Doree Albee  01/04/2023, 9:18 AM

## 2023-01-04 NOTE — Anesthesia Preprocedure Evaluation (Signed)
Anesthesia Evaluation  Patient identified by MRN, date of birth, ID band Patient awake    Reviewed: Allergy & Precautions, NPO status , Patient's Chart, lab work & pertinent test results  Airway Mallampati: II       Dental no notable dental hx. (+) Teeth Intact, Caps, Dental Advisory Given   Pulmonary    Pulmonary exam normal breath sounds clear to auscultation       Cardiovascular hypertension, Pt. on medications Normal cardiovascular exam Rhythm:Regular Rate:Normal  '18 ECHO: EF 55-60%, normal LVF, Grade 1 DD, no significant valvular abnormalities   Neuro/Psych   Anxiety     Right side weakness CVA, Residual Symptoms    GI/Hepatic Neg liver ROS,,,Hematochezia   Endo/Other  BMI 30.9  Renal/GU negative Renal ROS     Musculoskeletal  (+) Arthritis , Osteoarthritis,  Lumbar fusion 2018   Abdominal   Peds  Hematology  (+) Blood dyscrasia, anemia Plavix therapy-last dose 12/19   Anesthesia Other Findings   Reproductive/Obstetrics                              Anesthesia Physical Anesthesia Plan  ASA: 3  Anesthesia Plan: MAC   Post-op Pain Management: Minimal or no pain anticipated   Induction: Intravenous  PONV Risk Score and Plan: 2 and Treatment may vary due to age or medical condition and Propofol infusion  Airway Management Planned: Natural Airway, Simple Face Mask and Nasal Cannula  Additional Equipment: None  Intra-op Plan:   Post-operative Plan:   Informed Consent: I have reviewed the patients History and Physical, chart, labs and discussed the procedure including the risks, benefits and alternatives for the proposed anesthesia with the patient or authorized representative who has indicated his/her understanding and acceptance.     Dental advisory given  Plan Discussed with: Anesthesiologist and CRNA  Anesthesia Plan Comments:          Anesthesia Quick  Evaluation

## 2023-01-04 NOTE — Progress Notes (Signed)
PROGRESS NOTE    [name]  ZOX:[id number] DOB: [birth year] DOA: 01/01/2023 PCP: Jarold Motto, PA   Brief Narrative: 82 year old female with prior CVA on Plavix, HTN, HLD, anxiety who comes into the hospital with rectal bleeding. This is been going on for the past month, however in the last day was worse than normal. She denies any abdominal pain, nausea, vomiting   Assessment & Plan:   Principal Problem:   BRBPR (bright red blood per rectum) Active Problems:   HLD (hyperlipidemia)   Benign essential HTN   Anxiety   History of CVA (cerebrovascular accident)   History of colonic polyps   Lower GI bleeding-appreciate GI, plan for colonoscopy 01/05/2023 noted.  N.p.o. after midnight.   Hemoglobin stable -GI recommends colonoscopy after Plavix wash out, given daily bleeding for the past month, will monitor here until colonoscopy is done, on Tuesday   Active problems History of CVA - Holding Plavix.  Continue atorvastatin.   Hypertension -blood pressure slightly high at times, resume losartan today at half of home dose.  Continue to hold HCTZ   Hyperlipidemia - Continue atorvastatin.   Anxiety - Continue Zoloft.   Estimated body mass index is 30.86 kg/m as calculated from the following:   Height as of this encounter: 5' (1.524 m).   Weight as of this encounter: 71.7 kg.  DVT prophylaxis: scd Code Status: full Family Communication: none Disposition Plan:  Status is: Inpatient Remains inpatient appropriate because:    Consultants: gi  Procedures: none Antimicrobials:none  Subjective:  Had multiple loose BMs after laxatives were given.  No blood noted. Objective: Vitals:   01/03/23 2033 01/03/23 2100 01/04/23 0602 01/04/23 0720  BP: (!) 133/107 133/72 127/72 131/78  Pulse: (!) 126 (!) 105 98 (!) 104  Resp: 18 18 18 18   Temp: 97.9 F (36.6 C)  (!) 97.5 F (36.4 C) 97.8 F (36.6 C)  TempSrc: Oral  Oral Oral  SpO2: 95%  97% 97%  Weight:       Height:        Intake/Output Summary (Last 24 hours) at 01/04/2023 1004 Last data filed at 01/03/2023 2015 Gross per 24 hour  Intake 240 ml  Output --  Net 240 ml   Filed Weights   01/01/23 1555  Weight: 71.7 kg    Examination:  General exam: Appears calm and comfortable, hard of hearing Respiratory system: Clear to auscultation. Respiratory effort normal. Cardiovascular system: S1 & S2 heard, RRR. No JVD, murmurs, rubs, gallops or clicks. No pedal edema. Gastrointestinal system: Abdomen is nondistended, soft and nontender. No organomegaly or masses felt. Normal bowel sounds heard. Central nervous system: Alert and oriented. No focal neurological deficits. Extremities: Symmetric 5 x 5 power.   Data Reviewed: I have personally reviewed following labs and imaging studies  CBC: Recent Labs  Lab 01/01/23 1600 01/02/23 0347 01/03/23 0856  WBC 9.1 7.6 8.8  NEUTROABS 7.2  --   --   HGB 13.7 12.1 13.6  HCT 42.4 36.9 41.7  MCV 93.6 91.8 92.5  PLT 242 227 280   Basic Metabolic Panel: Recent Labs  Lab 01/01/23 1600 01/02/23 0347  NA 137 136  K 3.8 3.8  CL 107 105  CO2 23 25  GLUCOSE 112* 103*  BUN 21 17  CREATININE 0.75 0.76  CALCIUM 9.1 8.3*   GFR: Estimated Creatinine Clearance: 49.6 mL/min (by C-G formula based on SCr of 0.76 mg/dL). Liver Function Tests: Recent Labs  Lab 01/01/23 1600  AST 28  ALT 25  ALKPHOS 141*  BILITOT 0.8  PROT 7.4  ALBUMIN 4.2   No results for input(s): "LIPASE", "AMYLASE" in the last 168 hours. No results for input(s): "AMMONIA" in the last 168 hours. Coagulation Profile: Recent Labs  Lab 01/01/23 1600  INR 1.0   Cardiac Enzymes: No results for input(s): "CKTOTAL", "CKMB", "CKMBINDEX", "TROPONINI" in the last 168 hours. BNP (last 3 results) No results for input(s): "PROBNP" in the last 8760 hours. HbA1C: No results for input(s): "HGBA1C" in the last 72 hours. CBG: No results for input(s): "GLUCAP" in the last 168  hours. Lipid Profile: No results for input(s): "CHOL", "HDL", "LDLCALC", "TRIG", "CHOLHDL", "LDLDIRECT" in the last 72 hours. Thyroid Function Tests: No results for input(s): "TSH", "T4TOTAL", "FREET4", "T3FREE", "THYROIDAB" in the last 72 hours. Anemia Panel: No results for input(s): "VITAMINB12", "FOLATE", "FERRITIN", "TIBC", "IRON", "RETICCTPCT" in the last 72 hours. Sepsis Labs: No results for input(s): "PROCALCITON", "LATICACIDVEN" in the last 168 hours.  No results found for this or any previous visit (from the past 240 hours).    Radiology Studies: No results found.   Scheduled Meds:  atorvastatin  40 mg Oral Daily   losartan  50 mg Oral Daily   peg 3350 powder  0.5 kit Oral Once   And   peg 3350 powder  0.5 kit Oral Once   sertraline  50 mg Oral Daily   Continuous Infusions:  sodium chloride       LOS: 0 days    Time spent: 39 min  Alwyn Ren, MD  01/04/2023, 10:04 AM

## 2023-01-04 NOTE — Plan of Care (Signed)

## 2023-01-04 NOTE — Progress Notes (Signed)
Progress Note  Primary GI: Dr. Rhea Belton (Dr. Juanda Chance) DOA: 01/01/2023         Hospital Day: 4   Subjective  Chief Complaint:  Painless rectal bleeding in setting of plavix   No family was present at the time of my evaluation. Patient was given Dulcolax last night with multiple bowel movements yesterday had least 4-5 brown soft stools denies any hematochezia.  She has had nausea but no vomiting. Denies chest pain, shortness of breath.    Objective   Vital signs in last 24 hours: Temp:  [97.5 F (36.4 C)-98 F (36.7 C)] 97.8 F (36.6 C) (12/23 0720) Pulse Rate:  [98-126] 104 (12/23 0720) Resp:  [16-18] 18 (12/23 0720) BP: (127-133)/(72-107) 131/78 (12/23 0720) SpO2:  [95 %-98 %] 97 % (12/23 0720) Last BM Date : 01/03/23 Last BM recorded by nurses in past 5 days Stool Type: Type 7 (Liquid consistency with no solid pieces) (01/03/2023  8:15 PM)  General:   Pleasant, well developed female in no acute distress Heart:  regular rate and rhythm, no MRG Pulm: Clear anteriorly; no wheezing Abdomen:  Soft, Obese AB, Active bowel sounds. No tenderness  Extremities:  Without edema. Msk:  Symmetrical without gross deformities. Peripheral pulses intact.  Neurologic:  Alert and  oriented x4;  No focal deficits.  Skin:   Dry and intact without significant lesions or rashes. Psychiatric:  Cooperative. Normal mood and affect.  Intake/Output from previous day: 12/22 0701 - 12/23 0700 In: 240 [P.O.:240] Out: -  Intake/Output this shift: No intake/output data recorded.  Studies/Results: No results found.  Lab Results: Recent Labs    01/01/23 1600 01/02/23 0347 01/03/23 0856  WBC 9.1 7.6 8.8  HGB 13.7 12.1 13.6  HCT 42.4 36.9 41.7  PLT 242 227 280   BMET Recent Labs    01/01/23 1600 01/02/23 0347  NA 137 136  K 3.8 3.8  CL 107 105  CO2 23 25  GLUCOSE 112* 103*  BUN 21 17  CREATININE 0.75 0.76  CALCIUM 9.1 8.3*   LFT Recent Labs    01/01/23 1600  PROT 7.4   ALBUMIN 4.2  AST 28  ALT 25  ALKPHOS 141*  BILITOT 0.8   PT/INR Recent Labs    01/01/23 1600  LABPROT 12.9  INR 1.0     Scheduled Meds:  atorvastatin  40 mg Oral Daily   losartan  50 mg Oral Daily   polyethylene glycol  17 g Oral BID   sertraline  50 mg Oral Daily   Continuous Infusions:  sodium chloride        Patient profile:   82 year old female with history of CVA on Plavix, 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   Impression/Plan:   BRBPR  01/03/2023  HGB 13.6 MCV 92.5 Platelets 280 On Plavix, last dose 12/19 in the morning FOBT positive 09/2004 colonoscopy Dr. Juanda Chance due to family history of colon cancer/polyps showed diverticulosis moderate to severe and 30 mm pedunculated TA polyp piecemeal removed switch to have recall 3 years the patient underwent back CTA no acute process positive colonic diverticulosis, no masses or evidence of lymphadenopathy Possible this is hemorrhoidal bleeding/fissure however with patient's previous colonoscopy with rather large TA polyp without a follow-up since 2006 suggest colonoscopy Patient is completing Plavix washout, tomorrow will be day 5. She has had multiple bowel  movements with Dulcolax will complete movi prep this evening. Clear liquid diet today, n.p.o. at midnight Continue to monitor CBC transfuse greater than 7   CVA with some dysarthria Continue to hold Plavix Instructions will be given after colonoscopy and when to restart    Principal Problem:   BRBPR (bright red blood per rectum) Active Problems:   HLD (hyperlipidemia)   Benign essential HTN   Anxiety   History of CVA (cerebrovascular accident)   History of colonic polyps    LOS: 0 days   Doree Albee  01/04/2023, 9:18 AM

## 2023-01-04 NOTE — Progress Notes (Signed)
   01/04/23 0943  Mobility  Activity Ambulated independently in hallway  Level of Assistance Modified independent, requires aide device or extra time  Assistive Device Front wheel walker  Distance Ambulated (ft) 50 ft  Activity Response Tolerated well  Mobility Referral Yes  Mobility visit 1 Mobility  Mobility Specialist Start Time (ACUTE ONLY) [id number]  Mobility Specialist Stop Time (ACUTE ONLY) 0943  Mobility Specialist Time Calculation (min) (ACUTE ONLY) 7 min   Mobility Specialist: Progress Note  Pt agreeable to mobility session - received standing in room. Required ModI using RW. C/o R ear feels clogged d/t "earwax." Pt stated she was been ambulating to and from BR on her own. Returned to standing in room with all needs met - call bell within reach.   Barnie Mort, BS Mobility Specialist Please contact via SecureChat or Rehab office at [phone].

## 2023-01-04 NOTE — TOC CM/SW Note (Signed)
Transition of Care [facility]) - Inpatient Brief Assessment   Patient Details  Name: [name] MRN: [id number] Date of Birth: 06-08-1940  Transition of Care [facility]) CM/SW Contact:    Harriet Masson, RN Phone Number: 01/04/2023, 9:58 AM   Clinical Narrative:  Patient admitted for rectal bleed. Plan for colonoscopy today.  No TOC needs at this time.   Transition of Care Asessment: Insurance and Status: Insurance coverage has been reviewed Patient has primary care physician: Yes Home environment has been reviewed: safe to discharge Prior level of function:: independent Prior/Current Home Services: No current home services Social Drivers of Health Review: SDOH reviewed no interventions necessary Readmission risk has been reviewed: Yes Transition of care needs: no transition of care needs at this time

## 2023-01-05 ENCOUNTER — Inpatient Hospital Stay (HOSPITAL_COMMUNITY): Payer: Medicare Other | Admitting: Certified Registered Nurse Anesthetist

## 2023-01-05 ENCOUNTER — Inpatient Hospital Stay (HOSPITAL_COMMUNITY): Payer: Self-pay | Admitting: Certified Registered Nurse Anesthetist

## 2023-01-05 ENCOUNTER — Encounter (HOSPITAL_COMMUNITY)
Admission: EM | Disposition: A | Discharge status: Home / Self Care | Payer: Self-pay | Source: Home / Self Care | Attending: Internal Medicine

## 2023-01-05 ENCOUNTER — Encounter (HOSPITAL_COMMUNITY): Payer: Self-pay | Admitting: Internal Medicine

## 2023-01-05 DIAGNOSIS — I10 Essential (primary) hypertension: Secondary | ICD-10-CM | POA: Diagnosis not present

## 2023-01-05 DIAGNOSIS — D128 Benign neoplasm of rectum: Secondary | ICD-10-CM

## 2023-01-05 DIAGNOSIS — K625 Hemorrhage of anus and rectum: Secondary | ICD-10-CM | POA: Diagnosis not present

## 2023-01-05 DIAGNOSIS — K5730 Diverticulosis of large intestine without perforation or abscess without bleeding: Secondary | ICD-10-CM | POA: Diagnosis not present

## 2023-01-05 DIAGNOSIS — R21 Rash and other nonspecific skin eruption: Secondary | ICD-10-CM | POA: Diagnosis not present

## 2023-01-05 DIAGNOSIS — K6289 Other specified diseases of anus and rectum: Secondary | ICD-10-CM

## 2023-01-05 DIAGNOSIS — K640 First degree hemorrhoids: Secondary | ICD-10-CM | POA: Diagnosis not present

## 2023-01-05 HISTORY — PX: SUBMUCOSAL LIFTING INJECTION: SHX6855

## 2023-01-05 HISTORY — PX: COLONOSCOPY WITH PROPOFOL: SHX5780

## 2023-01-05 HISTORY — PX: BIOPSY: SHX5522

## 2023-01-05 HISTORY — PX: HEMOSTASIS CONTROL: SHX6838

## 2023-01-05 LAB — COMPREHENSIVE METABOLIC PANEL
ALT: 36 U/L (ref 0–44)
AST: 42 U/L — ABNORMAL HIGH (ref 15–41)
Albumin: 3.9 g/dL (ref 3.5–5.0)
Alkaline Phosphatase: 122 U/L (ref 38–126)
Anion gap: 10 (ref 5–15)
BUN: 28 mg/dL — ABNORMAL HIGH (ref 8–23)
CO2: 20 mmol/L — ABNORMAL LOW (ref 22–32)
Calcium: 8.6 mg/dL — ABNORMAL LOW (ref 8.9–10.3)
Chloride: 112 mmol/L — ABNORMAL HIGH (ref 98–111)
Creatinine, Ser: 1.1 mg/dL — ABNORMAL HIGH (ref 0.44–1.00)
GFR, Estimated: 51 mL/min — ABNORMAL LOW (ref >60)
Glucose, Bld: 100 mg/dL — ABNORMAL HIGH (ref 70–99)
Potassium: 3.8 mmol/L (ref 3.5–5.1)
Sodium: 142 mmol/L (ref 135–145)
Total Bilirubin: 0.9 mg/dL (ref <1.2)
Total Protein: 6.9 g/dL (ref 6.5–8.1)

## 2023-01-05 LAB — CBC
HCT: 38.3 % (ref 36.0–46.0)
Hemoglobin: 12.7 g/dL (ref 12.0–15.0)
MCH: 30.8 pg (ref 26.0–34.0)
MCHC: 33.2 g/dL (ref 30.0–36.0)
MCV: 93 fL (ref 80.0–100.0)
Platelets: 246 10*3/uL (ref 150–400)
RBC: 4.12 MIL/uL (ref 3.87–5.11)
RDW: 13.2 % (ref 11.5–15.5)
WBC: 11 10*3/uL — ABNORMAL HIGH (ref 4.0–10.5)
nRBC: 0 % (ref 0.0–0.2)

## 2023-01-05 SURGERY — COLONOSCOPY WITH PROPOFOL
Anesthesia: Monitor Anesthesia Care

## 2023-01-05 MED ORDER — PHENYLEPHRINE 80 MCG/ML (10ML) SYRINGE FOR IV PUSH (FOR BLOOD PRESSURE SUPPORT)
PREFILLED_SYRINGE | INTRAVENOUS | Status: DC | PRN
  Administered 2023-01-05: 80 ug via INTRAVENOUS

## 2023-01-05 MED ORDER — PROPOFOL 10 MG/ML IV BOLUS
INTRAVENOUS | Status: DC | PRN
  Administered 2023-01-05 (×2): 20 mg via INTRAVENOUS
  Administered 2023-01-05: 40 mg via INTRAVENOUS
  Administered 2023-01-05: 20 mg via INTRAVENOUS

## 2023-01-05 MED ORDER — LOSARTAN POTASSIUM 50 MG PO TABS: 50.0000 mg | ORAL_TABLET | Freq: Every day | ORAL | 0 refills | Status: DC

## 2023-01-05 MED ORDER — SPOT INK MARKER SYRINGE KIT
PACK | SUBMUCOSAL | Status: AC
  Filled 2023-01-05: qty 5

## 2023-01-05 MED ORDER — LIDOCAINE 2% (20 MG/ML) 5 ML SYRINGE
INTRAMUSCULAR | Status: DC | PRN
  Administered 2023-01-05: 100 mg via INTRAVENOUS

## 2023-01-05 MED ORDER — PROPOFOL 500 MG/50ML IV EMUL
INTRAVENOUS | Status: DC | PRN
  Administered 2023-01-05: 50 ug/kg/min via INTRAVENOUS

## 2023-01-05 MED ORDER — SODIUM CHLORIDE (PF) 0.9 % IJ SOLN
PREFILLED_SYRINGE | INTRAMUSCULAR | Status: DC | PRN
  Administered 2023-01-05: 8 mL

## 2023-01-05 SURGICAL SUPPLY — 20 items
ELECT REM PT RETURN 9FT ADLT (ELECTROSURGICAL)
ELECTRODE REM PT RTRN 9FT ADLT (ELECTROSURGICAL) IMPLANT
FCP BXJMBJMB 240X2.8X (CUTTING FORCEPS)
FLOOR PAD 36X40 (MISCELLANEOUS) ×2
FORCEPS BIOP RAD 4 LRG CAP 4 (CUTTING FORCEPS) IMPLANT
FORCEPS BXJMBJMB 240X2.8X (CUTTING FORCEPS) IMPLANT
INJECTOR/SNARE I SNARE (MISCELLANEOUS) IMPLANT
LUBRICANT JELLY 4.5OZ STERILE (MISCELLANEOUS) IMPLANT
MANIFOLD NEPTUNE II (INSTRUMENTS) IMPLANT
NDL SCLEROTHERAPY 25GX240 (NEEDLE) IMPLANT
NEEDLE SCLEROTHERAPY 25GX240 (NEEDLE) IMPLANT
PAD FLOOR 36X40 (MISCELLANEOUS) ×3 IMPLANT
PROBE APC STR FIRE (PROBE) IMPLANT
PROBE INJECTION GOLD 7FR (MISCELLANEOUS) IMPLANT
SNARE ROTATE MED OVAL 20MM (MISCELLANEOUS) IMPLANT
SYR 50ML LL SCALE MARK (SYRINGE) IMPLANT
TRAP SPECIMEN MUCOUS 40CC (MISCELLANEOUS) IMPLANT
TUBING ENDO SMARTCAP PENTAX (MISCELLANEOUS) IMPLANT
TUBING IRRIGATION ENDOGATOR (MISCELLANEOUS) ×3 IMPLANT
WATER STERILE IRR 1000ML POUR (IV SOLUTION) IMPLANT

## 2023-01-05 NOTE — Plan of Care (Signed)

## 2023-01-05 NOTE — Anesthesia Postprocedure Evaluation (Signed)
Anesthesia Post Note  Patient: [name]  Procedure(s) Performed: COLONOSCOPY WITH PROPOFOL HEMOSTASIS CONTROL SUBMUCOSAL LIFTING INJECTION BIOPSY     Patient location during evaluation: Endoscopy Anesthesia Type: MAC Level of consciousness: awake and alert, patient cooperative and oriented Pain management: pain level controlled Vital Signs Assessment: post-procedure vital signs reviewed and stable Respiratory status: spontaneous breathing, nonlabored ventilation and respiratory function stable Cardiovascular status: stable and blood pressure returned to baseline Postop Assessment: no apparent nausea or vomiting Anesthetic complications: no   No notable events documented.  Last Vitals:  Vitals:   01/05/23 0839 01/05/23 0903  BP: (!) 134/92 138/64  Pulse: 99 97  Resp: 17 18  Temp:  (!) 36.3 C  SpO2: 96% 98%    Last Pain:  Vitals:   01/05/23 0903  TempSrc: Oral  PainSc:                  Shane Melby,E. Osmara Drummonds

## 2023-01-05 NOTE — Interval H&P Note (Signed)
History and Physical Interval Note:  01/05/2023 7:19 AM  [name]  has presented today for surgery, with the diagnosis of hematochezia.  The various methods of treatment have been discussed with the patient and family. After consideration of risks, benefits and other options for treatment, the patient has consented to  Procedure(s): COLONOSCOPY WITH PROPOFOL (N/A) as a surgical intervention.  The patient's history has been reviewed, patient examined, no change in status, stable for surgery.  I have reviewed the patient's chart and labs.  Questions were answered to the patient's satisfaction.     Jenel Lucks

## 2023-01-05 NOTE — Progress Notes (Signed)
Patient discharged via NT in wheelchair with son providing ride. IV removed and all papework explained. All belongings with patient.

## 2023-01-05 NOTE — Discharge Summary (Signed)
Physician Discharge Summary  [name] WGN:[id number] DOB: 1940-03-22 DOA: 01/01/2023  PCP: Jarold Motto, PA  Admit date: 01/01/2023 Discharge date: 01/05/2023  Admitted From: Home Disposition: Home  Recommendations for Outpatient Follow-up:  Follow up with PCP in 1-2 weeks Follow-up with GI, general surgery, oncology as discussed CT chest and CEA for staging purposes recommended by GI  Home Health: None Equipment/Devices: None  Discharge Condition: Stable CODE STATUS: Full Diet recommendation: Low-salt low-fat diet  Brief/Interim Summary: 82 year old female with prior CVA on Plavix, HTN, HLD, anxiety who comes into the hospital with rectal bleeding.   Patient admitted as above due to ongoing rectal bleeding, colonoscopy notes colonic mass, GI recommending CT chest and CEA in the next few weeks prior to follow-up with general surgery and oncology for further biopsy results staging removal of the mass.  She is otherwise stable and agreeable for discharge home, tolerating p.o. well, family at bedside agreeable for return home for the holidays.   Discharge Diagnoses:  Principal Problem:   BRBPR (bright red blood per rectum) Active Problems:   HLD (hyperlipidemia)   Benign essential HTN   Anxiety   History of CVA (cerebrovascular accident)   History of colonic polyps   Rectal mass    Discharge Instructions   Allergies as of 01/05/2023       Reactions   Lisinopril Other (See Comments)   Dizziness and felt odd--cough        Medication List     TAKE these medications    alendronate 70 MG tablet Commonly known as: FOSAMAX TAKE 1 TABLET(70 MG) BY MOUTH EVERY 7 DAYS WITH A FULL GLASS OF WATER AND ON AN EMPTY STOMACH   atorvastatin 40 MG tablet Commonly known as: LIPITOR TAKE 1 TABLET(40 MG) BY MOUTH DAILY   clopidogrel 75 MG tablet Commonly known as: PLAVIX TAKE 1 TABLET(75 MG) BY MOUTH DAILY   gabapentin 100 MG capsule Commonly known as:  NEURONTIN TAKE 1 TO 3 CAPSULES BY MOUTH THREE TIMES DAILY AS NEEDED FOR NERVE PAIN OR TINGLING   hydrochlorothiazide 12.5 MG tablet Commonly known as: HYDRODIURIL TAKE 1 TABLET(12.5 MG) BY MOUTH DAILY   losartan 50 MG tablet Commonly known as: COZAAR Take 1 tablet (50 mg total) by mouth daily. Start taking on: January 06, 2023 What changed:  medication strength See the new instructions.   sertraline 50 MG tablet Commonly known as: ZOLOFT TAKE 1 TABLET(50 MG) BY MOUTH DAILY        Allergies  Allergen Reactions   Lisinopril Other (See Comments)    Dizziness and felt odd--cough     Consultations: GI  Procedures/Studies: CT ABDOMEN PELVIS W CONTRAST Result Date: 01/01/2023 CLINICAL DATA:  Lower GI bleeding.  Rectal bleeding. EXAM: CT ABDOMEN AND PELVIS WITH CONTRAST TECHNIQUE: Multidetector CT imaging of the abdomen and pelvis was performed using the standard protocol following bolus administration of intravenous contrast. RADIATION DOSE REDUCTION: This exam was performed according to the departmental dose-optimization program which includes automated exposure control, adjustment of the mA and/or kV according to patient size and/or use of iterative reconstruction technique. CONTRAST:  75mL OMNIPAQUE IOHEXOL 350 MG/ML SOLN COMPARISON:  CT abdomen 11/10/2016. CT abdomen and pelvis 11/20/2004 FINDINGS: Lower chest: Mild atelectasis in the lung bases. Hepatobiliary: No focal liver abnormality is seen. Status post cholecystectomy. Bile ducts are normal for postoperative physiology. Pancreas: Unremarkable. No pancreatic ductal dilatation or surrounding inflammatory changes. Spleen: Normal in size without focal abnormality. Adrenals/Urinary Tract: Adrenal glands are unremarkable. Bilateral parenchymal  and parapelvic cysts measuring up to 1.5 cm diameter. No imaging follow-up is indicated. Kidneys are otherwise normal, without renal calculi, focal solid lesion, or hydronephrosis. Bladder is  unremarkable. Stomach/Bowel: Stomach, small bowel, and colon are not abnormally distended. No wall thickening or inflammatory changes. Colonic diverticulosis without evidence of acute diverticulitis. Appendix is not identified. Vascular/Lymphatic: Aortic atherosclerosis. No enlarged abdominal or pelvic lymph nodes. Reproductive: Status post hysterectomy. No adnexal masses. Other: No abdominal wall hernia or abnormality. No abdominopelvic ascites. Musculoskeletal: Postoperative changes with posterior fixation and intervertebral fusion from L2 through L5. No acute bony abnormalities. IMPRESSION: 1. No acute process demonstrated in the abdomen or pelvis. No evidence of bowel obstruction or inflammation. Colonic diverticulosis without evidence of acute diverticulitis. Aortic atherosclerosis. Electronically Signed   By: Burman Nieves M.D.   On: 01/01/2023 20:21     Subjective: No acute issues or events overnight, tolerated procedure well   Discharge Exam: Vitals:   01/05/23 0839 01/05/23 0903  BP: (!) 134/92 138/64  Pulse: 99 97  Resp: 17 18  Temp:  (!) 97.3 F (36.3 C)  SpO2: 96% 98%   Vitals:   01/05/23 0819 01/05/23 0830 01/05/23 0839 01/05/23 0903  BP: 122/64 (!) 109/97 (!) 134/92 138/64  Pulse: (!) 101 99 99 97  Resp: 16 14 17 18   Temp:    (!) 97.3 F (36.3 C)  TempSrc:    Oral  SpO2: 99% 96% 96% 98%  Weight:      Height:        General: Pt is alert, awake, not in acute distress Cardiovascular: RRR, S1/S2 +, no rubs, no gallops Respiratory: CTA bilaterally, no wheezing, no rhonchi Abdominal: Soft, NT, ND, bowel sounds + Extremities: no edema, no cyanosis    The results of significant diagnostics from this hospitalization (including imaging, microbiology, ancillary and laboratory) are listed below for reference.     Microbiology: No results found for this or any previous visit (from the past 240 hours).   Labs: BNP (last 3 results) No results for input(s): "BNP" in the  last 8760 hours. Basic Metabolic Panel: Recent Labs  Lab 01/01/23 1600 01/02/23 0347 01/05/23 0405  NA 137 136 142  K 3.8 3.8 3.8  CL 107 105 112*  CO2 23 25 20*  GLUCOSE 112* 103* 100*  BUN 21 17 28*  CREATININE 0.75 0.76 1.10*  CALCIUM 9.1 8.3* 8.6*   Liver Function Tests: Recent Labs  Lab 01/01/23 1600 01/05/23 0405  AST 28 42*  ALT 25 36  ALKPHOS 141* 122  BILITOT 0.8 0.9  PROT 7.4 6.9  ALBUMIN 4.2 3.9   No results for input(s): "LIPASE", "AMYLASE" in the last 168 hours. No results for input(s): "AMMONIA" in the last 168 hours. CBC: Recent Labs  Lab 01/01/23 1600 01/02/23 0347 01/03/23 0856 01/05/23 0405  WBC 9.1 7.6 8.8 11.0*  NEUTROABS 7.2  --   --   --   HGB 13.7 12.1 13.6 12.7  HCT 42.4 36.9 41.7 38.3  MCV 93.6 91.8 92.5 93.0  PLT 242 227 280 246   Cardiac Enzymes: No results for input(s): "CKTOTAL", "CKMB", "CKMBINDEX", "TROPONINI" in the last 168 hours. BNP: Invalid input(s): "POCBNP" CBG: No results for input(s): "GLUCAP" in the last 168 hours. D-Dimer No results for input(s): "DDIMER" in the last 72 hours. Hgb A1c No results for input(s): "HGBA1C" in the last 72 hours. Lipid Profile No results for input(s): "CHOL", "HDL", "LDLCALC", "TRIG", "CHOLHDL", "LDLDIRECT" in the last 72 hours. Thyroid function  studies No results for input(s): "TSH", "T4TOTAL", "T3FREE", "THYROIDAB" in the last 72 hours.  Invalid input(s): "FREET3" Anemia work up No results for input(s): "VITAMINB12", "FOLATE", "FERRITIN", "TIBC", "IRON", "RETICCTPCT" in the last 72 hours. Urinalysis    Component Value Date/Time   COLORURINE YELLOW 08/19/2021 1542   APPEARANCEUR Sl Cloudy (A) 08/19/2021 1542   LABSPEC 1.020 08/19/2021 1542   PHURINE 5.5 08/19/2021 1542   GLUCOSEU NEGATIVE 08/19/2021 1542   HGBUR NEGATIVE 08/19/2021 1542   HGBUR negative 08/23/2009 0813   BILIRUBINUR NEGATIVE 08/19/2021 1542   KETONESUR NEGATIVE 08/19/2021 1542   PROTEINUR NEGATIVE 08/14/2021  1826   UROBILINOGEN 0.2 08/19/2021 1542   NITRITE NEGATIVE 08/19/2021 1542   LEUKOCYTESUR TRACE (A) 08/19/2021 1542   Sepsis Labs Recent Labs  Lab 01/01/23 1600 01/02/23 0347 01/03/23 0856 01/05/23 0405  WBC 9.1 7.6 8.8 11.0*   Microbiology No results found for this or any previous visit (from the past 240 hours).   Time coordinating discharge: Over 30 minutes  SIGNED:   Azucena Fallen, DO [facility] 01/05/2023, 5:52 PM Pager   If 7PM-7AM, please contact night-coverage [email or web address]

## 2023-01-05 NOTE — Transfer of Care (Signed)
Immediate Anesthesia Transfer of Care Note  Patient: [name]  Procedure(s) Performed: COLONOSCOPY WITH PROPOFOL HEMOSTASIS CONTROL SUBMUCOSAL LIFTING INJECTION BIOPSY  Patient Location: PACU and Endoscopy Unit  Anesthesia Type:MAC  Level of Consciousness: awake, alert , oriented, and patient cooperative  Airway & Oxygen Therapy: Patient Spontanous Breathing and Patient connected to nasal cannula oxygen  Post-op Assessment: Report given to RN and Post -op Vital signs reviewed and stable  Post vital signs: Reviewed and stable  Last Vitals:  Vitals Value Taken Time  BP    Temp    Pulse 104 01/05/23 0816  Resp 21 01/05/23 0816  SpO2 98 % 01/05/23 0816  Vitals shown include unfiled device data.  Last Pain:  Vitals:   01/05/23 0702  TempSrc: Tympanic  PainSc: 0-No pain         Complications: No notable events documented.

## 2023-01-05 NOTE — Op Note (Signed)
[facility] Patient Name: [name] Procedure Date : 01/05/2023 MRN: [id number] Attending MD: Dub Amis. Tomasa Rand , MD, [id number] Date of Birth: 1941/01/01 CSN: [id number] Age: 82 Admit Type: Inpatient Procedure:                Colonoscopy Indications:              Hematochezia Providers:                Lorin Picket E. Tomasa Rand, MD, Stephens Shire RN, RN, Alan Ripper, Technician Referring MD:              Medicines:                Monitored Anesthesia Care Complications:            No immediate complications. Estimated Blood Loss:     Estimated blood loss was minimal. Procedure:                Pre-Anesthesia Assessment:                           - Prior to the procedure, a History and Physical                            was performed, and patient medications and                            allergies were reviewed. The patient's tolerance of                            previous anesthesia was also reviewed. The risks                            and benefits of the procedure and the sedation                            options and risks were discussed with the patient.                            All questions were answered, and informed consent                            was obtained. Prior Anticoagulants: The patient has                            taken Plavix (clopidogrel), last dose was 5 days                            prior to procedure. ASA Grade Assessment: II - A                            patient with mild systemic disease. After reviewing  the risks and benefits, the patient was deemed in                            satisfactory condition to undergo the procedure.                           After obtaining informed consent, the colonoscope                            was passed under direct vision. Throughout the                            procedure, the patient's blood pressure, pulse, and                             oxygen saturations were monitored continuously. The                            PCF-190TL ([id number]) Olympus colonoscope was                            introduced through the anus and advanced to the the                            cecum, identified by appendiceal orifice and                            ileocecal valve. The colonoscopy was performed                            without difficulty. The patient tolerated the                            procedure well. The quality of the bowel                            preparation was good. The ileocecal valve,                            appendiceal orifice, and rectum were photographed.                            The bowel preparation used was MoviPrep via split                            dose instruction. Scope In: 7:41:17 AM Scope Out: 8:10:49 AM Scope Withdrawal Time: 0 hours 18 minutes 46 seconds  Total Procedure Duration: 0 hours 29 minutes 32 seconds  Findings:      The perianal exam findings include a perianal rash.      The digital rectal exam was normal. Pertinent negatives include normal       sphincter tone and no palpable rectal lesions.      A polypoid non-obstructing mass was found in the mid rectum (about 12  cm. The mass was non-circumferential and estimated about 2.5-3 cm in       length. No bleeding was present, there was contact friability. Upon       initial assessment of the lesion, a stalk was identified and the lesion       was felt to be amenable to endoscopic resection. The stalk was injected       with 8mL of epinephrine (diluted 1:1,000). Further manipulation of the       lesion showed that the back side of the polyp appeared to be adhere to       the rectal wall. Injection with 4 mL Everlift did not produce a lift. As       there was concern for malignancy, no further attempts at resection were       made. Eight biopsies were taken with a cold forceps for histology.       Estimated blood loss was minimal.       Many large-mouthed, medium-mouthed and small-mouthed diverticula were       found in the sigmoid colon, descending colon, transverse colon and       ascending colon. There was no evidence of diverticular bleeding.      The exam was otherwise normal throughout the examined colon.      Non-bleeding internal hemorrhoids were found during retroflexion. The       hemorrhoids were Grade I (internal hemorrhoids that do not prolapse).      No additional abnormalities were found on retroflexion. Impression:               - Perianal rash found on perianal exam, likely                            secondary to bowel prep.                           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                            Biopsied. Treatment not successful.                           - Severe diverticulosis in the sigmoid colon, in                            the descending colon, in the transverse colon and                            in the ascending colon. There was no evidence of                            diverticular bleeding.                           - Non-bleeding internal hemorrhoids. Moderate Sedation:      N/A Recommendation:           - Return patient to hospital ward for possible  discharge same day.                           - Resume regular diet.                           - Resume Plavix (clopidogrel) at prior dose                            tomorrow.                           - Await pathology results.                           - Obtain CT chest for staging.                           - Obtain baseline CEA.                           - Refer to colorectal surgeon Procedure Code(s):        --- Professional ---                           [phone], Colonoscopy, flexible; with biopsy, single                            or multiple                           [id number], Colonoscopy, flexible; with directed                            submucosal injection(s), any substance Diagnosis  Code(s):        --- Professional ---                           R21, Rash and other nonspecific skin eruption                           D49.0, Neoplasm of unspecified behavior of                            digestive system                           K64.0, First degree hemorrhoids                           K92.1, Melena (includes Hematochezia)                           K57.30, Diverticulosis of large intestine without                            perforation or abscess without bleeding CPT copyright 2022 American Medical Association. All rights reserved. The codes documented in this report are preliminary  and upon coder review may  be revised to meet current compliance requirements. Andrew Blasius E. Tomasa Rand, MD 01/05/2023 8:27:45 AM This report has been signed electronically. Number of Addenda: 0

## 2023-01-07 ENCOUNTER — Telehealth: Payer: Self-pay

## 2023-01-07 ENCOUNTER — Encounter (HOSPITAL_COMMUNITY): Payer: Self-pay | Admitting: Gastroenterology

## 2023-01-07 LAB — SURGICAL PATHOLOGY

## 2023-01-07 NOTE — Telephone Encounter (Signed)
Patient called to receive the results of recent colonoscopy and biopsy.

## 2023-01-07 NOTE — Transitions of Care (Post Inpatient/ED Visit) (Signed)
   01/07/2023  Name: [name] MRN: [id number] DOB: 19-Aug-1940  Today's TOC FU Call Status:    Attempted to reach the patient regarding the most recent Inpatient/ED visit.  Follow Up Plan: Additional outreach attempts will be made to reach the patient to complete the Transitions of Care (Post Inpatient/ED visit) call.   Deidre Ala, RN Medical illustrator [facility] [phone]

## 2023-01-07 NOTE — Telephone Encounter (Signed)
Reason for CRM: Patient called to receive the results of recent colonoscopy and biopsy. Please assist.

## 2023-01-08 ENCOUNTER — Telehealth: Payer: Self-pay

## 2023-01-08 NOTE — Transitions of Care (Post Inpatient/ED Visit) (Signed)
01/08/2023  Name: [name] MRN: [id number] DOB: 1940-10-28  Today's TOC FU Call Status: Today's TOC FU Call Status:: Successful TOC FU Call Completed TOC FU Call Complete Date: 01/08/23 Patient's Name and Date of Birth confirmed.  Transition Care Management Follow-up Telephone Call Date of Discharge: 01/05/23 Discharge Facility: [facility]) Type of Discharge: Inpatient Admission Primary Inpatient Discharge Diagnosis:: 'acute GI bleed" How have you been since you were released from the hospital?: Better (Pt pleased to report she is doing well- resting and eating good since coming home-no BM yet- up walking & moving around-wrapping gifts-looking forward to celebrating late holidays with family esp great-grands) Any questions or concerns?: No  Items Reviewed: Did you receive and understand the discharge instructions provided?: Yes Medications obtained,verified, and reconciled?: Yes (Medications Reviewed) Any new allergies since your discharge?: No Dietary orders reviewed?: Yes Type of Diet Ordered:: low salt/heart healthy Do you have support at home?: Yes People in Home: alone, child(ren), adult Name of Support/Comfort Primary Source: family nearby and able to assist as needed  Medications Reviewed Today: Medications Reviewed Today     Reviewed by Charlyn Minerva, RN (Registered Nurse) on 01/08/23 at [phone]  Med List Status: <None>   Medication Order Taking? Sig Documenting Provider Last Dose Status Informant  alendronate (FOSAMAX) 70 MG tablet [id number] Yes TAKE 1 TABLET(70 MG) BY MOUTH EVERY 7 DAYS WITH A FULL GLASS OF WATER AND ON AN EMPTY STOMACH Jarold Motto, PA Taking Active Self, Pharmacy Records  atorvastatin (LIPITOR) 40 MG tablet [id number] Yes TAKE 1 TABLET(40 MG) BY MOUTH DAILY Jarold Motto, [place] 01/08/2023 Morning Active Self, Pharmacy Records  clopidogrel (PLAVIX) 75 MG tablet [id number] Yes TAKE 1 TABLET(75 MG) BY MOUTH DAILY Jarold Motto, [place] 01/08/2023 Morning Active Self, Pharmacy Records  gabapentin (NEURONTIN) 100 MG capsule [id number] Yes TAKE 1 TO 3 CAPSULES BY MOUTH THREE TIMES DAILY AS NEEDED FOR NERVE PAIN OR TINGLING Rodolph Bong, MD 01/08/2023 Morning Active Self, Pharmacy Records  hydrochlorothiazide (HYDRODIURIL) 12.5 MG tablet [id number] Yes TAKE 1 TABLET(12.5 MG) BY MOUTH DAILY Jarold Motto, PA 01/08/2023 Morning Active Self, Pharmacy Records  losartan (COZAAR) 50 MG tablet [id number] Yes Take 1 tablet (50 mg total) by mouth daily. Azucena Fallen, MD 01/08/2023 Morning Active   sertraline (ZOLOFT) 50 MG tablet [id number] Yes TAKE 1 TABLET(50 MG) BY MOUTH DAILY Jarold Motto, [place] 01/08/2023 Morning Active Self, Pharmacy Records            Home Care and Equipment/Supplies: Were Home Health Services Ordered?: NA Any new equipment or medical supplies ordered?: NA  Functional Questionnaire: Do you need assistance with bathing/showering or dressing?: No Do you need assistance with meal preparation?: No Do you need assistance with eating?: No Do you have difficulty maintaining continence: No Do you need assistance with getting out of bed/getting out of a chair/moving?: No Do you have difficulty managing or taking your medications?: No  Follow up appointments reviewed: PCP Follow-up appointment confirmed?: No (Pt declined making appt during call-prefers to call office on her own to arrange appt) MD Provider Line Number:[phone] Given: No Specialist Hospital Follow-up appointment confirmed?: No Reason Specialist Follow-Up Not Confirmed: Patient has Specialist Provider Number and will Call for Appointment (discussed with pt per physcian d/c summary as not listed on pt d/c papers to f/u with GI, gen suregon and oncology-she remembers vaguely this being discussed with her and will do so) Do you need transportation to your follow-up appointment?: No Do you understand care  options if your  condition(s) worsen?: Yes-patient verbalized understanding  SDOH Interventions Today    Flowsheet Row Most Recent Value  SDOH Interventions   Food Insecurity Interventions Intervention Not Indicated  Housing Interventions Intervention Not Indicated  Transportation Interventions Intervention Not Indicated  Utilities Interventions Intervention Not Indicated      Interventions Today    Flowsheet Row Most Recent Value  Chronic Disease   Chronic disease during today's visit Hypertension (HTN)  General Interventions   General Interventions Discussed/Reviewed General Interventions Discussed, Durable Medical Equipment (DME), Doctor Visits  Doctor Visits Discussed/Reviewed Doctor Visits Discussed, PCP, Specialist  Durable Medical Equipment (DME) BP Cuff  [discussed with pt importance of home monitoring of BP-confirmed she has device-instructed to check BP daily-record and take BP log to PCP f/u appt and alert PCP of any abnormal readings]  PCP/Specialist Visits Compliance with follow-up visit  Education Interventions   Education Provided Provided Education  Provided Verbal Education On Nutrition, When to see the doctor, Medication, Other  Nutrition Interventions   Nutrition Discussed/Reviewed Nutrition Discussed, Decreasing salt, Adding fruits and vegetables  Pharmacy Interventions   Pharmacy Dicussed/Reviewed Pharmacy Topics Discussed, Medications and their functions  Safety Interventions   Safety Discussed/Reviewed Safety Discussed        TOC Interventions Today    Flowsheet Row Most Recent Value  TOC Interventions   TOC Interventions Discussed/Reviewed TOC Interventions Discussed         Antionette Fairy, RN,BSN,CCM RN Care Manager Transitions of Care  [place]  Direct Phone: [phone] Toll Free: [phone] Fax: [phone]

## 2023-01-09 NOTE — Progress Notes (Signed)
[name],  The biopsies of the large rectal polyp did not show definitive cancer.  There were findings suspicious for cancer, but the pathologist did not see enough evidence to make a diagnosis of cancer.  It is possible that there is cancer present, that just was not biopsied (some parts of a growth may be cancerous while others have not yet developed into cancer).  I do still recommend you be seen by Dr. Cliffton Asters, the colorectal surgeon, so that he can discuss potential surgical options.  Given the size and location of the lesion, you may be a candidate for a minimally invasive surgery.  Alternatively, we can also consider an attempt at endoscopic resection, but if there is evidence of cancer on the specimen, this would not be ideal treatment.    We have placed a referral to Dr. Lucilla Lame office, you should be hearing from them next week.

## 2023-01-11 ENCOUNTER — Telehealth: Payer: Self-pay

## 2023-01-11 NOTE — Telephone Encounter (Signed)
-----   Message from Jenel Lucks sent at 01/09/2023  4:52 PM EST ----- Regarding: Rectal mass [name],  This is the patient we discussed at the hospital the other day.  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   Endoscopically, I think it would be resectable from a size standpoint, but I would worry there is cancer deeper than what my biopsies showed.  Surgery would be much more definitive.  Can you see her and if she doesn't want to surgery we can try endoscopic resection?  Bonita Quin,  Can you please place a referral to Dr. Cliffton Asters or Dr. Michaell Cowing for consideration of surgical removal of large rectal polyp?

## 2023-01-11 NOTE — Telephone Encounter (Signed)
Referral faxed to [facility] for pt to see Dr. Michaell Cowing or Dr. Cliffton Asters.

## 2023-01-18 ENCOUNTER — Other Ambulatory Visit: Payer: Self-pay

## 2023-01-18 DIAGNOSIS — K6289 Other specified diseases of anus and rectum: Secondary | ICD-10-CM

## 2023-01-19 ENCOUNTER — Encounter: Payer: Self-pay | Admitting: Gastroenterology

## 2023-01-19 ENCOUNTER — Telehealth: Payer: Self-pay

## 2023-01-19 NOTE — Telephone Encounter (Signed)
 Inbound call from patient's son stating patient is confused regarding scheduled appointments and MRI. Son is requesting a call back to gain clarity. Also stated he would like for patient to speak with Dr. Sheldon prior to scheduling MRI. Please advise, thank you.

## 2023-01-19 NOTE — Telephone Encounter (Signed)
 Dr. Tomasa Rand please see the note below regarding the pts son and his concerns. I would think the MRI would need to be done prior to seeing the surgeon. Please advise.

## 2023-01-19 NOTE — Telephone Encounter (Signed)
-----   Message from Jenel Lucks sent at 01/18/2023  4:31 PM EST ----- Regarding: Pelvic MRI Bonita Quin,  Can you please order an MRI of the pelvis for further evaluation of rectal mass (rectal cancer staging protocol)?  Thanks

## 2023-01-19 NOTE — Telephone Encounter (Signed)
 Order entered in epic. Rad scheduling to contact pt to set up appt.

## 2023-01-22 ENCOUNTER — Other Ambulatory Visit (HOSPITAL_COMMUNITY): Payer: Medicare Other

## 2023-01-26 ENCOUNTER — Ambulatory Visit: Payer: Self-pay | Admitting: Physician Assistant

## 2023-01-26 NOTE — Telephone Encounter (Signed)
 Copied from CRM [id number]. Topic: Clinical - Prescription Issue >> Jan 26, 2023 10:28 AM Alfonso ORN wrote: Reason for CRM: Patient need all her medication refill in the future to go to the [facility]  -[address] [address] - [place] phone # [phone] Do not send to [facility], patient not needing a refill now this is for future medication refills

## 2023-01-26 NOTE — Telephone Encounter (Signed)
 Please send all future medications to [facility]. Nothing to [facility].          Copied from CRM [id number]. Topic: Clinical - Prescription Issue >> Jan 26, 2023 10:52 AM Alfonso ORN wrote: Reason for CRM: request to have  [id number] [place], [place] - [address] AT [facility] [address] [facility] [address] [place] [address] Phone: [phone] Fax: [phone] transfer all patient medication refills to [facility] will not be using [facility] -[address] [address] - [place] - phone # [phone]  Patient need all her medication refill in the future to go to the [facility]  -[address] [address] - [place] - phone # [phone] Do not send to [facility], patient not needing a refill now this is for future medication refills  NOTE: Patient request if her medication refills be mailed to her from [facility] if they do mail service if not her daughter would have to pickup refills

## 2023-01-29 ENCOUNTER — Other Ambulatory Visit: Payer: Self-pay | Admitting: Physician Assistant

## 2023-01-29 MED ORDER — ALENDRONATE SODIUM 70 MG PO TABS: ORAL_TABLET | ORAL | 2 refills | Status: DC

## 2023-01-29 MED ORDER — GABAPENTIN 100 MG PO CAPS: 100.0000 mg | ORAL_CAPSULE | Freq: Three times a day (TID) | ORAL | 3 refills | Status: DC | PRN

## 2023-01-29 MED ORDER — SERTRALINE HCL 50 MG PO TABS: ORAL_TABLET | ORAL | 1 refills | Status: DC

## 2023-01-29 MED ORDER — LOSARTAN POTASSIUM 50 MG PO TABS: 50.0000 mg | ORAL_TABLET | Freq: Every day | ORAL | 1 refills | Status: DC

## 2023-01-29 MED ORDER — HYDROCHLOROTHIAZIDE 12.5 MG PO TABS: 12.5000 mg | ORAL_TABLET | Freq: Every day | ORAL | 1 refills | Status: DC

## 2023-01-29 MED ORDER — CLOPIDOGREL BISULFATE 75 MG PO TABS: 75.0000 mg | ORAL_TABLET | Freq: Every day | ORAL | 1 refills | Status: DC

## 2023-01-29 MED ORDER — ATORVASTATIN CALCIUM 40 MG PO TABS: ORAL_TABLET | ORAL | 1 refills | Status: DC

## 2023-01-29 NOTE — Telephone Encounter (Signed)
Spoke to pt told her I sent all Rx's as requested to [facility]. Pt verbalized understanding.

## 2023-01-29 NOTE — Telephone Encounter (Signed)
Copied from CRM [id number]. Topic: Clinical - Prescription Issue >> Jan 29, 2023 10:37 AM Shelbie Proctor wrote: Reason for CRM: Patient [phone] spoke with nurse and all her medications need to be sent to [facility] pharmacy, please do not send to [facility]. [facility] does not have the medications. Please send medications today. LOV   alendronate (FOSAMAX) 70 MG tablet atorvastatin (LIPITOR) 40 MG tablet clopidogrel (PLAVIX) 75 MG tablet  hydrochlorothiazide (HYDRODIURIL) 12.5 MG tablet  losartan (COZAAR) 50 MG tablet  sertraline (ZOLOFT) 50 MG tablet  gabapentin (NEURONTIN) 100 MG capsule    [facility] [id number] - [place], [place] - [address] [place] [address] Phone:[phone]:[phone]

## 2023-02-02 NOTE — Telephone Encounter (Signed)
Mail  Attn: Wyatt Portela   [address] [place], [place] [address]   Call his nurse once mailed   [phone] Judeth Cornfield

## 2023-02-02 NOTE — Telephone Encounter (Signed)
Colored copy of colonoscopy report has been mailed to Dr. Scotty Court as requested below. I left Judeth Cornfield a detailed vm letting her know that report has been mailed out, may take a few weeks to receive in the mail. Thanks

## 2023-02-03 DIAGNOSIS — K6289 Other specified diseases of anus and rectum: Secondary | ICD-10-CM | POA: Diagnosis not present

## 2023-02-03 DIAGNOSIS — Z1330 Encounter for screening examination for mental health and behavioral disorders, unspecified: Secondary | ICD-10-CM | POA: Diagnosis not present

## 2023-02-10 DIAGNOSIS — N281 Cyst of kidney, acquired: Secondary | ICD-10-CM | POA: Diagnosis not present

## 2023-02-10 DIAGNOSIS — Z9049 Acquired absence of other specified parts of digestive tract: Secondary | ICD-10-CM | POA: Diagnosis not present

## 2023-02-10 DIAGNOSIS — J8410 Pulmonary fibrosis, unspecified: Secondary | ICD-10-CM | POA: Diagnosis not present

## 2023-02-10 DIAGNOSIS — K6289 Other specified diseases of anus and rectum: Secondary | ICD-10-CM | POA: Diagnosis not present

## 2023-02-10 DIAGNOSIS — C20 Malignant neoplasm of rectum: Secondary | ICD-10-CM | POA: Diagnosis not present

## 2023-02-10 DIAGNOSIS — J9811 Atelectasis: Secondary | ICD-10-CM | POA: Diagnosis not present

## 2023-02-16 ENCOUNTER — Telehealth: Payer: Self-pay | Admitting: *Deleted

## 2023-02-16 DIAGNOSIS — K6289 Other specified diseases of anus and rectum: Secondary | ICD-10-CM | POA: Diagnosis not present

## 2023-02-16 NOTE — Telephone Encounter (Signed)
 Copied from CRM [id number]. Topic: Clinical - Medication Question >> Feb 16, 2023  2:38 PM Thersia C wrote: Reason for CRM: Patient called in stating she is now currently taking  losartan  (COZAAR ) 50 MG tablet but was taking 100 mg, wanted to know do she need to keep taking the 50 mg or take the 100mg  she is requesting a callback on this matter

## 2023-02-16 NOTE — Telephone Encounter (Signed)
 Spoke to pt told her per [name], It appears that this was changed when patient was hospitalized for a gastrointestinal bleed.   I recommend that she take her blood pressure readings at home daily x 1 week and follow-up with us  next week to see if the current dosage of 50 mg daily is appropriate. Pt verbalized understanding and will call back to schedule.

## 2023-02-16 NOTE — Telephone Encounter (Signed)
[name] please clarify what dose of Losartan 50 mg or 100 mg pt is suppose to be taking?

## 2023-02-23 DIAGNOSIS — D375 Neoplasm of uncertain behavior of rectum: Secondary | ICD-10-CM | POA: Diagnosis not present

## 2023-02-23 DIAGNOSIS — C20 Malignant neoplasm of rectum: Secondary | ICD-10-CM | POA: Diagnosis not present

## 2023-02-24 ENCOUNTER — Telehealth: Payer: Self-pay | Admitting: Family Medicine

## 2023-02-24 NOTE — Telephone Encounter (Signed)
Pt calling for refill and possible change to Gabapentin rx. PCP filled last time as taking 1, 3 times a day.  Pt states Dr. Denyse Amass originally filled at taking 1-3, 3 times a day. Pt averages 7 pills per day, no pain but still numbness in foot.  If agreeable to dosage routine, can we refill at higher dose or with a 90 day supply?  Karin Golden

## 2023-02-24 NOTE — Telephone Encounter (Signed)
Forwarding to Dr. Denyse Amass to review and advise.

## 2023-02-25 MED ORDER — GABAPENTIN 100 MG PO CAPS: 100.0000 mg | ORAL_CAPSULE | Freq: Three times a day (TID) | ORAL | 3 refills | Status: AC | PRN

## 2023-02-25 NOTE — Telephone Encounter (Signed)
Sent in a  larger quantity of gabapentin with instructions to take up to 3 pills 3 times daily.

## 2023-03-02 DIAGNOSIS — C20 Malignant neoplasm of rectum: Secondary | ICD-10-CM | POA: Diagnosis not present

## 2023-03-08 ENCOUNTER — Telehealth: Payer: Self-pay | Admitting: *Deleted

## 2023-03-08 NOTE — Telephone Encounter (Signed)
 Copied from CRM [id number]. Topic: General - Other >> Mar 05, 2023  2:11 PM Truddie Crumble wrote: Reason for CRM: patient called stating she would like to know what mg of losartan she should be taking to take. Patient want to know if it is 100 mg or  the 50 mg according to her blood pressure readings 1/22-140/80 2/2-130/69 2/18-159/92 2/21-138/79 at 9am and 130/70 at 2pm (these were taken at home)

## 2023-03-08 NOTE — Telephone Encounter (Signed)
 [name], it pt suppose to be taking Losartan 50 mg or 100 mg daily? See message.

## 2023-03-08 NOTE — Telephone Encounter (Signed)
 Please ask patient what dosage of losartan she is currently taking? Is she still taking her hydrochloroTHIAZIDE 12.5 mg daily as well?  Patient contacted [place] with the same question at the beginning of the month, as advised in phone note from February 4:  "It appears that this was changed when patient was hospitalized for a gastrointestinal bleed I recommend that she take her blood pressure readings at home daily x 1 week and follow-up with [place] next week to see if the current dosage of 50 mg daily is appropriate."  Her goal BP is 130/90 or lower  If she is taking losartan 50 mg daily and hydrochloroTHIAZIDE 12.5 mg daily currently and these readings are reflective of that, I recommend that she increase losartan to 100 mg daily and follow-up with me in 2-4 weeks, sooner if concerns.  I would really like to see her in office for a follow up appointment to discuss her blood pressure and follow-up on her ongoing gastroenterology issues.  Lelon Mast

## 2023-03-09 NOTE — Telephone Encounter (Signed)
 Spoke to pt asked her what dose of Losartan have you been taking? Pt said she has been taking Losartan 50 mg daily since home from the hospital. Asked pt if taking hydrochlorothiazide 12.5 mg daily? Pt said yes. Told her okay, Lelon Mast said with the blood pressure readings you sent her she would like you to increase your Losartan to 100 mg daily and continue hydrochlorothiazide 12.5 mg daily too. Also she would like to see you in 2-4 weeks for f/u if blood pressure continues to be elevated need to be seen sooner. Told her blood pressure goal is to be 130/90 or lower. Pt verbalized understanding.

## 2023-03-11 DIAGNOSIS — C20 Malignant neoplasm of rectum: Secondary | ICD-10-CM | POA: Diagnosis not present

## 2023-03-11 DIAGNOSIS — Z510 Encounter for antineoplastic radiation therapy: Secondary | ICD-10-CM | POA: Diagnosis not present

## 2023-03-17 DIAGNOSIS — Z923 Personal history of irradiation: Secondary | ICD-10-CM | POA: Diagnosis not present

## 2023-03-17 DIAGNOSIS — Z510 Encounter for antineoplastic radiation therapy: Secondary | ICD-10-CM | POA: Diagnosis not present

## 2023-03-17 DIAGNOSIS — C20 Malignant neoplasm of rectum: Secondary | ICD-10-CM | POA: Diagnosis not present

## 2023-03-19 DIAGNOSIS — K621 Rectal polyp: Secondary | ICD-10-CM | POA: Diagnosis not present

## 2023-03-29 DIAGNOSIS — Z510 Encounter for antineoplastic radiation therapy: Secondary | ICD-10-CM | POA: Diagnosis not present

## 2023-03-29 DIAGNOSIS — C20 Malignant neoplasm of rectum: Secondary | ICD-10-CM | POA: Diagnosis not present

## 2023-04-02 DIAGNOSIS — C20 Malignant neoplasm of rectum: Secondary | ICD-10-CM | POA: Diagnosis not present

## 2023-04-02 DIAGNOSIS — Z510 Encounter for antineoplastic radiation therapy: Secondary | ICD-10-CM | POA: Diagnosis not present

## 2023-04-08 DIAGNOSIS — C20 Malignant neoplasm of rectum: Secondary | ICD-10-CM | POA: Diagnosis not present

## 2023-04-22 ENCOUNTER — Other Ambulatory Visit: Payer: Self-pay | Admitting: Physician Assistant

## 2023-05-11 DIAGNOSIS — Z01810 Encounter for preprocedural cardiovascular examination: Secondary | ICD-10-CM | POA: Diagnosis not present

## 2023-05-11 DIAGNOSIS — C20 Malignant neoplasm of rectum: Secondary | ICD-10-CM | POA: Diagnosis not present

## 2023-05-11 DIAGNOSIS — E785 Hyperlipidemia, unspecified: Secondary | ICD-10-CM | POA: Diagnosis not present

## 2023-05-11 DIAGNOSIS — F419 Anxiety disorder, unspecified: Secondary | ICD-10-CM | POA: Diagnosis not present

## 2023-05-11 DIAGNOSIS — Z79899 Other long term (current) drug therapy: Secondary | ICD-10-CM | POA: Diagnosis not present

## 2023-05-11 DIAGNOSIS — D649 Anemia, unspecified: Secondary | ICD-10-CM | POA: Diagnosis not present

## 2023-05-11 DIAGNOSIS — Z01818 Encounter for other preprocedural examination: Secondary | ICD-10-CM | POA: Diagnosis not present

## 2023-05-11 DIAGNOSIS — Z01812 Encounter for preprocedural laboratory examination: Secondary | ICD-10-CM | POA: Diagnosis not present

## 2023-05-11 DIAGNOSIS — Z8673 Personal history of transient ischemic attack (TIA), and cerebral infarction without residual deficits: Secondary | ICD-10-CM | POA: Diagnosis not present

## 2023-05-11 DIAGNOSIS — I10 Essential (primary) hypertension: Secondary | ICD-10-CM | POA: Diagnosis not present

## 2023-05-11 DIAGNOSIS — I493 Ventricular premature depolarization: Secondary | ICD-10-CM | POA: Diagnosis not present

## 2023-05-11 DIAGNOSIS — Z7902 Long term (current) use of antithrombotics/antiplatelets: Secondary | ICD-10-CM | POA: Diagnosis not present

## 2023-05-11 DIAGNOSIS — K6289 Other specified diseases of anus and rectum: Secondary | ICD-10-CM | POA: Diagnosis not present

## 2023-05-11 DIAGNOSIS — Z7983 Long term (current) use of bisphosphonates: Secondary | ICD-10-CM | POA: Diagnosis not present

## 2023-05-11 DIAGNOSIS — R000 Tachycardia, unspecified: Secondary | ICD-10-CM | POA: Diagnosis not present

## 2023-05-11 DIAGNOSIS — F429 Obsessive-compulsive disorder, unspecified: Secondary | ICD-10-CM | POA: Diagnosis not present

## 2023-05-14 ENCOUNTER — Telehealth: Payer: Self-pay | Admitting: *Deleted

## 2023-05-14 MED ORDER — LOSARTAN POTASSIUM 100 MG PO TABS: 100.0000 mg | ORAL_TABLET | Freq: Every day | ORAL | 1 refills | Status: AC

## 2023-05-14 NOTE — Telephone Encounter (Signed)
 Copied from CRM [id number]. Topic: Clinical - Prescription Issue >> May 14, 2023 11:42 AM [name] wrote: Reason for CRM: patient currently on losartan  (COZAAR ) 50 MG tablet due to a hospital visit back in [place]. As per patient she's suppose to be on 100mg  and it was never changed. Patient needs medication dosage changed to 100 and sent to pharmacy

## 2023-05-14 NOTE — Telephone Encounter (Signed)
 Spoke to pt clarified what pharmacy. Pt said [name]. Told her I will send Rx for Losartan  100 mg daily to the pharmacy. Pt verbalized understanding. Rx sent

## 2023-05-23 DIAGNOSIS — Z8673 Personal history of transient ischemic attack (TIA), and cerebral infarction without residual deficits: Secondary | ICD-10-CM | POA: Diagnosis not present

## 2023-05-23 DIAGNOSIS — C20 Malignant neoplasm of rectum: Secondary | ICD-10-CM | POA: Diagnosis not present

## 2023-05-23 DIAGNOSIS — Z90710 Acquired absence of both cervix and uterus: Secondary | ICD-10-CM | POA: Diagnosis not present

## 2023-05-23 DIAGNOSIS — M6281 Muscle weakness (generalized): Secondary | ICD-10-CM | POA: Diagnosis not present

## 2023-05-23 DIAGNOSIS — Z7185 Encounter for immunization safety counseling: Secondary | ICD-10-CM | POA: Diagnosis not present

## 2023-05-23 DIAGNOSIS — Z7902 Long term (current) use of antithrombotics/antiplatelets: Secondary | ICD-10-CM | POA: Diagnosis not present

## 2023-05-23 DIAGNOSIS — K5790 Diverticulosis of intestine, part unspecified, without perforation or abscess without bleeding: Secondary | ICD-10-CM | POA: Diagnosis not present

## 2023-05-23 DIAGNOSIS — F419 Anxiety disorder, unspecified: Secondary | ICD-10-CM | POA: Diagnosis not present

## 2023-05-23 DIAGNOSIS — Z23 Encounter for immunization: Secondary | ICD-10-CM | POA: Diagnosis not present

## 2023-05-23 DIAGNOSIS — Z8719 Personal history of other diseases of the digestive system: Secondary | ICD-10-CM | POA: Diagnosis not present

## 2023-05-23 DIAGNOSIS — F32A Depression, unspecified: Secondary | ICD-10-CM | POA: Diagnosis not present

## 2023-05-23 DIAGNOSIS — I10 Essential (primary) hypertension: Secondary | ICD-10-CM | POA: Diagnosis not present

## 2023-05-23 DIAGNOSIS — Z9049 Acquired absence of other specified parts of digestive tract: Secondary | ICD-10-CM | POA: Diagnosis not present

## 2023-05-23 DIAGNOSIS — M810 Age-related osteoporosis without current pathological fracture: Secondary | ICD-10-CM | POA: Diagnosis not present

## 2023-05-23 DIAGNOSIS — Z923 Personal history of irradiation: Secondary | ICD-10-CM | POA: Diagnosis not present

## 2023-05-23 DIAGNOSIS — D649 Anemia, unspecified: Secondary | ICD-10-CM | POA: Diagnosis not present

## 2023-05-23 DIAGNOSIS — G629 Polyneuropathy, unspecified: Secondary | ICD-10-CM | POA: Diagnosis not present

## 2023-05-23 DIAGNOSIS — Z981 Arthrodesis status: Secondary | ICD-10-CM | POA: Diagnosis not present

## 2023-05-23 DIAGNOSIS — M4310 Spondylolisthesis, site unspecified: Secondary | ICD-10-CM | POA: Diagnosis not present

## 2023-05-23 DIAGNOSIS — Z9889 Other specified postprocedural states: Secondary | ICD-10-CM | POA: Diagnosis not present

## 2023-05-23 DIAGNOSIS — R2681 Unsteadiness on feet: Secondary | ICD-10-CM | POA: Diagnosis not present

## 2023-05-23 DIAGNOSIS — Z888 Allergy status to other drugs, medicaments and biological substances status: Secondary | ICD-10-CM | POA: Diagnosis not present

## 2023-05-23 DIAGNOSIS — Z79899 Other long term (current) drug therapy: Secondary | ICD-10-CM | POA: Diagnosis not present

## 2023-05-26 DIAGNOSIS — C20 Malignant neoplasm of rectum: Secondary | ICD-10-CM | POA: Diagnosis not present

## 2023-06-04 DIAGNOSIS — M6281 Muscle weakness (generalized): Secondary | ICD-10-CM | POA: Diagnosis not present

## 2023-06-04 DIAGNOSIS — R2681 Unsteadiness on feet: Secondary | ICD-10-CM | POA: Diagnosis not present

## 2023-06-04 DIAGNOSIS — K921 Melena: Secondary | ICD-10-CM | POA: Diagnosis not present

## 2023-06-04 DIAGNOSIS — C20 Malignant neoplasm of rectum: Secondary | ICD-10-CM | POA: Diagnosis not present

## 2023-06-04 DIAGNOSIS — Z79899 Other long term (current) drug therapy: Secondary | ICD-10-CM | POA: Diagnosis not present

## 2023-06-04 DIAGNOSIS — F32A Depression, unspecified: Secondary | ICD-10-CM | POA: Diagnosis not present

## 2023-06-04 DIAGNOSIS — Z7185 Encounter for immunization safety counseling: Secondary | ICD-10-CM | POA: Diagnosis not present

## 2023-06-04 DIAGNOSIS — Z433 Encounter for attention to colostomy: Secondary | ICD-10-CM | POA: Diagnosis not present

## 2023-06-04 DIAGNOSIS — K9401 Colostomy hemorrhage: Secondary | ICD-10-CM | POA: Diagnosis not present

## 2023-06-04 DIAGNOSIS — D649 Anemia, unspecified: Secondary | ICD-10-CM | POA: Diagnosis not present

## 2023-06-04 DIAGNOSIS — I10 Essential (primary) hypertension: Secondary | ICD-10-CM | POA: Diagnosis not present

## 2023-06-04 DIAGNOSIS — K922 Gastrointestinal hemorrhage, unspecified: Secondary | ICD-10-CM | POA: Diagnosis not present

## 2023-06-04 DIAGNOSIS — R7989 Other specified abnormal findings of blood chemistry: Secondary | ICD-10-CM | POA: Diagnosis not present

## 2023-06-04 DIAGNOSIS — Z23 Encounter for immunization: Secondary | ICD-10-CM | POA: Diagnosis not present

## 2023-06-04 DIAGNOSIS — Z7902 Long term (current) use of antithrombotics/antiplatelets: Secondary | ICD-10-CM | POA: Diagnosis not present

## 2023-06-04 DIAGNOSIS — M4310 Spondylolisthesis, site unspecified: Secondary | ICD-10-CM | POA: Diagnosis not present

## 2023-06-04 DIAGNOSIS — Z9049 Acquired absence of other specified parts of digestive tract: Secondary | ICD-10-CM | POA: Diagnosis not present

## 2023-06-04 DIAGNOSIS — Z933 Colostomy status: Secondary | ICD-10-CM | POA: Diagnosis not present

## 2023-06-04 DIAGNOSIS — R58 Hemorrhage, not elsewhere classified: Secondary | ICD-10-CM | POA: Diagnosis not present

## 2023-06-04 DIAGNOSIS — I959 Hypotension, unspecified: Secondary | ICD-10-CM | POA: Diagnosis not present

## 2023-06-04 DIAGNOSIS — R2689 Other abnormalities of gait and mobility: Secondary | ICD-10-CM | POA: Diagnosis not present

## 2023-06-04 DIAGNOSIS — Z48815 Encounter for surgical aftercare following surgery on the digestive system: Secondary | ICD-10-CM | POA: Diagnosis not present

## 2023-06-04 DIAGNOSIS — M810 Age-related osteoporosis without current pathological fracture: Secondary | ICD-10-CM | POA: Diagnosis not present

## 2023-06-04 DIAGNOSIS — Z9889 Other specified postprocedural states: Secondary | ICD-10-CM | POA: Diagnosis not present

## 2023-06-06 DIAGNOSIS — R2689 Other abnormalities of gait and mobility: Secondary | ICD-10-CM | POA: Diagnosis not present

## 2023-06-06 DIAGNOSIS — M6281 Muscle weakness (generalized): Secondary | ICD-10-CM | POA: Diagnosis not present

## 2023-06-06 DIAGNOSIS — Z933 Colostomy status: Secondary | ICD-10-CM | POA: Diagnosis not present

## 2023-06-06 DIAGNOSIS — Z48815 Encounter for surgical aftercare following surgery on the digestive system: Secondary | ICD-10-CM | POA: Diagnosis not present

## 2023-06-06 DIAGNOSIS — F32A Depression, unspecified: Secondary | ICD-10-CM | POA: Diagnosis not present

## 2023-06-08 DIAGNOSIS — I10 Essential (primary) hypertension: Secondary | ICD-10-CM | POA: Diagnosis not present

## 2023-06-08 DIAGNOSIS — C20 Malignant neoplasm of rectum: Secondary | ICD-10-CM | POA: Diagnosis not present

## 2023-06-08 DIAGNOSIS — Z933 Colostomy status: Secondary | ICD-10-CM | POA: Diagnosis not present

## 2023-06-10 DIAGNOSIS — I10 Essential (primary) hypertension: Secondary | ICD-10-CM | POA: Diagnosis not present

## 2023-06-10 DIAGNOSIS — C20 Malignant neoplasm of rectum: Secondary | ICD-10-CM | POA: Diagnosis not present

## 2023-06-10 DIAGNOSIS — Z933 Colostomy status: Secondary | ICD-10-CM | POA: Diagnosis not present

## 2023-06-11 DIAGNOSIS — Z48815 Encounter for surgical aftercare following surgery on the digestive system: Secondary | ICD-10-CM | POA: Diagnosis not present

## 2023-06-11 DIAGNOSIS — Z9049 Acquired absence of other specified parts of digestive tract: Secondary | ICD-10-CM | POA: Diagnosis not present

## 2023-06-11 DIAGNOSIS — K922 Gastrointestinal hemorrhage, unspecified: Secondary | ICD-10-CM | POA: Diagnosis not present

## 2023-06-11 DIAGNOSIS — K9401 Colostomy hemorrhage: Secondary | ICD-10-CM | POA: Diagnosis not present

## 2023-06-11 DIAGNOSIS — Z433 Encounter for attention to colostomy: Secondary | ICD-10-CM | POA: Diagnosis not present

## 2023-06-11 DIAGNOSIS — Z933 Colostomy status: Secondary | ICD-10-CM | POA: Diagnosis not present

## 2023-06-11 DIAGNOSIS — Z9889 Other specified postprocedural states: Secondary | ICD-10-CM | POA: Diagnosis not present

## 2023-06-11 DIAGNOSIS — I10 Essential (primary) hypertension: Secondary | ICD-10-CM | POA: Diagnosis not present

## 2023-06-11 DIAGNOSIS — R7989 Other specified abnormal findings of blood chemistry: Secondary | ICD-10-CM | POA: Diagnosis not present

## 2023-06-11 DIAGNOSIS — C20 Malignant neoplasm of rectum: Secondary | ICD-10-CM | POA: Diagnosis not present

## 2023-06-11 DIAGNOSIS — Z7902 Long term (current) use of antithrombotics/antiplatelets: Secondary | ICD-10-CM | POA: Diagnosis not present

## 2023-06-11 DIAGNOSIS — Z79899 Other long term (current) drug therapy: Secondary | ICD-10-CM | POA: Diagnosis not present

## 2023-06-11 DIAGNOSIS — K921 Melena: Secondary | ICD-10-CM | POA: Diagnosis not present

## 2023-06-13 DIAGNOSIS — Z9181 History of falling: Secondary | ICD-10-CM | POA: Diagnosis not present

## 2023-06-13 DIAGNOSIS — Z860100 Personal history of colon polyps, unspecified: Secondary | ICD-10-CM | POA: Diagnosis not present

## 2023-06-13 DIAGNOSIS — C20 Malignant neoplasm of rectum: Secondary | ICD-10-CM | POA: Diagnosis not present

## 2023-06-13 DIAGNOSIS — F419 Anxiety disorder, unspecified: Secondary | ICD-10-CM | POA: Diagnosis not present

## 2023-06-13 DIAGNOSIS — Z483 Aftercare following surgery for neoplasm: Secondary | ICD-10-CM | POA: Diagnosis not present

## 2023-06-13 DIAGNOSIS — Z433 Encounter for attention to colostomy: Secondary | ICD-10-CM | POA: Diagnosis not present

## 2023-06-13 DIAGNOSIS — Z602 Problems related to living alone: Secondary | ICD-10-CM | POA: Diagnosis not present

## 2023-06-13 DIAGNOSIS — Z7902 Long term (current) use of antithrombotics/antiplatelets: Secondary | ICD-10-CM | POA: Diagnosis not present

## 2023-06-13 DIAGNOSIS — Z981 Arthrodesis status: Secondary | ICD-10-CM | POA: Diagnosis not present

## 2023-06-13 DIAGNOSIS — M1990 Unspecified osteoarthritis, unspecified site: Secondary | ICD-10-CM | POA: Diagnosis not present

## 2023-06-13 DIAGNOSIS — F32A Depression, unspecified: Secondary | ICD-10-CM | POA: Diagnosis not present

## 2023-06-13 DIAGNOSIS — Z9049 Acquired absence of other specified parts of digestive tract: Secondary | ICD-10-CM | POA: Diagnosis not present

## 2023-06-13 DIAGNOSIS — G629 Polyneuropathy, unspecified: Secondary | ICD-10-CM | POA: Diagnosis not present

## 2023-06-13 DIAGNOSIS — D630 Anemia in neoplastic disease: Secondary | ICD-10-CM | POA: Diagnosis not present

## 2023-06-13 DIAGNOSIS — I69351 Hemiplegia and hemiparesis following cerebral infarction affecting right dominant side: Secondary | ICD-10-CM | POA: Diagnosis not present

## 2023-06-13 DIAGNOSIS — I10 Essential (primary) hypertension: Secondary | ICD-10-CM | POA: Diagnosis not present

## 2023-06-14 ENCOUNTER — Ambulatory Visit: Admitting: Physician Assistant

## 2023-06-16 DIAGNOSIS — Z433 Encounter for attention to colostomy: Secondary | ICD-10-CM | POA: Diagnosis not present

## 2023-06-16 DIAGNOSIS — Z7902 Long term (current) use of antithrombotics/antiplatelets: Secondary | ICD-10-CM | POA: Diagnosis not present

## 2023-06-16 DIAGNOSIS — K9401 Colostomy hemorrhage: Secondary | ICD-10-CM | POA: Diagnosis not present

## 2023-06-16 DIAGNOSIS — C189 Malignant neoplasm of colon, unspecified: Secondary | ICD-10-CM | POA: Diagnosis not present

## 2023-06-16 DIAGNOSIS — Z8673 Personal history of transient ischemic attack (TIA), and cerebral infarction without residual deficits: Secondary | ICD-10-CM | POA: Diagnosis not present

## 2023-06-17 ENCOUNTER — Inpatient Hospital Stay: Payer: Self-pay | Admitting: Physician Assistant

## 2023-06-18 ENCOUNTER — Telehealth: Payer: Self-pay | Admitting: Physician Assistant

## 2023-06-18 NOTE — Telephone Encounter (Signed)
 [facility] faxed document Home Health Certificate (Order ID [id number]), to be filled out by provider. Patient requested to send it back via Fax within ASAP. Document is located in providers tray at front office.Please advise at [phone].

## 2023-06-21 NOTE — Telephone Encounter (Signed)
 Documents signed and faxed to [facility].

## 2023-06-25 ENCOUNTER — Ambulatory Visit: Payer: Self-pay

## 2023-06-25 NOTE — Telephone Encounter (Signed)
 FYI Only or Action Required?: FYI only for provider  Patient was last seen in primary care on 01/01/2023 by Anthon Kins, MD. Called Nurse Triage reporting GI Problem. Symptoms began today. Interventions attempted: Nothing. Symptoms are: stable.  Triage Disposition: Go to ED Now (Notify PCP) See note   Patient/caregiver understands and will follow disposition?: Yes      Copied from CRM [id number]. Topic: Clinical - Red Word Triage >> Jun 25, 2023 12:50 PM Allyne Areola wrote: Red Word that prompted transfer to Nurse Triage: Melissa from [facility] is calling to advise patient's colostomy bag rupture over the night and she noticed bleeding bottom left of the stoma. Reason for Disposition  [1] Bleeding from stoma tissue (stoma is moist, pink to red-colored tissue) AND [2] won't stop after 10 minutes of direct pressure (using correct technique)  Answer Assessment - Initial Assessment Questions 1. TYPE: What type of ostomy do you have? (e.g., ileostomy, colostomy; temporary, long-term [permanent]).     ------------------------ Colostomy    2. LOCATION: Where is the ostomy located?     -------------------- Lower left quadrant     3. DURATION: How long have you had the ostomy? (e.g., days, weeks, months, years).     --------------------- May 12th    4. MAIN CONCERN: What is your main concern or symptom today? (e.g., skin redness or irritation, bleeding, leaking, change in output or appearance of ostomy).     --- Bleeding around the bottom of the stoma. Bag ruptured last night      7. OTHER SYMPTOMS: Are you having any other symptoms? (e.g., fever, vomiting, blood in stool, abdomen pain, dizziness)     ------------- Still actively bleeding now. It has slowed down since yesterday      Additional Information:  Patient noticed the bleeding prior to the rupture of the bag.  Patient recently restarted the plavix  6/11.  Patient decided to stop the Plavix  abruptly due  to the active bleeding from her stoma. Confirms they have been placing active pressure on it and its still actively bleeding.  Protocols used: Ostomy Symptoms and Questions-A-AH

## 2023-06-25 NOTE — Telephone Encounter (Signed)
 Noted

## 2023-06-28 DIAGNOSIS — Z433 Encounter for attention to colostomy: Secondary | ICD-10-CM | POA: Diagnosis not present

## 2023-06-30 ENCOUNTER — Telehealth: Payer: Self-pay | Admitting: Physician Assistant

## 2023-06-30 DIAGNOSIS — K9401 Colostomy hemorrhage: Secondary | ICD-10-CM | POA: Diagnosis not present

## 2023-06-30 NOTE — Telephone Encounter (Signed)
 Received faxed  document Home Health Certificate (Order ID [id number]), to be filled out by provider. Patient requested to send it back via Fax . Document is located in providers tray at front office.Please advise

## 2023-07-02 DIAGNOSIS — Z433 Encounter for attention to colostomy: Secondary | ICD-10-CM | POA: Diagnosis not present

## 2023-07-02 NOTE — Telephone Encounter (Signed)
 Documents were signed and faxed back.

## 2023-07-05 DIAGNOSIS — I10 Essential (primary) hypertension: Secondary | ICD-10-CM | POA: Diagnosis not present

## 2023-07-05 DIAGNOSIS — E785 Hyperlipidemia, unspecified: Secondary | ICD-10-CM | POA: Diagnosis not present

## 2023-07-05 DIAGNOSIS — Z7902 Long term (current) use of antithrombotics/antiplatelets: Secondary | ICD-10-CM | POA: Diagnosis not present

## 2023-07-05 DIAGNOSIS — I639 Cerebral infarction, unspecified: Secondary | ICD-10-CM | POA: Diagnosis not present

## 2023-07-05 DIAGNOSIS — Z8673 Personal history of transient ischemic attack (TIA), and cerebral infarction without residual deficits: Secondary | ICD-10-CM | POA: Diagnosis not present

## 2023-07-16 ENCOUNTER — Other Ambulatory Visit: Payer: Self-pay | Admitting: Family Medicine

## 2023-07-16 ENCOUNTER — Other Ambulatory Visit: Payer: Self-pay | Admitting: Physician Assistant

## 2023-07-19 NOTE — Telephone Encounter (Signed)
 Last OV 06/10/22 Next OV not scheduled  Last refill 02/25/23 Qty #270/3  Has not been seen in > 1 year, needs OV.

## 2023-07-27 DIAGNOSIS — K922 Gastrointestinal hemorrhage, unspecified: Secondary | ICD-10-CM | POA: Diagnosis not present

## 2023-08-02 ENCOUNTER — Other Ambulatory Visit: Payer: Self-pay | Admitting: Physician Assistant

## 2023-08-11 DIAGNOSIS — E782 Mixed hyperlipidemia: Secondary | ICD-10-CM | POA: Diagnosis not present

## 2023-08-11 DIAGNOSIS — R200 Anesthesia of skin: Secondary | ICD-10-CM | POA: Diagnosis not present

## 2023-08-11 DIAGNOSIS — F419 Anxiety disorder, unspecified: Secondary | ICD-10-CM | POA: Diagnosis not present

## 2023-08-11 DIAGNOSIS — D62 Acute posthemorrhagic anemia: Secondary | ICD-10-CM | POA: Diagnosis not present

## 2023-08-11 DIAGNOSIS — C20 Malignant neoplasm of rectum: Secondary | ICD-10-CM | POA: Diagnosis not present

## 2023-08-11 DIAGNOSIS — M810 Age-related osteoporosis without current pathological fracture: Secondary | ICD-10-CM | POA: Diagnosis not present

## 2023-08-11 DIAGNOSIS — I10 Essential (primary) hypertension: Secondary | ICD-10-CM | POA: Diagnosis not present

## 2023-08-11 DIAGNOSIS — Z8673 Personal history of transient ischemic attack (TIA), and cerebral infarction without residual deficits: Secondary | ICD-10-CM | POA: Diagnosis not present

## 2023-08-11 NOTE — Progress Notes (Signed)
 Patient ID:  [name] is a 83 y.o. (DOB 03-09-1940) female.  Assessment and Plan   1. Adenocarcinoma of rectum (*), s/p rob LAR w/ end colostomy   2. Age-related osteoporosis without current pathological fracture   3. Essential (primary) hypertension   4. Mixed hyperlipidemia   5. History of CVA (cerebrovascular accident)   6. Right leg numbness   7. Anxiety     Assessment & Plan 1. 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 Diagnostic plan: CBC today to monitor hemoglobin. Treatment plan: Restart Plavix ; follow-up with Dr. Raymond (Sept 2025) and Dr. Kelby as scheduled.  2. Repeat DEXA scan ordered.  Cont medications as prescribed.  3-4. Hypertension Well-controlled with current regimen.  Hyperlipidemia Cholesterol well-managed with current regimen.  5-6. Stroke CVA in 2018; prescribed Plavix  but discontinued after 3 days. Has been off for approx 10-12 days. Diagnostic plan: CBC today to ensure hemoglobin stability. Treatment plan: Restart Plavix  if hemoglobin stable; report significant bleeding to surgeon. Follow up with Neurology. Residual R leg numbness s/p CVA. Has been taking Gabapentin  for the numbness w/o relief. D/c Gabapentin  at this time and see how her sxs are.  7. Anxiety Well-managed with sertraline .     Results CBC: Hemoglobin increased from 7.7 to 8.6.   HM needs: DEXA  Patient understands and agrees with plan. Computer technology was used to create visit note. Consent from the patient/caregiver was obtained prior to use.  No follow-ups on file.    Patient's Medications       * Accurate as of August 11, 2023  2:08 PM. Reflects encounter med changes as of last refresh          Continued Medications      Instructions  alendronate  70 mg tablet Commonly known as: FOSAMAX   70 mg, Weekly at 0900   atorvastatin  40 mg tablet Commonly known as: LIPITOR  40 mg, Oral, Daily    clopidogrel  bisulfate 75 mg tablet Commonly known as: PLAVIX   75 mg, Daily   hydroCHLOROthiazide  12.5 mg tablet  12.5 mg, Daily   losartan  potassium 100 mg tablet Commonly known as: COZAAR   100 mg, Daily   sertraline  50 mg tablet Commonly known as: ZOLOFT   50 mg, Daily       Modified Medications      Instructions  gabapentin  100 mg capsule Commonly known as: NEURONTIN  What changed: Another medication with the same name was removed. Continue taking this medication, and follow the directions you see here. Changed by: Hoy Miu, PA-C  400 mg, Every morning       Discontinued Medications    CALCIUM /D3 ADULT GUMMIES PO Stopped by: Hoy Miu, PA-C        Orders Placed This Encounter  Procedures  . Dexa Bone Density Axial Skeleton W Extremity  . CBC And Differential    Risks, benefits, and alternatives of the medications and treatment plan prescribed today were discussed, and patient expressed understanding. Plan follow-up as discussed or as needed if any worsening symptoms or change in condition.    A yearly preventative health exam was recommended and current age based recommendations were discussed.   Subjective   Patient ID:  [name] is a 83 y.o. (DOB 1940-05-02) female    Patient presents with  . medcheck    Needs refills. Discuss gabapentin . Can her dose be increased so she can take less pills.  Is aware that she needs to contact neurology about her plavix .  Pt stated that she stopped it because she started bleeding around her colostomy bag but daughter-in-law reminded her that she needs to still be taking it and they will have to go back and see her neurologist to discuss the bleeding.      HPI:    History of Present Illness 83 year old female establishing care with PMH of rectal cancer, anxiety, degenerative disk disease, CVA, rectal mass, osteoporosis, hyperlipidemia, and hypertension. She is accompanied by her daughter in law.   Current medications: Fosamax , Lipitor 40 mg, gabapentin  100 mg (3-4x daily), HCTZ 12.5 mg, losartan  100 mg, sertraline  50 mg, and Plavix  (inconsistently taken).  Reports good health with home healthcare services for physical and occupational therapy post-hospitalization. In 12/2022, colonoscopy revealed a polyp and mass, confirmed as stage II-III rectal cancer after surgical removal and biopsy. Colostomy bag placed; stoma healing ongoing with concerns of unnoticed bleeding possibly due to Plavix . No upcoming appointments with surgeon or neurologist; hemoglobin levels stable.  Experiences numbness in feet and legs, more pronounced in the right foot, possible residual from stroke. Reports no pain but persistent numbness on top of foot. Gabapentin  ineffective.  Anxiety well-managed with Zoloft .  Blood pressure and cholesterol medications effective.  Due for bone density scan.  PAST SURGICAL HISTORY: - Rectal mass removal with colostomy bag placement (12/2022) - Lumbar radiculitis injection (date unspecified)    Past Medical History, Past Surgery History, Allergies, Social History, and Family History were reviewed and updated.    Review of Systems  Constitutional: Negative.   HENT: Negative.    Respiratory: Negative.    Cardiovascular: Negative.   Gastrointestinal: Negative.   Musculoskeletal: Negative.   Neurological:  Positive for numbness (R leg).  All other systems reviewed and are negative.    Objective   Vitals:   08/11/23 1041  BP: 124/60  Patient Position: Sitting  Pulse: 96  Temp: 97.1 F (36.2 C)  TempSrc: Skin  Resp: 18  Height: 5' 1 (1.549 m)  Weight: 141 lb 12.8 oz (64.3 kg)  SpO2: 95%  BMI (Calculated): 26.8  PainSc: 0-No pain      Physical Exam Vitals and nursing note reviewed.  Constitutional:      General: She is not in acute distress.    Appearance: Normal appearance.  HENT:     Head: Normocephalic and atraumatic.     Nose: Nose normal.    Eyes:     Extraocular Movements: Extraocular movements intact.     Pupils: Pupils are equal, round, and reactive to light.    Cardiovascular:     Rate and Rhythm: Normal rate and regular rhythm.   Musculoskeletal:        General: Normal range of motion.     Cervical back: Normal range of motion.  Pulmonary:     Effort: Pulmonary effort is normal.     Breath sounds: Normal breath sounds.  Abdominal:     General: Abdomen is flat.     Palpations: Abdomen is soft.     Comments: Colostomy bag LLQ, no surrounding cellulitis or warmth to palpation   Skin:    General: Skin is warm and dry.  Neurological:     General: No focal deficit present.     Mental Status: She is alert and oriented to person, place, and time.       Voice recognition software was used and creation of this note. Despite my best effort at editing the text, some misspelling/errors may have occurred. *Some images could not be shown.

## 2023-08-14 ENCOUNTER — Other Ambulatory Visit: Payer: Self-pay | Admitting: Family Medicine

## 2023-08-14 ENCOUNTER — Other Ambulatory Visit: Payer: Self-pay | Admitting: Physician Assistant

## 2023-08-16 DIAGNOSIS — Z433 Encounter for attention to colostomy: Secondary | ICD-10-CM | POA: Diagnosis not present

## 2023-08-16 NOTE — Telephone Encounter (Signed)
 Refill denied. Pt has not been seen in over a year. Advise to make a f/u appointment

## 2023-09-23 DIAGNOSIS — Z433 Encounter for attention to colostomy: Secondary | ICD-10-CM | POA: Diagnosis not present

## 2023-10-26 DIAGNOSIS — Z433 Encounter for attention to colostomy: Secondary | ICD-10-CM | POA: Diagnosis not present

## 2023-11-11 DIAGNOSIS — M21932 Unspecified acquired deformity of left forearm: Secondary | ICD-10-CM | POA: Diagnosis not present

## 2023-11-11 DIAGNOSIS — W19XXXA Unspecified fall, initial encounter: Secondary | ICD-10-CM | POA: Diagnosis not present

## 2023-11-11 DIAGNOSIS — Z7901 Long term (current) use of anticoagulants: Secondary | ICD-10-CM | POA: Diagnosis not present

## 2023-11-11 DIAGNOSIS — M25532 Pain in left wrist: Secondary | ICD-10-CM | POA: Diagnosis not present

## 2023-11-11 DIAGNOSIS — S52502A Unspecified fracture of the lower end of left radius, initial encounter for closed fracture: Secondary | ICD-10-CM | POA: Diagnosis not present

## 2023-11-17 DIAGNOSIS — S52552A Other extraarticular fracture of lower end of left radius, initial encounter for closed fracture: Secondary | ICD-10-CM | POA: Diagnosis not present

## 2023-11-17 DIAGNOSIS — X58XXXA Exposure to other specified factors, initial encounter: Secondary | ICD-10-CM | POA: Diagnosis not present

## 2023-11-17 DIAGNOSIS — Y999 Unspecified external cause status: Secondary | ICD-10-CM | POA: Diagnosis not present

## 2023-11-17 DIAGNOSIS — S52532A Colles' fracture of left radius, initial encounter for closed fracture: Secondary | ICD-10-CM | POA: Diagnosis not present

## 2023-11-24 DIAGNOSIS — S52532D Colles' fracture of left radius, subsequent encounter for closed fracture with routine healing: Secondary | ICD-10-CM | POA: Diagnosis not present

## 2023-11-24 DIAGNOSIS — M25532 Pain in left wrist: Secondary | ICD-10-CM | POA: Diagnosis not present

## 2023-12-02 DIAGNOSIS — Z433 Encounter for attention to colostomy: Secondary | ICD-10-CM | POA: Diagnosis not present

## 2023-12-15 DIAGNOSIS — H6123 Impacted cerumen, bilateral: Secondary | ICD-10-CM | POA: Diagnosis not present

## 2023-12-15 DIAGNOSIS — M810 Age-related osteoporosis without current pathological fracture: Secondary | ICD-10-CM | POA: Diagnosis not present

## 2023-12-15 DIAGNOSIS — R2689 Other abnormalities of gait and mobility: Secondary | ICD-10-CM | POA: Diagnosis not present

## 2023-12-15 DIAGNOSIS — G629 Polyneuropathy, unspecified: Secondary | ICD-10-CM | POA: Diagnosis not present

## 2023-12-15 DIAGNOSIS — Z8673 Personal history of transient ischemic attack (TIA), and cerebral infarction without residual deficits: Secondary | ICD-10-CM | POA: Diagnosis not present

## 2023-12-15 DIAGNOSIS — E782 Mixed hyperlipidemia: Secondary | ICD-10-CM | POA: Diagnosis not present

## 2023-12-15 DIAGNOSIS — I10 Essential (primary) hypertension: Secondary | ICD-10-CM | POA: Diagnosis not present

## 2023-12-15 DIAGNOSIS — R200 Anesthesia of skin: Secondary | ICD-10-CM | POA: Diagnosis not present

## 2023-12-15 DIAGNOSIS — Z0000 Encounter for general adult medical examination without abnormal findings: Secondary | ICD-10-CM | POA: Diagnosis not present
